# Patient Record
Sex: Female | Born: 1955 | Race: Black or African American | Hispanic: No | Marital: Married | Smoking: Former smoker
Health system: Southern US, Community
[De-identification: ages and names within clinical notes are randomized; demographics above are authoritative.]

## PROBLEM LIST (undated history)

## (undated) DIAGNOSIS — J449 Chronic obstructive pulmonary disease, unspecified: Secondary | ICD-10-CM

## (undated) DIAGNOSIS — J302 Other seasonal allergic rhinitis: Secondary | ICD-10-CM

## (undated) DIAGNOSIS — Z820 Family history of epilepsy and other diseases of the nervous system: Secondary | ICD-10-CM

## (undated) DIAGNOSIS — T783XXA Angioneurotic edema, initial encounter: Secondary | ICD-10-CM

## (undated) DIAGNOSIS — I10 Essential (primary) hypertension: Secondary | ICD-10-CM

## (undated) DIAGNOSIS — E785 Hyperlipidemia, unspecified: Secondary | ICD-10-CM

## (undated) HISTORY — DX: Hyperlipidemia, unspecified: E78.5

## (undated) HISTORY — DX: Chronic obstructive pulmonary disease, unspecified: J44.9

## (undated) HISTORY — DX: Other seasonal allergic rhinitis: J30.2

## (undated) HISTORY — DX: Family history of epilepsy and other diseases of the nervous system: Z82.0

## (undated) HISTORY — DX: Essential (primary) hypertension: I10

## (undated) HISTORY — DX: Angioneurotic edema, initial encounter: T78.3XXA

---

## 2007-01-09 IMAGING — CR DG CHEST 2V
2 series · 2 of 2 positions shown · non-contrast
Comparison: None

CLINICAL DATA: Pre-op for cervical fusion.
 CHEST - 2 VIEW:

[view not recorded (1 of 2)]
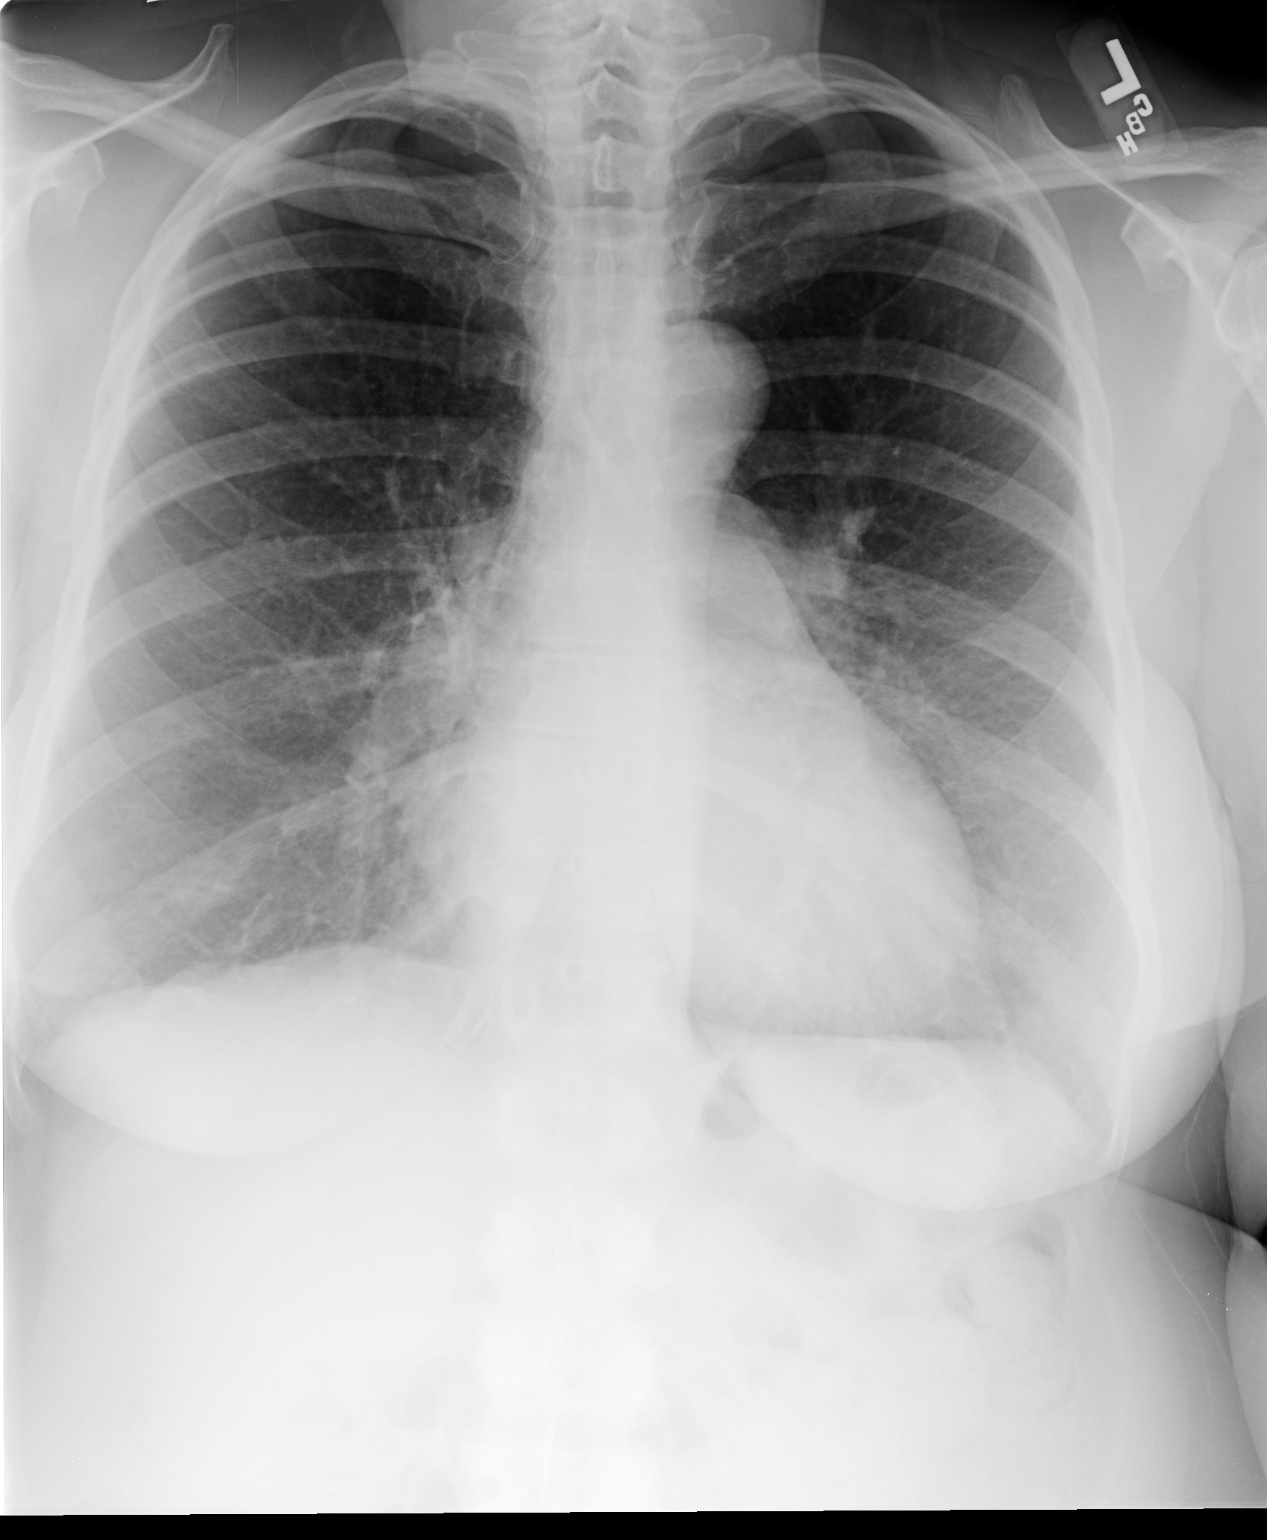

[view not recorded (2 of 2)]
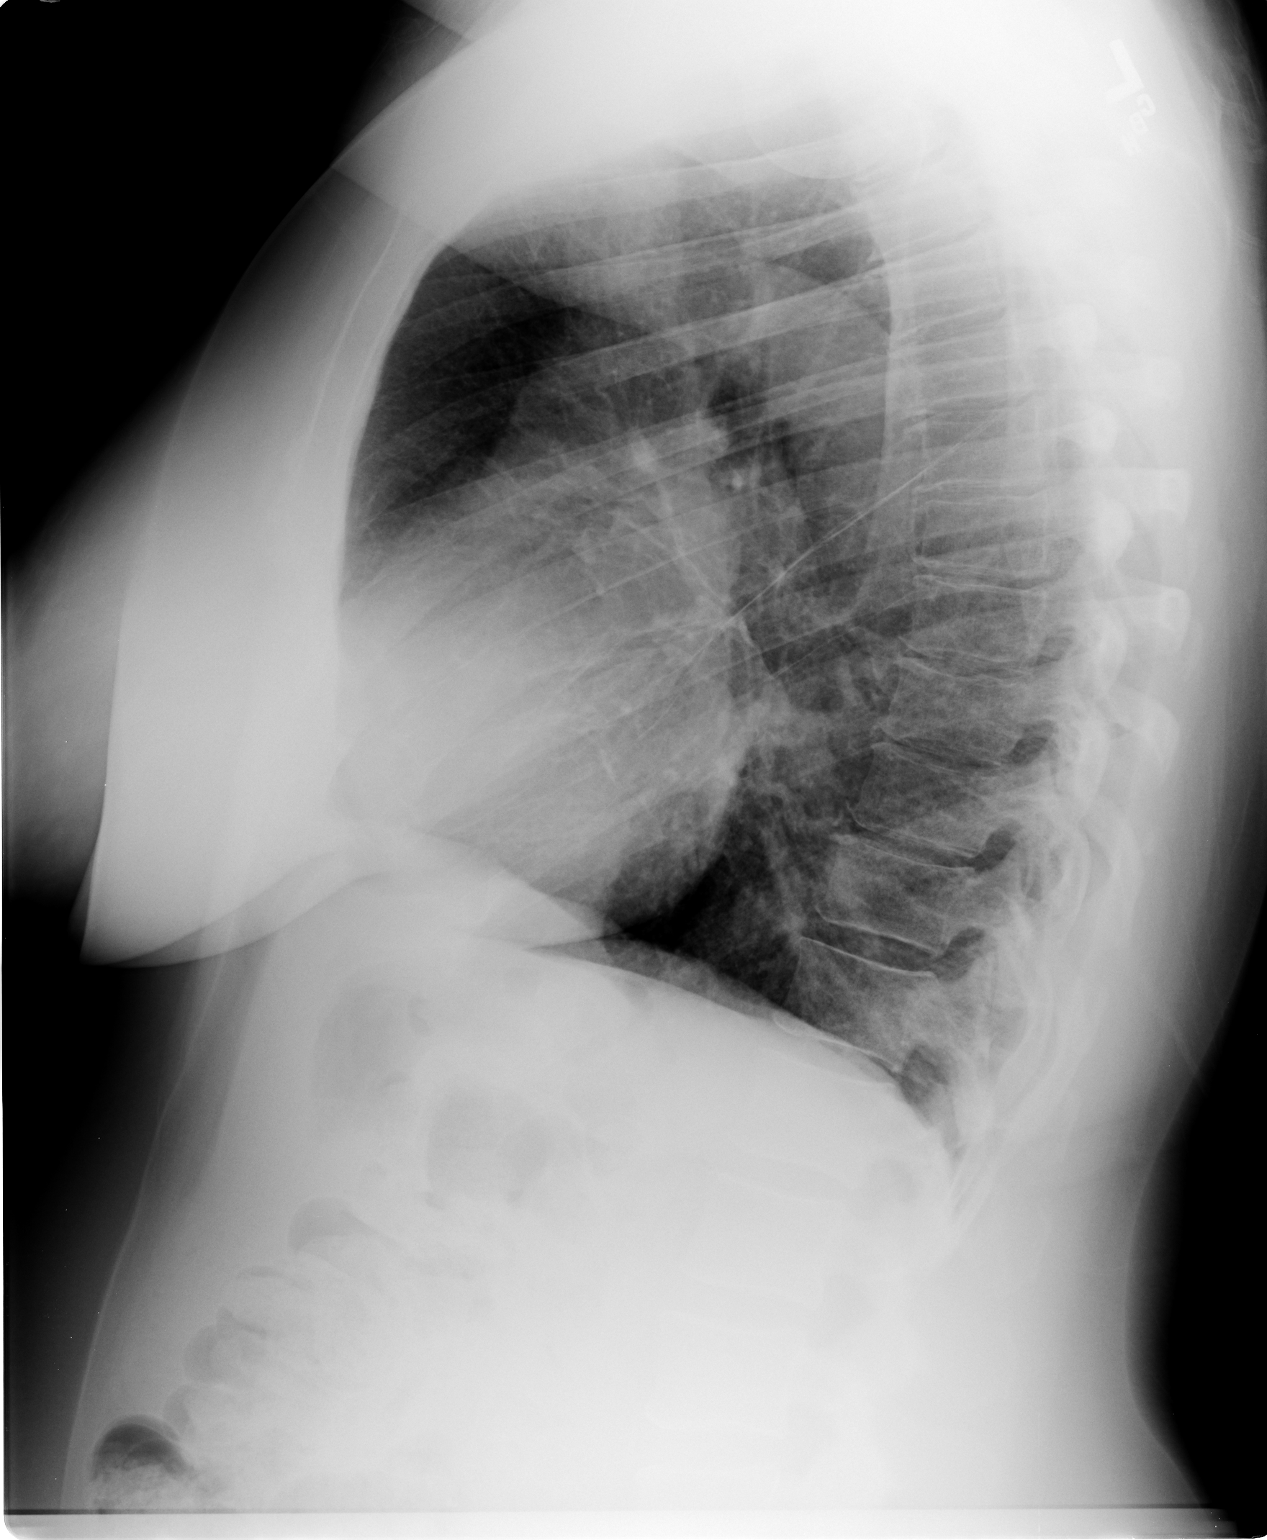

[2 of 2 positions shown; findings below may reference images not displayed]

FINDINGS: Heart size upper limits of normal. Mildly accentuated interstitial markings and mild hyperaeration.  No acute process.  
 Osseous structures intact.
IMPRESSION: 1.  Heart size upper limits of normal. 
 2.  Chronic lung changes - no acute process.

## 2007-01-11 ENCOUNTER — Inpatient Hospital Stay (HOSPITAL_COMMUNITY): Admission: RE | Admit: 2007-01-11 | Discharge: 2007-01-13 | Payer: Self-pay | Admitting: Neurosurgery

## 2007-01-11 IMAGING — CR DG CERVICAL SPINE 2 OR 3 VIEWS
1 series · 1 of 1 positions shown · non-contrast
Comparison: none

CLINICAL DATA: C4-5 ACDF.
 PORTABLE CERVICAL SPINE ? 2 VIEWS:
 Film # 1 reveals an instrument at the C4-5 interspace.  
 Film # 2 reveals an anterior plate at C4-5 with interbody bone plug at C4-5 in good position.

[view not recorded]
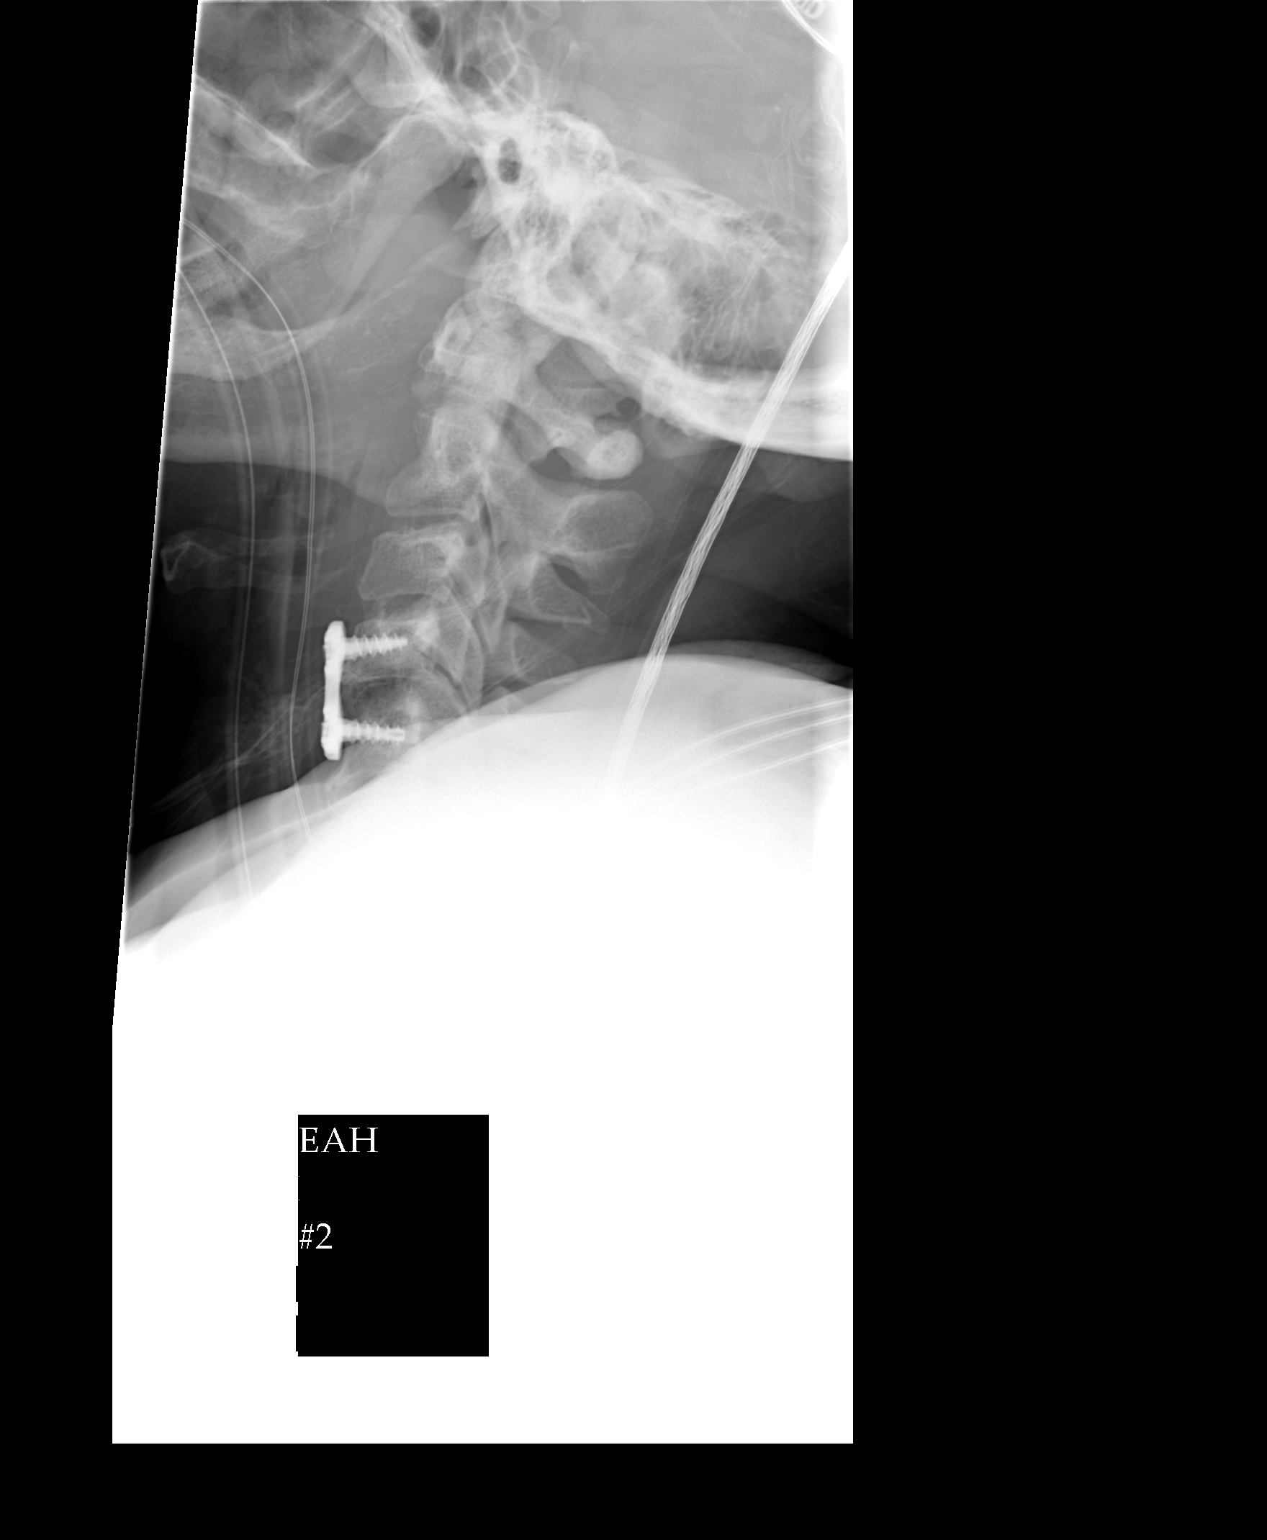

[1 of 1 positions shown; findings below may reference images not displayed]

IMPRESSION: Anterior discectomy and fusion at C4-5.

## 2007-01-11 IMAGING — CR DG CERVICAL SPINE 2 OR 3 VIEWS
1 series · 1 of 1 positions shown · non-contrast
Comparison: none

CLINICAL DATA: C4-5 ACDF.
 PORTABLE CERVICAL SPINE ? 2 VIEWS:
 Film # 1 reveals an instrument at the C4-5 interspace.  
 Film # 2 reveals an anterior plate at C4-5 with interbody bone plug at C4-5 in good position.

[view not recorded]
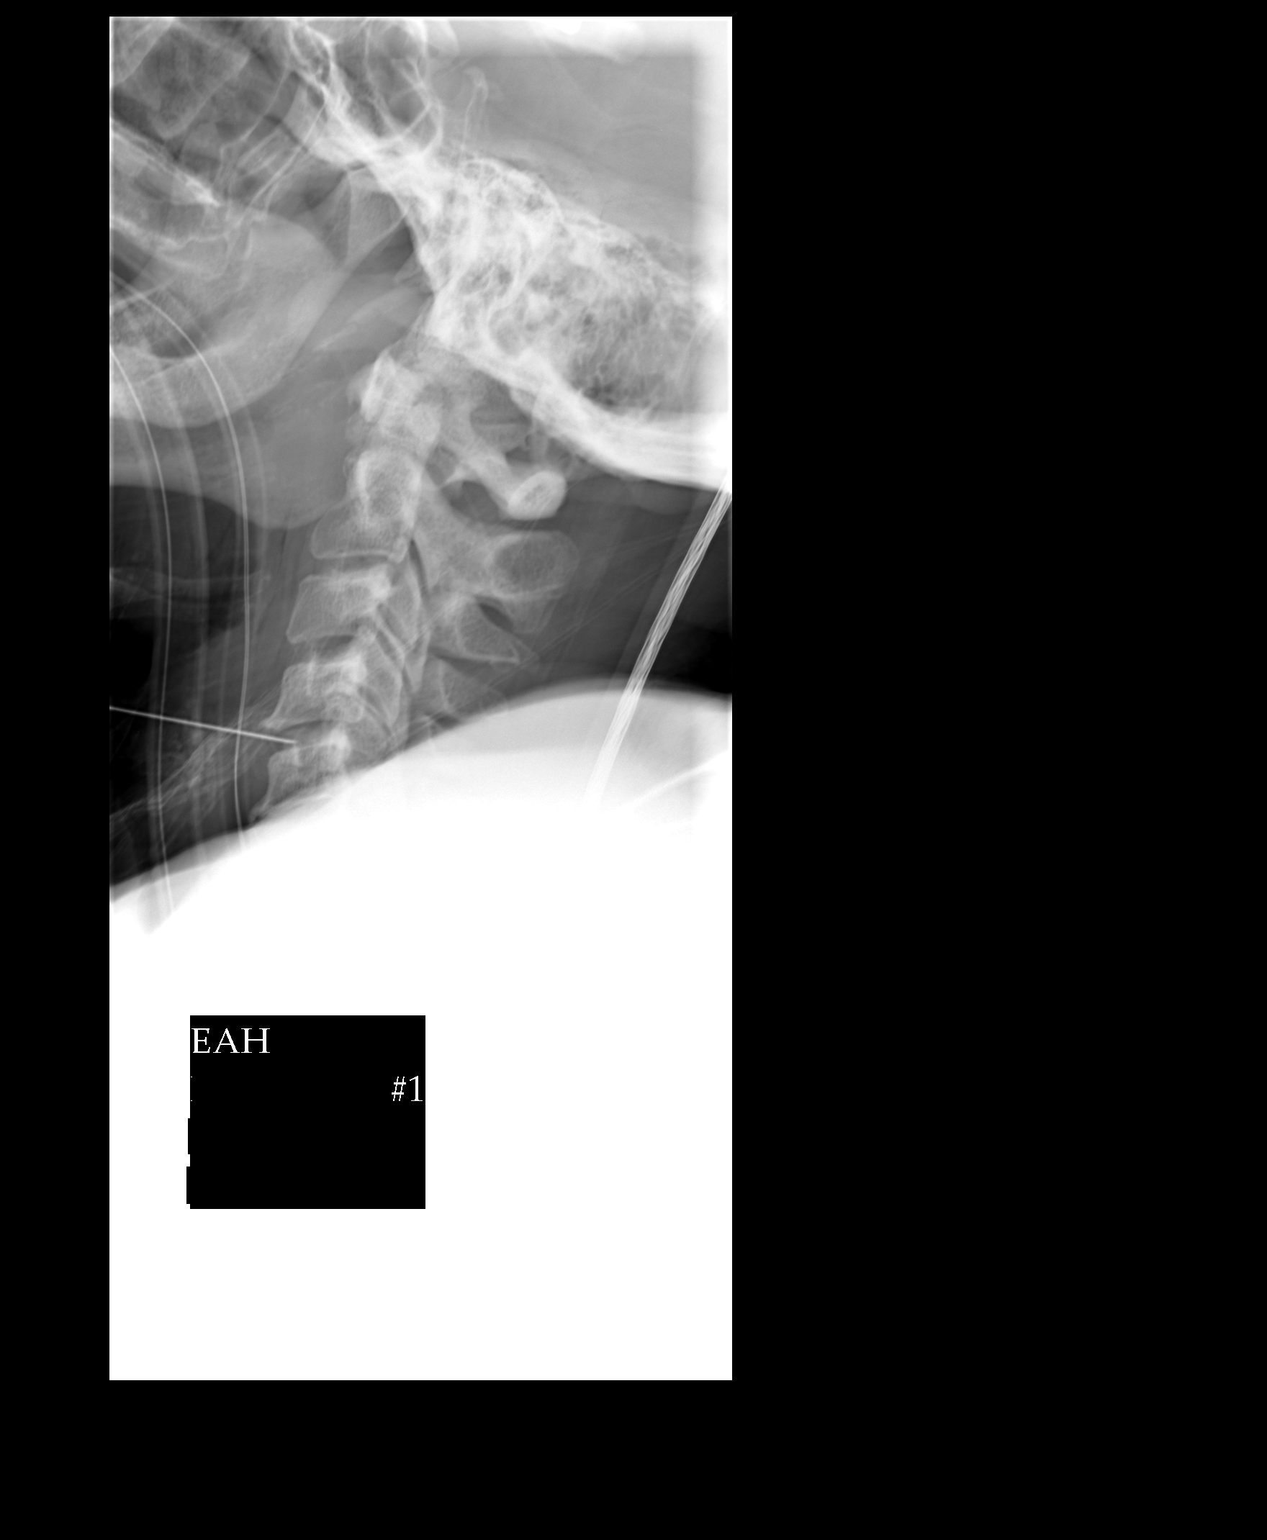

[1 of 1 positions shown; findings below may reference images not displayed]

IMPRESSION: Anterior discectomy and fusion at C4-5.

## 2014-01-11 ENCOUNTER — Encounter: Payer: Self-pay | Admitting: *Deleted

## 2014-01-11 ENCOUNTER — Encounter: Payer: Self-pay | Admitting: Emergency Medicine

## 2014-01-14 ENCOUNTER — Institutional Professional Consult (permissible substitution): Payer: Self-pay | Admitting: Emergency Medicine

## 2014-01-27 ENCOUNTER — Encounter: Payer: Self-pay | Admitting: Emergency Medicine

## 2014-01-27 ENCOUNTER — Ambulatory Visit (INDEPENDENT_AMBULATORY_CARE_PROVIDER_SITE_OTHER): Payer: Commercial Managed Care - PPO | Admitting: Emergency Medicine

## 2014-01-27 VITALS — BP 128/78 | HR 76 | Ht 69.0 in | Wt 212.0 lb

## 2014-01-27 DIAGNOSIS — R0602 Shortness of breath: Secondary | ICD-10-CM

## 2014-01-27 DIAGNOSIS — IMO0002 Reserved for concepts with insufficient information to code with codable children: Secondary | ICD-10-CM

## 2014-01-27 DIAGNOSIS — I2789 Other specified pulmonary heart diseases: Secondary | ICD-10-CM

## 2014-01-27 DIAGNOSIS — I2721 Secondary pulmonary arterial hypertension: Secondary | ICD-10-CM | POA: Insufficient documentation

## 2014-01-27 DIAGNOSIS — J449 Chronic obstructive pulmonary disease, unspecified: Secondary | ICD-10-CM | POA: Insufficient documentation

## 2014-01-27 DIAGNOSIS — R0902 Hypoxemia: Secondary | ICD-10-CM

## 2014-01-27 MED ORDER — AEROCHAMBER MV MISC: Status: DC

## 2014-01-27 NOTE — Assessment & Plan Note (Addendum)
By PFT and with emphysematous changes on CT scan. She has had trouble tolerating BD's due to UA irritation. Will try a spacer with Symbicort. Also try Spiriva with good teaching on how to use. Will walk her today and get her a more portable system. rov 1 - BD as above - walking oximetry - repeat imaging and PFT in the future - rov1

## 2014-01-27 NOTE — Addendum Note (Signed)
Addended by: Carlos American A on: 01/27/2014 04:01 PM   Modules accepted: Orders

## 2014-01-27 NOTE — Progress Notes (Signed)
Subjective:    Patient ID: Patricia Sampson, female    DOB: [birth year], 57 y.o.   MRN: [id number]  HPI 58 yo woman, former smoker 30 pk-yrs, hx of HTN, allergies. She was noted to be hypoxemic back in May'14. 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 CT scan 01/16/13 at [facility] showed emphysema, some associated scar.  She was tried on albuterol, alvesco, tudorza. She was given Anoro 12/30/13, only took it once and stopped it due to UA irritation.    Review of Systems  Constitutional: Negative for fever and unexpected weight change.  HENT: Positive for dental problem. Negative for congestion, ear pain, nosebleeds, postnasal drip, rhinorrhea, sinus pressure, sneezing, sore throat and trouble swallowing.   Eyes: Negative for redness and itching.  Respiratory: Positive for shortness of breath and wheezing. Negative for cough and chest tightness.   Cardiovascular: Negative for palpitations and leg swelling.  Gastrointestinal: Negative for nausea and vomiting.  Genitourinary: Negative for dysuria.  Musculoskeletal: Positive for joint swelling.       Pain   Skin: Negative for rash.  Neurological: Negative for headaches.  Hematological: Does not bruise/bleed easily.  Psychiatric/Behavioral: Negative for dysphoric mood. The patient is not nervous/anxious.    Past Medical History  Diagnosis Date  . Hyperlipidemia   . Hypertension   . COPD (chronic obstructive pulmonary disease)   . FHx: migraine headaches   . Seasonal allergies   . Angioedema      Family History  Problem Relation Age of Onset  . Diabetes Mellitus II Father   . Coronary artery disease Mother   . Hypertension Mother   . COPD Mother      History   Social History  . Marital Status: Married    Spouse Name: N/A    Number of Children: N/A  . Years of Education: N/A   Occupational History  . Not on file.   Social History Main Topics  . Smoking  status: Former Smoker -- 1.00 packs/day for 30 years    Types: Cigarettes    Quit date: 11/26/2000  . Smokeless tobacco: Not on file  . Alcohol Use: No  . Drug Use: No  . Sexual Activity: Not on file   Other Topics Concern  . Not on file   Social History Narrative  . No narrative on file     Allergies  Allergen Reactions  . Dyazide [Hydrochlorothiazide W-Triamterene]     hives  . Zestoretic [Lisinopril-Hydrochlorothiazide]     Swelling      Outpatient Prescriptions Prior to Visit  Medication Sig Dispense Refill  . amLODipine (NORVASC) 5 MG tablet Take 5 mg by mouth daily.      . chlorthalidone (HYGROTON) 25 MG tablet Take 25 mg by mouth daily.      . meloxicam (MOBIC) 15 MG tablet Take 15 mg by mouth daily.      . metoprolol succinate (TOPROL-XL) 50 MG 24 hr tablet Take 50 mg by mouth daily. Take with or immediately following a meal.      . Aclidinium Bromide (TUDORZA PRESSAIR) 400 MCG/ACT AEPB Inhale 1 puff into the lungs daily.       No facility-administered medications prior to visit.         Objective:   Physical Exam Filed Vitals:   01/27/14 1452  BP: 128/78  Pulse: 76  Height: _0  (1.753 m)  Weight: 212 lb (96.163 kg)  SpO2: 90%  Gen: Pleasant, well-nourished, in no distress,  normal affect  ENT: No lesions,  mouth clear,  oropharynx clear, no postnasal drip  Neck: No JVD, no TMG, no carotid bruits  Lungs: No use of accessory muscles, distant, clear without rales or rhonchi  Cardiovascular: RRR, heart sounds normal, no murmur or gallops, no peripheral edema  Musculoskeletal: No deformities, no cyanosis or clubbing  Neuro: alert, non focal  Skin: Warm, no lesions or rashes     Assessment & Plan:  COPD (chronic obstructive pulmonary disease) By PFT and with emphysematous changes on CT scan. She has had trouble tolerating BD's due to UA irritation. Will try a spacer with Symbicort. Also try Spiriva with good teaching on how to use. Will walk  her today and get her a more portable system. rov 1 - BD as above - walking oximetry - repeat imaging and PFT in the future - rov1

## 2014-01-27 NOTE — Patient Instructions (Signed)
Walking oximetry today We will get a more portable oxygen system for you to use with exertion We will start Symbicort 2 puffs twice a day through a spacer We will start Spiriva once a day  We will probably repeat your breathing tests and your imaging at some point in the future. Follow with Dr Lamonte Sakai in 1 month

## 2014-02-02 ENCOUNTER — Telehealth: Payer: Self-pay | Admitting: Emergency Medicine

## 2014-02-02 NOTE — Telephone Encounter (Signed)
fyi this pt refused her portable [place]

## 2014-02-02 NOTE — Telephone Encounter (Signed)
Message copied by Joellen Jersey on Tue Feb 02, 2014  2:27 PM ------      Message from: [name]      Created: Tue Feb 02, 2014  2:09 PM       Jesusita Oka.  This pt refused her portable concentrator delivery.      ----- Message -----         From: Joellen Jersey         Sent: 01/29/2014  12:03 PM           To: [name]            Dis you get this order       ------

## 2014-02-11 ENCOUNTER — Telehealth: Payer: Self-pay | Admitting: Emergency Medicine

## 2014-02-11 NOTE — Telephone Encounter (Signed)
Called spoke with patient who reported that her Spiriva capsule was mixed in with her other medications this morning and she accidentally swallowed the capsule.  Pt denies any symptoms -- no dyspnea, GI discomfort, throat closing, chest pain, vision changes.  She did not take another Spiriva capsule and inhale the contents.  Pt last seen by RB 3.4.15 for pulmonary consult for Dyspnea/COPD: Patient Instructions     Walking oximetry today  We will get a more portable oxygen system for you to use with exertion  We will start Symbicort 2 puffs twice a day through a spacer  We will start Spiriva once a day  We will probably repeat your breathing tests and your imaging at some point in the future.  Follow with Dr Lamonte Sakai in 1 month   As RB is scheduled 3pm Elink, will route to doc of the day.  Dr Annamaria Boots please advise, thank you.

## 2014-02-11 NOTE — Telephone Encounter (Signed)
Pt is aware of CY's response. Nothing further was needed.

## 2014-02-11 NOTE — Telephone Encounter (Signed)
lmtcb x1

## 2014-02-11 NOTE — Telephone Encounter (Signed)
No harm done. It isn't absorbed through stomach. She can go ahead and take her usual inhaled Spiriva dose.

## 2014-02-11 NOTE — Telephone Encounter (Signed)
Pt returned triage's call.  Patricia Sampson ° °

## 2014-03-01 ENCOUNTER — Encounter: Payer: Self-pay | Admitting: Emergency Medicine

## 2014-03-01 ENCOUNTER — Ambulatory Visit (INDEPENDENT_AMBULATORY_CARE_PROVIDER_SITE_OTHER): Payer: Commercial Managed Care - PPO | Admitting: Emergency Medicine

## 2014-03-01 VITALS — BP 126/88 | HR 67 | Ht 69.0 in | Wt 212.0 lb

## 2014-03-01 DIAGNOSIS — J449 Chronic obstructive pulmonary disease, unspecified: Secondary | ICD-10-CM

## 2014-03-01 NOTE — Assessment & Plan Note (Signed)
-  will continue o2 and symbicort, trial d/c spiriva to see if she misses it - rov 3 months. We will decide when to repeat PFT at that time.

## 2014-03-01 NOTE — Progress Notes (Signed)
   Subjective:    Patient ID: Patricia Sampson, female    DOB: Apr 13, 1956, 58 y.o.   MRN: [id number]  HPI 58 yo woman, former smoker 30 pk-yrs, hx of HTN, allergies. She was noted to be hypoxemic back in May'14. 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 CT scan 01/16/13 at [facility] showed emphysema, some associated scar.  She was tried on albuterol, alvesco, tudorza. She was given Anoro 12/30/13, only took it once and stopped it due to UA irritation.   ROV 03/01/14 -- follows up for COPD. UA irritation has made using BD's difficult. She is using symbicort via a spacer, spiriva. She is not sure the spiriva is helpful. She is using O2 with most exertion and it helps.     Review of Systems  Constitutional: Negative for fever and unexpected weight change.  HENT: Positive for dental problem. Negative for congestion, ear pain, nosebleeds, postnasal drip, rhinorrhea, sinus pressure, sneezing, sore throat and trouble swallowing.   Eyes: Negative for redness and itching.  Respiratory: Positive for shortness of breath and wheezing. Negative for cough and chest tightness.   Cardiovascular: Negative for palpitations and leg swelling.  Gastrointestinal: Negative for nausea and vomiting.  Genitourinary: Negative for dysuria.  Musculoskeletal: Positive for joint swelling.       Pain   Skin: Negative for rash.  Neurological: Negative for headaches.  Hematological: Does not bruise/bleed easily.  Psychiatric/Behavioral: Negative for dysphoric mood. The patient is not nervous/anxious.        Objective:   Physical Exam Filed Vitals:   03/01/14 1500  BP: 126/88  Pulse: 67  Height: _0  (1.753 m)  Weight: 96.163 kg (212 lb)  SpO2: 98%   Gen: Pleasant, well-nourished, in no distress,  normal affect  ENT: No lesions,  mouth clear,  oropharynx clear, no postnasal drip  Neck: No JVD, no TMG, no carotid bruits  Lungs: No use of  accessory muscles, distant, clear without rales or rhonchi  Cardiovascular: RRR, heart sounds normal, no murmur or gallops, no peripheral edema  Musculoskeletal: No deformities, no cyanosis or clubbing  Neuro: alert, non focal  Skin: Warm, no lesions or rashes     Assessment & Plan:  COPD (chronic obstructive pulmonary disease) - will continue o2 and symbicort, trial d/c spiriva to see if she misses it - rov 3 months. We will decide when to repeat PFT at that time.

## 2014-03-01 NOTE — Patient Instructions (Signed)
Please continue Symbicort as you are taking it Stop Spiriva for now. Pay attention to your symptoms so we can decide if you miss the Spiriva.  Continue your oxygen at 3l/min with exertion.  Follow with Dr Lamonte Sakai in 3 months or sooner if you have any problems.

## 2015-04-26 ENCOUNTER — Ambulatory Visit (INDEPENDENT_AMBULATORY_CARE_PROVIDER_SITE_OTHER): Payer: Commercial Managed Care - PPO | Admitting: Emergency Medicine

## 2015-04-26 ENCOUNTER — Encounter: Payer: Self-pay | Admitting: Emergency Medicine

## 2015-04-26 VITALS — BP 140/88 | HR 90 | Ht 69.0 in | Wt 198.0 lb

## 2015-04-26 DIAGNOSIS — J449 Chronic obstructive pulmonary disease, unspecified: Secondary | ICD-10-CM

## 2015-04-26 MED ORDER — TIOTROPIUM BROMIDE MONOHYDRATE 1.25 MCG/ACT IN AERS: 2.0000 | INHALATION_SPRAY | Freq: Every day | RESPIRATORY_TRACT | Status: DC

## 2015-04-26 NOTE — Patient Instructions (Addendum)
We will perform spirometry today Please continue your oxygenation have been using it Your underlying lung disease does put you at increased risk for any surgery or general anesthesia. These risks include a prolonged hospitalization, prolonged time on the mechanical ventilator, or even a small increase in the chances of dying. This said if the benefits of an operation outweigh these risks then it would be appropriate to proceed. Please discuss this with Dr. Adin Hector.  We will investigate as to whether an alternative to your current inhaled medication called Spiriva Respimat will work for you. This has been sent to your pharmacy. Follow with Dr Lamonte Sakai in 3 months or sooner if you have any problems.

## 2015-04-26 NOTE — Progress Notes (Signed)
   Subjective:    Patient ID: Patricia Sampson, female    DOB: Nov 06, 1956, 59 y.o.   MRN: [id number]  HPI 59 yo woman, former smoker 30 pk-yrs, hx of HTN, allergies. She was noted to be hypoxemic back in May'14. 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 CT scan 01/16/13 at [facility] showed emphysema, some associated scar.  She was tried on albuterol, alvesco, tudorza. She was given Anoro 12/30/13, only took it once and stopped it due to UA irritation.   ROV 03/01/14 -- follows up for COPD. UA irritation has made using BD's difficult. She is using symbicort via a spacer, spiriva. She is not sure the spiriva is helpful. She is using O2 with most exertion and it helps.   ROV 58/31/16 -- follow-up visit for her severe COPD. She had pulmonary function testing in the past that showed severe airflow limitation.  She wears O2 3L/min. Last time we stopped spiriva to see if she would miss. She has tolerated well, although her PFT (reviewed by me today) suggest that she would benefit from LAMA and LABA. No flares since last time. No cough or wheeze.      Review of Systems  Constitutional: Negative for fever and unexpected weight change.  HENT: Negative for congestion, dental problem, ear pain, nosebleeds, postnasal drip, rhinorrhea, sinus pressure, sneezing, sore throat and trouble swallowing.   Eyes: Negative for redness and itching.  Respiratory: Negative for cough, chest tightness, shortness of breath and wheezing.   Cardiovascular: Negative for palpitations and leg swelling.  Gastrointestinal: Negative for nausea and vomiting.  Genitourinary: Negative for dysuria.  Musculoskeletal: Positive for joint swelling and gait problem.       Pain   Skin: Negative for rash.  Neurological: Negative for headaches.  Hematological: Does not bruise/bleed easily.  Psychiatric/Behavioral: Negative for dysphoric mood. The patient is not nervous/anxious.         Objective:   Physical Exam Filed Vitals:   04/26/15 1601  BP: 140/88  Pulse: 90  Height: _0  (1.753 m)  Weight: 198 lb (89.812 kg)  SpO2: 91%   Gen: Pleasant, well-nourished, in no distress,  normal affect  ENT: No lesions,  mouth clear,  oropharynx clear, no postnasal drip  Neck: No JVD, no TMG, no carotid bruits  Lungs: No use of accessory muscles, distant, clear without rales or rhonchi  Cardiovascular: RRR, heart sounds normal, no murmur or gallops, no peripheral edema  Musculoskeletal: No deformities, no cyanosis or clubbing  Neuro: alert, non focal  Skin: Warm, no lesions or rashes     Assessment & Plan:  COPD (chronic obstructive pulmonary disease) We will perform spirometry today Please continue your oxygenation have been using it Your underlying lung disease does put you at increased risk for any surgery or general anesthesia. These risks include a prolonged hospitalization, prolonged time on the mechanical ventilator, or even a small increase in the chances of dying. This said if the benefits of an operation outweigh these risks then it would be appropriate to proceed. Please discuss this with Dr. Adin Hector.  We will investigate as to whether an alternative to your current inhaled medication called Beau Fanny is covered by your insurance.  Follow with Dr Lamonte Sakai in 3 months or sooner if you have any problems.

## 2015-04-26 NOTE — Assessment & Plan Note (Signed)
We will perform spirometry today Please continue your oxygenation have been using it Your underlying lung disease does put you at increased risk for any surgery or general anesthesia. These risks include a prolonged hospitalization, prolonged time on the mechanical ventilator, or even a small increase in the chances of dying. This said if the benefits of an operation outweigh these risks then it would be appropriate to proceed. Please discuss this with Dr. Adin Hector.  We will investigate as to whether an alternative to your current inhaled medication called Beau Fanny is covered by your insurance.  Follow with Dr Lamonte Sakai in 3 months or sooner if you have any problems.

## 2015-10-12 ENCOUNTER — Encounter: Payer: Self-pay | Admitting: Emergency Medicine

## 2015-10-12 ENCOUNTER — Ambulatory Visit (INDEPENDENT_AMBULATORY_CARE_PROVIDER_SITE_OTHER): Payer: Commercial Managed Care - PPO | Admitting: Emergency Medicine

## 2015-10-12 VITALS — BP 140/82 | HR 90 | Wt 206.0 lb

## 2015-10-12 DIAGNOSIS — J449 Chronic obstructive pulmonary disease, unspecified: Secondary | ICD-10-CM | POA: Diagnosis not present

## 2015-10-12 MED ORDER — TIOTROPIUM BROMIDE MONOHYDRATE 1.25 MCG/ACT IN AERS: 2.0000 | INHALATION_SPRAY | Freq: Every day | RESPIRATORY_TRACT | Status: DC

## 2015-10-12 NOTE — Assessment & Plan Note (Addendum)
She has declined some since stopping BD's, although her functional capacity has benefited some (less Limited by her hip discomfort). I believe we need to restart her on bronchodilators and given the cost we will choose one agent. She will restart Spiriva, hold off on restarting Symbicort at this time.  Please restart Spiriva respimat 2 sprays once a day  We will not restart Symbicort at this time Please use albuterol 2 puffs up to every 4 hours if needed for shortness of breath Continue your oxygen as you have been using it, particularly with all exertion Follow with Dr Lamonte Sakai in 3 months or sooner if you have any problems.

## 2015-10-12 NOTE — Patient Instructions (Addendum)
Please restart Spiriva respimat 2 sprays once a day  We will not restart Symbicort at this time Please use albuterol 2 puffs up to every 4 hours if needed for shortness of breath Continue your oxygen as you have been using it, particularly with all exertion Follow with Dr Lamonte Sakai in 3 months or sooner if you have any problems.

## 2015-10-12 NOTE — Progress Notes (Signed)
Subjective:    Patient ID: Patricia Sampson, female    DOB: Nov 22, 1956, 59 y.o.   MRN: [id number]  HPI 59 yo woman, former smoker 30 pk-yrs, hx of HTN, allergies. She was noted to be hypoxemic back in May'14. 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 CT scan 01/16/13 at [facility] showed emphysema, some associated scar.  She was tried on albuterol, alvesco, tudorza. She was given Anoro 12/30/13, only took it once and stopped it due to UA irritation.   ROV 03/01/14 -- follows up for COPD. UA irritation has made using BD's difficult. She is using symbicort via a spacer, spiriva. She is not sure the spiriva is helpful. She is using O2 with most exertion and it helps.   ROV 04/26/15 -- follow-up visit for her severe COPD. She had pulmonary function testing in the past that showed severe airflow limitation.  She wears O2 3L/min. Last time we stopped spiriva to see if she would miss. She has tolerated well, although her PFT (reviewed by me today) suggest that she would benefit from LAMA and LABA. No flares since last time. No cough or wheeze.     ROV 10/12/15 -- follow-up visit for severe COPD, chronic hypoxemic respiratory failure. Since her last visit she has had to stop her Spiriva Respimat and Symbicort due to cost. She has been off of these since July. She has noticed that her breathing has worsened. Her functional capacity is not as badly impaired because she has also been doing physical therapy after a hip surgery.    CAT Score 10/12/2015  Total CAT Score 10    Review of Systems  Constitutional: Negative for fever and unexpected weight change.  HENT: Negative for congestion, dental problem, ear pain, nosebleeds, postnasal drip, rhinorrhea, sinus pressure, sneezing, sore throat and trouble swallowing.   Eyes: Negative for redness and itching.  Respiratory: Negative for cough, chest tightness, shortness of breath and wheezing.    Cardiovascular: Negative for palpitations and leg swelling.  Gastrointestinal: Negative for nausea and vomiting.  Genitourinary: Negative for dysuria.  Musculoskeletal: Positive for joint swelling and gait problem.       Pain   Skin: Negative for rash.  Neurological: Negative for headaches.  Hematological: Does not bruise/bleed easily.  Psychiatric/Behavioral: Negative for dysphoric mood. The patient is not nervous/anxious.        Objective:   Physical Exam Filed Vitals:   10/12/15 1656  BP: 140/82  Pulse: 90  Weight: 206 lb (93.441 kg)  SpO2: 90%   Gen: Pleasant, well-nourished, in no distress,  normal affect  ENT: No lesions,  mouth clear,  oropharynx clear, no postnasal drip  Neck: No JVD, no TMG, no carotid bruits  Lungs: No use of accessory muscles, distant, clear without rales or rhonchi  Cardiovascular: RRR, heart sounds normal, no murmur or gallops, no peripheral edema  Musculoskeletal: No deformities, no cyanosis or clubbing  Neuro: alert, non focal  Skin: Warm, no lesions or rashes     Assessment & Plan:  COPD (chronic obstructive pulmonary disease) She has declined some since stopping BD's, although her functional capacity has benefited some (less Limited by her hip discomfort). I believe we need to restart her on bronchodilators and given the cost we will choose one agent. She will restart Spiriva, hold off on restarting Symbicort at this time.  Please restart Spiriva respimat 2 sprays once a day  We will not restart Symbicort at  this time Please use albuterol 2 puffs up to every 4 hours if needed for shortness of breath Continue your oxygen as you have been using it, particularly with all exertion Follow with Dr Lamonte Sakai in 3 months or sooner if you have any problems.

## 2016-01-19 ENCOUNTER — Ambulatory Visit: Payer: Commercial Managed Care - PPO | Admitting: Emergency Medicine

## 2016-02-07 ENCOUNTER — Encounter: Payer: Self-pay | Admitting: Emergency Medicine

## 2016-02-07 ENCOUNTER — Ambulatory Visit (INDEPENDENT_AMBULATORY_CARE_PROVIDER_SITE_OTHER): Payer: Commercial Managed Care - PPO | Admitting: Emergency Medicine

## 2016-02-07 VITALS — BP 134/82 | HR 91 | Ht 69.0 in | Wt 202.0 lb

## 2016-02-07 DIAGNOSIS — J449 Chronic obstructive pulmonary disease, unspecified: Secondary | ICD-10-CM

## 2016-02-07 DIAGNOSIS — R0902 Hypoxemia: Secondary | ICD-10-CM | POA: Diagnosis not present

## 2016-02-07 NOTE — Patient Instructions (Signed)
Please continue Spiriva 2 puffs daily We will try restarting Symbicort 2 puffs twice a day. Please gargle and rinse after using this medication.  Keep albuterol available to use as needed Continue oxygen. We will investigate getting a portable O2 concentrator that will deliver continuous flow.  Follow with Dr Lamonte Sakai in 4 months or sooner if you have any problems.

## 2016-02-07 NOTE — Assessment & Plan Note (Signed)
She needs a portable oxygen concentrator that will provide continuous flow because she has not tolerated pulse flow in the past. We will work on arranging this

## 2016-02-07 NOTE — Assessment & Plan Note (Signed)
She is clearly benefited from the reinitiation of Spiriva. We will try to add back Symbicort as well to see if she benefits further. She'll call us to let us know if she wants to continue the Symbicort after a trial.

## 2016-02-07 NOTE — Progress Notes (Signed)
Subjective:    Patient ID: Patricia Sampson, female    DOB: 05-Jun-1956, 60 y.o.   MRN: [id number]  HPI 60 yo woman, former smoker 30 pk-yrs, hx of HTN, allergies. She was noted to be hypoxemic back in May'14. 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 CT scan 01/16/13 at [facility] showed emphysema, some associated scar.  She was tried on albuterol, alvesco, tudorza. She was given Anoro 12/30/13, only took it once and stopped it due to UA irritation.   ROV 03/01/14 -- follows up for COPD. UA irritation has made using BD's difficult. She is using symbicort via a spacer, spiriva. She is not sure the spiriva is helpful. She is using O2 with most exertion and it helps.   ROV 04/26/15 -- follow-up visit for her severe COPD. She had pulmonary function testing in the past that showed severe airflow limitation.  She wears O2 3L/min. Last time we stopped spiriva to see if she would miss. She has tolerated well, although her PFT (reviewed by me today) suggest that she would benefit from LAMA and LABA. No flares since last time. No cough or wheeze.     ROV 10/12/15 -- follow-up visit for severe COPD, chronic hypoxemic respiratory failure. Since her last visit she has had to stop her Spiriva Respimat and Symbicort due to cost. She has been off of these since July. She has noticed that her breathing has worsened. Her functional capacity is not as badly impaired because she has also been doing physical therapy after a hip surgery.    ROV 02/07/16 -- patient has a history of severe COPD and chronic hypoxemic respiratory failure. At her last visit she was off her inhaled medications so we restarted her Spiriva respimat. She has not needed albuterol very often on the Spiriva.  She wears O2 at 3L/min continuous. She would like a POC but most of these devices would be pulsed flow.  I am concerned that we need continuous flow instead.  CAT Score 10/12/2015  Total CAT Score 10    Review of Systems  Constitutional: Negative for fever and unexpected weight change.  HENT: Negative for congestion, dental problem, ear pain, nosebleeds, postnasal drip, rhinorrhea, sinus pressure, sneezing, sore throat and trouble swallowing.   Eyes: Negative for redness and itching.  Respiratory: Negative for cough, chest tightness, shortness of breath and wheezing.   Cardiovascular: Negative for palpitations and leg swelling.  Gastrointestinal: Negative for nausea and vomiting.  Genitourinary: Negative for dysuria.  Musculoskeletal: Positive for joint swelling and gait problem.       Pain   Skin: Negative for rash.  Neurological: Negative for headaches.  Hematological: Does not bruise/bleed easily.  Psychiatric/Behavioral: Negative for dysphoric mood. The patient is not nervous/anxious.        Objective:   Physical Exam Filed Vitals:   02/07/16 1629 02/07/16 1630  BP:  134/82  Pulse:  91  Height: _0  (1.753 m)   Weight: 202 lb (91.627 kg)   SpO2:  96%   Gen: Pleasant, well-nourished, in no distress,  normal affect  ENT: No lesions,  mouth clear,  oropharynx  clear, no postnasal drip  Neck: No JVD, no TMG, no carotid bruits  Lungs: No use of accessory muscles, distant, clear without rales or rhonchi  Cardiovascular: RRR, heart sounds normal, no murmur or gallops, no peripheral edema  Musculoskeletal: No deformities, no cyanosis or clubbing  Neuro: alert, non focal  Skin: Warm, no lesions or rashes     Assessment & Plan:  COPD (chronic obstructive pulmonary disease) She is clearly benefited from the reinitiation of Spiriva. We will try to add back Symbicort as well to see if she benefits further. She'll call us to let us know if she wants to continue the Symbicort after a trial.   Hypoxemia She needs a portable oxygen concentrator that will provide continuous flow because she has not tolerated pulse flow in the past. We will work on arranging this

## 2016-02-13 ENCOUNTER — Telehealth: Payer: Self-pay | Admitting: Emergency Medicine

## 2016-02-13 NOTE — Telephone Encounter (Signed)
LM for pt x 1

## 2016-02-14 NOTE — Telephone Encounter (Signed)
Left message for patient to call back.  

## 2016-02-15 NOTE — Telephone Encounter (Signed)
LMTCB

## 2016-02-16 ENCOUNTER — Telehealth: Payer: Self-pay | Admitting: Emergency Medicine

## 2016-02-16 NOTE — Telephone Encounter (Signed)
LM for patient  Will close message per triage protocol d/t several attempts to reach the patient and messages left.

## 2016-02-16 NOTE — Telephone Encounter (Signed)
LM for pt that POC order was placed and to contact us if she needs anything further.

## 2016-06-11 ENCOUNTER — Encounter: Payer: Self-pay | Admitting: Emergency Medicine

## 2016-06-11 ENCOUNTER — Ambulatory Visit (INDEPENDENT_AMBULATORY_CARE_PROVIDER_SITE_OTHER): Payer: Commercial Managed Care - PPO | Admitting: Emergency Medicine

## 2016-06-11 VITALS — BP 132/96 | HR 100 | Ht 69.0 in | Wt 197.0 lb

## 2016-06-11 DIAGNOSIS — J449 Chronic obstructive pulmonary disease, unspecified: Secondary | ICD-10-CM | POA: Diagnosis not present

## 2016-06-11 NOTE — Patient Instructions (Signed)
Please continue your spiriva Stop symbicort for now Continue your oxygen at 3L/min continuous flow at all times.  Start using albuterol 2 puffs up to every 4 hours if needed for shortness of breath.  Follow with Dr Lamonte Sakai in 4 months or sooner if you have any problems.

## 2016-06-11 NOTE — Assessment & Plan Note (Signed)
Please continue your spiriva Stop symbicort for now Continue your oxygen at 3L/min continuous flow at all times.  Start using albuterol 2 puffs up to every 4 hours if needed for shortness of breath.  Follow with Dr Byrum in 4 months or sooner if you have any problems.   

## 2016-06-11 NOTE — Progress Notes (Signed)
Subjective:    Patient ID: Patricia Sampson, female    DOB: 27-Dec-1955, 60 y.o.   MRN: [id number]  HPI 60 yo woman, former smoker 30 pk-yrs, hx of HTN, allergies. She was noted to be hypoxemic back in May'14. 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 CT scan 01/16/13 at [facility] showed emphysema, some associated scar.  She was tried on albuterol, alvesco, tudorza. She was given Anoro 12/30/13, only took it once and stopped it due to UA irritation.   ROV 03/01/14 -- follows up for COPD. UA irritation has made using BD's difficult. She is using symbicort via a spacer, spiriva. She is not sure the spiriva is helpful. She is using O2 with most exertion and it helps.   ROV 04/26/15 -- follow-up visit for her severe COPD. She had pulmonary function testing in the past that showed severe airflow limitation.  She wears O2 3L/min. Last time we stopped spiriva to see if she would miss. She has tolerated well, although her PFT (reviewed by me today) suggest that she would benefit from LAMA and LABA. No flares since last time. No cough or wheeze.     ROV 10/12/15 -- follow-up visit for severe COPD, chronic hypoxemic respiratory failure. Since her last visit she has had to stop her Spiriva Respimat and Symbicort due to cost. She has been off of these since July. She has noticed that her breathing has worsened. Her functional capacity is not as badly impaired because she has also been doing physical therapy after a hip surgery.    ROV 02/07/16 -- patient has a history of severe COPD and chronic hypoxemic respiratory failure. At her last visit she was off her inhaled medications so we restarted her Spiriva respimat. She has not needed albuterol very often on the Spiriva.  She wears O2 at 3L/min continuous. She would like a POC but most of these devices would be pulsed flow.  I am concerned that we need continuous flow instead.  ROV 59/17/17 -- follow-up visit for chronic hypoxemic rest for a failure in the setting of severe COPD. At her last visit we tried adding back Symbicort to her existing Spiriva. We also changed her oxygen from pulsed flow to continuous flow, currently on 3L/min. She has benefited from the change to continuous flow. She is unclear that her Symbicort was beneficial. She does not have an albuterol inhaler. No flares, no hospitalizations. She did have a URI but she did not need extra meds.                                                                                                                                                                                                                                                                                                                                                                    CAT Score 10/12/2015  Total CAT Score 10    Review of Systems  Constitutional: Negative for fever and unexpected weight change.  HENT: Negative for congestion, dental problem, ear pain, nosebleeds, postnasal drip, rhinorrhea, sinus pressure, sneezing, sore throat and trouble swallowing.   Eyes: Negative for redness and itching.  Respiratory: Negative for cough, chest tightness, shortness of breath and wheezing.   Cardiovascular: Negative for palpitations and leg swelling.  Gastrointestinal: Negative for nausea and vomiting.  Genitourinary: Negative for dysuria.  Musculoskeletal: Positive for joint swelling and gait problem.       Pain   Skin: Negative for rash.  Neurological: Negative for headaches.  Hematological: Does  not bruise/bleed easily.  Psychiatric/Behavioral: Negative for dysphoric mood. The patient is not nervous/anxious.        Objective:   Physical Exam Filed Vitals:   06/11/16 1507  BP: 132/96  Pulse: 100  Height: _0  (1.753 m)  Weight: 89.359 kg (197 lb)  SpO2: 94%   Gen: Pleasant, well-nourished, in no distress,  normal affect  ENT: No lesions,  mouth clear,  oropharynx clear, no postnasal drip  Neck: No JVD, no TMG, no carotid bruits  Lungs: No use of accessory muscles, distant, clear without rales or rhonchi  Cardiovascular: RRR, heart sounds normal, no murmur or gallops, no peripheral edema  Musculoskeletal: No deformities, no cyanosis or clubbing  Neuro: alert, non focal  Skin: Warm, no lesions or rashes     Assessment & Plan:  COPD (chronic obstructive pulmonary disease) Please continue your spiriva Stop symbicort for now Continue your oxygen at 3L/min continuous flow at all times.  Start using albuterol 2 puffs up to every 4 hours if needed for shortness of breath.  Follow with Dr Lamonte Sakai in 4 months or sooner if you have any problems.   Baltazar Apo, MD, PhD 06/11/2016, 3:26 PM [facility] [phone] or if no answer [phone]

## 2016-06-27 ENCOUNTER — Telehealth: Payer: Self-pay | Admitting: Emergency Medicine

## 2016-06-27 MED ORDER — TIOTROPIUM BROMIDE MONOHYDRATE 1.25 MCG/ACT IN AERS: 2.0000 | INHALATION_SPRAY | Freq: Every day | RESPIRATORY_TRACT | 3 refills | Status: DC

## 2016-06-27 NOTE — Telephone Encounter (Signed)
[facility] requesting a 90 day refill on pt's spiriva.  This has been sent.  Nothing further needed.

## 2016-10-15 ENCOUNTER — Ambulatory Visit (INDEPENDENT_AMBULATORY_CARE_PROVIDER_SITE_OTHER): Payer: Commercial Managed Care - PPO | Admitting: Emergency Medicine

## 2016-10-15 ENCOUNTER — Encounter: Payer: Self-pay | Admitting: Emergency Medicine

## 2016-10-15 DIAGNOSIS — J449 Chronic obstructive pulmonary disease, unspecified: Secondary | ICD-10-CM

## 2016-10-15 DIAGNOSIS — R0902 Hypoxemia: Secondary | ICD-10-CM | POA: Diagnosis not present

## 2016-10-15 NOTE — Progress Notes (Signed)
Subjective:    Patient ID: Patricia Sampson, female    DOB: 07-08-1956, 60 y.o.   MRN: [id number]  HPI 60 yo woman, former smoker 30 pk-yrs, hx of HTN, allergies. She was noted to be hypoxemic back in May'14. 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 CT scan 01/16/13 at [facility] showed emphysema, some associated scar.  She was tried on albuterol, alvesco, tudorza. She was given Anoro 12/30/13, only took it once and stopped it due to UA irritation.   ROV 03/01/14 -- follows up for COPD. UA irritation has made using BD's difficult. She is using symbicort via a spacer, spiriva. She is not sure the spiriva is helpful. She is using O2 with most exertion and it helps.   ROV 04/26/15 -- follow-up visit for her severe COPD. She had pulmonary function testing in the past that showed severe airflow limitation.  She wears O2 3L/min. Last time we stopped spiriva to see if she would miss. She has tolerated well, although her PFT (reviewed by me today) suggest that she would benefit from LAMA and LABA. No flares since last time. No cough or wheeze.     ROV 10/12/15 -- follow-up visit for severe COPD, chronic hypoxemic respiratory failure. Since her last visit she has had to stop her Spiriva Respimat and Symbicort due to cost. She has been off of these since July. She has noticed that her breathing has worsened. Her functional capacity is not as badly impaired because she has also been doing physical therapy after a hip surgery.    ROV 02/07/16 -- patient has a history of severe COPD and chronic hypoxemic respiratory failure. At her last visit she was off her inhaled medications so we restarted her Spiriva respimat. She has not needed albuterol very often on the Spiriva.  She wears O2 at 3L/min continuous. She would like a POC but most of these devices would be pulsed flow.  I am concerned that we need continuous flow instead.  ROV 06/11/16 -- follow-up visit for chronic hypoxemic rest for a failure in the setting of severe COPD. At her last visit we tried adding back Symbicort to her existing Spiriva. We also changed her oxygen from pulsed flow to continuous flow, currently on 3L/min. She has benefited from the change to continuous flow. She is unclear that her Symbicort was beneficial. She does not have an albuterol inhaler. No flares, no hospitalizations. She did have a URI but she did not need extra meds.              ROV 10/15/16 -- follow up today for COPD, chronic hypoxemia (3L/min continuous flow with exertion and while sleeping). We continued her Spiriva, stopped symbicort last time as it had not seemed to change her breathing. She has been doing well. Does not feel that her breathing or exercise tolerance has declined any since last time. She does not flare frequently.                                                                                                                                                                                                                                                                                                                     CAT Score 10/12/2015  Total CAT Score 10    Review of Systems  Constitutional: Negative for fever and unexpected weight change.  HENT: Negative for congestion, dental problem, ear pain, nosebleeds, postnasal drip, rhinorrhea, sinus pressure, sneezing, sore throat and trouble swallowing.   Eyes: Negative for redness and itching.  Respiratory: Negative for cough, chest tightness, shortness of breath and wheezing.   Cardiovascular:  Negative for palpitations and leg swelling.  Gastrointestinal: Negative for nausea and vomiting.  Genitourinary: Negative for dysuria.  Musculoskeletal: Positive for gait problem and joint swelling.       Pain   Skin: Negative for rash.  Neurological: Negative for headaches.  Hematological: Does not bruise/bleed easily.  Psychiatric/Behavioral: Negative for dysphoric mood. The patient is not nervous/anxious.  Objective:   Physical Exam Vitals:   10/15/16 1628 10/15/16 1629  BP:  132/86  BP Location:  Left Arm  Cuff Size:  Normal  Pulse:  83  SpO2:  93%  Weight: 198 lb (89.8 kg)   Height: 5' 9.5" (1.765 m)    Gen: Pleasant, well-nourished, in no distress,  normal affect  ENT: No lesions,  mouth clear,  oropharynx clear, no postnasal drip  Neck: No JVD, no TMG, no carotid bruits  Lungs: No use of accessory muscles, distant, clear without rales or rhonchi  Cardiovascular: RRR, heart sounds normal, no murmur or gallops, no peripheral edema  Musculoskeletal: No deformities, no cyanosis or clubbing  Neuro: alert, non focal  Skin: Warm, no lesions or rashes     Assessment & Plan:  COPD (chronic obstructive pulmonary disease) We will do a trial of Anoro one inhalation once a day for the next month to see if you benefit.  Stop Spiriva temporarily Call us to let us know whether the new medication is beneficial. If so then we will order through your pharmacy. Keep albuterol available to use 2 puffs up to every 4 hours if needed for shortness of breath.  Follow with Dr Lamonte Sakai in 4 months or sooner if you have any problems.  Hypoxemia Continue your oxygen at 3L/min continuous flow with all exertion and while sleeping.   Baltazar Apo, MD, PhD 10/15/2016, 4:51 PM [facility] [phone] or if no answer [phone]

## 2016-10-15 NOTE — Patient Instructions (Signed)
We will do a trial of Anoro one inhalation once a day for the next month to see if you benefit.  Stop Spiriva temporarily Call us to let us know whether the new medication is beneficial. If so then we will order through your pharmacy. Keep albuterol available to use 2 puffs up to every 4 hours if needed for shortness of breath.  Continue your oxygen at 3L/min continuous flow with all exertion and while sleeping.  Follow with Dr Lamonte Sakai in 4 months or sooner if you have any problems.

## 2016-10-15 NOTE — Assessment & Plan Note (Signed)
We will do a trial of Anoro one inhalation once a day for the next month to see if you benefit.  Stop Spiriva temporarily Call us to let us know whether the new medication is beneficial. If so then we will order through your pharmacy. Keep albuterol available to use 2 puffs up to every 4 hours if needed for shortness of breath.  Follow with Dr Lamonte Sakai in 4 months or sooner if you have any problems.

## 2016-10-15 NOTE — Assessment & Plan Note (Signed)
Continue your oxygen at 3L/min continuous flow with all exertion and while sleeping.

## 2016-12-19 DIAGNOSIS — E7800 Pure hypercholesterolemia, unspecified: Secondary | ICD-10-CM | POA: Diagnosis not present

## 2016-12-19 DIAGNOSIS — J449 Chronic obstructive pulmonary disease, unspecified: Secondary | ICD-10-CM | POA: Diagnosis not present

## 2016-12-19 DIAGNOSIS — I10 Essential (primary) hypertension: Secondary | ICD-10-CM | POA: Diagnosis not present

## 2017-01-24 DIAGNOSIS — M1611 Unilateral primary osteoarthritis, right hip: Secondary | ICD-10-CM | POA: Diagnosis not present

## 2017-02-20 ENCOUNTER — Encounter: Payer: Self-pay | Admitting: Emergency Medicine

## 2017-02-20 ENCOUNTER — Ambulatory Visit (INDEPENDENT_AMBULATORY_CARE_PROVIDER_SITE_OTHER): Payer: Commercial Managed Care - PPO | Admitting: Emergency Medicine

## 2017-02-20 DIAGNOSIS — J449 Chronic obstructive pulmonary disease, unspecified: Secondary | ICD-10-CM | POA: Diagnosis not present

## 2017-02-20 NOTE — Patient Instructions (Signed)
We need to follow up in late May or early June for a pre=op evaluation before you can get hip surgery.  Continue your Spiriva once a day Take albuterol 2 puffs up to every 4 hours if needed for shortness of breath.  Continue your oxygen at 3l/min.

## 2017-02-20 NOTE — Progress Notes (Signed)
Subjective:    Patient ID: Patricia Sampson, female    DOB: Apr 14, 1956, 61 y.o.   MRN: [id number]  HPI 61 yo woman, former smoker 30 pk-yrs, hx of HTN, allergies. She was noted to be hypoxemic back in May'14. 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 CT scan 01/16/13 at [facility] showed emphysema, some associated scar.  She was tried on albuterol, alvesco, tudorza. She was given Anoro 12/30/13, only took it once and stopped it due to UA irritation.   ROV 03/01/14 -- follows up for COPD. UA irritation has made using BD's difficult. She is using symbicort via a spacer, spiriva. She is not sure the spiriva is helpful. She is using O2 with most exertion and it helps.   ROV 04/26/15 -- follow-up visit for her severe COPD. She had pulmonary function testing in the past that showed severe airflow limitation.  She wears O2 3L/min. Last time we stopped spiriva to see if she would miss. She has tolerated well, although her PFT (reviewed by me today) suggest that she would benefit from LAMA and LABA. No flares since last time. No cough or wheeze.     ROV 10/12/15 -- follow-up visit for severe COPD, chronic hypoxemic respiratory failure. Since her last visit she has had to stop her Spiriva Respimat and Symbicort due to cost. She has been off of these since July. She has noticed that her breathing has worsened. Her functional capacity is not as badly impaired because she has also been doing physical therapy after a hip surgery.    ROV 02/07/16 -- patient has a history of severe COPD and chronic hypoxemic respiratory failure. At her last visit she was off her inhaled medications so we restarted her Spiriva respimat. She has not needed albuterol very often on the Spiriva.  She wears O2 at 3L/min continuous. She would like a POC but most of these devices would be pulsed flow.  I am concerned that we need continuous flow instead.  ROV 06/11/16 -- follow-up visit for chronic hypoxemic rest for a failure in the setting of severe COPD. At her last visit we tried adding back Symbicort to her existing Spiriva. We also changed her oxygen from pulsed flow to continuous flow, currently on 3L/min. She has benefited from the change to continuous flow. She is unclear that her Symbicort was beneficial. She does not have an albuterol inhaler. No flares, no hospitalizations. She did have a URI but she did not need extra meds.              ROV 10/15/16 -- follow up today for COPD, chronic hypoxemia (3L/min continuous flow with exertion and while sleeping). We continued her Spiriva, stopped symbicort last time as it had not seemed to change her breathing. She has been doing well. Does not feel that her breathing or exercise tolerance has declined any since last time. She does not flare frequently.          ROV 02/20/17 -- pt has her he of COPD, chronic hypoxemia . She tried Anoro last time, didn't make any difference so she went back to Spiriva. She is preparing for hip surgery in June, under spinal anesthesia. She has not had any acute exacerbations since last time.  She never needs albuterol.                                                                                                                                                                                                                                                                                   No flowsheet data found.  Review of Systems  Constitutional: Negative for fever and unexpected weight change.  HENT: Negative for congestion, dental problem, ear pain,  nosebleeds, postnasal drip, rhinorrhea, sinus pressure, sneezing, sore throat and trouble swallowing.   Eyes: Negative for redness and itching.  Respiratory: Negative for cough, chest tightness, shortness of breath and wheezing.   Cardiovascular: Negative for palpitations and leg swelling.  Gastrointestinal: Negative for nausea and vomiting.  Genitourinary: Negative for dysuria.  Musculoskeletal: Positive for gait problem and joint swelling.       Pain   Skin: Negative for rash.  Neurological: Negative for  headaches.  Hematological: Does not bruise/bleed easily.  Psychiatric/Behavioral: Negative for dysphoric mood. The patient is not nervous/anxious.        Objective:   Physical Exam Vitals:   02/20/17 1645  BP: 122/80  Pulse: (!) 113  SpO2: 97%  Weight: 197 lb (89.4 kg)  Height: 5' 9.5" (1.765 m)   Gen: Pleasant, well-nourished, in no distress,  normal affect  ENT: No lesions,  mouth clear,  oropharynx clear, no postnasal drip  Neck: No JVD, no TMG, no carotid bruits  Lungs: No use of accessory muscles, distant, clear without rales or rhonchi  Cardiovascular: RRR, heart sounds normal, no murmur or gallops, no peripheral edema  Musculoskeletal: No deformities, no cyanosis or clubbing  Neuro: alert, non focal  Skin: Warm, no lesions or rashes     Assessment & Plan:  COPD (chronic obstructive pulmonary disease) Stable at this time. She will need pre-op clearance for spinal anesthesia but not until closer to the surgical date.   We need to follow up in late May or early June for a pre=op evaluation before you can get hip surgery.  Continue your Spiriva once a day Take albuterol 2 puffs up to every 4 hours if needed for shortness of breath.  Continue your oxygen at 3l/min.   Baltazar Apo, MD, PhD 02/20/2017, 5:46 PM [facility] [phone] or if no answer [phone]

## 2017-02-20 NOTE — Assessment & Plan Note (Signed)
Stable at this time. She will need pre-op clearance for spinal anesthesia but not until closer to the surgical date.   We need to follow up in late May or early June for a pre=op evaluation before you can get hip surgery.  Continue your Spiriva once a day Take albuterol 2 puffs up to every 4 hours if needed for shortness of breath.  Continue your oxygen at 3l/min.

## 2017-03-21 DIAGNOSIS — E7800 Pure hypercholesterolemia, unspecified: Secondary | ICD-10-CM | POA: Diagnosis not present

## 2017-03-21 DIAGNOSIS — I10 Essential (primary) hypertension: Secondary | ICD-10-CM | POA: Diagnosis not present

## 2017-03-21 DIAGNOSIS — J449 Chronic obstructive pulmonary disease, unspecified: Secondary | ICD-10-CM | POA: Diagnosis not present

## 2017-03-27 DIAGNOSIS — I2723 Pulmonary hypertension due to lung diseases and hypoxia: Secondary | ICD-10-CM | POA: Diagnosis not present

## 2017-03-27 DIAGNOSIS — J449 Chronic obstructive pulmonary disease, unspecified: Secondary | ICD-10-CM | POA: Diagnosis not present

## 2017-03-27 DIAGNOSIS — Z01810 Encounter for preprocedural cardiovascular examination: Secondary | ICD-10-CM | POA: Diagnosis not present

## 2017-04-09 DIAGNOSIS — Z01810 Encounter for preprocedural cardiovascular examination: Secondary | ICD-10-CM | POA: Diagnosis not present

## 2017-04-23 DIAGNOSIS — Z01818 Encounter for other preprocedural examination: Secondary | ICD-10-CM | POA: Diagnosis not present

## 2017-04-23 DIAGNOSIS — R52 Pain, unspecified: Secondary | ICD-10-CM | POA: Diagnosis not present

## 2017-04-23 DIAGNOSIS — Z01810 Encounter for preprocedural cardiovascular examination: Secondary | ICD-10-CM | POA: Diagnosis not present

## 2017-04-23 DIAGNOSIS — M79609 Pain in unspecified limb: Secondary | ICD-10-CM | POA: Diagnosis not present

## 2017-04-24 ENCOUNTER — Telehealth: Payer: Self-pay | Admitting: Emergency Medicine

## 2017-04-24 NOTE — Telephone Encounter (Signed)
Called and lmom for Patricia Sampson to make her aware that RB will not be back in the office until 6/4.  Will get this to him once he is back in the office and fax back once completed.  RB please advise once the form is completed. Thanks

## 2017-04-25 NOTE — Telephone Encounter (Signed)
lmom x2 to Ivin Booty to make her aware that [place] will not be in the office until June 4

## 2017-04-25 NOTE — Telephone Encounter (Signed)
Will forward message back to Jonelle Sidle to ensure forms are filled out when RB returns to clinic.

## 2017-04-25 NOTE — Telephone Encounter (Signed)
Patricia Sampson returned call. Advised her that [place] will not be back in the office until 06/04. She does not require a call back.

## 2017-04-29 NOTE — Telephone Encounter (Signed)
Pt already has an appt on 04/30/2017 with RB for pre-op eval. Jasmine is aware and has the form for pt's appt tomorrow. Will sign off.

## 2017-04-30 ENCOUNTER — Ambulatory Visit (INDEPENDENT_AMBULATORY_CARE_PROVIDER_SITE_OTHER): Payer: Commercial Managed Care - PPO | Admitting: Emergency Medicine

## 2017-04-30 ENCOUNTER — Encounter: Payer: Self-pay | Admitting: Emergency Medicine

## 2017-04-30 DIAGNOSIS — R0902 Hypoxemia: Secondary | ICD-10-CM | POA: Diagnosis not present

## 2017-04-30 DIAGNOSIS — J449 Chronic obstructive pulmonary disease, unspecified: Secondary | ICD-10-CM

## 2017-04-30 NOTE — Assessment & Plan Note (Signed)
Well compensated at this time. No barriers to proceeding with her hip surgery. It's going to be done under local anesthesia not general anesthesia. She needs to continue her Spiriva. She will need to be on her supplemental oxygen the procedure and afterwards.

## 2017-04-30 NOTE — Assessment & Plan Note (Signed)
3 L/m at all times

## 2017-04-30 NOTE — Patient Instructions (Signed)
Please continue current medications and your oxygen. No barriers to proceeding with hip surgery under local anesthesia.  Follow with Dr Lamonte Sakai in 6 months or sooner if you have any problems

## 2017-04-30 NOTE — Progress Notes (Signed)
Subjective:    Patient ID: Patricia Sampson, female    DOB: Apr 14, 1956, 61 y.o.   MRN: [id number]  HPI 61 yo woman, former smoker 30 pk-yrs, hx of HTN, allergies. She was noted to be hypoxemic back in May'14. 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 CT scan 01/16/13 at [facility] showed emphysema, some associated scar.  She was tried on albuterol, alvesco, tudorza. She was given Anoro 12/30/13, only took it once and stopped it due to UA irritation.   ROV 03/01/14 -- follows up for COPD. UA irritation has made using BD's difficult. She is using symbicort via a spacer, spiriva. She is not sure the spiriva is helpful. She is using O2 with most exertion and it helps.   ROV 04/26/15 -- follow-up visit for her severe COPD. She had pulmonary function testing in the past that showed severe airflow limitation.  She wears O2 3L/min. Last time we stopped spiriva to see if she would miss. She has tolerated well, although her PFT (reviewed by me today) suggest that she would benefit from LAMA and LABA. No flares since last time. No cough or wheeze.     ROV 10/12/15 -- follow-up visit for severe COPD, chronic hypoxemic respiratory failure. Since her last visit she has had to stop her Spiriva Respimat and Symbicort due to cost. She has been off of these since July. She has noticed that her breathing has worsened. Her functional capacity is not as badly impaired because she has also been doing physical therapy after a hip surgery.    ROV 02/07/16 -- patient has a history of severe COPD and chronic hypoxemic respiratory failure. At her last visit she was off her inhaled medications so we restarted her Spiriva respimat. She has not needed albuterol very often on the Spiriva.  She wears O2 at 3L/min continuous. She would like a POC but most of these devices would be pulsed flow.  I am concerned that we need continuous flow instead.  ROV 06/11/16 -- follow-up visit for chronic hypoxemic rest for a failure in the setting of severe COPD. At her last visit we tried adding back Symbicort to her existing Spiriva. We also changed her oxygen from pulsed flow to continuous flow, currently on 3L/min. She has benefited from the change to continuous flow. She is unclear that her Symbicort was beneficial. She does not have an albuterol inhaler. No flares, no hospitalizations. She did have a URI but she did not need extra meds.              ROV 10/15/16 -- follow up today for COPD, chronic hypoxemia (3L/min continuous flow with exertion and while sleeping). We continued her Spiriva, stopped symbicort last time as it had not seemed to change her breathing. She has been doing well. Does not feel that her breathing or exercise tolerance has declined any since last time. She does not flare frequently.          ROV 02/20/17 -- pt has her he of COPD, chronic hypoxemia . She tried Anoro last time, didn't make any difference so she went back to Spiriva. She is preparing for hip surgery in June, under spinal anesthesia. She has not had any acute exacerbations since last time.  She never needs albuterol.                 ROV 04/30/17 -- patient follows up today for her COPD with associated chronic hypoxemia. She is being prepared to undergo a right total hip arthroplasty by Dr. Adin Hector and presents for preoperative evaluation.  She is scheduled for 3 weeks from now. She is on Spiriva Respimat, feels that it benefits her. No change in symptoms. No flares. She rarely uses albuterol. She has good compliance with her O2 > 3 L/m                                                                                                                                                                                                                                             No flowsheet data found.  Review of Systems  Constitutional: Negative for fever and unexpected weight change.  HENT: Negative for congestion, dental problem, ear pain, nosebleeds, postnasal drip, rhinorrhea, sinus pressure, sneezing, sore throat and trouble swallowing.   Eyes: Negative for redness and itching.  Respiratory: Negative for cough, chest tightness, shortness of breath  and wheezing.   Cardiovascular: Negative for palpitations and leg swelling.  Gastrointestinal: Negative for nausea and vomiting.  Genitourinary: Negative for dysuria.  Musculoskeletal: Positive for gait problem and joint swelling.       Pain   Skin: Negative for rash.  Neurological: Negative for headaches.  Hematological: Does not bruise/bleed easily.  Psychiatric/Behavioral: Negative for dysphoric mood. The patient is not nervous/anxious.        Objective:   Physical Exam Vitals:   04/30/17 1604  BP: 124/84  Pulse: 75  SpO2: 93%  Weight: 197 lb 3.2 oz (89.4 kg)  Height: 5' 9.5" (1.765 m)   Gen: Pleasant, well-nourished, in no distress,  normal affect  ENT: No lesions,  mouth clear,  oropharynx clear, no postnasal drip  Neck: No JVD, no TMG, no carotid bruits  Lungs: No use of accessory muscles, distant, clear without rales or rhonchi  Cardiovascular: RRR, heart sounds normal, no murmur or gallops, no peripheral edema  Musculoskeletal: No deformities, no cyanosis or clubbing  Neuro: alert, non focal  Skin: Warm, no lesions or rashes     Assessment & Plan:  COPD (chronic obstructive pulmonary disease) Well compensated at this time. No barriers to proceeding with her hip surgery. It's going to be done under local anesthesia not general anesthesia. She needs to continue her Spiriva.  She will need to be on her supplemental oxygen the procedure and afterwards.  Hypoxemia 3 L/m at all times  Baltazar Apo, MD, PhD 04/30/2017, 4:30 PM [facility] [phone] or if no answer [phone]

## 2017-05-06 DIAGNOSIS — R0902 Hypoxemia: Secondary | ICD-10-CM | POA: Diagnosis not present

## 2017-05-06 DIAGNOSIS — J449 Chronic obstructive pulmonary disease, unspecified: Secondary | ICD-10-CM | POA: Diagnosis not present

## 2017-05-13 DIAGNOSIS — M1611 Unilateral primary osteoarthritis, right hip: Secondary | ICD-10-CM | POA: Diagnosis not present

## 2017-05-22 DIAGNOSIS — E119 Type 2 diabetes mellitus without complications: Secondary | ICD-10-CM | POA: Diagnosis not present

## 2017-05-22 DIAGNOSIS — J449 Chronic obstructive pulmonary disease, unspecified: Secondary | ICD-10-CM | POA: Diagnosis not present

## 2017-05-22 DIAGNOSIS — Z96641 Presence of right artificial hip joint: Secondary | ICD-10-CM | POA: Diagnosis not present

## 2017-05-22 DIAGNOSIS — D649 Anemia, unspecified: Secondary | ICD-10-CM | POA: Diagnosis not present

## 2017-05-22 DIAGNOSIS — M1611 Unilateral primary osteoarthritis, right hip: Secondary | ICD-10-CM | POA: Diagnosis not present

## 2017-05-22 DIAGNOSIS — Z471 Aftercare following joint replacement surgery: Secondary | ICD-10-CM | POA: Diagnosis not present

## 2017-05-22 DIAGNOSIS — R269 Unspecified abnormalities of gait and mobility: Secondary | ICD-10-CM | POA: Diagnosis not present

## 2017-05-26 DIAGNOSIS — I10 Essential (primary) hypertension: Secondary | ICD-10-CM | POA: Diagnosis not present

## 2017-05-26 DIAGNOSIS — M1991 Primary osteoarthritis, unspecified site: Secondary | ICD-10-CM | POA: Diagnosis not present

## 2017-05-26 DIAGNOSIS — Z471 Aftercare following joint replacement surgery: Secondary | ICD-10-CM | POA: Diagnosis not present

## 2017-05-28 DIAGNOSIS — Z471 Aftercare following joint replacement surgery: Secondary | ICD-10-CM | POA: Diagnosis not present

## 2017-05-28 DIAGNOSIS — M1991 Primary osteoarthritis, unspecified site: Secondary | ICD-10-CM | POA: Diagnosis not present

## 2017-05-28 DIAGNOSIS — I10 Essential (primary) hypertension: Secondary | ICD-10-CM | POA: Diagnosis not present

## 2017-05-30 DIAGNOSIS — I10 Essential (primary) hypertension: Secondary | ICD-10-CM | POA: Diagnosis not present

## 2017-05-30 DIAGNOSIS — Z471 Aftercare following joint replacement surgery: Secondary | ICD-10-CM | POA: Diagnosis not present

## 2017-05-30 DIAGNOSIS — M1991 Primary osteoarthritis, unspecified site: Secondary | ICD-10-CM | POA: Diagnosis not present

## 2017-06-03 DIAGNOSIS — Z471 Aftercare following joint replacement surgery: Secondary | ICD-10-CM | POA: Diagnosis not present

## 2017-06-03 DIAGNOSIS — M1991 Primary osteoarthritis, unspecified site: Secondary | ICD-10-CM | POA: Diagnosis not present

## 2017-06-03 DIAGNOSIS — I10 Essential (primary) hypertension: Secondary | ICD-10-CM | POA: Diagnosis not present

## 2017-06-05 DIAGNOSIS — I10 Essential (primary) hypertension: Secondary | ICD-10-CM | POA: Diagnosis not present

## 2017-06-05 DIAGNOSIS — J449 Chronic obstructive pulmonary disease, unspecified: Secondary | ICD-10-CM | POA: Diagnosis not present

## 2017-06-05 DIAGNOSIS — R0902 Hypoxemia: Secondary | ICD-10-CM | POA: Diagnosis not present

## 2017-06-05 DIAGNOSIS — M1991 Primary osteoarthritis, unspecified site: Secondary | ICD-10-CM | POA: Diagnosis not present

## 2017-06-05 DIAGNOSIS — Z471 Aftercare following joint replacement surgery: Secondary | ICD-10-CM | POA: Diagnosis not present

## 2017-06-07 DIAGNOSIS — M1991 Primary osteoarthritis, unspecified site: Secondary | ICD-10-CM | POA: Diagnosis not present

## 2017-06-07 DIAGNOSIS — I10 Essential (primary) hypertension: Secondary | ICD-10-CM | POA: Diagnosis not present

## 2017-06-07 DIAGNOSIS — Z471 Aftercare following joint replacement surgery: Secondary | ICD-10-CM | POA: Diagnosis not present

## 2017-06-10 DIAGNOSIS — M25551 Pain in right hip: Secondary | ICD-10-CM | POA: Diagnosis not present

## 2017-06-10 DIAGNOSIS — R2689 Other abnormalities of gait and mobility: Secondary | ICD-10-CM | POA: Diagnosis not present

## 2017-06-12 DIAGNOSIS — M25551 Pain in right hip: Secondary | ICD-10-CM | POA: Diagnosis not present

## 2017-06-12 DIAGNOSIS — R2689 Other abnormalities of gait and mobility: Secondary | ICD-10-CM | POA: Diagnosis not present

## 2017-06-14 DIAGNOSIS — Z96649 Presence of unspecified artificial hip joint: Secondary | ICD-10-CM | POA: Diagnosis not present

## 2017-06-14 DIAGNOSIS — M1611 Unilateral primary osteoarthritis, right hip: Secondary | ICD-10-CM | POA: Diagnosis not present

## 2017-06-17 DIAGNOSIS — M25551 Pain in right hip: Secondary | ICD-10-CM | POA: Diagnosis not present

## 2017-06-17 DIAGNOSIS — R2689 Other abnormalities of gait and mobility: Secondary | ICD-10-CM | POA: Diagnosis not present

## 2017-06-19 DIAGNOSIS — R2689 Other abnormalities of gait and mobility: Secondary | ICD-10-CM | POA: Diagnosis not present

## 2017-06-19 DIAGNOSIS — M25551 Pain in right hip: Secondary | ICD-10-CM | POA: Diagnosis not present

## 2017-06-24 DIAGNOSIS — R2689 Other abnormalities of gait and mobility: Secondary | ICD-10-CM | POA: Diagnosis not present

## 2017-06-24 DIAGNOSIS — M25551 Pain in right hip: Secondary | ICD-10-CM | POA: Diagnosis not present

## 2017-06-27 DIAGNOSIS — R2689 Other abnormalities of gait and mobility: Secondary | ICD-10-CM | POA: Diagnosis not present

## 2017-06-27 DIAGNOSIS — M25551 Pain in right hip: Secondary | ICD-10-CM | POA: Diagnosis not present

## 2017-07-01 DIAGNOSIS — R2689 Other abnormalities of gait and mobility: Secondary | ICD-10-CM | POA: Diagnosis not present

## 2017-07-01 DIAGNOSIS — M25551 Pain in right hip: Secondary | ICD-10-CM | POA: Diagnosis not present

## 2017-07-04 DIAGNOSIS — M25551 Pain in right hip: Secondary | ICD-10-CM | POA: Diagnosis not present

## 2017-07-04 DIAGNOSIS — R2689 Other abnormalities of gait and mobility: Secondary | ICD-10-CM | POA: Diagnosis not present

## 2017-07-05 DIAGNOSIS — Z96649 Presence of unspecified artificial hip joint: Secondary | ICD-10-CM | POA: Diagnosis not present

## 2017-07-05 DIAGNOSIS — M1611 Unilateral primary osteoarthritis, right hip: Secondary | ICD-10-CM | POA: Diagnosis not present

## 2017-07-06 DIAGNOSIS — J449 Chronic obstructive pulmonary disease, unspecified: Secondary | ICD-10-CM | POA: Diagnosis not present

## 2017-07-06 DIAGNOSIS — R0902 Hypoxemia: Secondary | ICD-10-CM | POA: Diagnosis not present

## 2017-07-08 DIAGNOSIS — M25551 Pain in right hip: Secondary | ICD-10-CM | POA: Diagnosis not present

## 2017-07-08 DIAGNOSIS — R2689 Other abnormalities of gait and mobility: Secondary | ICD-10-CM | POA: Diagnosis not present

## 2017-07-11 DIAGNOSIS — M25551 Pain in right hip: Secondary | ICD-10-CM | POA: Diagnosis not present

## 2017-07-11 DIAGNOSIS — R2689 Other abnormalities of gait and mobility: Secondary | ICD-10-CM | POA: Diagnosis not present

## 2017-07-15 DIAGNOSIS — M25551 Pain in right hip: Secondary | ICD-10-CM | POA: Diagnosis not present

## 2017-07-15 DIAGNOSIS — R2689 Other abnormalities of gait and mobility: Secondary | ICD-10-CM | POA: Diagnosis not present

## 2017-07-18 DIAGNOSIS — R2689 Other abnormalities of gait and mobility: Secondary | ICD-10-CM | POA: Diagnosis not present

## 2017-07-18 DIAGNOSIS — M25551 Pain in right hip: Secondary | ICD-10-CM | POA: Diagnosis not present

## 2017-07-26 DIAGNOSIS — M25551 Pain in right hip: Secondary | ICD-10-CM | POA: Diagnosis not present

## 2017-07-26 DIAGNOSIS — R2689 Other abnormalities of gait and mobility: Secondary | ICD-10-CM | POA: Diagnosis not present

## 2017-07-31 DIAGNOSIS — M25551 Pain in right hip: Secondary | ICD-10-CM | POA: Diagnosis not present

## 2017-07-31 DIAGNOSIS — R2689 Other abnormalities of gait and mobility: Secondary | ICD-10-CM | POA: Diagnosis not present

## 2017-08-06 DIAGNOSIS — J449 Chronic obstructive pulmonary disease, unspecified: Secondary | ICD-10-CM | POA: Diagnosis not present

## 2017-08-06 DIAGNOSIS — R0902 Hypoxemia: Secondary | ICD-10-CM | POA: Diagnosis not present

## 2017-08-07 DIAGNOSIS — R2689 Other abnormalities of gait and mobility: Secondary | ICD-10-CM | POA: Diagnosis not present

## 2017-08-07 DIAGNOSIS — M25551 Pain in right hip: Secondary | ICD-10-CM | POA: Diagnosis not present

## 2017-08-12 DIAGNOSIS — M25551 Pain in right hip: Secondary | ICD-10-CM | POA: Diagnosis not present

## 2017-08-12 DIAGNOSIS — R2689 Other abnormalities of gait and mobility: Secondary | ICD-10-CM | POA: Diagnosis not present

## 2017-08-16 DIAGNOSIS — Z96649 Presence of unspecified artificial hip joint: Secondary | ICD-10-CM | POA: Diagnosis not present

## 2017-08-26 DIAGNOSIS — M25551 Pain in right hip: Secondary | ICD-10-CM | POA: Diagnosis not present

## 2017-08-26 DIAGNOSIS — R2689 Other abnormalities of gait and mobility: Secondary | ICD-10-CM | POA: Diagnosis not present

## 2017-09-02 DIAGNOSIS — R2689 Other abnormalities of gait and mobility: Secondary | ICD-10-CM | POA: Diagnosis not present

## 2017-09-02 DIAGNOSIS — M25551 Pain in right hip: Secondary | ICD-10-CM | POA: Diagnosis not present

## 2017-09-05 DIAGNOSIS — J449 Chronic obstructive pulmonary disease, unspecified: Secondary | ICD-10-CM | POA: Diagnosis not present

## 2017-09-05 DIAGNOSIS — R0902 Hypoxemia: Secondary | ICD-10-CM | POA: Diagnosis not present

## 2017-09-25 DIAGNOSIS — I10 Essential (primary) hypertension: Secondary | ICD-10-CM | POA: Diagnosis not present

## 2017-09-25 DIAGNOSIS — I2723 Pulmonary hypertension due to lung diseases and hypoxia: Secondary | ICD-10-CM | POA: Diagnosis not present

## 2017-09-25 DIAGNOSIS — J449 Chronic obstructive pulmonary disease, unspecified: Secondary | ICD-10-CM | POA: Diagnosis not present

## 2017-10-06 DIAGNOSIS — J449 Chronic obstructive pulmonary disease, unspecified: Secondary | ICD-10-CM | POA: Diagnosis not present

## 2017-10-06 DIAGNOSIS — R0902 Hypoxemia: Secondary | ICD-10-CM | POA: Diagnosis not present

## 2017-10-14 DIAGNOSIS — Z1231 Encounter for screening mammogram for malignant neoplasm of breast: Secondary | ICD-10-CM | POA: Diagnosis not present

## 2017-10-28 ENCOUNTER — Encounter: Payer: Self-pay | Admitting: Emergency Medicine

## 2017-10-28 ENCOUNTER — Ambulatory Visit: Payer: Commercial Managed Care - PPO | Admitting: Emergency Medicine

## 2017-10-28 DIAGNOSIS — J449 Chronic obstructive pulmonary disease, unspecified: Secondary | ICD-10-CM

## 2017-10-28 DIAGNOSIS — R0902 Hypoxemia: Secondary | ICD-10-CM | POA: Diagnosis not present

## 2017-10-28 NOTE — Assessment & Plan Note (Signed)
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  Associated hypoxemia.  No exacerbations.  I would like to do a trial of changing her Spiriva to Stiolto to see if this is an improvement.  She did not believe that Anoro was superior to Spiriva.  Please temporarily stop Spiriva Respimat. We will try starting Stiolto 2 puffs once a day to see if it gives you additional benefit.  If you like this medication please call our office to let us know and we will order it through your pharmacy. Take albuterol 2 puffs up to every 4 hours if needed for shortness of breath.  Flu shot up to date.  Follow with Dr Lamonte Sakai in 3 months or sooner if you have any problems.

## 2017-10-28 NOTE — Progress Notes (Signed)
Subjective:    Patient ID: Camie Patience, female    DOB: 23-Oct-1956, 61 y.o.   MRN: [id number]  HPI          ROV 04/30/17 -- patient follows up today for her COPD with associated chronic hypoxemia. She is being prepared to undergo a right total hip arthroplasty by Dr. Adin Hector and presents for preoperative evaluation.  She is scheduled for 3 weeks from now. She is on Spiriva Respimat, feels that it benefits her. No change in symptoms. No flares. She rarely uses albuterol. She has good compliance with her O2 > 3 L/m                 ROV 10/28/17 --61 year old former smoker with a history of hypertension, allergic rhinitis, COPD and chronic hypoxemic respiratory failure.  Significant emphysematous change on chest x-ray and CT scan of the chest. She has been unable to carry her O2 easily. She is on 3L/min continuous.   Her activity has not rebounded post-R hip surgery 4 months ago.   No flowsheet data found.  Review of Systems  Constitutional: Negative for fever and unexpected weight change.  HENT: Negative for congestion, dental problem, ear pain, nosebleeds, postnasal drip, rhinorrhea, sinus pressure, sneezing, sore throat and trouble swallowing.   Eyes: Negative for redness and itching.  Respiratory: Negative for cough, chest tightness, shortness of breath and wheezing.   Cardiovascular: Negative for palpitations and leg swelling.  Gastrointestinal: Negative for nausea and vomiting.  Genitourinary: Negative for dysuria.  Musculoskeletal: Positive for gait problem and joint swelling.       Pain   Skin: Negative for rash.  Neurological: Negative for headaches.  Hematological: Does not bruise/bleed easily.  Psychiatric/Behavioral: Negative for dysphoric mood. The patient is not nervous/anxious.        Objective:   Physical Exam Vitals:   10/28/17 1547  BP: 126/82  Pulse: 83  SpO2: 96%  Weight: 207 lb 6.4 oz (94.1 kg)  Height: _0  (1.753 m)   Gen: Pleasant, well-nourished, in no  distress,  normal affect  ENT: No lesions,  mouth clear,  oropharynx clear, no postnasal drip  Neck: No JVD, no TMG, no carotid bruits  Lungs: No use of accessory muscles, distant, clear without rales or rhonchi  Cardiovascular: RRR, heart sounds normal, no murmur or gallops, no peripheral edema  Musculoskeletal: No deformities, no cyanosis or clubbing  Neuro: alert, non focal  Skin: Warm, no lesions or rashes     Assessment & Plan:  COPD (chronic obstructive pulmonary disease) Severe COPD with significant emphysematous lung disease.  Associated hypoxemia.  No exacerbations.  I would like to do a trial of changing her Spiriva to Stiolto to see if this is an improvement.  She did not believe that Anoro was superior to Spiriva.  Please temporarily stop Spiriva Respimat. We will try starting Stiolto 2 puffs once a day to see if it gives you additional benefit.  If you like this medication please call our office to let us know and we will order it through your pharmacy. Take albuterol 2 puffs up to every 4 hours if needed for shortness of breath.  Flu shot up to date.  Follow with Dr Lamonte Sakai in 3 months or sooner if you have any problems.  Hypoxemia Chronic hypoxemic respiratory failure due to significant emphysema.  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  Clearly she has frequent desaturations whenever she does these tasks.  I believe she needs to be on the most portable system possible and to use it while she is working both in [employer] and in the office.  We will attempt to get her a portable oxygen concentrator, confirm that she can tolerate pulsed oxygen.   We will work on getting a Marine scientist by walking today on a pulsed oxygen system.   Baltazar Apo, MD, PhD 10/28/2017, 4:07 PM [facility] [phone] or if no answer [phone]

## 2017-10-28 NOTE — Assessment & Plan Note (Signed)
Chronic hypoxemic respiratory failure due to significant emphysema.  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  Clearly she has frequent desaturations whenever she does these tasks.  I believe she needs to be on the most portable system possible and to use it while she is working both in [employer] and in the office.  We will attempt to get her a portable oxygen concentrator, confirm that she can tolerate pulsed oxygen.   We will work on getting a Marine scientist by walking today on a pulsed oxygen system.

## 2017-10-28 NOTE — Patient Instructions (Signed)
We will work on getting a Marine scientist by walking today on a pulsed oxygen system.  Please temporarily stop Spiriva Respimat. We will try starting Stiolto 2 puffs once a day to see if it gives you additional benefit.  If you like this medication please call our office to let us know and we will order it through your pharmacy. Take albuterol 2 puffs up to every 4 hours if needed for shortness of breath.  Flu shot up to date.  Follow with Dr Lamonte Sakai in 3 months or sooner if you have any problems.

## 2017-11-05 DIAGNOSIS — J449 Chronic obstructive pulmonary disease, unspecified: Secondary | ICD-10-CM | POA: Diagnosis not present

## 2017-11-05 DIAGNOSIS — R0902 Hypoxemia: Secondary | ICD-10-CM | POA: Diagnosis not present

## 2017-11-21 DIAGNOSIS — Z96641 Presence of right artificial hip joint: Secondary | ICD-10-CM | POA: Diagnosis not present

## 2017-11-21 DIAGNOSIS — M1611 Unilateral primary osteoarthritis, right hip: Secondary | ICD-10-CM | POA: Diagnosis not present

## 2017-12-03 ENCOUNTER — Ambulatory Visit: Payer: Commercial Managed Care - PPO | Admitting: Emergency Medicine

## 2017-12-06 DIAGNOSIS — R0902 Hypoxemia: Secondary | ICD-10-CM | POA: Diagnosis not present

## 2017-12-06 DIAGNOSIS — J449 Chronic obstructive pulmonary disease, unspecified: Secondary | ICD-10-CM | POA: Diagnosis not present

## 2017-12-09 DIAGNOSIS — Z1211 Encounter for screening for malignant neoplasm of colon: Secondary | ICD-10-CM | POA: Diagnosis not present

## 2017-12-09 DIAGNOSIS — I10 Essential (primary) hypertension: Secondary | ICD-10-CM | POA: Diagnosis not present

## 2017-12-09 DIAGNOSIS — J449 Chronic obstructive pulmonary disease, unspecified: Secondary | ICD-10-CM | POA: Diagnosis not present

## 2017-12-25 ENCOUNTER — Ambulatory Visit: Payer: Commercial Managed Care - PPO | Admitting: Emergency Medicine

## 2017-12-25 ENCOUNTER — Encounter: Payer: Self-pay | Admitting: Emergency Medicine

## 2017-12-25 ENCOUNTER — Other Ambulatory Visit: Payer: Commercial Managed Care - PPO

## 2017-12-25 VITALS — BP 142/90 | HR 95

## 2017-12-25 DIAGNOSIS — I2729 Other secondary pulmonary hypertension: Secondary | ICD-10-CM

## 2017-12-25 DIAGNOSIS — R0902 Hypoxemia: Secondary | ICD-10-CM

## 2017-12-25 DIAGNOSIS — J449 Chronic obstructive pulmonary disease, unspecified: Secondary | ICD-10-CM

## 2017-12-25 MED ORDER — TIOTROPIUM BROMIDE-OLODATEROL 2.5-2.5 MCG/ACT IN AERS: 2.0000 | INHALATION_SPRAY | Freq: Every day | RESPIRATORY_TRACT | 5 refills | Status: DC

## 2017-12-25 MED ORDER — TIOTROPIUM BROMIDE-OLODATEROL 2.5-2.5 MCG/ACT IN AERS: 2.0000 | INHALATION_SPRAY | Freq: Two times a day (BID) | RESPIRATORY_TRACT | 5 refills | Status: DC

## 2017-12-25 MED ORDER — TIOTROPIUM BROMIDE-OLODATEROL 2.5-2.5 MCG/ACT IN AERS: 2.0000 | INHALATION_SPRAY | Freq: Every day | RESPIRATORY_TRACT | 0 refills | Status: DC

## 2017-12-25 NOTE — Assessment & Plan Note (Signed)
Likely due to her COPD/emphysema but need to assess for evolving right heart failure, possible thromboembolic disease in the aftermath of a recent right hip surgery.  I will screen with a d-dimer.  Consider either VQ scan or CT-PA if the d-dimer is positive.  1 of the main things we need to accomplish is get her adequate oxygen while she is working.  Her work is strenuous.  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  I think she needs a continuous flow oxygen concentrator, unclear whether one is available.  We will try to obtain it for her.

## 2017-12-25 NOTE — Patient Instructions (Signed)
We will continue Stiolto 2 puffs once a day We will work on obtaining a continuous flow portable oxygen concentrator.  You need to use 3 L/min at rest, increase to 4 L/min with exertion. We will obtain an echocardiogram to evaluate heart function We will obtain lab work, d-dimer, to screen for any risk for blood clots We discussed your work responsibilities, the effects on your oxygen levels today.  Follow with Dr Lamonte Sakai next available

## 2017-12-25 NOTE — Assessment & Plan Note (Signed)
Question whether she is having progressive secondary pulmonary hypertension due to her emphysema, persistent exertional hypoxemia.  She does have increased lower extremity edema.  I believe she needs an echocardiogram to assess right heart function.

## 2017-12-25 NOTE — Assessment & Plan Note (Signed)
I will change Spiriva to Stiolto.  Give her samples today in order through your pharmacy.  Continue albuterol as needed.

## 2017-12-25 NOTE — Progress Notes (Signed)
Subjective:    Patient ID: Patricia Sampson, female    DOB: January 03, 1956, 62 y.o.   MRN: [id number]  COPD  There is no cough, shortness of breath or wheezing. Pertinent negatives include no ear pain, fever, headaches, postnasal drip, rhinorrhea, sneezing, sore throat or trouble swallowing. Her past medical history is significant for COPD.            ROV 04/30/17 -- patient follows up today for her COPD with associated chronic hypoxemia. She is being prepared to undergo a right total hip arthroplasty by Dr. Adin Hector and presents for preoperative evaluation.  She is scheduled for 3 weeks from now. She is on Spiriva Respimat, feels that it benefits her. No change in symptoms. No flares. She rarely uses albuterol. She has good compliance with her O2 > 3 L/m                 ROV 10/28/17 --62 year old former smoker with a history of hypertension, allergic rhinitis, COPD and chronic hypoxemic respiratory failure.  Significant emphysematous change on chest x-ray and CT scan of the chest. She has been unable to carry her O2 easily. She is on 3L/min continuous.   Her activity has not rebounded post-R hip surgery 4 months ago.   ROV 12/25/17 --this is a follow-up visit for patient with severe COPD with emphysema, chronic hypoxemic respiratory failure for which she has been using 3 L/min continuously.  At her last visit she had been more dyspneic so a child changing her Spiriva to Patricia Sampson to see if she would benefit. She describes today that she remains dyspneic, her exertional tolerance remains poor. She denies any CP. She has some LE edema bilaterally. She has exertional desats. She is still working, has to walk a lot there. We tried to get a POC but she can't do pulsed flow O2.    No flowsheet data found.  Review of Systems  Constitutional: Negative for fever and unexpected weight change.  HENT: Negative for congestion, dental problem, ear pain, nosebleeds, postnasal drip, rhinorrhea, sinus pressure, sneezing,  sore throat and trouble swallowing.   Eyes: Negative for redness and itching.  Respiratory: Negative for cough, chest tightness, shortness of breath and wheezing.   Cardiovascular: Negative for palpitations and leg swelling.  Gastrointestinal: Negative for nausea and vomiting.  Genitourinary: Negative for dysuria.  Musculoskeletal: Positive for gait problem and joint swelling.       Pain   Skin: Negative for rash.  Neurological: Negative for headaches.  Hematological: Does not bruise/bleed easily.  Psychiatric/Behavioral: Negative for dysphoric mood. The patient is not nervous/anxious.        Objective:   Physical Exam Vitals:   12/25/17 1621  BP: (!) 142/90  Pulse: 95  SpO2: 90%   Gen: Pleasant, well-nourished, in no distress,  normal affect  ENT: No lesions,  mouth clear,  oropharynx clear, no postnasal drip  Neck: No JVD, no stridor  Lungs: No use of accessory muscles, distant, clear without rales or rhonchi  Cardiovascular: RRR, heart sounds normal, no murmur or gallops, no peripheral edema  Musculoskeletal: No deformities, no cyanosis or clubbing  Neuro: alert, non focal  Skin: Warm, no lesions or rashes     Assessment & Plan:  Secondary pulmonary arterial hypertension (Patricia Sampson) Question whether she is having progressive secondary pulmonary hypertension due to her emphysema, persistent exertional hypoxemia.  She does have increased lower extremity edema.  I believe she needs an echocardiogram to assess right heart function.  Hypoxemia Likely  due to her COPD/emphysema but need to assess for evolving right heart failure, possible thromboembolic disease in the aftermath of a recent right hip surgery.  I will screen with a d-dimer.  Consider either VQ scan or CT-PA if the d-dimer is positive.  1 of the main things we need to accomplish is get her adequate oxygen while she is working.  Her work is strenuous.  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  I think she needs a continuous flow oxygen concentrator, unclear whether one is available.  We will try to obtain it for her.  COPD (chronic obstructive pulmonary disease) I will change Spiriva to Stiolto.  Give her samples today in order through your pharmacy.  Continue albuterol as needed.  Patricia Apo, MD, PhD 12/25/2017, 5:12 PM [facility] [phone] or if no answer [phone]

## 2017-12-26 ENCOUNTER — Telehealth (HOSPITAL_COMMUNITY): Payer: Self-pay | Admitting: Emergency Medicine

## 2017-12-26 LAB — D-DIMER, QUANTITATIVE: D-Dimer, Quant: 0.61 mcg/mL FEU — ABNORMAL HIGH (ref <0.50)

## 2018-01-02 DIAGNOSIS — R0902 Hypoxemia: Secondary | ICD-10-CM | POA: Diagnosis not present

## 2018-01-02 DIAGNOSIS — J449 Chronic obstructive pulmonary disease, unspecified: Secondary | ICD-10-CM | POA: Diagnosis not present

## 2018-01-06 ENCOUNTER — Other Ambulatory Visit: Payer: Self-pay

## 2018-01-06 ENCOUNTER — Ambulatory Visit (HOSPITAL_COMMUNITY): Payer: Commercial Managed Care - PPO | Attending: Cardiology

## 2018-01-06 DIAGNOSIS — I081 Rheumatic disorders of both mitral and tricuspid valves: Secondary | ICD-10-CM | POA: Diagnosis not present

## 2018-01-06 DIAGNOSIS — J449 Chronic obstructive pulmonary disease, unspecified: Secondary | ICD-10-CM | POA: Insufficient documentation

## 2018-01-06 DIAGNOSIS — I2729 Other secondary pulmonary hypertension: Secondary | ICD-10-CM | POA: Diagnosis not present

## 2018-01-06 DIAGNOSIS — R0902 Hypoxemia: Secondary | ICD-10-CM | POA: Insufficient documentation

## 2018-01-07 NOTE — Telephone Encounter (Signed)
User: [name] Date/time: 12/27/17 2:43 PM  Comment: Called pt and lmsg for her to CB to get scheduled for an echo.  Context:  Outcome: Left Message  Phone number: [phone] Phone Type: Home Phone  Comm. type: Telephone Call type: Outgoing  Contact: Camie Patience Relation to patient: Self    User: [name] Date/time: 12/26/17 2:42 PM  Comment: Called pt and lmsg for her to Cb to sch echo.Vassie Moment  Context:  Outcome: Left Message  Phone number: [phone] Phone Type: Home Phone  Comm. type: Telephone Call type: Outgoing  Contact: Camie Patience Relation to patient: Self  Patient Demographics for Patricia, Sampson [[id number]]

## 2018-01-30 DIAGNOSIS — J449 Chronic obstructive pulmonary disease, unspecified: Secondary | ICD-10-CM | POA: Diagnosis not present

## 2018-01-30 DIAGNOSIS — R0902 Hypoxemia: Secondary | ICD-10-CM | POA: Diagnosis not present

## 2018-03-02 DIAGNOSIS — J449 Chronic obstructive pulmonary disease, unspecified: Secondary | ICD-10-CM | POA: Diagnosis not present

## 2018-03-02 DIAGNOSIS — R0902 Hypoxemia: Secondary | ICD-10-CM | POA: Diagnosis not present

## 2018-03-25 DIAGNOSIS — Z01419 Encounter for gynecological examination (general) (routine) without abnormal findings: Secondary | ICD-10-CM | POA: Diagnosis not present

## 2018-03-25 DIAGNOSIS — Z0001 Encounter for general adult medical examination with abnormal findings: Secondary | ICD-10-CM | POA: Diagnosis not present

## 2018-04-01 DIAGNOSIS — J449 Chronic obstructive pulmonary disease, unspecified: Secondary | ICD-10-CM | POA: Diagnosis not present

## 2018-04-01 DIAGNOSIS — R0902 Hypoxemia: Secondary | ICD-10-CM | POA: Diagnosis not present

## 2018-05-02 DIAGNOSIS — J449 Chronic obstructive pulmonary disease, unspecified: Secondary | ICD-10-CM | POA: Diagnosis not present

## 2018-05-02 DIAGNOSIS — R0902 Hypoxemia: Secondary | ICD-10-CM | POA: Diagnosis not present

## 2018-05-26 DIAGNOSIS — Z96649 Presence of unspecified artificial hip joint: Secondary | ICD-10-CM | POA: Diagnosis not present

## 2018-06-01 DIAGNOSIS — J449 Chronic obstructive pulmonary disease, unspecified: Secondary | ICD-10-CM | POA: Diagnosis not present

## 2018-06-01 DIAGNOSIS — R0902 Hypoxemia: Secondary | ICD-10-CM | POA: Diagnosis not present

## 2018-07-02 DIAGNOSIS — J449 Chronic obstructive pulmonary disease, unspecified: Secondary | ICD-10-CM | POA: Diagnosis not present

## 2018-07-02 DIAGNOSIS — R0902 Hypoxemia: Secondary | ICD-10-CM | POA: Diagnosis not present

## 2018-07-07 ENCOUNTER — Ambulatory Visit: Payer: Commercial Managed Care - PPO | Admitting: Emergency Medicine

## 2018-07-21 DIAGNOSIS — J449 Chronic obstructive pulmonary disease, unspecified: Secondary | ICD-10-CM | POA: Diagnosis not present

## 2018-07-21 DIAGNOSIS — E7800 Pure hypercholesterolemia, unspecified: Secondary | ICD-10-CM | POA: Diagnosis not present

## 2018-07-21 DIAGNOSIS — I10 Essential (primary) hypertension: Secondary | ICD-10-CM | POA: Diagnosis not present

## 2018-08-02 DIAGNOSIS — J449 Chronic obstructive pulmonary disease, unspecified: Secondary | ICD-10-CM | POA: Diagnosis not present

## 2018-08-02 DIAGNOSIS — R0902 Hypoxemia: Secondary | ICD-10-CM | POA: Diagnosis not present

## 2018-09-01 DIAGNOSIS — J449 Chronic obstructive pulmonary disease, unspecified: Secondary | ICD-10-CM | POA: Diagnosis not present

## 2018-09-01 DIAGNOSIS — R0902 Hypoxemia: Secondary | ICD-10-CM | POA: Diagnosis not present

## 2018-09-19 DIAGNOSIS — Z79899 Other long term (current) drug therapy: Secondary | ICD-10-CM | POA: Diagnosis not present

## 2018-09-19 DIAGNOSIS — I10 Essential (primary) hypertension: Secondary | ICD-10-CM | POA: Diagnosis not present

## 2018-09-19 DIAGNOSIS — J449 Chronic obstructive pulmonary disease, unspecified: Secondary | ICD-10-CM | POA: Diagnosis not present

## 2018-09-19 DIAGNOSIS — E7800 Pure hypercholesterolemia, unspecified: Secondary | ICD-10-CM | POA: Diagnosis not present

## 2018-10-02 DIAGNOSIS — J449 Chronic obstructive pulmonary disease, unspecified: Secondary | ICD-10-CM | POA: Diagnosis not present

## 2018-10-02 DIAGNOSIS — R0902 Hypoxemia: Secondary | ICD-10-CM | POA: Diagnosis not present

## 2018-10-15 DIAGNOSIS — Z1231 Encounter for screening mammogram for malignant neoplasm of breast: Secondary | ICD-10-CM | POA: Diagnosis not present

## 2018-11-01 DIAGNOSIS — R0902 Hypoxemia: Secondary | ICD-10-CM | POA: Diagnosis not present

## 2018-11-01 DIAGNOSIS — J449 Chronic obstructive pulmonary disease, unspecified: Secondary | ICD-10-CM | POA: Diagnosis not present

## 2018-12-02 DIAGNOSIS — R0902 Hypoxemia: Secondary | ICD-10-CM | POA: Diagnosis not present

## 2018-12-02 DIAGNOSIS — J449 Chronic obstructive pulmonary disease, unspecified: Secondary | ICD-10-CM | POA: Diagnosis not present

## 2018-12-22 DIAGNOSIS — M7918 Myalgia, other site: Secondary | ICD-10-CM | POA: Diagnosis not present

## 2018-12-22 DIAGNOSIS — I10 Essential (primary) hypertension: Secondary | ICD-10-CM | POA: Diagnosis not present

## 2018-12-22 DIAGNOSIS — J449 Chronic obstructive pulmonary disease, unspecified: Secondary | ICD-10-CM | POA: Diagnosis not present

## 2019-01-02 DIAGNOSIS — J449 Chronic obstructive pulmonary disease, unspecified: Secondary | ICD-10-CM | POA: Diagnosis not present

## 2019-01-02 DIAGNOSIS — R0902 Hypoxemia: Secondary | ICD-10-CM | POA: Diagnosis not present

## 2019-01-19 DIAGNOSIS — M25562 Pain in left knee: Secondary | ICD-10-CM | POA: Diagnosis not present

## 2019-01-19 DIAGNOSIS — M353 Polymyalgia rheumatica: Secondary | ICD-10-CM | POA: Diagnosis not present

## 2019-01-31 DIAGNOSIS — J449 Chronic obstructive pulmonary disease, unspecified: Secondary | ICD-10-CM | POA: Diagnosis not present

## 2019-01-31 DIAGNOSIS — R092 Respiratory arrest: Secondary | ICD-10-CM | POA: Diagnosis not present

## 2019-03-03 DIAGNOSIS — J449 Chronic obstructive pulmonary disease, unspecified: Secondary | ICD-10-CM | POA: Diagnosis not present

## 2019-03-03 DIAGNOSIS — R0902 Hypoxemia: Secondary | ICD-10-CM | POA: Diagnosis not present

## 2019-03-12 DIAGNOSIS — J449 Chronic obstructive pulmonary disease, unspecified: Secondary | ICD-10-CM | POA: Diagnosis not present

## 2019-03-12 DIAGNOSIS — I10 Essential (primary) hypertension: Secondary | ICD-10-CM | POA: Diagnosis not present

## 2019-06-11 DIAGNOSIS — E7800 Pure hypercholesterolemia, unspecified: Secondary | ICD-10-CM | POA: Diagnosis not present

## 2019-06-11 DIAGNOSIS — M353 Polymyalgia rheumatica: Secondary | ICD-10-CM | POA: Diagnosis not present

## 2019-06-11 DIAGNOSIS — I10 Essential (primary) hypertension: Secondary | ICD-10-CM | POA: Diagnosis not present

## 2019-06-11 DIAGNOSIS — J449 Chronic obstructive pulmonary disease, unspecified: Secondary | ICD-10-CM | POA: Diagnosis not present

## 2019-08-10 DIAGNOSIS — R600 Localized edema: Secondary | ICD-10-CM | POA: Diagnosis not present

## 2019-09-10 DIAGNOSIS — L304 Erythema intertrigo: Secondary | ICD-10-CM | POA: Diagnosis not present

## 2019-09-10 DIAGNOSIS — J449 Chronic obstructive pulmonary disease, unspecified: Secondary | ICD-10-CM | POA: Diagnosis not present

## 2019-09-10 DIAGNOSIS — I499 Cardiac arrhythmia, unspecified: Secondary | ICD-10-CM | POA: Diagnosis not present

## 2019-09-10 DIAGNOSIS — Z1389 Encounter for screening for other disorder: Secondary | ICD-10-CM | POA: Diagnosis not present

## 2019-09-10 DIAGNOSIS — Z0000 Encounter for general adult medical examination without abnormal findings: Secondary | ICD-10-CM | POA: Diagnosis not present

## 2019-09-10 DIAGNOSIS — Z23 Encounter for immunization: Secondary | ICD-10-CM | POA: Diagnosis not present

## 2019-09-10 DIAGNOSIS — I10 Essential (primary) hypertension: Secondary | ICD-10-CM | POA: Diagnosis not present

## 2019-11-06 ENCOUNTER — Other Ambulatory Visit: Payer: Self-pay

## 2019-11-06 ENCOUNTER — Encounter: Payer: Self-pay | Admitting: Emergency Medicine

## 2019-11-06 ENCOUNTER — Ambulatory Visit (INDEPENDENT_AMBULATORY_CARE_PROVIDER_SITE_OTHER): Payer: BC Managed Care – PPO | Admitting: Emergency Medicine

## 2019-11-06 DIAGNOSIS — I2729 Other secondary pulmonary hypertension: Secondary | ICD-10-CM | POA: Diagnosis not present

## 2019-11-06 DIAGNOSIS — Z1231 Encounter for screening mammogram for malignant neoplasm of breast: Secondary | ICD-10-CM | POA: Diagnosis not present

## 2019-11-06 NOTE — Progress Notes (Signed)
Virtual Visit via Telephone Note  I connected with Patricia Sampson on 11/06/19 at  9:30 AM EST by telephone and verified that I am speaking with the correct person using two identifiers.  Location: Patient: Home Provider: Office   I discussed the limitations, risks, security and privacy concerns of performing an evaluation and management service by telephone and the availability of in person appointments. I also discussed with the patient that there may be a patient responsible charge related to this service. The patient expressed understanding and agreed to proceed.   History of Present Illness: 63 year old woman with severe COPD and resultant chronic hypoxemic respiratory failure.  She has mild secondary pulmonary hypertension with an estimated PASP 38 mmHg by echocardiogram 01/06/2018, normal RV size and function.  Last time I saw her 12/25/2017 I changed her Spiriva to [employer].   Observations/Objective: She notes that her functional capacity has decreased in large part due to inactivity from isolation efforts. She was recently changed to pulsed flow O2 by Dr Burnett Sheng because tanks were heavy. She has been wearing 4L/min > has desats on pulsed flow to 70's (we had seen this before). She has had increasing LE edema. She stopped using BD in March - she wants to go back to Spiriva.   Assessment and Plan: Need to get her back on BD, she wants to go with Spiriva, although I will ultimately tyry to get her on LABA/ LAMA. She may need nebs since she has UA irritation (that is why she stopped the Spiriva) She doesn't tolerate pulsed flow O2 >> need to change her back to continuous flow. We will try to get tanks that she can transport.   Follow Up Instructions: 1 month by phone.    I discussed the assessment and treatment plan with the patient. The patient was provided an opportunity to ask questions and all were answered. The patient agreed with the plan and demonstrated an understanding of the  instructions.   The patient was advised to call back or seek an in-person evaluation if the symptoms worsen or if the condition fails to improve as anticipated.  I provided 17 minutes of non-face-to-face time during this encounter.   Collene Gobble, MD

## 2019-11-10 DIAGNOSIS — J449 Chronic obstructive pulmonary disease, unspecified: Secondary | ICD-10-CM | POA: Diagnosis not present

## 2019-11-10 DIAGNOSIS — E7800 Pure hypercholesterolemia, unspecified: Secondary | ICD-10-CM | POA: Diagnosis not present

## 2019-11-10 DIAGNOSIS — I10 Essential (primary) hypertension: Secondary | ICD-10-CM | POA: Diagnosis not present

## 2019-11-17 ENCOUNTER — Encounter: Payer: Self-pay | Admitting: Pulmonary Disease

## 2019-11-17 ENCOUNTER — Ambulatory Visit (INDEPENDENT_AMBULATORY_CARE_PROVIDER_SITE_OTHER): Payer: BC Managed Care – PPO | Admitting: Pulmonary Disease

## 2019-11-17 ENCOUNTER — Telehealth: Payer: Self-pay | Admitting: Emergency Medicine

## 2019-11-17 ENCOUNTER — Telehealth: Payer: Self-pay

## 2019-11-17 DIAGNOSIS — R0902 Hypoxemia: Secondary | ICD-10-CM

## 2019-11-17 DIAGNOSIS — I2721 Secondary pulmonary arterial hypertension: Secondary | ICD-10-CM

## 2019-11-17 DIAGNOSIS — J449 Chronic obstructive pulmonary disease, unspecified: Secondary | ICD-10-CM | POA: Diagnosis not present

## 2019-11-17 NOTE — Telephone Encounter (Addendum)
Response from [place] w/ [employer]:  [name] sent to Harland Dingwall, [place]; [place], Patrice Paradise  Our processor advised her on the 11th about the liter flow change and that we were reviewing the poc order. Order has been sent to our Sales promotion account executive and he has noted we are out of stock on the poc's at this time. I will call the patient and let her know.   Provided [name] w/ this info.  Pt has appt w/ [name] tomorrow, 11/18/2019.

## 2019-11-17 NOTE — Telephone Encounter (Signed)
Spoke with pt, c/o increased sob with any exertion, prod cough with clear mucus- believes this is coming from her Stiolto inhaler.    Not taking anything to help with s/s. Scheduled for televisit with [name] today at 3:30.  Nothing further needed at this time- will close encounter.

## 2019-11-17 NOTE — Assessment & Plan Note (Addendum)
Struggling with Stiolto History of intolerance of inhalers in the past Has a globus sensation and upper airway irritation from the Stiolto inhaler  Plan: Okay to hold Stiolto >>> May need to consider Pulmicort and DuoNeb nebs in place of Stiolto We will have patient present to our office tomorrow for further evaluation Likely would benefit from having a chest x-ray Needs pulmonary function testing Needs a walk in office

## 2019-11-17 NOTE — Progress Notes (Addendum)
_0  ID: Patricia Sampson, female    DOB: [birth year], 62 y.o.   MRN: [id number]  Chief Complaint  Patient presents with  . Follow-up    F/U for O2 needs. States Patricia Sampson has the pulsed POC at home. Uses [employer] as her DME. Productive cough with yellow phlegm.     Referring provider: Greig Right, MD  HPI:  63 year old female former smoker followed in our office for COPD  Past medical history: Pulmonary arterial hypertension Smoking history: Former smoker Maintenance: Stiolto Respimat Patient: [name]   11/18/2019  - Visit   63 year old female former smoker followed in our office for COPD.  Patient completing 1 day follow-up from a telephonic visit yesterday due to concerns over worsened hypoxia.  Patient was requested to come into our office for an in person evaluation.  Walk in office today was unable to be completed due to the patient's persistent hypoxia.  Patient was finally able to be maintained on 6 L continuous to maintain oxygen saturations above 90%.  Patient reports Patricia Sampson does have a concentrator at home which has the capacity go up to 10 L.  We have already discussed with [employer] her DME company today that they need to follow-up with the patient regarding education regarding how to use this.  Patient continues to be maintained on Stiolto Respimat.  Although this does cause upper airway irritation Patricia Sampson is wondering if the inhaler is working properly but Patricia Sampson is just been so hypoxic that caused her symptoms.  Patient reporting that lower extremity swelling and weights have gone up.  Patricia Sampson is currently taking chlorthalidone which is managed by her primary care doctor Dr. Burnett Sheng.  We do not have any baseline lab work or x-ray imaging on the patient.  We will discuss and work on this today.  Patient did bring her pulse oximeter which is reading around 83%.  When we placed a forehead probe on the patient Patricia Sampson was satting around 94% on 6 L.  Need to utilize forehead probe when assessing her  oxygen levels.  Pulse oximetry on fingers were not accurate.  Patient presents today with her son.  Questionaires / Pulmonary Flowsheets:   MMRC: mMRC Dyspnea Scale mMRC Score  11/18/2019 4    Tests:  04/26/2015-spirometry-FVC 1.8 (56% predicted), ratio 46, FEV1 0.8 (33% predicted)  Echocardiogram 01/06/2018-echocardiogram-LV ejection fraction 60 to 18%, grade 1 diastolic dysfunction, PA pressure 38   FENO:  No results found for: NITRICOXIDE  PFT: No flowsheet data found.  WALK:  SIX MIN WALK 11/18/2019 01/27/2014  Supplimental Oxygen during Test? (L/min) Yes No  O2 Flow Rate 4 -  Type Continuous -    Imaging: DG Chest 2 View  Result Date: 11/18/2019 CLINICAL DATA:  Dyspnea, COPD, smoker EXAM: CHEST - 2 VIEW COMPARISON:  04/23/2017 chest radiograph. FINDINGS: Surgical hardware from ACDF overlies the lower cervical spine. Stable cardiomediastinal silhouette with normal heart size. No pneumothorax. No pleural effusion. Hyperinflated lungs. Patchy bilateral parahilar lung opacities appear new, including a more focal new opacity in the upper left lung. IMPRESSION: 1. New patchy bilateral parahilar and focal upper left lung opacities, suspicious for pneumonia. Short-term post treatment chest radiograph follow-up advised to document clearance. 2. Hyperinflated lungs, compatible with the reported history of COPD. Electronically Signed   By: Ilona Sorrel M.D.   On: 11/18/2019 12:44    Lab Results:  CBC No results found for: WBC, RBC, HGB, HCT, PLT, MCV, MCH, MCHC, RDW, LYMPHSABS, MONOABS, EOSABS, BASOSABS  BMET No results  found for: NA, K, CL, CO2, GLUCOSE, BUN, CREATININE, CALCIUM, GFRNONAA, GFRAA  BNP No results found for: BNP  ProBNP No results found for: PROBNP  Specialty Problems      Pulmonary Problems   COPD (chronic obstructive pulmonary disease) (HCC)    04/26/2015-spirometry-FVC 1.8 (56% predicted), ratio 46, FEV1 0.8 (33% predicted)      Chronic respiratory  failure with hypoxia (HCC)   Shortness of breath      Allergies  Allergen Reactions  . Dyazide [Hydrochlorothiazide W-Triamterene]     hives  . Zestoretic [Lisinopril-Hydrochlorothiazide]     Swelling     Immunization History  Administered Date(s) Administered  . Influenza Split 08/26/2013, 08/26/2017  . Influenza,inj,Quad PF,6+ Mos 08/25/2015, 08/27/2016, 08/26/2019  . Pneumococcal Polysaccharide-23 12/27/2013    Past Medical History:  Diagnosis Date  . Angioedema   . COPD (chronic obstructive pulmonary disease) ([place])   . FHx: migraine headaches   . Hyperlipidemia   . Hypertension   . Seasonal allergies     Tobacco History: Social History   Tobacco Use  Smoking Status Former Smoker  . Packs/day: 1.00  . Years: 30.00  . Pack years: 30.00  . Types: Cigarettes  . Quit date: 11/26/2000  . Years since quitting: 18.9  Smokeless Tobacco Never Used   Counseling given: Not Answered  Continue to not smoke  Outpatient Encounter Medications as of 11/18/2019  Medication Sig  . acetaminophen (TYLENOL) 500 MG tablet Take 500 mg by mouth every 6 (six) hours as needed.  Marland Kitchen amLODipine (NORVASC) 2.5 MG tablet Take 2.5 mg by mouth daily.  . chlorthalidone (HYGROTON) 25 MG tablet Take 25 mg by mouth daily.  . predniSONE (DELTASONE) 10 MG tablet Take 15 mg by mouth daily with breakfast.  . [DISCONTINUED] amLODipine (NORVASC) 5 MG tablet Take 5 mg by mouth daily.  . Tiotropium Bromide-Olodaterol (STIOLTO RESPIMAT) 2.5-2.5 MCG/ACT AERS Inhale 2 puffs into the lungs daily.   No facility-administered encounter medications on file as of 11/18/2019.     Review of Systems  Review of Systems  Constitutional: Positive for activity change and fatigue. Negative for fever.  HENT: Negative for sinus pressure, sinus pain and sore throat.   Respiratory: Positive for shortness of breath. Negative for cough and wheezing.   Cardiovascular: Positive for leg swelling. Negative for chest pain  and palpitations.  Gastrointestinal: Negative for diarrhea, nausea and vomiting.  Musculoskeletal: Negative for arthralgias.  Neurological: Negative for dizziness.  Psychiatric/Behavioral: Negative for sleep disturbance. The patient is not nervous/anxious.      Physical Exam  BP 124/64 (BP Location: Right Arm, Patient Position: Sitting, Cuff Size: Large)   Pulse (!) 108   Temp 97.9 F (36.6 C) (Temporal)   Ht _0  (1.753 m)   Wt 218 lb (98.9 kg)   SpO2 91% Comment: 6L  BMI 32.19 kg/m   Wt Readings from Last 5 Encounters:  11/18/19 218 lb (98.9 kg)  10/28/17 207 lb 6.4 oz (94.1 kg)  04/30/17 197 lb 3.2 oz (89.4 kg)  02/20/17 197 lb (89.4 kg)  10/15/16 198 lb (89.8 kg)    BMI Readings from Last 5 Encounters:  11/18/19 32.19 kg/m  10/28/17 30.63 kg/m  04/30/17 28.70 kg/m  02/20/17 28.67 kg/m  10/15/16 28.82 kg/m     Physical Exam Vitals and nursing note reviewed.  Constitutional:      General: Patricia Sampson is not in acute distress.    Appearance: Patricia Sampson is obese.  HENT:     Head: Normocephalic and  atraumatic.     Right Ear: Tympanic membrane, ear canal and external ear normal. There is no impacted cerumen.     Left Ear: Tympanic membrane, ear canal and external ear normal. There is no impacted cerumen.     Nose: Rhinorrhea present. No congestion.     Mouth/Throat:     Mouth: Mucous membranes are moist.     Pharynx: Oropharynx is clear.  Eyes:     Pupils: Pupils are equal, round, and reactive to light.  Cardiovascular:     Rate and Rhythm: Regular rhythm. Tachycardia present.     Pulses: Normal pulses.     Heart sounds: No murmur.  Pulmonary:     Effort: Pulmonary effort is normal. No respiratory distress.     Breath sounds: No decreased air movement. No decreased breath sounds or rales.  Abdominal:     General: Abdomen is flat. Bowel sounds are normal.     Palpations: Abdomen is soft.     Comments: Questionable abdominal edema  Musculoskeletal:     Cervical back:  Normal range of motion.     Right lower leg: 3+ Pitting Edema present.     Left lower leg: 3+ Pitting Edema present.     Comments: In wheelchair today  Skin:    General: Skin is warm and dry.     Capillary Refill: Capillary refill takes less than 2 seconds.  Neurological:     General: No focal deficit present.     Mental Status: Patricia Sampson is alert and oriented to person, place, and time. Mental status is at baseline.     Gait: Gait abnormal (Unable to complete walk completely due to hypoxia).  Psychiatric:        Mood and Affect: Mood normal.        Behavior: Behavior normal.        Thought Content: Thought content normal.        Judgment: Judgment normal.       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[place]) Plan: Chest x-ray today Lab work today Continue chlorthalidone at this time, likely will need additional diuresis Start weighing yourself daily Follow low-sodium diet Continue oxygen therapy as prescribed: 6 L at rest, 8 to 10 L with exertion Maintain oxygen saturations above 88 to 90% Complete pulmonary function testing as ordered  Chronic respiratory failure with hypoxia (HCC) Patient required 6 L of oxygen at rest today to maintain his oxygen saturations above 88% Patient will likely require between 8 to 10 L of oxygen with physical exertion  Plan: [employer] [facility] to follow-up with the patient regarding her oxygen needs Patient discharged from our office today with an oxygen tank in order to maintain oxygen saturations at 6 L Patient will need 1 week follow-up Chest x-ray today Lab work today   COPD (chronic obstructive pulmonary disease) ([place]) Plan: Continue Stiolto at this time Samples provided today Obtain pulmonary function testing Close follow-up with our office in 1 week  Shortness of breath Likely fluid overloaded due to pulmonary hypertension  Plan: Lab work today Chest x-ray today Sent home with oxygen tank in order to maintain  oxygen saturations above 88 to 90% Likely will need change to diuretics Patient may also need referral to cardiology May need repeat echocardiogram based off of lab work 1 week follow-up with our office    Return in about 1 week (around 11/25/2019), or if symptoms worsen or fail to improve, for Follow up with [name] FNP-C, Follow  up for PFT.   [name], NP 11/18/2019   This appointment was 45 minutes long with over 50% of the time in direct face-to-face patient care, assessment, plan of care, and follow-up.

## 2019-11-17 NOTE — Progress Notes (Signed)
Virtual Visit via Telephone Note  I connected with Patricia Sampson on 11/17/19 at  3:30 PM EST by telephone and verified that I am speaking with the correct person using two identifiers.  Location: Patient: Home Provider: [facility] - [address], [place], [place], [place] [address]   I discussed the limitations, risks, security and privacy concerns of performing an evaluation and management service by telephone and the availability of in person appointments. I also discussed with the patient that there may be a patient responsible charge related to this service. The patient expressed understanding and agreed to proceed.  Patient consented to consult via telephone: Yes People present and their role in pt care: Pt   History of Present Illness:  63 year old female former smoker followed in our office for COPD  Past medical history: Pulmonary arterial hypertension Smoking history: Former smoker Maintenance: Stiolto Respimat Patient: [name]   Chief complaint: Cough, globus sensation   Patient recently completed a televisit with [name] earlier in December.  She was placed on Stiolto at that time.  She has a history of intolerance of inhalers due to upper airway irritation.  This is why she stopped Spiriva.  She was started empirically on Stiolto and encouraged to follow-up in about 4 weeks.  Patient contacted our office to report that she is continuing to have a dry cough as well as a globus sensation.  She is having a productive cough with clear mucus.  She also reports that her oxygen levels are "terrible" unfortunately she is unable to fully monitor or trust her pulse oximeter.  She reports that she thinks that her pulse oximeter may be broken at home.  She reports her oxygen saturations are ranging between 70 and 85%.  There is an order that was placed earlier this month for [employer] to evaluate her oxygen needs.  She reports that no one has contacted her regarding  this.  Observations/Objective:  04/26/2015-spirometry-FVC 1.8 (56% predicted), ratio 46, FEV1 0.8 (33% predicted)  Echocardiogram 01/06/2018-echocardiogram-LV ejection fraction 60 to 49%, grade 1 diastolic dysfunction, PA pressure 38  Social History   Tobacco Use  Smoking Status Former Smoker  . Packs/day: 1.00  . Years: 30.00  . Pack years: 30.00  . Types: Cigarettes  . Quit date: 11/26/2000  . Years since quitting: 18.9  Smokeless Tobacco Never Used   Immunization History  Administered Date(s) Administered  . Influenza Split 08/26/2013, 08/26/2017  . Influenza,inj,Quad PF,6+ Mos 08/25/2015, 08/27/2016, 08/26/2019  . Pneumococcal Polysaccharide-23 12/27/2013    Assessment and Plan:  COPD (chronic obstructive pulmonary disease) Struggling with Stiolto History of intolerance of inhalers in the past Has a globus sensation and upper airway irritation from the Stiolto inhaler  Plan: Okay to hold Stiolto >>> May need to consider Pulmicort and DuoNeb nebs in place of Stiolto We will have patient present to our office tomorrow for further evaluation Likely would benefit from having a chest x-ray Needs pulmonary function testing Needs a walk in office  Hypoxemia Plan: Present to our office tomorrow for a walk test to further evaluate oxygen needs I will have the patient care coordinators look at the order that was placed earlier this month with [employer] and see why [employer] has not followed up with you regarding your oxygen  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   I discussed the assessment and treatment plan with the patient. The patient was provided an opportunity to ask questions and all were answered. The patient agreed with the plan and demonstrated an understanding  of the instructions.   The patient was advised to call back or seek an in-person evaluation if the symptoms worsen or if the condition fails to improve as anticipated.  I provided 22 minutes of non-face-to-face time during this encounter.   [name], NP

## 2019-11-17 NOTE — Assessment & Plan Note (Signed)
Plan: Present to our office tomorrow for a walk test to further evaluate oxygen needs I will have the patient care coordinators look at the order that was placed earlier this month with [employer] and see why [employer] has not followed up with you regarding your oxygen

## 2019-11-17 NOTE — Telephone Encounter (Signed)
error 

## 2019-11-17 NOTE — Telephone Encounter (Signed)
close

## 2019-11-17 NOTE — Assessment & Plan Note (Signed)
Plan: Patient needs in person evaluation tomorrow

## 2019-11-17 NOTE — Patient Instructions (Addendum)
You were seen today by [name], NP  for:   1. Chronic obstructive pulmonary disease, unspecified COPD type ([place])  - Pulmonary function test; Future  Follow-up with our office tomorrow for in person evaluation You need a walk test I have ordered a pulmonary function test  2. Secondary pulmonary arterial hypertension (HCC)  We will continue to monitor clinically  3. Hypoxemia  Needs walk test in our office    We recommend today:  Orders Placed This Encounter  Procedures  . Pulmonary function test    Standing Status:   Future    Standing Expiration Date:   11/16/2020    Order Specific Question:   Where should this test be performed?    Answer:   [facility]    Order Specific Question:   Full PFT: includes the following: basic spirometry, spirometry pre & post bronchodilator, diffusion capacity (DLCO), lung volumes    Answer:   Full PFT   Orders Placed This Encounter  Procedures  . Pulmonary function test   No orders of the defined types were placed in this encounter.   Follow Up:    Return in about 1 day (around 11/18/2019), or if symptoms worsen or fail to improve, for Follow up with [name] FNP-C.   Please do your part to reduce the spread of COVID-19:      Reduce your risk of any infection  and COVID19 by using the similar precautions used for avoiding the common cold or flu:  Marland Kitchen Wash your hands often with soap and warm water for at least 20 seconds.  If soap and water are not readily available, use an alcohol-based hand sanitizer with at least 60% alcohol.  . If coughing or sneezing, cover your mouth and nose by coughing or sneezing into the elbow areas of your shirt or coat, into a tissue or into your sleeve (not your hands). Langley Gauss A MASK when in public  . Avoid shaking hands with others and consider head nods or verbal greetings only. . Avoid touching your eyes, nose, or mouth with unwashed hands.  . Avoid close contact with people who are  sick. . Avoid places or events with large numbers of people in one location, like concerts or sporting events. . If you have some symptoms but not all symptoms, continue to monitor at home and seek medical attention if your symptoms worsen. . If you are having a medical emergency, call 911.   [place] / e-Visit: [email or web address]         [facility]: [facility]: [phone]                   [facility]: [phone]     It is flu season:   >>> Best ways to protect herself from the flu: Receive the yearly flu vaccine, practice good hand hygiene washing with soap and also using hand sanitizer when available, eat a nutritious meals, get adequate rest, hydrate appropriately   Please contact the office if your symptoms worsen or you have concerns that you are not improving.   Thank you for choosing [facility] for your healthcare, and for allowing [place] to partner with you on your healthcare journey. I am thankful to be able to provide care to you today.   [name] FNP-C

## 2019-11-18 ENCOUNTER — Encounter: Payer: Self-pay | Admitting: Pulmonary Disease

## 2019-11-18 ENCOUNTER — Ambulatory Visit (INDEPENDENT_AMBULATORY_CARE_PROVIDER_SITE_OTHER): Payer: BC Managed Care – PPO

## 2019-11-18 ENCOUNTER — Ambulatory Visit (INDEPENDENT_AMBULATORY_CARE_PROVIDER_SITE_OTHER): Payer: BC Managed Care – PPO | Admitting: Pulmonary Disease

## 2019-11-18 ENCOUNTER — Telehealth: Payer: Self-pay | Admitting: Pulmonary Disease

## 2019-11-18 ENCOUNTER — Other Ambulatory Visit: Payer: Self-pay

## 2019-11-18 VITALS — BP 124/64 | HR 108 | Temp 97.9°F | Ht 69.0 in | Wt 218.0 lb

## 2019-11-18 DIAGNOSIS — R0902 Hypoxemia: Secondary | ICD-10-CM | POA: Diagnosis not present

## 2019-11-18 DIAGNOSIS — J9611 Chronic respiratory failure with hypoxia: Secondary | ICD-10-CM

## 2019-11-18 DIAGNOSIS — R0602 Shortness of breath: Secondary | ICD-10-CM

## 2019-11-18 DIAGNOSIS — I2721 Secondary pulmonary arterial hypertension: Secondary | ICD-10-CM | POA: Diagnosis not present

## 2019-11-18 DIAGNOSIS — J449 Chronic obstructive pulmonary disease, unspecified: Secondary | ICD-10-CM | POA: Diagnosis not present

## 2019-11-18 DIAGNOSIS — J189 Pneumonia, unspecified organism: Secondary | ICD-10-CM

## 2019-11-18 LAB — CBC WITH DIFFERENTIAL/PLATELET
Basophils Absolute: 0 10*3/uL (ref 0.0–0.1)
Basophils Relative: 0.4 % (ref 0.0–3.0)
Eosinophils Absolute: 0 10*3/uL (ref 0.0–0.7)
Eosinophils Relative: 0.2 % (ref 0.0–5.0)
HCT: 33.3 % — ABNORMAL LOW (ref 36.0–46.0)
Hemoglobin: 10.5 g/dL — ABNORMAL LOW (ref 12.0–15.0)
Lymphocytes Relative: 14 % (ref 12.0–46.0)
Lymphs Abs: 0.7 10*3/uL (ref 0.7–4.0)
MCHC: 31.5 g/dL (ref 30.0–36.0)
MCV: 83.7 fl (ref 78.0–100.0)
Monocytes Absolute: 0.5 10*3/uL (ref 0.1–1.0)
Monocytes Relative: 9.9 % (ref 3.0–12.0)
Neutro Abs: 3.7 10*3/uL (ref 1.4–7.7)
Neutrophils Relative %: 75.5 % (ref 43.0–77.0)
Platelets: 233 10*3/uL (ref 150.0–400.0)
RBC: 3.98 Mil/uL (ref 3.87–5.11)
RDW: 15 % (ref 11.5–15.5)
WBC: 4.9 10*3/uL (ref 4.0–10.5)

## 2019-11-18 LAB — COMPREHENSIVE METABOLIC PANEL
ALT: 22 U/L (ref 0–35)
AST: 48 U/L — ABNORMAL HIGH (ref 0–37)
Albumin: 3.8 g/dL (ref 3.5–5.2)
Alkaline Phosphatase: 39 U/L (ref 39–117)
BUN: 19 mg/dL (ref 6–23)
CO2: 44 mEq/L — ABNORMAL HIGH (ref 19–32)
Calcium: 9.2 mg/dL (ref 8.4–10.5)
Chloride: 76 mEq/L — ABNORMAL LOW (ref 96–112)
Creatinine, Ser: 0.87 mg/dL (ref 0.40–1.20)
GFR: 79.81 mL/min (ref >60.00)
Glucose, Bld: 109 mg/dL — ABNORMAL HIGH (ref 70–99)
Potassium: 3 mEq/L — ABNORMAL LOW (ref 3.5–5.1)
Sodium: 128 mEq/L — ABNORMAL LOW (ref 135–145)
Total Bilirubin: 0.5 mg/dL (ref 0.2–1.2)
Total Protein: 7.2 g/dL (ref 6.0–8.3)

## 2019-11-18 LAB — BRAIN NATRIURETIC PEPTIDE: Pro B Natriuretic peptide (BNP): 28 pg/mL (ref 0.0–100.0)

## 2019-11-18 IMAGING — DX DG CHEST 2V
2 series · 2 of 2 positions shown · non-contrast
Comparison: [DATE] chest radiograph.

CLINICAL DATA: Dyspnea, COPD, smoker

EXAM:
CHEST - 2 VIEW

[chest pa]
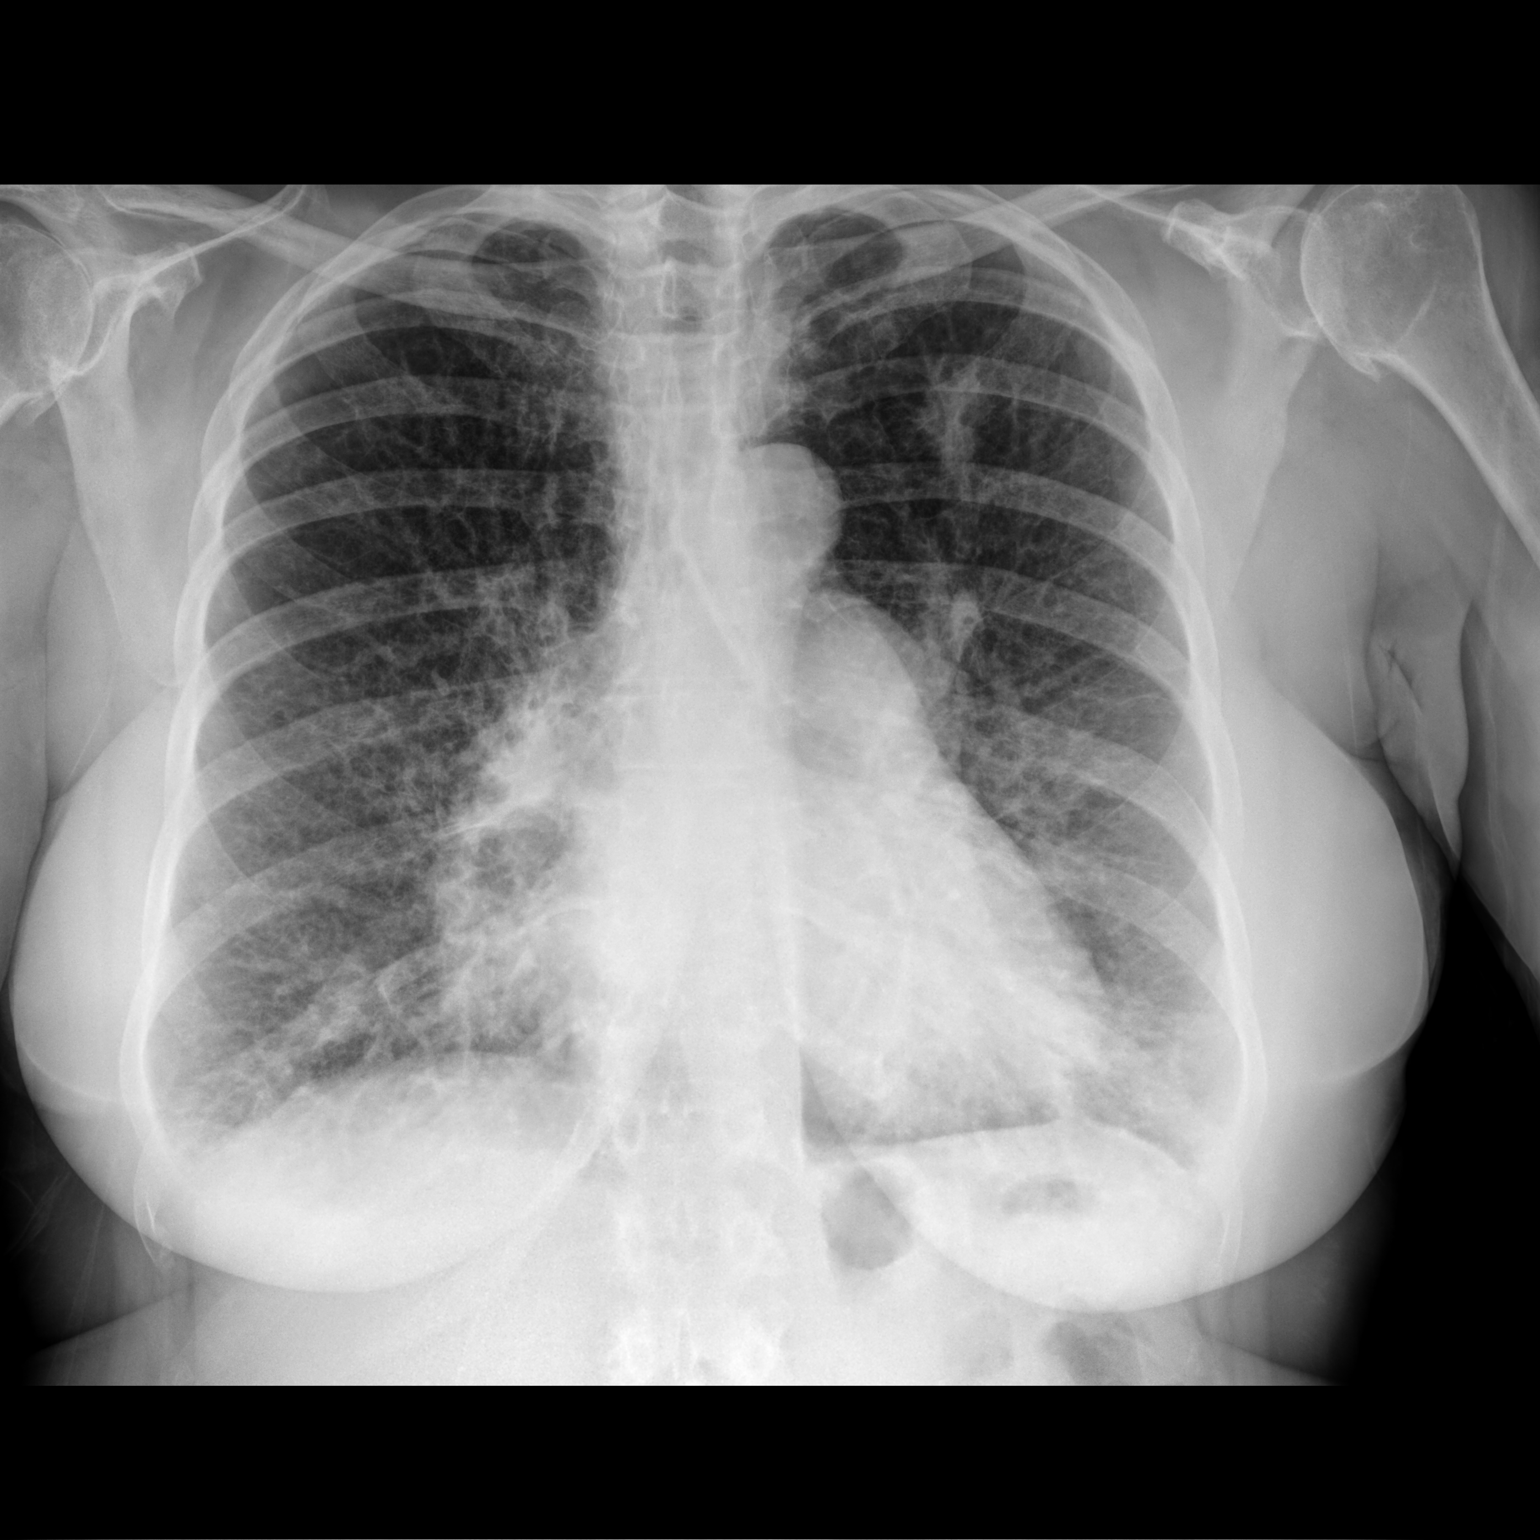

[chest lat]
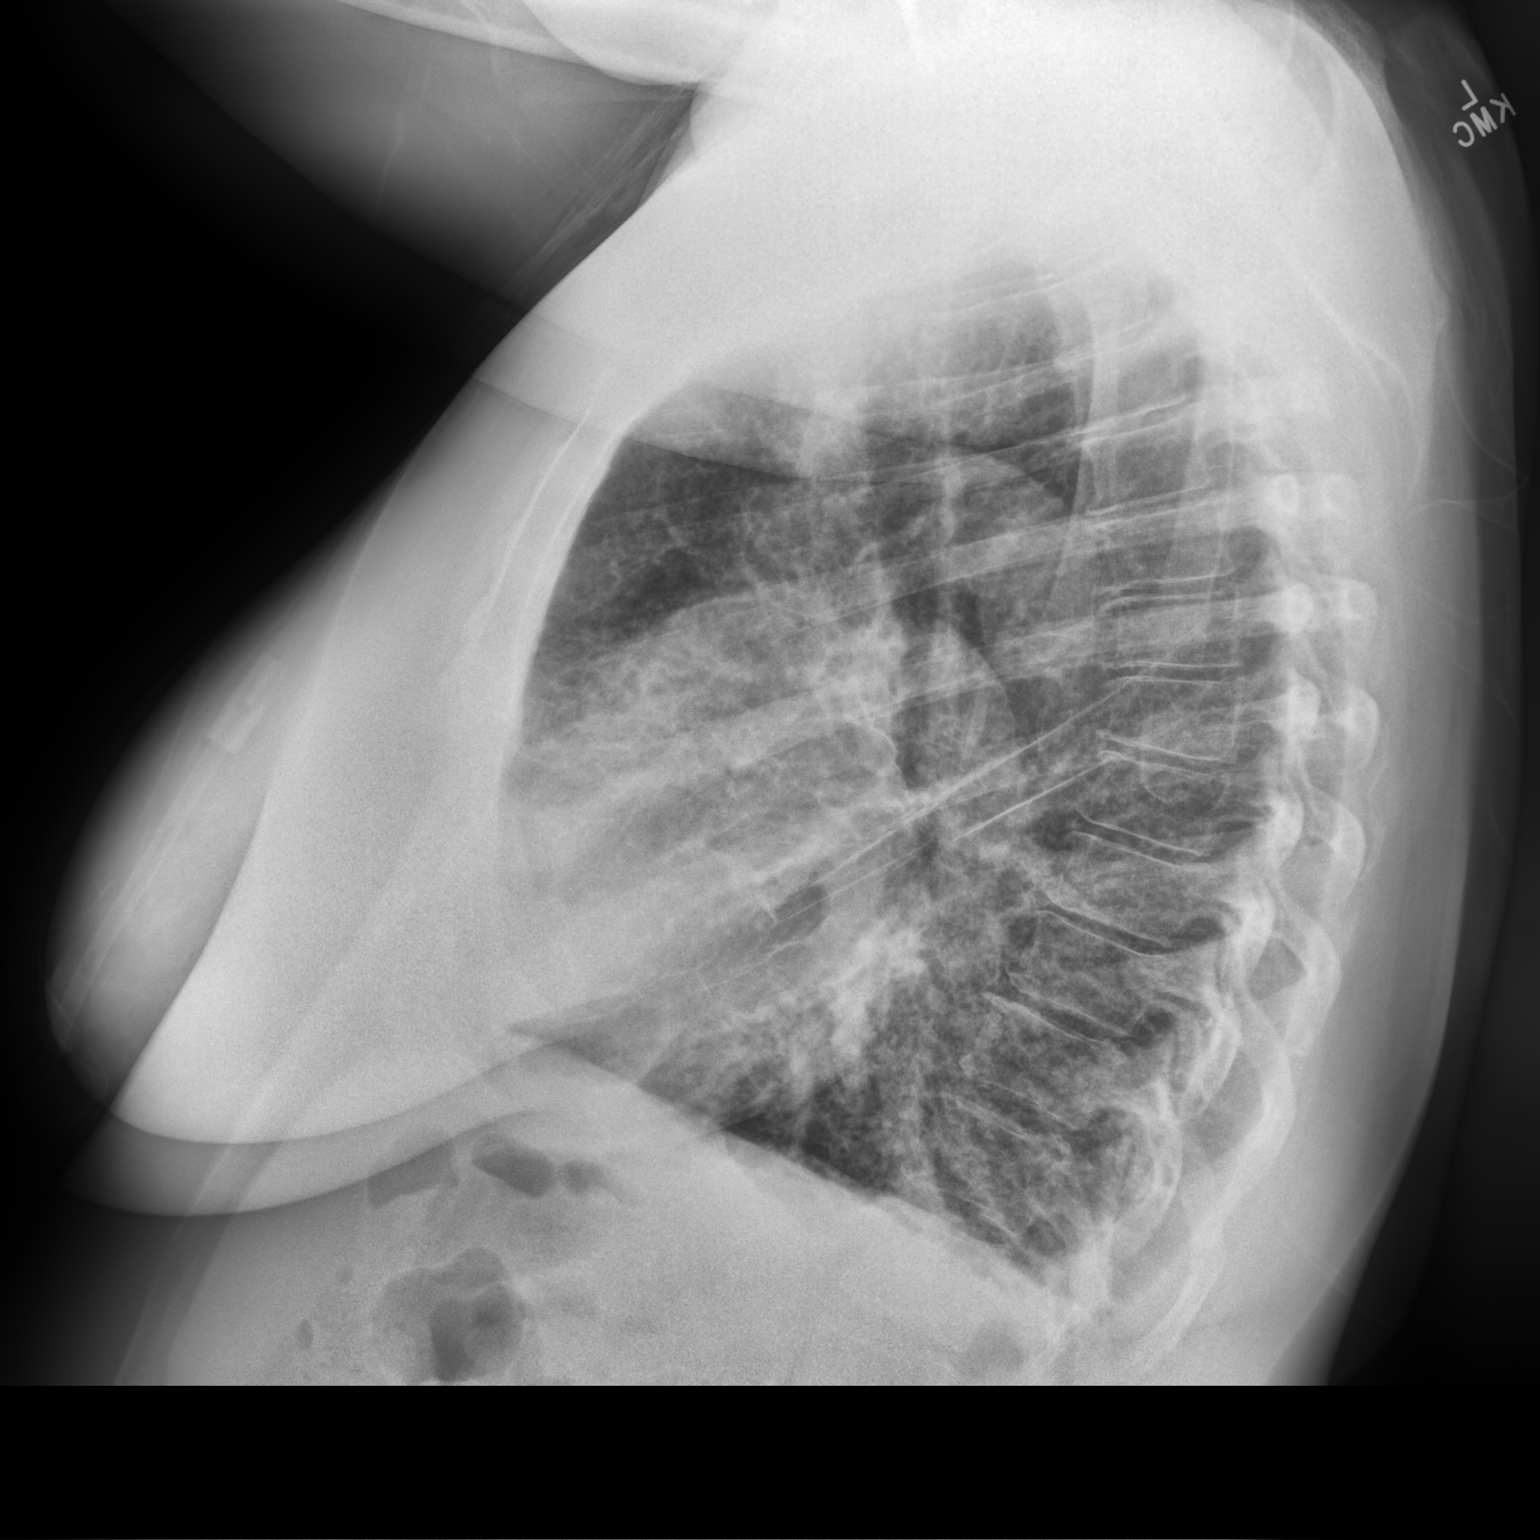

[2 of 2 positions shown; findings below may reference images not displayed]

FINDINGS: Surgical hardware from ACDF overlies the lower cervical spine.
Stable cardiomediastinal silhouette with normal heart size. No
pneumothorax. No pleural effusion. Hyperinflated lungs. Patchy
bilateral parahilar lung opacities appear new, including a more
focal new opacity in the upper left lung.
IMPRESSION: 1. New patchy bilateral parahilar and focal upper left lung
opacities, suspicious for pneumonia. Short-term post treatment chest
radiograph follow-up advised to document clearance.
2. Hyperinflated lungs, compatible with the reported history of
COPD.

## 2019-11-18 MED ORDER — STIOLTO RESPIMAT 2.5-2.5 MCG/ACT IN AERS: 2.0000 | INHALATION_SPRAY | Freq: Every day | RESPIRATORY_TRACT | 0 refills | Status: DC

## 2019-11-18 MED ORDER — LEVOFLOXACIN 500 MG PO TABS: 500.0000 mg | ORAL_TABLET | Freq: Every day | ORAL | 0 refills | Status: DC

## 2019-11-18 NOTE — Telephone Encounter (Signed)
11/18/2019 1302  Patient was seen in office today.  She requires 6 L cont at baseline. NOT PULSED. She was discharged with one of our tanks.  She is reporting today that she has a oxygen concentrator that has the capacity of 10 L.  She needs to be followed up with by [employer] likely sometime within the next 24 hours to ensure she has all of the oxygen supplies that she needs.  She should be maintained at 6 L at baseline as well as 8 to 10 L with physical exertion.  [employer] needs to notify our office after they have completed the follow-up with the patient.  PCCs can we ensure this happens?  [name], FNP

## 2019-11-18 NOTE — Telephone Encounter (Signed)
Spoke to [place] at [employer].  She states pt has a home fill concentrator with 2 tanks and a large backkup tank.  She is going to call the pt & find out what she has and she states she will call me back.

## 2019-11-18 NOTE — Telephone Encounter (Signed)
[name] called back.  She states pt told her she does have home fill system and she has 1 tank to fill up and the rest are pulse does  [name] states they are not showing pt has any pulsed dose tanks.  She is putting in a re-education ticket for pt for someone to go out and help her figure out the equipment she has.  She is going to bring the tank she has with her today and [name] told her she can use our O2 while she is here ( I had told her she could do that).  Nothing further needed.

## 2019-11-18 NOTE — Assessment & Plan Note (Signed)
Patient required 6 L of oxygen at rest today to maintain his oxygen saturations above 88% Patient will likely require between 8 to 10 L of oxygen with physical exertion  Plan: [employer] [facility] to follow-up with the patient regarding her oxygen needs Patient discharged from our office today with an oxygen tank in order to maintain oxygen saturations at 6 L Patient will need 1 week follow-up Chest x-ray today Lab work today

## 2019-11-18 NOTE — Addendum Note (Signed)
Addended by: [name] on: 11/18/2019 01:23 PM   Modules accepted: Orders

## 2019-11-18 NOTE — Assessment & Plan Note (Signed)
Likely fluid overloaded due to pulmonary hypertension  Plan: Lab work today Chest x-ray today Sent home with oxygen tank in order to maintain oxygen saturations above 88 to 90% Likely will need change to diuretics Patient may also need referral to cardiology May need repeat echocardiogram based off of lab work 1 week follow-up with our office

## 2019-11-18 NOTE — Assessment & Plan Note (Signed)
Plan: Chest x-ray today Lab work today Continue chlorthalidone at this time, likely will need additional diuresis Start weighing yourself daily Follow low-sodium diet Continue oxygen therapy as prescribed: 6 L at rest, 8 to 10 L with exertion Maintain oxygen saturations above 88 to 90% Complete pulmonary function testing as ordered

## 2019-11-18 NOTE — Telephone Encounter (Signed)
Called and spoke with pt who stated she only has one tank of oxygen.   Pt said she does have small tanks which she said are portable tanks that provide her pulse oxygen but pt is unable to use those due to using about 4L continuous.  Pt said when she uses the pulse flow, her O2 sats drop down to the mid 60s.  Pt said at last visit with RB, she thought an order was placed for her to receive more tanks from [facility] but pt said she has not heard anything from them.  Pt is coming today at 11:30 to see [name] and she is so afraid that she will not have enough O2 to get her to the office and then back home after the appt. I stated to pt that when she comes for her appt, we can immediately place her on one of our tanks and then also send her home on one of our loaner [employer] tanks if we had to and pt verbalized understanding.  I stated to pt that we would check to see what was going on and when she would be receiving more tanks from [facility]. PCCs, please advise on this. Thanks!

## 2019-11-18 NOTE — Progress Notes (Signed)
I have sent the antibiotics.  I have also personally contacted the patient and discussed the results with the patient as well as her son.  They are aware and they will pick up the antibiotics.  We will follow back up with the patient once we receive the lab work.  Nothing further is needed at this time.

## 2019-11-18 NOTE — Addendum Note (Signed)
Addended by: [name] on: 11/18/2019 04:38 PM   Modules accepted: Orders

## 2019-11-18 NOTE — Assessment & Plan Note (Signed)
Plan: Continue Stiolto at this time Samples provided today Obtain pulmonary function testing Close follow-up with our office in 1 week

## 2019-11-18 NOTE — Telephone Encounter (Signed)
I will call [employer].

## 2019-11-18 NOTE — Telephone Encounter (Signed)
I sent [name] with [employer] a high priority message and attached Brian's note stating this needed to be handled today

## 2019-11-18 NOTE — Patient Instructions (Addendum)
You were seen today by [name], NP  for:   1. Chronic obstructive pulmonary disease, unspecified COPD type ([place])  - DG Chest 2 View; Future - Comp Met (CMET); Future - CBC with Differential/Platelet; Future - B Nat Peptide; Future  Stiolto Respimat inhaler >>>2 puffs daily >>>Take this no matter what >>>This is not a rescue inhaler  Note your daily symptoms > remember "red flags" for COPD:   >>>Increase in cough >>>increase in sputum production >>>increase in shortness of breath or activity  intolerance.   If you notice these symptoms, please call the office to be seen.   2. Secondary pulmonary arterial hypertension (HCC)  - Comp Met (CMET); Future - CBC with Differential/Platelet; Future - B Nat Peptide; Future  3. Hypoxemia 4. Chronic respiratory failure with hypoxia (HCC)  Continue oxygen therapy as prescribed - 6L at rest, 8-10L with exertion  >>>maintain oxygen saturations greater than 88 percent  >>>if unable to maintain oxygen saturations please contact the office  >>>do not smoke with oxygen  >>>can use nasal saline gel or nasal saline rinses to moisturize nose if oxygen causes dryness   5. Shortness of breath  - DG Chest 2 View; Future - Comp Met (CMET); Future - CBC with Differential/Platelet; Future - B Nat Peptide; Future   We recommend today:  Orders Placed This Encounter  Procedures  . DG Chest 2 View    Standing Status:   Future    Number of Occurrences:   1    Standing Expiration Date:   01/18/2021    Order Specific Question:   Reason for Exam (SYMPTOM  OR DIAGNOSIS REQUIRED)    Answer:   short of breath    Order Specific Question:   Preferred imaging location?    Answer:   Internal    Order Specific Question:   Radiology Contrast Protocol - do NOT remove file path    Answer:   [email or web address]    Order Specific Question:   Release to patient    Answer:   Immediate  . Comp Met (CMET)    Standing  Status:   Future    Standing Expiration Date:   11/17/2020  . CBC with Differential/Platelet    Standing Status:   Future    Standing Expiration Date:   11/17/2020  . B Nat Peptide    Standing Status:   Future    Standing Expiration Date:   11/17/2020   Orders Placed This Encounter  Procedures  . DG Chest 2 View  . Comp Met (CMET)  . CBC with Differential/Platelet  . B Nat Peptide   No orders of the defined types were placed in this encounter.   Follow Up:    Return in about 1 week (around 11/25/2019), or if symptoms worsen or fail to improve, for Follow up with [name] FNP-C, Follow up for PFT.   Please do your part to reduce the spread of COVID-19:      Reduce your risk of any infection  and COVID19 by using the similar precautions used for avoiding the common cold or flu:  Marland Kitchen Wash your hands often with soap and warm water for at least 20 seconds.  If soap and water are not readily available, use an alcohol-based hand sanitizer with at least 60% alcohol.  . If coughing or sneezing, cover your mouth and nose by coughing or sneezing into the elbow areas of your shirt or coat, into a tissue or into your sleeve (not your  hands). Langley Gauss A MASK when in public  . Avoid shaking hands with others and consider head nods or verbal greetings only. . Avoid touching your eyes, nose, or mouth with unwashed hands.  . Avoid close contact with people who are sick. . Avoid places or events with large numbers of people in one location, like concerts or sporting events. . If you have some symptoms but not all symptoms, continue to monitor at home and seek medical attention if your symptoms worsen. . If you are having a medical emergency, call 911.   [place] / e-Visit: [email or web address]         [facility]: [facility]: [phone]                    [facility]: [phone]     It is flu season:   >>> Best ways to protect herself from the flu: Receive the yearly flu vaccine, practice good hand hygiene washing with soap and also using hand sanitizer when available, eat a nutritious meals, get adequate rest, hydrate appropriately   Please contact the office if your symptoms worsen or you have concerns that you are not improving.   Thank you for choosing [facility] for your healthcare, and for allowing [place] to partner with you on your healthcare journey. I am thankful to be able to provide care to you today.   [name] FNP-C

## 2019-11-19 ENCOUNTER — Other Ambulatory Visit: Payer: Self-pay

## 2019-11-19 MED ORDER — POTASSIUM CHLORIDE CRYS ER 20 MEQ PO TBCR: 40.0000 meq | EXTENDED_RELEASE_TABLET | Freq: Every day | ORAL | 0 refills | Status: DC

## 2019-11-21 ENCOUNTER — Other Ambulatory Visit (HOSPITAL_COMMUNITY)
Admission: RE | Admit: 2019-11-21 | Discharge: 2019-11-21 | Disposition: A | Discharge status: Home / Self Care | Payer: BC Managed Care – PPO | Source: Ambulatory Visit | Attending: Pulmonary Disease | Admitting: Pulmonary Disease

## 2019-11-21 DIAGNOSIS — U071 COVID-19: Secondary | ICD-10-CM | POA: Diagnosis not present

## 2019-11-21 DIAGNOSIS — Z01812 Encounter for preprocedural laboratory examination: Secondary | ICD-10-CM | POA: Insufficient documentation

## 2019-11-22 LAB — SARS CORONAVIRUS 2 (TAT 6-24 HRS): SARS Coronavirus 2: POSITIVE — AB

## 2019-11-23 ENCOUNTER — Telehealth: Payer: Self-pay | Admitting: Infectious Diseases

## 2019-11-23 ENCOUNTER — Telehealth: Payer: Self-pay | Admitting: *Deleted

## 2019-11-23 NOTE — Telephone Encounter (Signed)
Thank you Colletta [place].  C,  Can we get her scheduled this week for virtual visit. She may already be scheduled with [place]. If so nothing further needed.  B

## 2019-11-23 NOTE — Telephone Encounter (Signed)
Pt tested positive for Covid-19 12/26/ Pt has televist with RB on 1/11. Nothing further needed.

## 2019-11-23 NOTE — Telephone Encounter (Signed)
Called to discuss with patient about Covid symptoms and the use of bamlanivimab, a monoclonal antibody infusion for those with mild to moderate Covid symptoms and at a high risk of hospitalization.  Pt is qualified for this infusion at the [facility] infusion center due to Age >55 and COPD.    She first started with symptoms a few weeks prior to christmas. She has had increased shortness of breath and increased oxygen needs during this illness. Today is the best day she has had in a couple of weeks. She has been trying to get her strength back.   She is not a candidate for bamlanivimab given worsening oxygen need over the course of what seems to be a prolonged illness possibly due to COVID +/- bacterial pneumonia/COPD exacerbation.   They were asking me about refills for antibiotics - I asked them to please contact her pulmonology team as they are more familiar with her medical history and have seen her in person recently. Likely she does not need a refill and only supportive care now knowing COVID +.   Recommended her daughter in law (who lives at the house) get tested in 2-4 days since her employer is not having her stay home. She is not showing symptoms and wears a mask at work all times but has high degree of contact with others. Informed them it would ultimately be best if she can self isolate x 14d from her positive test but not sure that is doable with her work environment.

## 2019-11-24 NOTE — Telephone Encounter (Signed)
Spoke with patient. She stated that she is starting to feel better. Her O2 stats have started to return back to the 90s compared to last week.   She has been scheduled for a televisit tomorrow at [employer] . Per [name], ok to double book. She is aware that is a televisit and not an in office visit. Did advise her to monitor her symptoms and if her O2 sats start to slip into the mid 80s again to call 911. She verbalized understanding.

## 2019-11-24 NOTE — Progress Notes (Signed)
Virtual Visit via Telephone Note  I connected with Patricia Sampson on 11/25/19 at 11:00 AM EST by telephone and verified that I am speaking with the correct person using two identifiers.  Location: Patient: Home Provider: Office [facility] - [address], [place], [place], [place] [address]   I discussed the limitations, risks, security and privacy concerns of performing an evaluation and management service by telephone and the availability of in person appointments. I also discussed with the patient that there may be a patient responsible charge related to this service. The patient expressed understanding and agreed to proceed.  Patient consented to consult via telephone: Yes People present and their role in pt care: Pt     History of Present Illness:  64 year old female former smoker followed in our office for COPD  Past medical history: Pulmonary arterial hypertension Smoking history: Former smoker Maintenance: Stiolto Respimat, 39m prednisone daily  Patient: Patricia Sampson  Chief complaint: Recently Covid positive  63year old female former smoker followed in our office for COPD, pulmonary arterial hypertension.  Patient was seen in our office last week.  Patient was scheduled to complete pulmonary function testing and during that screen she was positive for SARS-CoV-2.  Patient has had worsened oxygen needs.  Her recently her oxygen was increased to 8 L at rest with 10 L with exertion.  She reports today she is maintained on 7 L and she reports her oxygen levels have been stable.  When I asked the patient to check her oxygen levels they were 81%.  Oxygen levels came up to 89% when patient was placed on 9 L.  Patient's oxygen concentrator could not go up to 10 L as it said that it was overheating and was beeping to shut off.  Patient is also on maintenance prednisone which has been managed by her primary care doctor.  We will work to try to get this stopped.  She has completed  a course of Levaquin as well as a course of potassium based off of lab work and chest x-ray imaging from last week.  With the chest x-ray showing bilateral infiltrates and her recent diagnosis of Covid we know now this is likely a viral pneumonia not bacterial.  She was not a candidate for the outpatient infusion therapy due to her worsened hypoxemia.  Observations/Objective:  11/21/2019 - SARS COV2 - covid+   04/26/2015-spirometry-FVC 1.8 (56% predicted), ratio 46, FEV1 0.8 (33% predicted)   Echocardiogram 01/06/2018-echocardiogram-LV ejection fraction 60 to 609% grade 1 diastolic dysfunction, PA pressure 38   Social History   Tobacco Use  Smoking Status Former Smoker  . Packs/day: 1.00  . Years: 30.00  . Pack years: 30.00  . Types: Cigarettes  . Quit date: 11/26/2000  . Years since quitting: 19.0  Smokeless Tobacco Never Used   Immunization History  Administered Date(s) Administered  . Influenza Split 08/26/2013, 08/26/2017  . Influenza,inj,Quad PF,6+ Mos 08/25/2015, 08/27/2016, 08/26/2019  . Pneumococcal Polysaccharide-23 12/27/2013      Assessment and Plan:  Secondary pulmonary arterial hypertension (HCC) Plan: Maintain oxygen levels above 88 percent  Follow up with DME company about oxygen needs    Chronic respiratory failure with hypoxia (HCC) Plan: Maintain oxygen levels above 88 percent  Increased O2 to 9L  Needs follow up with [employer] next week   I had an extended discussion with the patient as well as the patient's son regarding her positive diagnosis as well as difficult abilities to maintain oxygen saturations above  88%.  We are aware that her pulse oximeter was running about 6 points lower than our forehead probe in our office last week.  Still though with patient's oxygen levels barely being maintained above 88% on 9 L this is high risk as patient is likely towards the 10-day mark of Covid which is where she is at her highest likelihood of decompensating  having worsened hypoxemia.  I recommended the patient be evaluated in the emergency room via EMS to maintain oxygen saturations appropriately as well as to ensure the patient has a care that she needs.  Patient declined.  Patient son also declined.  They have concerns regarding the isolation as well as her mental health for this.  We reviewed multiple times things to monitor as well as that if patient worsens at all she will need to present to the emergency room.  They agreed.  We will also have close follow-up with our office as the patient has declined I recommendation for EMS/ER evaluation  COPD (chronic obstructive pulmonary disease) ([place]) Plan: Continue Stiolto at this time reschedule pulmonary function testing Start prednisone 10 mg daily for 2 weeks, then decrease to 5 mg daily for 2 weeks, then stop completely Close follow-up with our office next week with virtual office visit  Pneumonia due to COVID-19 virus Recent imaging findings of bilateral pneumonia  Treated with levaquin   Discussion:  O2 sats 81 percent sitting on 7L. Worsening hypoxemia. Covid19+, bilateral infiltrates, completed levaquin   Plan:  Recommend ER eval via 911   Hypoxemia Discussion:  I recommended ER eval via ems today. Pt and son declined.  They are concerned about the isolation.  Patient feels that her oxygen levels are maintained well at home.  Patient reports that her O2 setting was off by 6 points at our office when compared to forehead probe.  She would like to continue to monitor oxygen settings at home.  Oxygen levels have been increased to 9 L.  I emphasized to the patient as well as patient's son that I recommendation today is ER evaluation via EMS to maintain oxygen settings where they need to be as well as we are at our maximum therapy of how we can manage as outpatient.  Plan: We recommend ER eval, patient declined We will schedule close follow-up with our office   Follow Up  Instructions:  Return in about 5 days (around 11/30/2019), or if symptoms worsen or fail to improve, for Follow up with [name] FNP-C.   I discussed the assessment and treatment plan with the patient. The patient was provided an opportunity to ask questions and all were answered. The patient agreed with the plan and demonstrated an understanding of the instructions.   The patient was advised to call back or seek an in-person evaluation if the symptoms worsen or if the condition fails to improve as anticipated.  I provided 48 minutes of non-face-to-face time during this encounter.   [name], NP

## 2019-11-25 ENCOUNTER — Ambulatory Visit (INDEPENDENT_AMBULATORY_CARE_PROVIDER_SITE_OTHER): Payer: BC Managed Care – PPO | Admitting: Pulmonary Disease

## 2019-11-25 ENCOUNTER — Encounter: Payer: Self-pay | Admitting: Pulmonary Disease

## 2019-11-25 ENCOUNTER — Other Ambulatory Visit: Payer: Self-pay

## 2019-11-25 DIAGNOSIS — J9611 Chronic respiratory failure with hypoxia: Secondary | ICD-10-CM

## 2019-11-25 DIAGNOSIS — U071 COVID-19: Secondary | ICD-10-CM

## 2019-11-25 DIAGNOSIS — J1289 Other viral pneumonia: Secondary | ICD-10-CM

## 2019-11-25 DIAGNOSIS — R0902 Hypoxemia: Secondary | ICD-10-CM

## 2019-11-25 DIAGNOSIS — J449 Chronic obstructive pulmonary disease, unspecified: Secondary | ICD-10-CM

## 2019-11-25 DIAGNOSIS — I2721 Secondary pulmonary arterial hypertension: Secondary | ICD-10-CM

## 2019-11-25 NOTE — Assessment & Plan Note (Signed)
Discussion:  I recommended ER eval via ems today. Pt and son declined.  They are concerned about the isolation.  Patient feels that her oxygen levels are maintained well at home.  Patient reports that her O2 setting was off by 6 points at our office when compared to forehead probe.  She would like to continue to monitor oxygen settings at home.  Oxygen levels have been increased to 9 L.  I emphasized to the patient as well as patient's son that I recommendation today is ER evaluation via EMS to maintain oxygen settings where they need to be as well as we are at our maximum therapy of how we can manage as outpatient.  Plan: We recommend ER eval, patient declined We will schedule close follow-up with our office

## 2019-11-25 NOTE — Assessment & Plan Note (Signed)
Recent imaging findings of bilateral pneumonia  Treated with levaquin   Discussion:  O2 sats 81 percent sitting on 7L. Worsening hypoxemia. Covid19+, bilateral infiltrates, completed levaquin   Plan:  Recommend ER eval via 911

## 2019-11-25 NOTE — Assessment & Plan Note (Signed)
Plan: Maintain oxygen levels above 88 percent  Follow up with DME company about oxygen needs

## 2019-11-25 NOTE — Assessment & Plan Note (Addendum)
Plan: Continue Stiolto at this time reschedule pulmonary function testing Start prednisone 10 mg daily for 2 weeks, then decrease to 5 mg daily for 2 weeks, then stop completely Close follow-up with our office next week with virtual office visit

## 2019-11-25 NOTE — Patient Instructions (Addendum)
You were seen today by [name], NP  for:   1. Pneumonia due to COVID-19 virus 2. Chronic obstructive pulmonary disease, unspecified COPD type ([place]) 3. Chronic respiratory failure with hypoxia (HCC) 4. Secondary pulmonary arterial hypertension ([place]) 5. Hypoxemia   We recommended today that you present to the emergency room for further evaluation of your worsened oxygen needs.  Maintain oxygen saturations above 88%.  Stiolto Respimat inhaler >>>2 puffs daily >>>Take this no matter what >>>This is not a rescue inhaler  Transition to prednisone 10 mg daily for the next 14 days, then transition to 5 mg daily for 14 days, then stop  We will have a virtual visit follow-up on 11/30/2019 to monitor your symptoms  As explained on the telephone visit today if symptoms worsen you need to present to the emergency room.  I recommendation today is that due to your worsening oxygen needs as well as you being at maximum therapy from an outpatient standpoint you need to present to the emergency room today, you declined this.  Follow Up:    Return in about 5 days (around 11/30/2019), or if symptoms worsen or fail to improve, for Follow up with [name] FNP-C.   Please do your part to reduce the spread of COVID-19:      Reduce your risk of any infection  and COVID19 by using the similar precautions used for avoiding the common cold or flu:  Marland Kitchen Wash your hands often with soap and warm water for at least 20 seconds.  If soap and water are not readily available, use an alcohol-based hand sanitizer with at least 60% alcohol.  . If coughing or sneezing, cover your mouth and nose by coughing or sneezing into the elbow areas of your shirt or coat, into a tissue or into your sleeve (not your hands). Langley Gauss A MASK when in public  . Avoid shaking hands with others and consider head nods or verbal greetings only. . Avoid touching your eyes, nose, or mouth with unwashed hands.  . Avoid close contact with  people who are sick. . Avoid places or events with large numbers of people in one location, like concerts or sporting events. . If you have some symptoms but not all symptoms, continue to monitor at home and seek medical attention if your symptoms worsen. . If you are having a medical emergency, call 911.   [place] / e-Visit: [email or web address]         [facility]: [facility]: [phone]                   [facility]: [phone]     It is flu season:   >>> Best ways to protect herself from the flu: Receive the yearly flu vaccine, practice good hand hygiene washing with soap and also using hand sanitizer when available, eat a nutritious meals, get adequate rest, hydrate appropriately   Please contact the office if your symptoms worsen or you have concerns that you are not improving.   Thank you for choosing [facility] for your healthcare, and for allowing [place] to partner with you on your healthcare journey. I am thankful to be able to provide care to you today.   [name] FNP-C

## 2019-11-25 NOTE — Assessment & Plan Note (Addendum)
Plan: Maintain oxygen levels above 88 percent  Increased O2 to 9L  Needs follow up with [employer] next week   I had an extended discussion with the patient as well as the patient's son regarding her positive diagnosis as well as difficult abilities to maintain oxygen saturations above 88%.  We are aware that her pulse oximeter was running about 6 points lower than our forehead probe in our office last week.  Still though with patient's oxygen levels barely being maintained above 88% on 9 L this is high risk as patient is likely towards the 10-day mark of Covid which is where she is at her highest likelihood of decompensating having worsened hypoxemia.  I recommended the patient be evaluated in the emergency room via EMS to maintain oxygen saturations appropriately as well as to ensure the patient has a care that she needs.  Patient declined.  Patient son also declined.  They have concerns regarding the isolation as well as her mental health for this.  We reviewed multiple times things to monitor as well as that if patient worsens at all she will need to present to the emergency room.  They agreed.  We will also have close follow-up with our office as the patient has declined I recommendation for EMS/ER evaluation

## 2019-11-26 ENCOUNTER — Ambulatory Visit: Payer: BC Managed Care – PPO | Admitting: Pulmonary Disease

## 2019-11-29 NOTE — Progress Notes (Signed)
Virtual Visit via Video Note  I connected with Patricia Sampson on 11/30/19 at  3:30 PM EST by a video enabled telemedicine application and verified that I am speaking with the correct person using two identifiers.  Location: Patient: Home Provider: Office - [facility] - [address], [address], [place], [place] [address]  I discussed the limitations of evaluation and management by telemedicine and the availability of in person appointments. The patient expressed understanding and agreed to proceed. I also discussed with the patient that there may be a patient responsible charge related to this service. The patient expressed understanding and agreed to proceed.  Patient consented to consult via telephone: Yes People present and their role in pt care: Pt   History of Present Illness:  64 year old female former smoker followed in our office for COPD  Past medical history: Pulmonary arterial hypertension Smoking history: Former smoker Maintenance: Stiolto Respimat, 21m prednisone daily  Patient: Dr. BLamonte Sampson  Chief complaint: 4d follow up   64year old female former smoker followed in our office for COPD.  Patient also with pulmonary arterial hypertension.  Patient is status post Covid positive diagnosis on 11/21/2019.  This is definitely caused an increase in her oxygen needs.  She is completing a 4-day follow-up from our last telephone visit.  At last telephone visit patient's oxygen needs were increased to 9 L due to worsened hypoxemia.  Patient declined EMS/ED evaluation.  Patient reports that she is feeling better.  Oxygen saturations have been maintained above 88 to 90% on 7 L.  This is based off of her own pulse oximeter.  When compared at last office visit her pulse oximeter was typically around 4-5 points below our forehead probe in office.  She reports that she is feeling better, appetite is improved, lower extremity swelling has improved.  Patient son agrees that patient  looks like she is doing better.  Patient son and spouse have both tested negative for Covid.   Observations/Objective:  11/30/2019 - HR - 80-96 11/30/2019 - SP02 - 90 percent on 7L   11/21/2019 - SARS COV2 - covid+   04/26/2015-spirometry-FVC 1.8 (56% predicted), ratio 46, FEV1 0.8 (33% predicted)   Echocardiogram 01/06/2018-echocardiogram-LV ejection fraction 60 to 616% grade 1 diastolic dysfunction, PA pressure 38   Social History   Tobacco Use  Smoking Status Former Smoker  . Packs/day: 1.00  . Years: 30.00  . Pack years: 30.00  . Types: Cigarettes  . Quit date: 11/26/2000  . Years since quitting: 19.0  Smokeless Tobacco Never Used   Immunization History  Administered Date(s) Administered  . Influenza Split 08/26/2013, 08/26/2017  . Influenza,inj,Quad PF,6+ Mos 08/25/2015, 08/27/2016, 08/26/2019  . Pneumococcal Polysaccharide-23 12/27/2013    Assessment and Plan:  Pneumonia due to COVID-19 virus Plan: Continue to monitor patient clinically Follow-up in our office in 4 to 6 weeks for an office evaluation Continue to monitor oxygen saturations We will need to obtain pulmonary function testing  Secondary pulmonary arterial hypertension ([place] Plan: Continue to maintain oxygen levels above 88% Can further evaluate at office visit  Chronic respiratory failure with hypoxia ([place] Plan: Maintain oxygen saturations above 88 to 90% I have sent a message again to [employer] for further follow-up from their office  COPD (chronic obstructive pulmonary disease) ([place] Plan: Continue Stiolto Respimat Continue prednisone 10 mg daily, transition to 5 mg daily about 1.5 weeks, take 5 mg daily for 2 weeks then stop.  Follow-up in 4 to 6 weeks   Follow  Up Instructions:  Return in about 6 weeks (around 01/11/2020), or if symptoms worsen or fail to improve, for Follow up with Dr. Lamonte Sampson.    I discussed the assessment and treatment plan with the patient. The patient was provided an  opportunity to ask questions and all were answered. The patient agreed with the plan and demonstrated an understanding of the instructions.   The patient was advised to call back or seek an in-person evaluation if the symptoms worsen or if the condition fails to improve as anticipated.  I provided 28 minutes of non-face-to-face time during this encounter.   [name], NP

## 2019-11-30 ENCOUNTER — Other Ambulatory Visit: Payer: Self-pay

## 2019-11-30 ENCOUNTER — Encounter: Payer: Self-pay | Admitting: Pulmonary Disease

## 2019-11-30 ENCOUNTER — Ambulatory Visit (INDEPENDENT_AMBULATORY_CARE_PROVIDER_SITE_OTHER): Payer: BC Managed Care – PPO | Admitting: Pulmonary Disease

## 2019-11-30 DIAGNOSIS — J9611 Chronic respiratory failure with hypoxia: Secondary | ICD-10-CM

## 2019-11-30 DIAGNOSIS — I2721 Secondary pulmonary arterial hypertension: Secondary | ICD-10-CM | POA: Diagnosis not present

## 2019-11-30 DIAGNOSIS — J1282 Pneumonia due to coronavirus disease 2019: Secondary | ICD-10-CM

## 2019-11-30 DIAGNOSIS — J449 Chronic obstructive pulmonary disease, unspecified: Secondary | ICD-10-CM

## 2019-11-30 DIAGNOSIS — U071 COVID-19: Secondary | ICD-10-CM

## 2019-11-30 NOTE — Assessment & Plan Note (Signed)
Plan: Maintain oxygen saturations above 88 to 90% I have sent a message again to [employer] for further follow-up from their office

## 2019-11-30 NOTE — Assessment & Plan Note (Signed)
Plan: Continue to maintain oxygen levels above 88% Can further evaluate at office visit

## 2019-11-30 NOTE — Assessment & Plan Note (Addendum)
Plan: Continue Stiolto Respimat Continue prednisone 10 mg daily, transition to 5 mg daily about 1.5 weeks, take 5 mg daily for 2 weeks then stop.  Follow-up in 4 to 6 weeks

## 2019-11-30 NOTE — Patient Instructions (Addendum)
You were seen today by [name], NP  for:   1. Pneumonia due to COVID-19 virus  Continue to monitor your oxygen levels  Notify our office if symptoms worsen clinically  We will work to get you scheduled for an in office evaluation in 4 to 6 weeks  2. Chronic respiratory failure with hypoxia (HCC)  Continue oxygen therapy as prescribed  >>>maintain oxygen saturations greater than 88 percent  >>>if unable to maintain oxygen saturations please contact the office  >>>do not smoke with oxygen  >>>can use nasal saline gel or nasal saline rinses to moisturize nose if oxygen causes dryness  3. Chronic obstructive pulmonary disease, unspecified COPD type (HCC)  Stiolto Respimat inhaler >>>2 puffs daily >>>Take this no matter what >>>This is not a rescue inhaler  Only use your albuterol as a rescue medication to be used if you can't catch your breath by resting or doing a relaxed purse lip breathing pattern.  - The less you use it, the better it will work when you need it. - Ok to use up to 2 puffs  every 4 hours if you must but call for immediate appointment if use goes up over your usual need - Don't leave home without it !!  (think of it like the spare tire for your car)   Work to transition down off steroids as outlined at last office visit  4. Secondary pulmonary arterial hypertension (HCC)  Weight yourself daily Monitor lower extremity swelling Maintain oxygen saturations above 88%  Follow Up:    Return in about 6 weeks (around 01/11/2020), or if symptoms worsen or fail to improve, for Follow up with [name].   Please do your part to reduce the spread of COVID-19:      Reduce your risk of any infection  and COVID19 by using the similar precautions used for avoiding the common cold or flu:  Marland Kitchen Wash your hands often with soap and warm water for at least 20 seconds.  If soap and water are not readily available, use an alcohol-based hand sanitizer with at least 60%  alcohol.  . If coughing or sneezing, cover your mouth and nose by coughing or sneezing into the elbow areas of your shirt or coat, into a tissue or into your sleeve (not your hands). Langley Gauss A MASK when in public  . Avoid shaking hands with others and consider head nods or verbal greetings only. . Avoid touching your eyes, nose, or mouth with unwashed hands.  . Avoid close contact with people who are sick. . Avoid places or events with large numbers of people in one location, like concerts or sporting events. . If you have some symptoms but not all symptoms, continue to monitor at home and seek medical attention if your symptoms worsen. . If you are having a medical emergency, call 911.   [place] / e-Visit: [email or web address]         [facility]: [facility]: [phone]                   [facility]: [phone]     It is flu season:   >>> Best ways to protect herself from the flu: Receive the yearly flu vaccine, practice good hand hygiene washing with soap and also using hand sanitizer when available, eat a nutritious meals, get adequate rest, hydrate appropriately   Please contact the office if  your symptoms worsen or you have concerns that you are not improving.   Thank you for choosing [facility] for your healthcare, and for allowing [place] to partner with you on your healthcare journey. I am thankful to be able to provide care to you today.   [name] FNP-C

## 2019-11-30 NOTE — Assessment & Plan Note (Signed)
Plan: Continue to monitor patient clinically Follow-up in our office in 4 to 6 weeks for an office evaluation Continue to monitor oxygen saturations We will need to obtain pulmonary function testing

## 2019-12-07 ENCOUNTER — Ambulatory Visit: Payer: BC Managed Care – PPO | Admitting: Emergency Medicine

## 2019-12-07 DIAGNOSIS — J1282 Pneumonia due to coronavirus disease 2019: Secondary | ICD-10-CM

## 2019-12-07 DIAGNOSIS — J9611 Chronic respiratory failure with hypoxia: Secondary | ICD-10-CM

## 2019-12-07 DIAGNOSIS — J449 Chronic obstructive pulmonary disease, unspecified: Secondary | ICD-10-CM

## 2019-12-07 DIAGNOSIS — U071 COVID-19: Secondary | ICD-10-CM

## 2019-12-07 NOTE — Telephone Encounter (Signed)
I have placed the order under [facility] for pt's O2.

## 2019-12-07 NOTE — Telephone Encounter (Signed)
DME order placed on 12/23 to [facility] for pt. Pt sent mychart message to [place] stating that she has not heard anything from [employer] in regards to this. PCCs, is there any way you can look into this for [place] please?

## 2019-12-23 DIAGNOSIS — E876 Hypokalemia: Secondary | ICD-10-CM | POA: Diagnosis not present

## 2019-12-23 DIAGNOSIS — D649 Anemia, unspecified: Secondary | ICD-10-CM | POA: Diagnosis not present

## 2020-01-05 DIAGNOSIS — R600 Localized edema: Secondary | ICD-10-CM | POA: Diagnosis not present

## 2020-01-07 ENCOUNTER — Other Ambulatory Visit: Payer: Self-pay

## 2020-01-07 ENCOUNTER — Ambulatory Visit (INDEPENDENT_AMBULATORY_CARE_PROVIDER_SITE_OTHER): Payer: BC Managed Care – PPO | Admitting: Emergency Medicine

## 2020-01-07 ENCOUNTER — Encounter: Payer: Self-pay | Admitting: Emergency Medicine

## 2020-01-07 VITALS — BP 118/60 | HR 97 | Ht 69.0 in | Wt 237.0 lb

## 2020-01-07 DIAGNOSIS — I2721 Secondary pulmonary arterial hypertension: Secondary | ICD-10-CM

## 2020-01-07 DIAGNOSIS — J449 Chronic obstructive pulmonary disease, unspecified: Secondary | ICD-10-CM

## 2020-01-07 DIAGNOSIS — U071 COVID-19: Secondary | ICD-10-CM | POA: Diagnosis not present

## 2020-01-07 DIAGNOSIS — J9611 Chronic respiratory failure with hypoxia: Secondary | ICD-10-CM

## 2020-01-07 DIAGNOSIS — J1282 Pneumonia due to coronavirus disease 2019: Secondary | ICD-10-CM

## 2020-01-07 MED ORDER — BUDESONIDE-FORMOTEROL FUMARATE 160-4.5 MCG/ACT IN AERO: 2.0000 | INHALATION_SPRAY | Freq: Two times a day (BID) | RESPIRATORY_TRACT | 5 refills | Status: DC

## 2020-01-07 MED ORDER — ALBUTEROL SULFATE HFA 108 (90 BASE) MCG/ACT IN AERS: 2.0000 | INHALATION_SPRAY | RESPIRATORY_TRACT | 5 refills | Status: AC | PRN

## 2020-01-07 NOTE — Patient Instructions (Signed)
Continue your Spiriva once daily as you have been taking it. We will start Symbicort 2 puffs twice a day.  Rinse and gargle after use this medication. We will prescribe an albuterol inhaler.  Please use 2 puffs if you need it for shortness of breath, chest tightness, wheezing. Please continue your oxygen at 6 to 8 L/min as you have been using it. Get your pulmonary function testing as already planned. We will refer you for pulmonary rehab, preferably in [place]. Follow with [name] next available after your PFT to review the results and to talk about how your breathing is doing on the new medication.

## 2020-01-07 NOTE — Assessment & Plan Note (Signed)
Continue your Spiriva once daily as you have been taking it. We will start Symbicort 2 puffs twice a day.  Rinse and gargle after use this medication. We will prescribe an albuterol inhaler.  Please use 2 puffs if you need it for shortness of breath, chest tightness, wheezing. Get your pulmonary function testing as already planned. We will refer you for pulmonary rehab, preferably in [place]. Follow with [name] next available after your PFT to review the results and to talk about how your breathing is doing on the new medication.

## 2020-01-07 NOTE — Assessment & Plan Note (Signed)
Clinically improved but not back to her previous baseline.  Unclear whether she will achieve this.  I do think she would benefit from pulmonary rehab.  We will also try to optimize her COPD therapy as below

## 2020-01-07 NOTE — Progress Notes (Signed)
Subjective:    Patient ID: Patricia Sampson, female    DOB: 1956-11-24, 64 y.o.   MRN: [id number]  COPD   ROV 12/25/17 --this is a follow-up visit for patient with severe COPD with emphysema, chronic hypoxemic respiratory failure for which she has been using 3 L/min continuously.  At her last visit she had been more dyspneic so a child changing her Spiriva to [place] to see if she would benefit. She describes today that she remains dyspneic, her exertional tolerance remains poor. She denies any CP. She has some LE edema bilaterally. She has exertional desats. She is still working, has to walk a lot there. We tried to get a POC but she can't do pulsed flow O2.   ROV 01/07/20 --Patricia Sampson has severe COPD with associated chronic hypoxemic respiratory failure and secondary pulmonary hypertension.  She unfortunately was diagnosed with COVID-19 in December 2020 and experienced an increased oxygen need.  Her chest x-ray was consistent with pneumonia. She is on scheduled prednisone, ran out of it and wants to stay off of it. She is using 6-8L/min. She does still desat with exertion. She is on Spiriva alone. Doesn't have albuterol.    No flowsheet data found.      Objective:   Physical Exam Vitals:   01/07/20 1057  BP: 118/60  Pulse: 97  SpO2: 100%  Weight: 237 lb (107.5 kg)  Height: 5' 9" (1.753 m)   Gen: Pleasant, overwt, in no distress,  normal affect  ENT: No lesions,  mouth clear,  oropharynx clear, no postnasal drip  Neck: No JVD, no stridor  Lungs: No use of accessory muscles, distant, clear without rales or rhonchi  Cardiovascular: RRR, heart sounds normal, no murmur or gallops, no peripheral edema  Musculoskeletal: No deformities, no cyanosis or clubbing  Neuro: alert, non focal  Skin: Warm, no lesions or rashes     Assessment & Plan:  Pneumonia due to COVID-19 virus Clinically improved but not back to her previous baseline.  Unclear whether she will achieve this.  I do  think she would benefit from pulmonary rehab.  We will also try to optimize her COPD therapy as below  COPD (chronic obstructive pulmonary disease) ([place]) Continue your Spiriva once daily as you have been taking it. We will start Symbicort 2 puffs twice a day.  Rinse and gargle after use this medication. We will prescribe an albuterol inhaler.  Please use 2 puffs if you need it for shortness of breath, chest tightness, wheezing. Get your pulmonary function testing as already planned. We will refer you for pulmonary rehab, preferably in [place]. Follow with [name] next available after your PFT to review the results and to talk about how your breathing is doing on the new medication.  Chronic respiratory failure with hypoxia (HCC) Continue oxygen at 6 to 8 L/min.  We may need to uptitrate this depending on her persistent oxygen need.  We should be able to determine this based on her saturations at pulmonary rehab.  Baltazar Apo, MD, PhD 01/07/2020, 11:33 AM [facility] [phone] or if no answer [phone]

## 2020-01-07 NOTE — Assessment & Plan Note (Signed)
Continue oxygen at 6 to 8 L/min.  We may need to uptitrate this depending on her persistent oxygen need.  We should be able to determine this based on her saturations at pulmonary rehab.

## 2020-01-08 ENCOUNTER — Telehealth: Payer: Self-pay | Admitting: Pulmonary Disease

## 2020-01-08 NOTE — Telephone Encounter (Signed)
Sampson, Patricia Pierce, NP; Patricia Sampson, Patricia Sampson, [place]; Patricia Sampson  I have messaged Management to please call her today       Previous Messages   ----- Message -----  From: Patricia Rinne, NP  Sent: 11/30/2019  3:46 PM EST  To: Patricia Sampson, Patricia Sampson, *  Subject: RE: New 02 continuous               Pt was reached today from our office. Pt still needing follow up from [employer] [facility]. Can someone please contact the pt or pts son.   Can someone please contact them from [place].   Patricia Quaker FNP  ----- Message -----  From: Patricia Sampson  Sent: 11/25/2019 10:32 AM EST  To: Patricia Sampson, Patricia Sampson, *  Subject: RE: New 02 continuous               Thank you so much for letting us know. I have sent this information to management to call her next week to discuss options.   ----- Message -----  From: Patricia Rinne, NP  Sent: 11/25/2019  9:57 AM EST  To: Patricia Sampson, Patricia Sampson, *  Subject: RE: New 02 continuous               I spoke with patient today as well as patient's son. They reported that they did have some confusion regarding what [employer] was trying to do. Patient is status post Covid. Diagnosis on 11/21/2019. She would like to have somebody to call her over the phone next week to further discuss her options. She is aware that she needs to make a change in order for her to present to office visits in the future based off her high oxygen needs.  I did recommend that she present to the emergency room via EMS today due to worsened hypoxemia. We had her increase to 9 L O2 at home. When she was on 10 L O2 the concentrator started beeping and said it was going to shut off. She is on maximum therapy. She declined presenting to an emergency room or calling EMS.  Wanted to update you.  [name]  ----- Message -----  From: Patricia Sampson  Sent: 11/23/2019  9:54 AM EST  To:  Patricia Sampson, Patricia Sampson, *  Subject: RE: New 02 continuous               We have attempted to switchout the homefill with the standard tank setup with tank cart and cylinder tanks and she has refused that. She has stated she has what she needs.   ----- Message -----  From: Patricia Sampson  Sent: 11/23/2019  9:01 AM EST  To: Patricia Sampson, Patricia Sampson, *  Subject: RE: New 02 continuous               Order was placed to do re-education. That was done on the 24th. I have been advised by our operations manager that we will pick up the homefill system and deliver the standard tank setup with the tank cart. I have placed that order and asked that it be done today. Thank you   ----- Message -----  From: Patricia Rinne, NP  Sent: 11/18/2019  9:20 PM EST  To: Patricia Sampson, Patricia Sampson, *  Subject: RE: New 02 continuous  Thank you for the update.   Order was signed today. Should be up to date on our end so the patient can receive 6L cont and have the option of increasing to 8-10L with exertion.   Please notify me via this message when the patient has received all of her supplies, has been sufficiently education on her use of those supplies.   Until I receive a response I will assume this task has not been completed fully.   Happy Holidays,   Patricia Quaker FNP  [facility]  ----- Message -----  From: [name]  Sent: 11/18/2019  4:17 PM EST  To: Patricia Rinne, NP, [name], CMA  Subject: Melton Alar: New 02 continuous               2nd response from Patricia Sampson with [employer]  See below   Patricia Sampson  ----- Message -----  From: Patricia Sampson  Sent: 11/18/2019  3:00 PM EST  To: Patricia Sampson, [name], *  Subject: RE: New 02 continuous               Our branch manager has advised we need to pickup her homefill and place her on a standard tank and cart setup. He advised we can do  that tomorrow. He advised -I have called and spoke with the patient. She is now operating it at 6lpm and is aware to turn it up as needed. She is ok with tomorrow because it would be too late tonight. The patient is satisfied we are meeting her needs.   If she needs to be on 8-10 lpm please revise order and if it is done today let me know, if not you will need to fax it in because I will be out of the office after 5:30 today.   ----- Message -----  From: Patricia Sampson  Sent: 11/18/2019  2:18 PM EST  To: Patricia Sampson, [name], *  Subject: RE: New 02 continuous               Patient has a homefill which has portable tanks D tanks which are NOT pulse dose. In speaking with the patient she seems to be confused on how to use the tanks. We have placed a re-education ticket and one of our processors is working on this now. I have noted that her liter flow now needs to be 6lpm cont per the latest order just uploaded. I have asked the delivery department to have the driver verify what equipment she has and to make sure she has D tanks for the homefill. The order only states 6lpm cont if she is needing to be on 8-10 lpm on exertion we will need a revised order to state 6lpm cont and 8-10 lpm on exertion.  Thank you   ----- Message -----  From: [name]  Sent: 11/18/2019  1:51 PM EST  To: Patricia Sampson  Subject: New 02 continuous                 Patricia Sampson please see Patricia Sampson's note below. Needs to be taken care of today.     11/18/2019 1302    Patient was seen in office today. She requires 6 L cont at baseline. NOT PULSED. She was discharged with one of our tanks. She is reporting today that she has a oxygen concentrator that has the capacity of 10 L. She needs to be followed up with by [facility] likely sometime  within the next 24 hours to ensure she has all of the oxygen supplies that she needs. She should be maintained at 6 L at baseline as well as 8 to 10  L with physical exertion.    [employer] needs to notify our office after they have completed the follow-up with the patient.    PCCs can we ensure this happens?    Patricia Quaker, FNP    ----- Message -----  From: [name]  Sent: 11/18/2019  1:08 PM EST  To: Patricia Sampson, Patricia Sampson, [name], *  Subject: New02? continuous                 This order has been placed by Patricia Sampson and has been signed Patient walked in office today   DOB [birth year]   Thanks  Patricia Sampson

## 2020-01-19 ENCOUNTER — Telehealth: Payer: Self-pay | Admitting: Emergency Medicine

## 2020-01-19 NOTE — Telephone Encounter (Signed)
[name]  The message from the patient was sent to you as an FYI. Patient last appointment with you was on 01/07/20.

## 2020-02-04 DIAGNOSIS — J449 Chronic obstructive pulmonary disease, unspecified: Secondary | ICD-10-CM | POA: Diagnosis not present

## 2020-02-04 DIAGNOSIS — I10 Essential (primary) hypertension: Secondary | ICD-10-CM | POA: Diagnosis not present

## 2020-02-04 DIAGNOSIS — E7800 Pure hypercholesterolemia, unspecified: Secondary | ICD-10-CM | POA: Diagnosis not present

## 2020-02-04 DIAGNOSIS — R600 Localized edema: Secondary | ICD-10-CM | POA: Diagnosis not present

## 2020-02-23 ENCOUNTER — Other Ambulatory Visit (HOSPITAL_COMMUNITY): Payer: BC Managed Care – PPO

## 2020-02-26 ENCOUNTER — Ambulatory Visit: Payer: BC Managed Care – PPO | Admitting: Pulmonary Disease

## 2020-03-15 ENCOUNTER — Other Ambulatory Visit (HOSPITAL_COMMUNITY)
Admission: RE | Admit: 2020-03-15 | Discharge: 2020-03-15 | Disposition: A | Discharge status: Home / Self Care | Payer: BC Managed Care – PPO | Source: Ambulatory Visit | Attending: Emergency Medicine | Admitting: Emergency Medicine

## 2020-03-15 DIAGNOSIS — Z20822 Contact with and (suspected) exposure to covid-19: Secondary | ICD-10-CM | POA: Insufficient documentation

## 2020-03-15 DIAGNOSIS — Z01812 Encounter for preprocedural laboratory examination: Secondary | ICD-10-CM | POA: Insufficient documentation

## 2020-03-15 LAB — SARS CORONAVIRUS 2 (TAT 6-24 HRS): SARS Coronavirus 2: NEGATIVE

## 2020-03-17 NOTE — Progress Notes (Signed)
_0  ID: Patricia Sampson, female    DOB: 03/01/1956, 64 y.o.   MRN: [id number]  Chief Complaint  Patient presents with  . Follow-up    f/u on PFT.  Breathing is a little better.  SOB with exertion.    Referring provider: Greig Right, MD  HPI:  63 year old female former smoker followed in our office for COPD  Past medical history: Pulmonary arterial hypertension Smoking history: Former smoker Maintenance: Spiriva Respimat 2.5, Symbicort 160 Patient: Patricia Sampson   03/18/2020  - Visit   63 year old female former smoker followed in our office for COPD and pulmonary arterial hypertension.  She was last seen in our office in February/2021 by Patricia Sampson.  At that time she was felt to be clinically improved  but not back to her previous baseline.  Status post COVID-19 infection.  She is encouraged to remain on Spiriva and start Symbicort 160.  She was encouraged to complete pulmonary rehab as well as to complete pulmonary function testing and to remain on oxygen.  Patient completed pulmonary function testing today those results are listed below:  03/18/2020-pulmonary function test-FVC 1.27 (41% predicted), postbronchodilator ratio 50, postbronchodilator FEV1 0.65 (27% predicted), no bronchodilator response, DLCO 5.31 (23% predicted)  Patient presenting today here with her son.  They would like to review the pulmonary function test.  Patient feels that she is back at her baseline.  She reports adherence to her inhalers.  She admits that it was a struggle doing the breathing test.  Questionaires / Pulmonary Flowsheets:   MMRC: mMRC Dyspnea Scale mMRC Score  03/18/2020 4  11/18/2019 4   Tests:    11/21/2019 - SARS COV2 - covid+   04/26/2015-spirometry-FVC 1.8 (56% predicted), ratio 46, FEV1 0.8 (33% predicted)   03/18/2020-pulmonary function test-FVC 1.27 (41% predicted), postbronchodilator ratio 50, postbronchodilator FEV1 0.65 (27% predicted), no bronchodilator response, DLCO 5.31  (23% predicted)   Echocardiogram 01/06/2018-echocardiogram-LV ejection fraction 60 to 10%, grade 1 diastolic dysfunction, PA pressure 38   FENO:  No results found for: NITRICOXIDE  PFT: PFT Results Latest Ref Rng & Units 03/18/2020  FVC-Pre L 1.27  FVC-Predicted Pre % 41  FVC-Post L 1.30  FVC-Predicted Post % 42  Pre FEV1/FVC % % 46  Post FEV1/FCV % % 50  FEV1-Pre L 0.59  FEV1-Predicted Pre % 24  FEV1-Post L 0.65  DLCO UNC% % 23  DLCO COR %Predicted % 48    WALK:  SIX MIN WALK 11/18/2019 01/27/2014  Supplimental Oxygen during Test? (L/min) Yes No  O2 Flow Rate 4 -  Type Continuous -    Imaging: No results found.  Lab Results:  CBC    Component Value Date/Time   WBC 4.9 11/18/2019 1240   RBC 3.98 11/18/2019 1240   HGB 10.5 Repeated and verified X2. (L) 11/18/2019 1240   HCT 33.3 (L) 11/18/2019 1240   PLT 233.0 11/18/2019 1240   MCV 83.7 11/18/2019 1240   MCHC 31.5 11/18/2019 1240   RDW 15.0 11/18/2019 1240   LYMPHSABS 0.7 11/18/2019 1240   MONOABS 0.5 11/18/2019 1240   EOSABS 0.0 11/18/2019 1240   BASOSABS 0.0 11/18/2019 1240    BMET    Component Value Date/Time   NA 128 (L) 11/18/2019 1240   K 3.0 (L) 11/18/2019 1240   CL 76 (L) 11/18/2019 1240   CO2 44 (H) 11/18/2019 1240   GLUCOSE 109 (H) 11/18/2019 1240   BUN 19 11/18/2019 1240   CREATININE 0.87 11/18/2019 1240   CALCIUM 9.2 11/18/2019  1240    BNP No results found for: BNP  ProBNP    Component Value Date/Time   PROBNP 28.0 11/18/2019 1240    Specialty Problems      Pulmonary Problems   COPD (chronic obstructive pulmonary disease) (HCC)    04/26/2015-spirometry-FVC 1.8 (56% predicted), ratio 46, FEV1 0.8 (33% predicted)  03/18/2020-pulmonary function test-FVC 1.27 (41% predicted), postbronchodilator ratio 50, postbronchodilator FEV1 0.65 (27% predicted), no bronchodilator response, DLCO 5.31 (23% predicted)       Chronic respiratory failure with hypoxia (HCC)   Shortness of breath    Pneumonia due to COVID-19 virus    11/18/2019 - cxry - bilateral pneumonia  11/21/2019 - SARS COV2 - covid+          Allergies  Allergen Reactions  . Dyazide [Hydrochlorothiazide W-Triamterene]     hives  . Zestoretic [Lisinopril-Hydrochlorothiazide]     Swelling     Immunization History  Administered Date(s) Administered  . Influenza Split 08/26/2013, 08/26/2017  . Influenza,inj,Quad PF,6+ Mos 08/25/2015, 08/27/2016, 08/26/2019  . PFIZER SARS-COV-2 Vaccination 02/16/2020, 03/07/2020  . Pneumococcal Polysaccharide-23 12/27/2013    Past Medical History:  Diagnosis Date  . Angioedema   . COPD (chronic obstructive pulmonary disease) ([place])   . FHx: migraine headaches   . Hyperlipidemia   . Hypertension   . Seasonal allergies     Tobacco History: Social History   Tobacco Use  Smoking Status Former Smoker  . Packs/day: 1.00  . Years: 30.00  . Pack years: 30.00  . Types: Cigarettes  . Quit date: 11/26/2000  . Years since quitting: 19.3  Smokeless Tobacco Never Used   Counseling given: Yes   Continue to not smoke  Outpatient Encounter Medications as of 03/18/2020  Medication Sig  . acetaminophen (TYLENOL) 500 MG tablet Take 500 mg by mouth every 6 (six) hours as needed.  Marland Kitchen amitriptyline (ELAVIL) 10 MG tablet Take 10 mg by mouth daily.  Marland Kitchen amLODipine (NORVASC) 2.5 MG tablet Take 2.5 mg by mouth daily.  . budesonide-formoterol (SYMBICORT) 160-4.5 MCG/ACT inhaler Inhale 2 puffs into the lungs 2 (two) times daily.  . cholecalciferol (VITAMIN D3) 25 MCG (1000 UNIT) tablet Take 1,000 Units by mouth daily.  . Magnesium 250 MG TABS Take 250 mg by mouth daily.  . potassium chloride (MICRO-K) 10 MEQ CR capsule Take 10 mEq by mouth daily.  . rosuvastatin (CRESTOR) 10 MG tablet Take 10 mg by mouth daily.  Marland Kitchen albuterol (VENTOLIN HFA) 108 (90 Base) MCG/ACT inhaler Inhale 2 puffs into the lungs every 4 (four) hours as needed for wheezing or shortness of breath. (Patient not taking:  Reported on 03/18/2020)  . chlorthalidone (HYGROTON) 25 MG tablet Take 50 mg by mouth daily.   . [DISCONTINUED] Tiotropium Bromide-Olodaterol (STIOLTO RESPIMAT) 2.5-2.5 MCG/ACT AERS Inhale 2 puffs into the lungs daily. (Patient not taking: Reported on 03/18/2020)   No facility-administered encounter medications on file as of 03/18/2020.     Review of Systems  Review of Systems  Constitutional: Positive for fatigue. Negative for activity change and fever.  HENT: Negative for sinus pressure, sinus pain and sore throat.   Respiratory: Positive for shortness of breath. Negative for cough and wheezing.   Cardiovascular: Negative for chest pain and palpitations.  Gastrointestinal: Negative for diarrhea, nausea and vomiting.  Musculoskeletal: Negative for arthralgias.  Neurological: Negative for dizziness.  Psychiatric/Behavioral: Negative for sleep disturbance. The patient is not nervous/anxious.      Physical Exam  BP 100/70 (BP Location: Left Arm, Cuff Size: Normal)  Pulse (!) 115   Temp 97.8 F (36.6 C) (Temporal)   Ht _0  (1.753 m)   Wt 233 lb (105.7 kg)   SpO2 97%   BMI 34.41 kg/m   Wt Readings from Last 5 Encounters:  03/18/20 233 lb (105.7 kg)  01/07/20 237 lb (107.5 kg)  11/18/19 218 lb (98.9 kg)  10/28/17 207 lb 6.4 oz (94.1 kg)  04/30/17 197 lb 3.2 oz (89.4 kg)    BMI Readings from Last 5 Encounters:  03/18/20 34.41 kg/m  01/07/20 35.00 kg/m  11/18/19 32.19 kg/m  10/28/17 30.63 kg/m  04/30/17 28.70 kg/m     Physical Exam Vitals and nursing note reviewed.  Constitutional:      General: She is not in acute distress.    Appearance: Normal appearance. She is obese.  HENT:     Head: Normocephalic and atraumatic.     Right Ear: External ear normal. There is no impacted cerumen.     Left Ear: External ear normal. There is no impacted cerumen.     Nose: Nose normal. No congestion or rhinorrhea.     Mouth/Throat:     Mouth: Mucous membranes are moist.      Pharynx: Oropharynx is clear.  Eyes:     Pupils: Pupils are equal, round, and reactive to light.  Cardiovascular:     Rate and Rhythm: Regular rhythm. Tachycardia present.     Pulses: Normal pulses.     Heart sounds: Normal heart sounds. No murmur.  Pulmonary:     Breath sounds: No decreased air movement. No decreased breath sounds, wheezing or rales.     Comments: Diminished breath sounds through out  Musculoskeletal:     Cervical back: Normal range of motion.  Skin:    General: Skin is warm and dry.     Capillary Refill: Capillary refill takes less than 2 seconds.  Neurological:     General: No focal deficit present.     Mental Status: She is alert and oriented to person, place, and time. Mental status is at baseline.     Gait: Gait normal.  Psychiatric:        Mood and Affect: Mood normal.        Behavior: Behavior normal.        Thought Content: Thought content normal.        Judgment: Judgment normal.       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HCC) Plan: Continue oxygen therapy Patient requiring 6 to 8 L Maintain oxygen saturations above 88% Discussed palliative care versus hospice, patient will think about referral options  Chronic respiratory failure with hypoxia (HCC) Plan: Continue oxygen therapy between 6-8 L Maintain oxygen saturations above 88%   COPD (chronic obstructive pulmonary disease) (HCC) Reviewed pulmonary function test with patient Reviewed previous spirometry Patient reports adherence to Symbicort 160 and Spiriva Respimat 2.5 Son who is present with patient today is wondering if there is any other options to help further manage patient's breathing  Plan: Continue Symbicort 160 Continue Spiriva Respimat 2.5 We will send message to clinical pharmacy team to investigate cost of maintenance nebulized meds Continue to increase activity Maintain oxygen saturations above 88%  Discussed the severity of the patient's  breathing on current pulmonary function test as well as the severe diffusion defect.  Discussed that at this stage it is reasonable to consider referral to palliative care and even potentially hospice to help further coordinate patient's care as well as make sure  she has adequate resources at home.  Patient is open to considering a palliative care referral.  She will think about this.  Patient knows that this is an open ended offered and she can notify our office at any point in time.    Return in about 4 weeks (around 04/15/2020), or if symptoms worsen or fail to improve, for Follow up with Patricia Sampson, Follow up with [name] FNP-C.   [name], NP 03/18/2020   This appointment required 45 minutes of patient care (this includes precharting, chart review, review of results, face-to-face care, etc.).

## 2020-03-18 ENCOUNTER — Ambulatory Visit (INDEPENDENT_AMBULATORY_CARE_PROVIDER_SITE_OTHER): Payer: BC Managed Care – PPO | Admitting: Emergency Medicine

## 2020-03-18 ENCOUNTER — Encounter: Payer: Self-pay | Admitting: Pulmonary Disease

## 2020-03-18 ENCOUNTER — Other Ambulatory Visit: Payer: Self-pay

## 2020-03-18 ENCOUNTER — Ambulatory Visit (INDEPENDENT_AMBULATORY_CARE_PROVIDER_SITE_OTHER): Payer: BC Managed Care – PPO | Admitting: Pulmonary Disease

## 2020-03-18 ENCOUNTER — Telehealth: Payer: Self-pay | Admitting: Pulmonary Disease

## 2020-03-18 VITALS — BP 100/70 | HR 115 | Temp 97.8°F | Ht 69.0 in | Wt 233.0 lb

## 2020-03-18 DIAGNOSIS — I2721 Secondary pulmonary arterial hypertension: Secondary | ICD-10-CM

## 2020-03-18 DIAGNOSIS — J449 Chronic obstructive pulmonary disease, unspecified: Secondary | ICD-10-CM

## 2020-03-18 DIAGNOSIS — J9611 Chronic respiratory failure with hypoxia: Secondary | ICD-10-CM

## 2020-03-18 LAB — PULMONARY FUNCTION TEST
DL/VA % pred: 48 %
DL/VA: 1.97 ml/min/mmHg/L
DLCO cor % pred: 23 %
DLCO cor: 5.31 ml/min/mmHg
DLCO unc % pred: 23 %
DLCO unc: 5.31 ml/min/mmHg
FEF 25-75 Post: 0.23 L/sec
FEF 25-75 Pre: 0.2 L/sec
FEF2575-%Change-Post: 16 %
FEF2575-%Pred-Post: 10 %
FEF2575-%Pred-Pre: 8 %
FEV1-%Change-Post: 11 %
FEV1-%Pred-Post: 27 %
FEV1-%Pred-Pre: 24 %
FEV1-Post: 0.65 L
FEV1-Pre: 0.59 L
FEV1FVC-%Change-Post: 8 %
FEV1FVC-%Pred-Pre: 58 %
FEV6-%Change-Post: 5 %
FEV6-%Pred-Post: 42 %
FEV6-%Pred-Pre: 40 %
FEV6-Post: 1.26 L
FEV6-Pre: 1.19 L
FEV6FVC-%Change-Post: 3 %
FEV6FVC-%Pred-Post: 100 %
FEV6FVC-%Pred-Pre: 97 %
FVC-%Change-Post: 2 %
FVC-%Pred-Post: 42 %
FVC-%Pred-Pre: 41 %
FVC-Post: 1.3 L
FVC-Pre: 1.27 L
Post FEV1/FVC ratio: 50 %
Post FEV6/FVC ratio: 97 %
Pre FEV1/FVC ratio: 46 %
Pre FEV6/FVC Ratio: 94 %

## 2020-03-18 NOTE — Patient Instructions (Addendum)
You were seen today by [name], NP  for:   1. Secondary pulmonary arterial hypertension ([place]) 2. Chronic respiratory failure with hypoxia (HCC)  Continue oxygen therapy as prescribed  >>>maintain oxygen saturations greater than 88 percent  >>>if unable to maintain oxygen saturations please contact the office  >>>do not smoke with oxygen  >>>can use nasal saline gel or nasal saline rinses to moisturize nose if oxygen causes dryness  3. Chronic obstructive pulmonary disease, unspecified COPD type (HCC)  Continue Symbicort >>> 2 puffs in the morning right when you wake up, rinse out your mouth after use, 12 hours later 2 puffs, rinse after use >>> Take this daily, no matter what >>> This is not a rescue inhaler   Spiriva Respimat 2.5 >>> 2 puffs daily >>> Do this every day >>>This is not a rescue inhaler  Only use your albuterol as a rescue medication to be used if you can't catch your breath by resting or doing a relaxed purse lip breathing pattern.  - The less you use it, the better it will work when you need it. - Ok to use up to 2 puffs  every 4 hours if you must but call for immediate appointment if use goes up over your usual need - Don't leave home without it !!  (think of it like the spare tire for your car)   Note your daily symptoms > remember "red flags" for COPD:   >>>Increase in cough >>>increase in sputum production >>>increase in shortness of breath or activity  intolerance.   I will ask our pharmacy team to evaluate the cost of nebulized meds as discussed today to see if those could replace your Symbicort and Spiriva.  If you notice these symptoms, please call the office to be seen.   Offered physical therapy today, you declined  As we discussed today if you become interested in palliative care or hospice or would like to discuss this further please let us know  Follow Up:    Return in about 4 weeks (around 04/15/2020), or if symptoms worsen or fail to  improve, for Follow up with [name], Follow up with [name] FNP-C.   Please do your part to reduce the spread of COVID-19:      Reduce your risk of any infection  and COVID19 by using the similar precautions used for avoiding the common cold or flu:  Marland Kitchen Wash your hands often with soap and warm water for at least 20 seconds.  If soap and water are not readily available, use an alcohol-based hand sanitizer with at least 60% alcohol.  . If coughing or sneezing, cover your mouth and nose by coughing or sneezing into the elbow areas of your shirt or coat, into a tissue or into your sleeve (not your hands). Langley Gauss A MASK when in public  . Avoid shaking hands with others and consider head nods or verbal greetings only. . Avoid touching your eyes, nose, or mouth with unwashed hands.  . Avoid close contact with people who are sick. . Avoid places or events with large numbers of people in one location, like concerts or sporting events. . If you have some symptoms but not all symptoms, continue to monitor at home and seek medical attention if your symptoms worsen. . If you are having a medical emergency, call 911.   [employer] / e-Visit: [email or web address]         [facility]: [facility]: [phone]                   [facility]: [phone]     It is flu season:   >>> Best ways to protect herself from the flu: Receive the yearly flu vaccine, practice good hand hygiene washing with soap and also using hand sanitizer when available, eat a nutritious meals, get adequate rest, hydrate appropriately   Please contact the office if your symptoms worsen or you have concerns that you are not improving.   Thank you for choosing [facility] for your healthcare, and for allowing [place] to partner with you on your healthcare journey. I  am thankful to be able to provide care to you today.   [name] FNP-C    COPD and Physical Activity Chronic obstructive pulmonary disease (COPD) is a long-term (chronic) condition that affects the lungs. COPD is a general term that can be used to describe many different lung problems that cause lung swelling (inflammation) and limit airflow, including chronic bronchitis and emphysema. The main symptom of COPD is shortness of breath, which makes it harder to do even simple tasks. This can also make it harder to exercise and be active. Talk with your health care provider about treatments to help you breathe better and actions you can take to prevent breathing problems during physical activity. What are the benefits of exercising with COPD? Exercising regularly is an important part of a healthy lifestyle. You can still exercise and do physical activities even though you have COPD. Exercise and physical activity improve your shortness of breath by increasing blood flow (circulation). This causes your heart to pump more oxygen through your body. Moderate exercise can improve your:  Oxygen use.  Energy level.  Shortness of breath.  Strength in your breathing muscles.  Heart health.  Sleep.  Self-esteem and feelings of self-worth.  Depression, stress, and anxiety levels. Exercise can benefit everyone with COPD. The severity of your disease may affect how hard you can exercise, especially at first, but everyone can benefit. Talk with your health care provider about how much exercise is safe for you, and which activities and exercises are safe for you. What actions can I take to prevent breathing problems during physical activity?  Sign up for a pulmonary rehabilitation program. This type of program may include: ? Education about lung diseases. ? Exercise classes that teach you how to exercise and be more active while improving your breathing. This usually involves:  Exercise using your  lower extremities, such as a stationary bicycle.  About 30 minutes of exercise, 2 to 5 times per week, for 6 to 12 weeks  Strength training, such as push ups or leg lifts. ? Nutrition education. ? Group classes in which you can talk with others who also have COPD and learn ways to manage stress.  If you use an oxygen tank, you should use it while you exercise. Work with your health care provider to adjust your oxygen for your physical activity. Your resting flow rate is different from your flow rate during physical activity.  While you are exercising: ? Take slow breaths. ? Pace yourself and do not try to go too fast. ? Purse your lips while breathing out. Pursing your lips is similar to a kissing or whistling position. ? If doing exercise that uses a quick burst of effort, such as weight lifting:  Breathe in before starting the exercise.  Breathe out during the hardest part of the exercise (such as raising the weights). Where to find support You can find support for exercising with COPD from:  Your health care provider.  A pulmonary rehabilitation program.  Your local health department or community health programs.  Support groups, online or in-person. Your health care provider may be able to recommend support groups. Where to find more information You can find more information about exercising with COPD from:  American Lung Association: ClassInsider.se.  COPD Foundation: https://www.rivera.net/. Contact a health care provider if:  Your symptoms get worse.  You have chest pain.  You have nausea.  You have a fever.  You have trouble talking or catching your breath.  You want to start a new exercise program or a new activity. Summary  COPD is a general term that can be used to describe many different lung problems that cause lung swelling (inflammation) and limit airflow. This includes chronic bronchitis and emphysema.  Exercise and physical activity improve your shortness of  breath by increasing blood flow (circulation). This causes your heart to provide more oxygen to your body.  Contact your health care provider before starting any exercise program or new activity. Ask your health care provider what exercises and activities are safe for you. This information is not intended to replace advice given to you by your health care provider. Make sure you discuss any questions you have with your health care provider. Document Revised: 03/04/2019 Document Reviewed: 12/05/2017 Elsevier Patient Education  [place].   Exercises To Do While Sitting  Exercises that you do while sitting (chair exercises) can give you many of the same benefits as full exercise. Benefits include strengthening your heart, burning calories, and keeping muscles and joints healthy. Exercise can also improve your mood and help with depression and anxiety. You may benefit from chair exercises if you are unable to do standing exercises because of:  Diabetic foot pain.  Obesity.  Illness.  Arthritis.  Recovery from surgery or injury.  Breathing problems.  Balance problems.  Another type of disability. Before starting chair exercises, check with your health care provider or a physical therapist to find out how much exercise you can tolerate and which exercises are safe for you. If your health care provider approves:  Start out slowly and build up over time. Aim to work up to about 10-20 minutes for each exercise session.  Make exercise part of your daily routine.  Drink water when you exercise. Do not wait until you are thirsty. Drink every 10-15 minutes.  Stop exercising right away if you have pain, nausea, shortness of breath, or dizziness.  If you are exercising in a wheelchair, make sure to lock the wheels.  Ask your health care provider whether you can do tai chi or yoga. Many positions in these mind-body exercises can be modified to do while seated. Warm-up Before  starting other exercises: 1. Sit up as straight as you can. Have your knees bent at 90 degrees, which is the shape of the capital letter "L." Keep your feet flat on the floor. 2. Sit at the front edge of your chair, if you can. 3. Pull in (tighten) the muscles in your abdomen and stretch your spine and neck as straight as you can. Hold this position for a few minutes. 4. Breathe in and out evenly. Try to concentrate on your breathing, and relax your mind. Stretching Exercise A: Arm stretch 1. Hold your arms out straight in front of  your body. 2. Bend your hands at the wrist with your fingers pointing up, as if signaling someone to stop. Notice the slight tension in your forearms as you hold the position. 3. Keeping your arms out and your hands bent, rotate your hands outward as far as you can and hold this stretch. Aim to have your thumbs pointing up and your pinkie fingers pointing down. Slowly repeat arm stretches for one minute as tolerated. Exercise B: Leg stretch 1. If you can move your legs, try to "draw" letters on the floor with the toes of your foot. Write your name with one foot. 2. Write your name with the toes of your other foot. Slowly repeat the movements for one minute as tolerated. Exercise C: Reach for the sky 1. Reach your hands as far over your head as you can to stretch your spine. 2. Move your hands and arms as if you are climbing a rope. Slowly repeat the movements for one minute as tolerated. Range of motion exercises Exercise A: Shoulder roll 1. Let your arms hang loosely at your sides. 2. Lift just your shoulders up toward your ears, then let them relax back down. 3. When your shoulders feel loose, rotate your shoulders in backward and forward circles. Do shoulder rolls slowly for one minute as tolerated. Exercise B: March in place 1. As if you are marching, pump your arms and lift your legs up and down. Lift your knees as high as you can. ? If you are unable to  lift your knees, just pump your arms and move your ankles and feet up and down. March in place for one minute as tolerated. Exercise C: Seated jumping jacks 1. Let your arms hang down straight. 2. Keeping your arms straight, lift them up over your head. Aim to point your fingers to the ceiling. 3. While you lift your arms, straighten your legs and slide your heels along the floor to your sides, as wide as you can. 4. As you bring your arms back down to your sides, slide your legs back together. ? If you are unable to use your legs, just move your arms. Slowly repeat seated jumping jacks for one minute as tolerated. Strengthening exercises Exercise A: Shoulder squeeze 1. Hold your arms straight out from your body to your sides, with your elbows bent and your fists pointed at the ceiling. 2. Keeping your arms in the bent position, move them forward so your elbows and forearms meet in front of your face. 3. Open your arms back out as wide as you can with your elbows still bent, until you feel your shoulder blades squeezing together. Hold for 5 seconds. Slowly repeat the movements forward and backward for one minute as tolerated. Contact a health care provider if you:  Had to stop exercising due to any of the following: ? Pain. ? Nausea. ? Shortness of breath. ? Dizziness. ? Fatigue.  Have significant pain or soreness after exercising. Get help right away if you have:  Chest pain.  Difficulty breathing. These symptoms may represent a serious problem that is an emergency. Do not wait to see if the symptoms will go away. Get medical help right away. Call your local emergency services (911 in the [place] Do not drive yourself to the hospital. This information is not intended to replace advice given to you by your health care provider. Make sure you discuss any questions you have with your health care provider. Document Revised: 03/05/2019 Document Reviewed: 09/25/2017 Elsevier  Patient  Education  [employer].

## 2020-03-18 NOTE — Assessment & Plan Note (Signed)
Plan: Continue oxygen therapy Patient requiring 6 to 8 L Maintain oxygen saturations above 88% Discussed palliative care versus hospice, patient will think about referral options

## 2020-03-18 NOTE — Progress Notes (Signed)
PFT done today.

## 2020-03-18 NOTE — Assessment & Plan Note (Signed)
Plan: Continue oxygen therapy between 6-8 L Maintain oxygen saturations above 88%

## 2020-03-18 NOTE — Assessment & Plan Note (Signed)
Reviewed pulmonary function test with patient Reviewed previous spirometry Patient reports adherence to Symbicort 160 and Spiriva Respimat 2.5 Son who is present with patient today is wondering if there is any other options to help further manage patient's breathing  Plan: Continue Symbicort 160 Continue Spiriva Respimat 2.5 We will send message to clinical pharmacy team to investigate cost of maintenance nebulized meds Continue to increase activity Maintain oxygen saturations above 88%  Discussed the severity of the patient's breathing on current pulmonary function test as well as the severe diffusion defect.  Discussed that at this stage it is reasonable to consider referral to palliative care and even potentially hospice to help further coordinate patient's care as well as make sure she has adequate resources at home.  Patient is open to considering a palliative care referral.  She will think about this.  Patient knows that this is an open ended offered and she can notify our office at any point in time.

## 2020-03-18 NOTE — Telephone Encounter (Signed)
03/18/2020  Had office visit with patient today.  Patient is currently maintained on Symbicort 160 and Spiriva Respimat 2.5.  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  Patient is interested in nebulized maintenance medications.  Can we investigate the cost of:  Pulmicort/budesonide nebs 0.5 twice daily Perforomist or Brovana nebulized meds twice daily Yupelri nebulized meds daily  Patient is interested in trialing these nebulized options if financially affordable long-term.  Will route to pharmacy team for investigation.  [name], FNP

## 2020-03-21 NOTE — Telephone Encounter (Signed)
03/21/2020  C,   Can we follow-up with the patient and let her know the results of the pharmacy test claims.  Looks like insurance coverage is pretty poor in regards to nebulized meds.  I would recommend remaining on Symbicort 160 and Spiriva Respimat 2.5.  [name], FNP

## 2020-03-21 NOTE — Telephone Encounter (Signed)
Patient only has Pharmacist, community in her chart. Test claims are through her [employer].

## 2020-03-21 NOTE — Telephone Encounter (Signed)
Noted thank you! [name]

## 2020-03-21 NOTE — Telephone Encounter (Signed)
Thank you. Just to clarify this is ran through part B correct?   Patricia Sampson

## 2020-03-21 NOTE — Telephone Encounter (Signed)
Ran test claims for 1 month supplies:  Pulmicort/Budesonide 0.5 nebs- $10.00 copay  Perforomist- NDC not covered- Non-formulary PA required- copay card is available for this medication.  Brovana- $125.00 copay- no copay card available  Yupelri- NDC not covered- Non-formulary PA required- copay card is available for this medication

## 2020-03-23 NOTE — Telephone Encounter (Signed)
Spoke with the pt and notified per [name] to remain on the symbicort and Spiriva  She verbalized understanding

## 2020-03-23 NOTE — Telephone Encounter (Signed)
Pt called back-- please return call anytime today

## 2020-03-23 NOTE — Telephone Encounter (Signed)
Left message for patient to call back for results.

## 2020-04-15 ENCOUNTER — Other Ambulatory Visit: Payer: Self-pay

## 2020-04-15 ENCOUNTER — Ambulatory Visit (INDEPENDENT_AMBULATORY_CARE_PROVIDER_SITE_OTHER): Payer: BC Managed Care – PPO | Admitting: Emergency Medicine

## 2020-04-15 ENCOUNTER — Encounter: Payer: Self-pay | Admitting: Emergency Medicine

## 2020-04-15 DIAGNOSIS — J449 Chronic obstructive pulmonary disease, unspecified: Secondary | ICD-10-CM | POA: Diagnosis not present

## 2020-04-15 MED ORDER — SPIRIVA RESPIMAT 2.5 MCG/ACT IN AERS: 2.0000 | INHALATION_SPRAY | Freq: Every day | RESPIRATORY_TRACT | 5 refills | Status: AC

## 2020-04-15 NOTE — Progress Notes (Signed)
Virtual Visit via Telephone Note  I connected with Patricia Sampson on 04/15/20 at  2:15 PM EDT by telephone and verified that I am speaking with the correct person using two identifiers.  Location: Patient: Home Provider: Office   I discussed the limitations, risks, security and privacy concerns of performing an evaluation and management service by telephone and the availability of in person appointments. I also discussed with the patient that there may be a patient responsible charge related to this service. The patient expressed understanding and agreed to proceed.   History of Present Illness: 64 year old woman with severe COPD and associated chronic hypoxemic respiratory failure, secondary pulmonary hypertension.  She was diagnosed with COVID-19 10/2019, experienced pneumonitis/pneumonia and increased hypoxemia.  We have talked about possible going to pulmonary rehab but this was not possible due to limitations from her insurance.  She was seen by B. Mack in our office 03/18/2020, with PFT Currently managed on Symbicort and Spiriva Respimat.    Observations/Objective: The PFT from 03/18/2020 reviewed by me, show very severe obstruction without a positive bronchodilator response, possible superimposed restriction based on decreased TLC, severely decreased diffusion capacity.  FEV1 0.59 L, down from 0.8 L in 2016. She reports that she is a bit better - she is able to ambulate some, is on 6-8L/min.  Rare albuterol use No cough or wheeze.   Assessment and Plan: Severe COPD - continue Spiriva and Symbicort - albuterol prn   Chronic hypoxemic respiratory failure - O2 at 6-8L/min  Secondary pulmonary hypertension - continue adequate oxygenation.   Follow Up Instructions: Follow with Dr Lamonte Sakai in 3 months or sooner if you have any problems.    I discussed the assessment and treatment plan with the patient. The patient was provided an opportunity to ask questions and all were answered.  The patient agreed with the plan and demonstrated an understanding of the instructions.   The patient was advised to call back or seek an in-person evaluation if the symptoms worsen or if the condition fails to improve as anticipated.  I provided 15 minutes of non-face-to-face time during this encounter.   Collene Gobble, MD

## 2020-04-15 NOTE — Addendum Note (Signed)
Addended by: Gavin Potters R on: 04/15/2020 03:54 PM   Modules accepted: Orders

## 2020-07-18 ENCOUNTER — Encounter: Payer: Self-pay | Admitting: Emergency Medicine

## 2020-07-18 ENCOUNTER — Other Ambulatory Visit: Payer: Self-pay

## 2020-07-18 ENCOUNTER — Ambulatory Visit (INDEPENDENT_AMBULATORY_CARE_PROVIDER_SITE_OTHER): Payer: Self-pay | Admitting: Emergency Medicine

## 2020-07-18 DIAGNOSIS — J9611 Chronic respiratory failure with hypoxia: Secondary | ICD-10-CM

## 2020-07-18 DIAGNOSIS — J449 Chronic obstructive pulmonary disease, unspecified: Secondary | ICD-10-CM

## 2020-07-18 MED ORDER — ALBUTEROL SULFATE HFA 108 (90 BASE) MCG/ACT IN AERS: 2.0000 | INHALATION_SPRAY | Freq: Four times a day (QID) | RESPIRATORY_TRACT | 6 refills | Status: DC | PRN

## 2020-07-18 NOTE — Assessment & Plan Note (Signed)
Continue your oxygen at 6 to 8 L/min.  Titrate to keep your oxygen level greater than 90%.

## 2020-07-18 NOTE — Progress Notes (Signed)
   Subjective:    Patient ID: Patricia Sampson, female    DOB: [birth year], 64 y.o.   MRN: [id number]  COPD   ROV 01/07/20 --[name] has severe COPD with associated chronic hypoxemic respiratory failure and secondary pulmonary hypertension.  She unfortunately was diagnosed with COVID-19 in December 2020 and experienced an increased oxygen need.  Her chest x-ray was consistent with pneumonia. She is on scheduled prednisone, ran out of it and wants to stay off of it. She is using 6-8L/min. She does still desat with exertion. She is on Spiriva alone. Doesn't have albuterol.   ROV 07/18/20 --64 year old woman with severe COPD, chronic hypoxemic respiratory failure and secondary pulmonary hypertension.  Severe obstruction confirmed on her pulmonary function testing 03/18/2020, viewed by me.  She had COVID-19 pneumonia in December 2020.  Currently managed on Symbicort and Spiriva Respimat. She is quite sedentary. Using O2 at 6-8L/min. No prednisone or abx since last time.    No flowsheet data found.      Objective:   Physical Exam Vitals:   07/18/20 0950  BP: 124/66  Pulse: (!) 107  Temp: 98.3 F (36.8 C)  TempSrc: Temporal  SpO2: 92%  Weight: 230 lb 6.4 oz (104.5 kg)  Height: _0  (1.753 m)   Gen: Pleasant, overwt, in no distress,  normal affect  ENT: No lesions,  mouth clear,  oropharynx clear, no postnasal drip  Neck: No JVD, no stridor  Lungs: No use of accessory muscles, distant, clear without rales or rhonchi  Cardiovascular: RRR, heart sounds normal, no murmur or gallops, no peripheral edema  Musculoskeletal: No deformities, no cyanosis or clubbing  Neuro: alert, non focal  Skin: Warm, no lesions or rashes     Assessment & Plan:  COPD (chronic obstructive pulmonary disease) (HCC) No flares. Limited functional capacity Please continue Symbicort 2 puffs twice a day.  Rinse and gargle after using. Continue your Spiriva Respimat 2 inhalations once daily We will write a  paper prescription for an albuterol inhaler.  Please keep this available to use 2 puffs up to every 4 hours if needed for shortness of breath, chest tightness, wheezing. COVID-19 vaccine is up-to-date.  Please keep listing for new information regarding the timing of booster shots available for patients with chronic illnesses. Follow with Dr Lamonte Sakai in 6 months or sooner if you have any problems  Chronic respiratory failure with hypoxia ([place]) Continue your oxygen at 6 to 8 L/min.  Titrate to keep your oxygen level greater than 90%.  Baltazar Apo, MD, PhD 07/18/2020, 10:13 AM [facility] [phone] or if no answer [phone]

## 2020-07-18 NOTE — Patient Instructions (Signed)
Please continue Symbicort 2 puffs twice a day.  Rinse and gargle after using. Continue your Spiriva Respimat 2 inhalations once daily We will write a paper prescription for an albuterol inhaler.  Please keep this available to use 2 puffs up to every 4 hours if needed for shortness of breath, chest tightness, wheezing. Continue your oxygen at 6 to 8 L/min.  Titrate to keep your oxygen level greater than 90%. COVID-19 vaccine is up-to-date.  Please keep listing for new information regarding the timing of booster shots available for patients with chronic illnesses. Follow with Dr Lamonte Sakai in 6 months or sooner if you have any problems

## 2020-07-18 NOTE — Assessment & Plan Note (Signed)
No flares. Limited functional capacity Please continue Symbicort 2 puffs twice a day.  Rinse and gargle after using. Continue your Spiriva Respimat 2 inhalations once daily We will write a paper prescription for an albuterol inhaler.  Please keep this available to use 2 puffs up to every 4 hours if needed for shortness of breath, chest tightness, wheezing. COVID-19 vaccine is up-to-date.  Please keep listing for new information regarding the timing of booster shots available for patients with chronic illnesses. Follow with Dr Lamonte Sakai in 6 months or sooner if you have any problems

## 2021-01-11 ENCOUNTER — Telehealth: Payer: Self-pay | Admitting: Emergency Medicine

## 2021-01-11 NOTE — Telephone Encounter (Signed)
[name] received fax regarding request for surgical clarence for pt from [facility]. Pt is scheduled for 6 month f/u on 01/25/21 and surgical clarence will be addressed at that time. Nothing further needed at this time.

## 2021-01-21 ENCOUNTER — Other Ambulatory Visit: Payer: Self-pay | Admitting: Emergency Medicine

## 2021-01-25 ENCOUNTER — Encounter: Payer: Self-pay | Admitting: Emergency Medicine

## 2021-01-25 ENCOUNTER — Other Ambulatory Visit: Payer: Self-pay

## 2021-01-25 ENCOUNTER — Ambulatory Visit (INDEPENDENT_AMBULATORY_CARE_PROVIDER_SITE_OTHER): Payer: 59 | Admitting: Emergency Medicine

## 2021-01-25 DIAGNOSIS — J449 Chronic obstructive pulmonary disease, unspecified: Secondary | ICD-10-CM

## 2021-01-25 DIAGNOSIS — J9611 Chronic respiratory failure with hypoxia: Secondary | ICD-10-CM

## 2021-01-25 NOTE — Progress Notes (Signed)
   Subjective:    Patient ID: Patricia Sampson, female    DOB: [birth year], 65 y.o.   MRN: [id number]  COPD   ROV 01/07/20 --Patricia Sampson has severe COPD with associated chronic hypoxemic respiratory failure and secondary pulmonary hypertension.  She unfortunately was diagnosed with COVID-19 in December 2020 and experienced an increased oxygen need.  Her chest x-ray was consistent with pneumonia. She is on scheduled prednisone, ran out of it and wants to stay off of it. She is using 6-8L/min. She does still desat with exertion. She is on Spiriva alone. Doesn't have albuterol.   ROV 07/18/20 --65 year old woman with severe COPD, chronic hypoxemic respiratory failure and secondary pulmonary hypertension.  Severe obstruction confirmed on her pulmonary function testing 03/18/2020, viewed by me.  She had COVID-19 pneumonia in December 2020.  Currently managed on Symbicort and Spiriva Respimat. She is quite sedentary. Using O2 at 6-8L/min. No prednisone or abx since last time.   ROV 01/25/21 --follow-up visit for 65 year old woman with severe COPD and associated chronic hypoxemic respiratory failure with secondary pulmonary hypertension.  She has severe obstruction by PFT.  She unfortunately had COVID-19 pneumonia in December 2020, has required 6 to 8 L/min.  Currently managed on Symbicort and Spiriva.  She uses albuterol .  Very limited functional capacity. She has a lot of body aches, fatigue.  No flares since last time, no antibiotics, no prednisone since our last visit.    No flowsheet data found.     Objective:   Physical Exam Vitals:   01/25/21 1456  BP: 136/70  Pulse: (!) 125  Temp: (!) 97.4 F (36.3 C)  TempSrc: Temporal  Weight: 214 lb 12.8 oz (97.4 kg)  Height: _0  (1.753 m)   Gen: Pleasant, overwt, in no distress,  normal affect  ENT: No lesions,  mouth clear,  oropharynx clear, no postnasal drip  Neck: No JVD, no stridor  Lungs: No use of accessory muscles, distant, clear without  rales or rhonchi  Cardiovascular: RRR, heart sounds normal, no murmur or gallops, no peripheral edema  Musculoskeletal: No deformities, no cyanosis or clubbing  Neuro: alert, non focal  Skin: Warm, no lesions or rashes     Assessment & Plan:  COPD (chronic obstructive pulmonary disease) (HCC) Your COPD and chronic oxygen needs puts you at high risk for general anesthesia and at increased risk for any sort of sedation.  That being said your COPD is stable on your current medications, and there is no absolute contraindication to getting your gastroenterology procedures as planned if the benefits outweigh the risks described. Continue your Symbicort and Spiriva as you have been taking them. Continue to keep albuterol available to use up to every 4 hours if needed for shortness of breath, chest tightness, wheezing. Follow with Dr Lamonte Sakai in 6 months or sooner if you have any problems  Chronic respiratory failure with hypoxia [facility]) Continue your oxygen at 6-8 L/min  Baltazar Apo, MD, PhD 01/25/2021, 3:00 PM [facility] [phone] or if no answer [phone]

## 2021-01-25 NOTE — Patient Instructions (Addendum)
Your COPD and chronic oxygen needs puts you at high risk for general anesthesia and at increased risk for any sort of sedation.  That being said your COPD is stable on your current medications, and there is no absolute contraindication to getting your gastroenterology procedures as planned if the benefits outweigh the risks described. Continue your Symbicort and Spiriva as you have been taking them. Continue to keep albuterol available to use up to every 4 hours if needed for shortness of breath, chest tightness, wheezing. Continue your oxygen at 6-8 L/min Follow with Dr Lamonte Sakai in 6 months or sooner if you have any problems

## 2021-01-25 NOTE — Addendum Note (Signed)
Addended by: Gavin Potters R on: 01/25/2021 05:25 PM   Modules accepted: Orders

## 2021-01-25 NOTE — Assessment & Plan Note (Signed)
Continue your oxygen at 6-8 L/min

## 2021-01-25 NOTE — Assessment & Plan Note (Signed)
Your COPD and chronic oxygen needs puts you at high risk for general anesthesia and at increased risk for any sort of sedation.  That being said your COPD is stable on your current medications, and there is no absolute contraindication to getting your gastroenterology procedures as planned if the benefits outweigh the risks described. Continue your Symbicort and Spiriva as you have been taking them. Continue to keep albuterol available to use up to every 4 hours if needed for shortness of breath, chest tightness, wheezing. Follow with Dr Lamonte Sakai in 6 months or sooner if you have any problems

## 2021-05-01 ENCOUNTER — Telehealth: Payer: Self-pay | Admitting: Emergency Medicine

## 2021-05-01 DIAGNOSIS — R918 Other nonspecific abnormal finding of lung field: Secondary | ICD-10-CM

## 2021-05-01 NOTE — Telephone Encounter (Signed)
Spoke w Dr Burnett Sheng. Had a CT chest done at [facility] that shows a lung mass. I will review and help plan next steps.

## 2021-05-01 NOTE — Telephone Encounter (Signed)
I spoke with the patient and reviewed the CT scan with her.  I think the safest and most appropriate strategy to achieve a tissue diagnosis would be a TTNA and interventional radiology.  She is interested in getting a tissue diagnosis, would be interested in treatment.  I will make the referral and work on getting this set up here in [place].

## 2021-05-01 NOTE — Telephone Encounter (Signed)
ATC patient x1, reached a busy signal.

## 2021-05-02 NOTE — Telephone Encounter (Signed)
This is getting scheduled by IR.

## 2021-05-03 ENCOUNTER — Telehealth: Payer: Self-pay

## 2021-05-03 DIAGNOSIS — R918 Other nonspecific abnormal finding of lung field: Secondary | ICD-10-CM

## 2021-05-03 NOTE — Telephone Encounter (Signed)
-----  Message from Collene Gobble, MD sent at 05/03/2021 10:31 AM EDT ----- Regarding: FW: CT LUNG MASS BIOPSY We will order PET asap. Dx is LUL mass.  Thanks   ----- Message ----- From: Garth Bigness D Sent: 05/02/2021   8:36 AM EDT To: Collene Gobble, MD Subject: FW: CT LUNG MASS BIOPSY                        Please see review below from Dr. Serafina Royals.  ----- Message ----- From: Suzette Battiest, MD Sent: 05/02/2021   8:17 AM EDT To: Jillyn Hidden Subject: RE: CT LUNG MASS BIOPSY                        Recommend PET/CT prior to biopsy.  Dylan ----- Message ----- From: Garth Bigness D Sent: 05/01/2021   4:56 PM EDT To: Ir Procedure Requests Subject: CT LUNG MASS BIOPSY                            Procedure:  CT LUNG MASS BIOPSY  Reason:  Mass of upper lobe of left lung  History:    CT done at [facility]  Provider:  Collene Gobble  Provider Contact:  [phone]

## 2021-05-03 NOTE — Telephone Encounter (Signed)
Called and spoke with patient to let her know that radiology is recommending for her to get a PET scan before having her biopsy done and that [name] wanted [place] to touch base with her to make sure she is ok with having it done. She expressed understanding. Order has been placed. Nothing further needed at this time.

## 2021-05-04 ENCOUNTER — Telehealth: Payer: Self-pay | Admitting: Emergency Medicine

## 2021-05-04 NOTE — Telephone Encounter (Signed)
I would wait until after the PET scan to get your booster shot

## 2021-05-04 NOTE — Telephone Encounter (Signed)
Called and spoke with patient Patient would like to know if ok to get Covid 19 booster vaccine befor PET scan. Per patient this would 1st booster vaccine and has PET scanned scheduled 05/17/21.   Dr Lamonte Sakai please advise.

## 2021-05-04 NOTE — Telephone Encounter (Signed)
Called and spoke with patient to let her know of Dr. Sudie Bailey recs. She expressed understanding. Nothing further needed at this time.

## 2021-05-16 ENCOUNTER — Telehealth: Payer: Self-pay | Admitting: Emergency Medicine

## 2021-05-16 NOTE — Telephone Encounter (Signed)
Pt is calling in regards to PET scan that was cancelled and wanted to speak with a nurse in regards to getting something rescheduled ASAP. Pls regard; [phone]

## 2021-05-17 ENCOUNTER — Encounter (HOSPITAL_COMMUNITY): Payer: 59

## 2021-05-18 ENCOUNTER — Telehealth: Payer: Self-pay | Admitting: Emergency Medicine

## 2021-05-18 NOTE — Telephone Encounter (Signed)
PCC's please advise. Thanks

## 2021-05-22 ENCOUNTER — Telehealth: Payer: Self-pay | Admitting: Emergency Medicine

## 2021-05-22 NOTE — Telephone Encounter (Signed)
I have added this message to the other open one

## 2021-05-22 NOTE — Telephone Encounter (Signed)
Spoke with pt and notified her that PCCs were still waiting to hear from insurance regarding rescheduling PET scan. Informed pt that once PCCs heard from insurance they would reach out to pt to reschedule PET. Will leave encounter open for decimation.

## 2021-05-22 NOTE — Telephone Encounter (Signed)
I have called the insurance seems they never got the fax I have verified the fax number and faxed it again

## 2021-05-25 NOTE — Telephone Encounter (Signed)
Nothing noted in message. Will close encounter.  

## 2021-05-25 NOTE — Telephone Encounter (Signed)
I called Dr Florina Ou office they faxed me the report it has been faxed to the ins company

## 2021-05-25 NOTE — Telephone Encounter (Signed)
I got a fax back from patient's insurance company I need the Ct report from [place] to send to prove there is a mass

## 2021-05-31 NOTE — Telephone Encounter (Signed)
I called the insurance company again today to see where we are on the auth and they stated they were still waiting on the report I had faxed said report on 06/30 and gotten confirmation I have faxed records again to the number they provide and I have Confirmation again that the fax went through. I have also called patient and let her know.

## 2021-06-02 ENCOUNTER — Telehealth: Payer: Self-pay | Admitting: Emergency Medicine

## 2021-06-02 NOTE — Telephone Encounter (Signed)
Spoke to pt I will work on precert 1st thing Monday morning to try to precert this PET Patricia Sampson

## 2021-06-05 ENCOUNTER — Ambulatory Visit: Payer: 59 | Admitting: Primary Care

## 2021-06-05 NOTE — Progress Notes (Signed)
  Contacted today for peer- to peer d/t PET CT being denied by patient insurance. Patient of [name], see on 01/25/21 for COPD. Lung function is FEV1 27% predicted. Patient had CT chest done at [facility] that showed a lung mass upper lobe left lung. [name] recommending tissue diagnosis. Referred to IR. Dr. Ruthann Cancer with radiology recommending PET/CT prior to biopsy. PET scan was cancelled d/t it not being authorized.    Recommending pet imaging to see if there is a better/safer lesion to bx to decrease risk pneumothorax or other pulmonary complications from biopsy   Approved, ok to schedule PET

## 2021-06-05 NOTE — Telephone Encounter (Signed)
This has gone into a peer to peer and will be given to Dr Lamonte Sakai Joellen Jersey

## 2021-06-05 NOTE — Progress Notes (Deleted)
  Contacted today for peer- to peer d/t PET CT being denied by patient insurance. Patient of [name], see on 01/25/21 for COPD. Patient had CT chest done at [facility] that showed a lung mass upper lobe left lung. [name] recommending tissue diagnosis. Referred to IR. Dr. Ruthann Cancer with radiology recommending PET/CT prior to biopsy. PET scan was cancelled d/t it not being authorized.   Recommending pet imaging to see if there is a better/safer lesion to bx to decrease risk pneumothorax or other pulmonary complications from biopsy

## 2021-06-06 ENCOUNTER — Ambulatory Visit (INDEPENDENT_AMBULATORY_CARE_PROVIDER_SITE_OTHER): Payer: 59 | Admitting: Primary Care

## 2021-06-06 ENCOUNTER — Encounter: Payer: Self-pay | Admitting: Primary Care

## 2021-06-06 DIAGNOSIS — R918 Other nonspecific abnormal finding of lung field: Secondary | ICD-10-CM

## 2021-06-06 NOTE — Telephone Encounter (Signed)
Called and spoke with patient. She is aware that a peer to peer will done this afternoon. Nothing further needed.

## 2021-06-06 NOTE — Patient Instructions (Signed)
PET scan approved

## 2021-06-15 NOTE — Telephone Encounter (Signed)
Patricia Sampson, can this encounter be closed? Thanks.

## 2021-06-15 NOTE — Telephone Encounter (Signed)
Yes [place] was the last one who worked on it I believe and its authorized now so I will close it

## 2021-06-23 ENCOUNTER — Other Ambulatory Visit: Payer: Self-pay

## 2021-06-23 ENCOUNTER — Encounter (HOSPITAL_COMMUNITY)
Admission: RE | Admit: 2021-06-23 | Discharge: 2021-06-23 | Disposition: A | Discharge status: Home / Self Care | Payer: 59 | Source: Ambulatory Visit | Attending: Emergency Medicine | Admitting: Emergency Medicine

## 2021-06-23 DIAGNOSIS — R918 Other nonspecific abnormal finding of lung field: Secondary | ICD-10-CM | POA: Insufficient documentation

## 2021-06-23 LAB — GLUCOSE, CAPILLARY: Glucose-Capillary: 124 mg/dL — ABNORMAL HIGH (ref 70–99)

## 2021-06-23 IMAGING — CT NM PET TUM IMG INITIAL (PI) SKULL BASE T - THIGH
8 series · 25 of 25 positions shown · non-contrast
Comparison: CTA chest [DATE] and abdominopelvic CT [DATE],
for both from [REDACTED].

CLINICAL DATA: Initial treatment strategy for left upper lobe lung
mass on chest CT.

EXAM:
NUCLEAR MEDICINE PET SKULL BASE TO THIGH
TECHNIQUE: 11.8 mCi F-18 FDG was injected intravenously. Full-ring PET imaging
was performed from the skull base to thigh after the radiotracer. CT
data was obtained and used for attenuation correction and anatomic
localization.
Fasting blood glucose: 124 mg/dl

[Series 3: pet sk_thigh ac · axial · 5.0mm · 4.07mm/px · z∈[-1254,-430]mm · 4 of 207 slices shown]
[im 1/207]
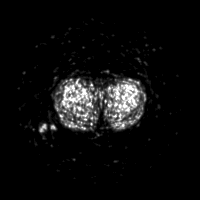
[im 69/207]
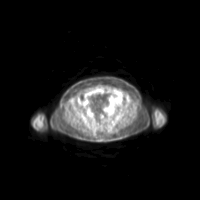
[im 138/207]
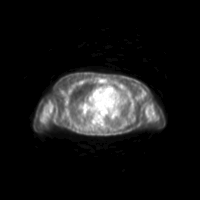
[im 207/207]
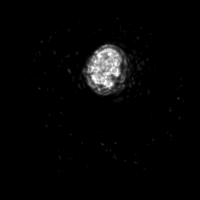

[Series 4: ct sk_thigh 5.0 bf37 · axial · 5.0mm · 0.98mm/px · z∈[-1254,-430]mm · 5 of 207 slices shown]
[im 1/207]
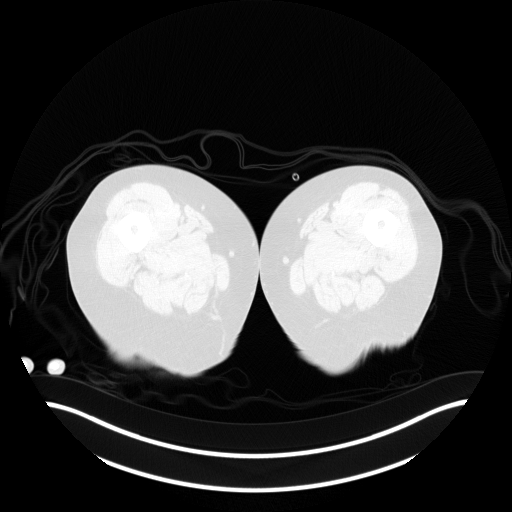
[im 52/207]
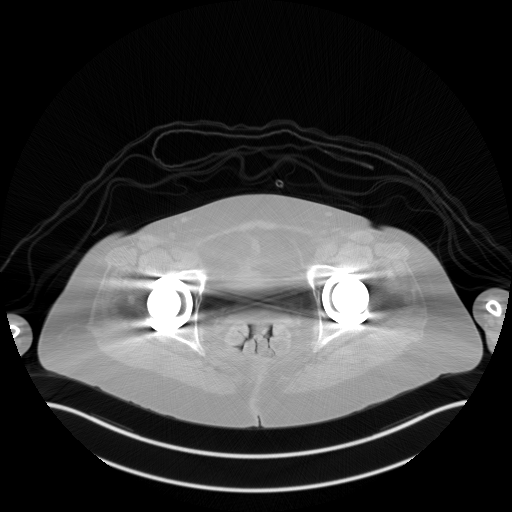
[im 104/207]
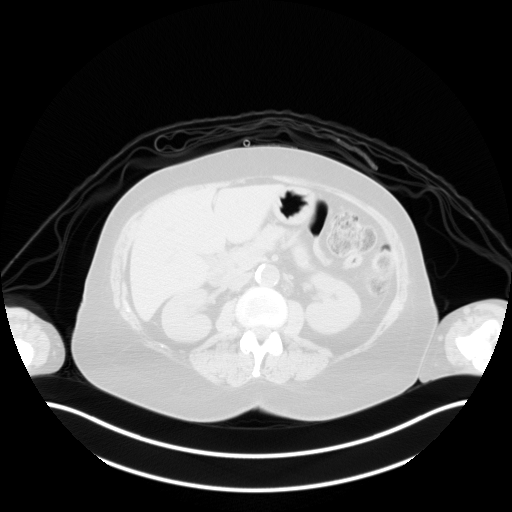
[im 155/207]
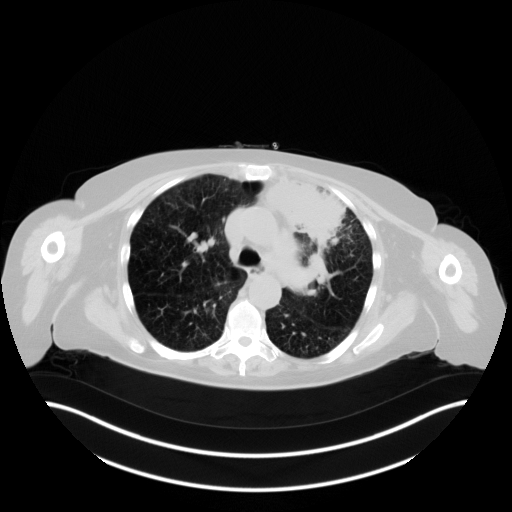
[im 207/207  brain]
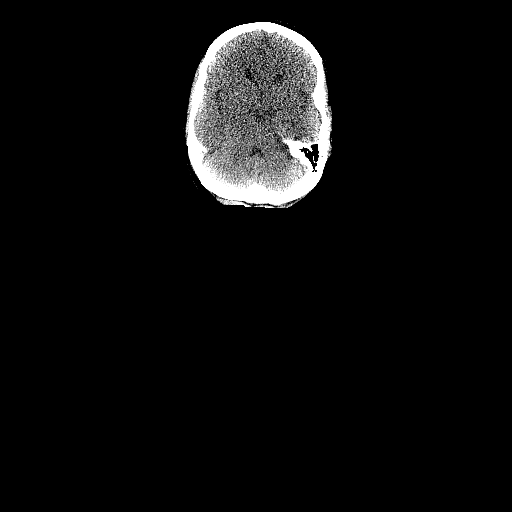

[Series 5: pet sk_thigh nac · axial · 5.0mm · 4.07mm/px · z∈[-1254,-430]mm · 5 of 207 slices shown]
[im 1/207]
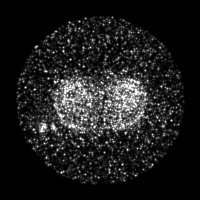
[im 52/207]
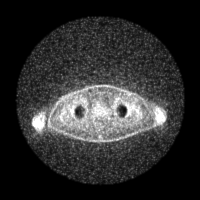
[im 104/207]
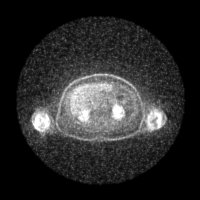
[im 155/207]
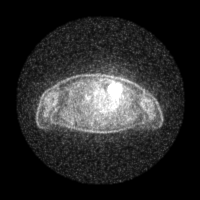
[im 207/207]
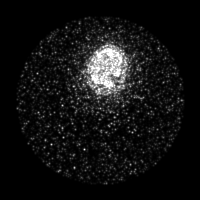

[Series 8: ct sk_thigh 5.0 br59 lung_bone · axial · 5.0mm · 0.64mm/px · z∈[-779,-535]mm · 2 of 62 slices shown]
[im 1/62]
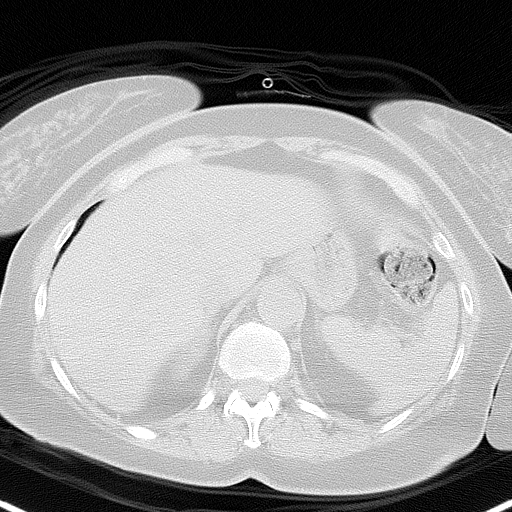
[im 62/62]
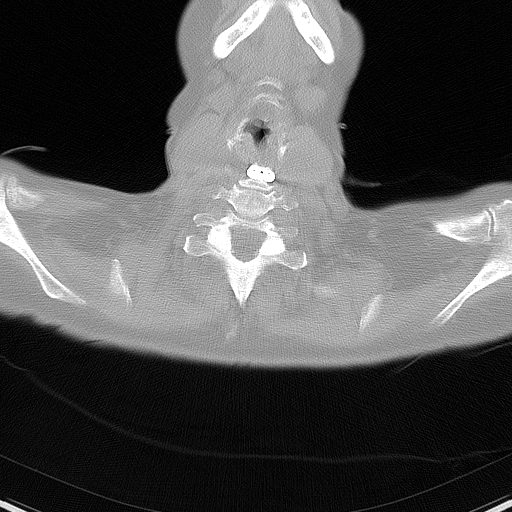

[Series 603: <mip collection> · coronal · 1.71mm/px · 1 of 32 slices shown]
[im 1/32]
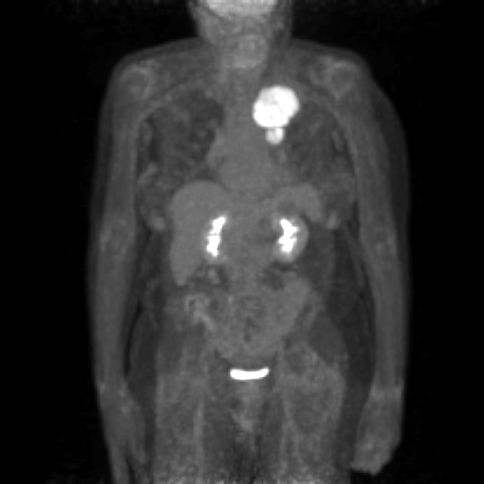

[Series 604: fused cor · 2 of 68 slices shown]
[im 1/68]
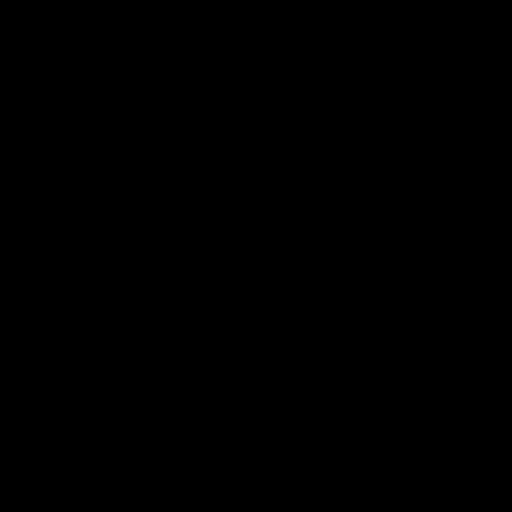
[im 68/68]
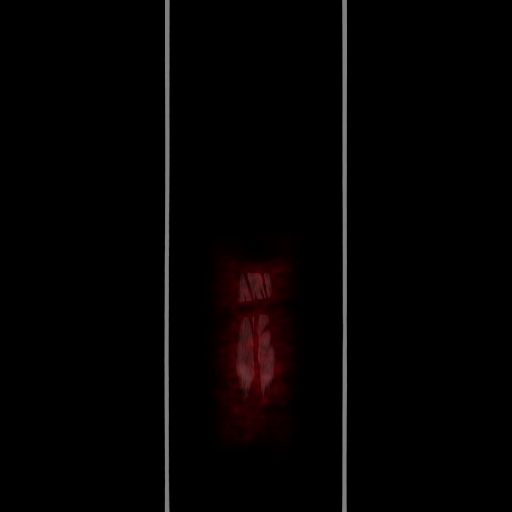

[Series 605: range-ct sk_thigh 5.0 bf37-tra-<alpha range> · 5 of 202 slices shown]
[im 1/202]
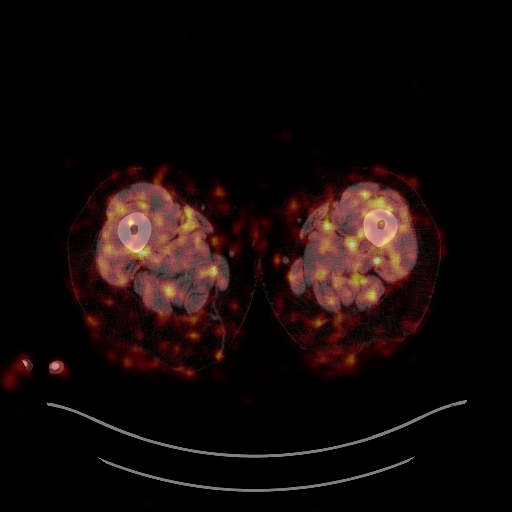
[im 51/202]
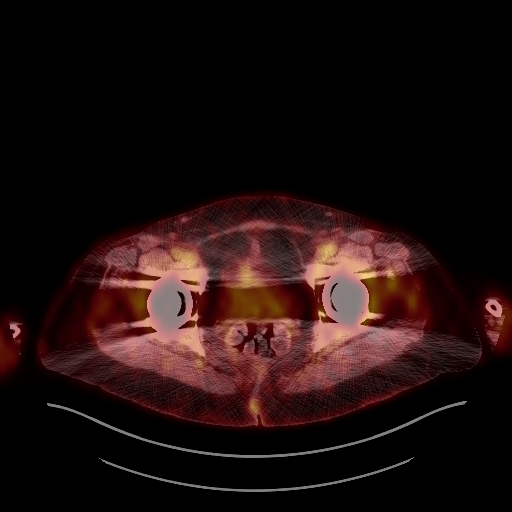
[im 101/202]
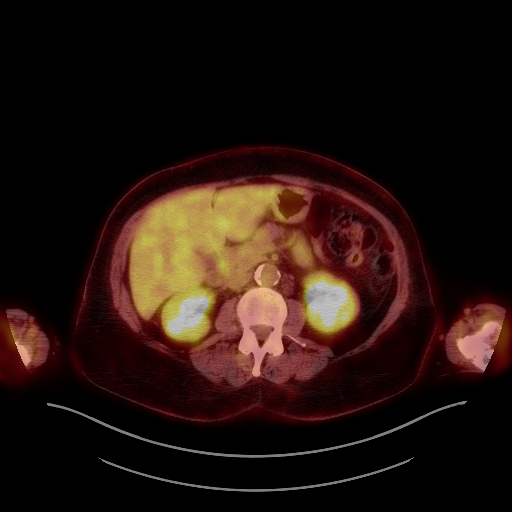
[im 151/202]
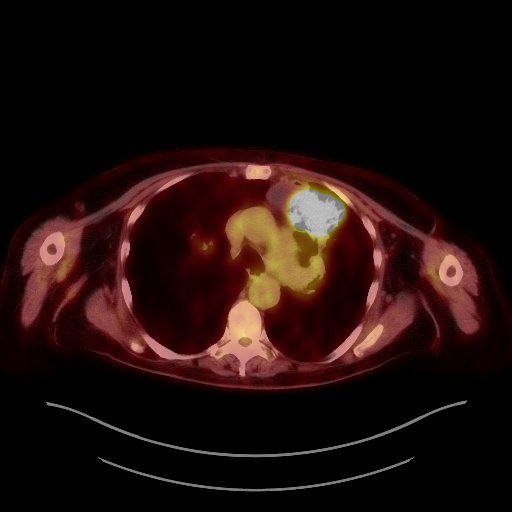
[im 202/202]
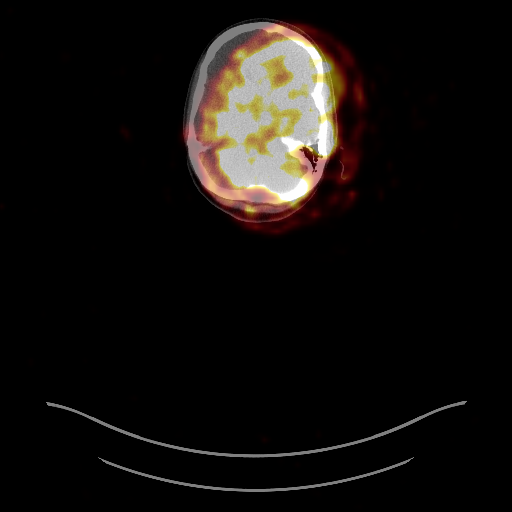

[Series 1110: results mm oncology reading · 3.0mm · 0.94mm/px · 1 of 3 slices shown]
[im 1/3]
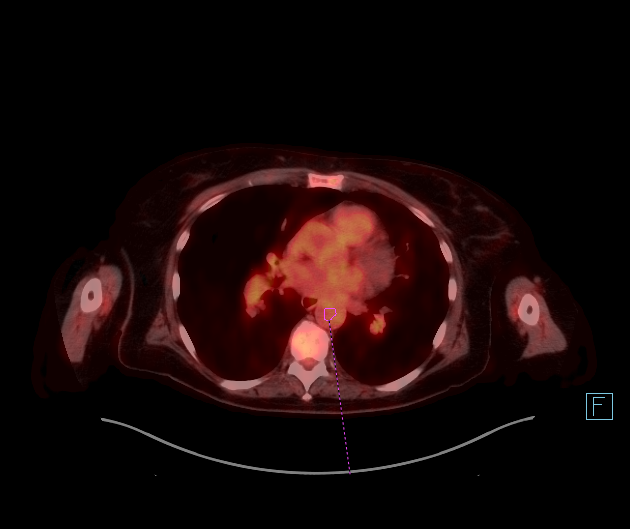

[25 of 25 positions shown; findings below may reference images not displayed]

FINDINGS: Mediastinal blood pool activity: SUV max

Liver activity: SUV max NA

NECK: No areas of abnormal hypermetabolism.

Incidental CT findings: No cervical adenopathy.

CHEST: Hypermetabolic left upper lobe lung mass. This measures 8.8 x
6.3 cm and a S.U.V. max of 37.1 on [DATE]. (Previously 8.4 x 6.1 cm.)

Demonstrates progressive chest wall invasion including destruction
of the anterior left second rib on 16 and 20 of series 8.

Hypermetabolic left suprahilar nodal metastasis at 3.7 x 2.3 cm and
a S.U.V. max of 21.0 on 52/4. Similar in size to on the prior.

Incidental CT findings: No pleural fluid. Mild cardiomegaly. Aortic
atherosclerosis. Moderate bullous emphysema.

ABDOMEN/PELVIS: No abdominopelvic parenchymal or nodal
hypermetabolism.

Incidental CT findings: Normal adrenal glands. Abdominal aortic
atherosclerosis. Degraded evaluation of the pelvis, secondary to
beam hardening artifact from bilateral hip arthroplasty. Large
colonic stool burden.

SKELETON: No abnormal marrow activity.

Incidental CT findings: none
IMPRESSION: 1. Progressive left upper lobe primary bronchogenic carcinoma with
left suprahilar nodal metastasis and direct chest wall invasion.
Presuming non-small-cell histology, [9Z] or stage IIIA.
2. Incidental findings, including: Aortic atherosclerosis
([9Z]-[9Z]), coronary artery atherosclerosis and emphysema
([9Z]-[9Z]).

## 2021-06-23 MED ORDER — FLUDEOXYGLUCOSE F - 18 (FDG) INJECTION
11.7700 | Freq: Once | INTRAVENOUS | Status: AC
  Administered 2021-06-23: 11.77 via INTRAVENOUS

## 2021-06-29 ENCOUNTER — Telehealth: Payer: Self-pay | Admitting: Emergency Medicine

## 2021-06-29 NOTE — Telephone Encounter (Signed)
Called patient but she did not answer. Left message for her to call back tomorrow.  

## 2021-06-30 ENCOUNTER — Telehealth: Payer: Self-pay | Admitting: Emergency Medicine

## 2021-06-30 DIAGNOSIS — R918 Other nonspecific abnormal finding of lung field: Secondary | ICD-10-CM

## 2021-06-30 NOTE — Telephone Encounter (Signed)
Spoke with the pt  Pt is calling for her PET results from 06/23/21  RB, can you please call her today? She is anxious and states waiting by the phone for these results. Impression:  IMPRESSION: 1. 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 Presuming non-small-cell histology, T4N1M0 or stage IIIA. 2. Incidental findings, including: Aortic atherosclerosis (ICD10-I70.0), coronary artery atherosclerosis and emphysema (ICD10-J43.9).     Electronically Signed   By: Abigail Miyamoto M.D.   On: 06/26/2021 11:57

## 2021-06-30 NOTE — Telephone Encounter (Signed)
I spoke with the patient to review her PET scan that was requested by interventional radiology prior to any planned needle biopsy.  I do not see any more accessible disease, no cervical or supraclavicular nodes, axillary nodes etc. She does have ipsilateral hilar lymphadenopathy.  It looks to me like the chest mass is the best target, it is invading the chest wall.  I discussed this with her and explained that I would attempt to reconsult interventional radiology to get the biopsy done as soon as possible.

## 2021-07-03 ENCOUNTER — Encounter (HOSPITAL_COMMUNITY): Payer: Self-pay | Admitting: Radiology

## 2021-07-03 NOTE — Progress Notes (Signed)
Suttle, Rosanne Ashing, MD  Garth Bigness D Approved for CT guided left upper lobe lung mass biopsy.   Dylan         Previous Messages    ----- Message -----  From: Garth Bigness D  Sent: 06/30/2021   5:55 PM EDT  To: Suzette Battiest, MD  Subject: RE: CT LUNG MASS BIOPSY                         Dr. Serafina Royals, patient has completed NM PET scan  ----- Message -----  From: Suzette Battiest, MD  Sent: 05/02/2021   8:17 AM EDT  To: Jillyn Hidden  Subject: RE: CT LUNG MASS BIOPSY                         Recommend PET/CT prior to biopsy.   Dylan  ----- Message -----  From: Garth Bigness D  Sent: 05/01/2021   4:56 PM EDT  To: Ir Procedure Requests  Subject: CT LUNG MASS BIOPSY                             Procedure:  CT LUNG MASS BIOPSY   Reason:  Mass of upper lobe of left lung   History:    CT done at [facility]   Provider:  Collene Gobble   Provider Contact:  [phone]

## 2021-07-04 NOTE — Telephone Encounter (Signed)
See phone note from 8/5. [name] reviewed results with pt. Will close encounter.

## 2021-07-07 ENCOUNTER — Other Ambulatory Visit: Payer: Self-pay | Admitting: Radiology

## 2021-07-10 ENCOUNTER — Encounter (HOSPITAL_COMMUNITY): Payer: Self-pay

## 2021-07-10 ENCOUNTER — Ambulatory Visit (HOSPITAL_COMMUNITY)
Admission: RE | Admit: 2021-07-10 | Discharge: 2021-07-10 | Disposition: A | Discharge status: Home / Self Care | Payer: 59 | Source: Ambulatory Visit | Attending: Interventional Radiology | Admitting: Interventional Radiology

## 2021-07-10 ENCOUNTER — Other Ambulatory Visit: Payer: Self-pay

## 2021-07-10 ENCOUNTER — Ambulatory Visit (HOSPITAL_COMMUNITY)
Admission: RE | Admit: 2021-07-10 | Discharge: 2021-07-10 | Disposition: A | Discharge status: Home / Self Care | Payer: 59 | Source: Ambulatory Visit | Attending: Emergency Medicine | Admitting: Emergency Medicine

## 2021-07-10 DIAGNOSIS — C3412 Malignant neoplasm of upper lobe, left bronchus or lung: Secondary | ICD-10-CM | POA: Insufficient documentation

## 2021-07-10 DIAGNOSIS — Z87891 Personal history of nicotine dependence: Secondary | ICD-10-CM | POA: Diagnosis not present

## 2021-07-10 DIAGNOSIS — Z7951 Long term (current) use of inhaled steroids: Secondary | ICD-10-CM | POA: Diagnosis not present

## 2021-07-10 DIAGNOSIS — R918 Other nonspecific abnormal finding of lung field: Secondary | ICD-10-CM | POA: Diagnosis present

## 2021-07-10 DIAGNOSIS — Z79899 Other long term (current) drug therapy: Secondary | ICD-10-CM | POA: Diagnosis not present

## 2021-07-10 DIAGNOSIS — J449 Chronic obstructive pulmonary disease, unspecified: Secondary | ICD-10-CM | POA: Insufficient documentation

## 2021-07-10 DIAGNOSIS — I10 Essential (primary) hypertension: Secondary | ICD-10-CM | POA: Diagnosis not present

## 2021-07-10 DIAGNOSIS — Z9889 Other specified postprocedural states: Secondary | ICD-10-CM

## 2021-07-10 LAB — CBC
HCT: 29.8 % — ABNORMAL LOW (ref 36.0–46.0)
Hemoglobin: 8.5 g/dL — ABNORMAL LOW (ref 12.0–15.0)
MCH: 22.1 pg — ABNORMAL LOW (ref 26.0–34.0)
MCHC: 28.5 g/dL — ABNORMAL LOW (ref 30.0–36.0)
MCV: 77.6 fL — ABNORMAL LOW (ref 80.0–100.0)
Platelets: 421 10*3/uL — ABNORMAL HIGH (ref 150–400)
RBC: 3.84 MIL/uL — ABNORMAL LOW (ref 3.87–5.11)
RDW: 18 % — ABNORMAL HIGH (ref 11.5–15.5)
WBC: 8.7 10*3/uL (ref 4.0–10.5)
nRBC: 0 % (ref 0.0–0.2)

## 2021-07-10 LAB — APTT: aPTT: 37 seconds — ABNORMAL HIGH (ref 24–36)

## 2021-07-10 LAB — PROTIME-INR
INR: 1.1 (ref 0.8–1.2)
Prothrombin Time: 14.2 seconds (ref 11.4–15.2)

## 2021-07-10 IMAGING — CT CT BIOPY CORE LUNG/MEDIASTINUM
1 of 2 series · 15 of 32 positions shown, 19 images · non-contrast
Comparison: none

INDICATION: 64-year-old female with left upper lobe pleural based pulmonary mass
with invasion of the overlying ribs. She presents for CT-guided core
biopsy of the same.

[Series 2: i-spiral 5.0 b40f · axial · 0.78mm/px · z∈[-216,+18]mm · 15 of 75 slices shown, 19 images]
[im 4/75  soft-tissue]
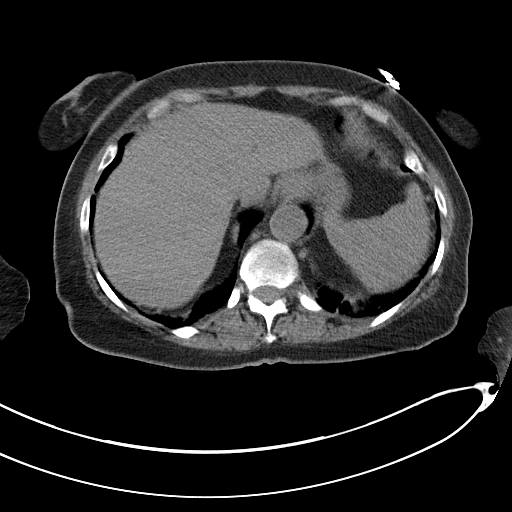
[im 4/75  bone]
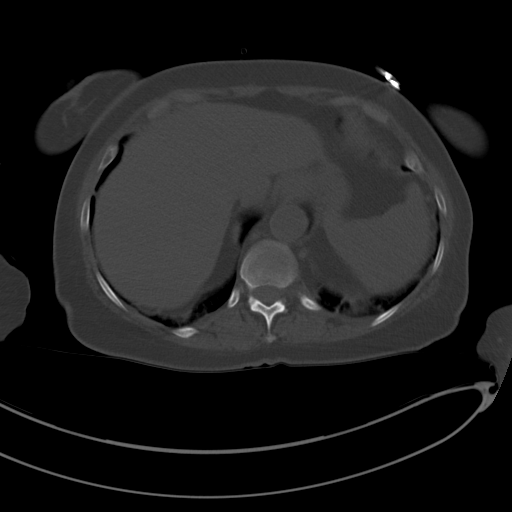
[im 11/75  soft-tissue]
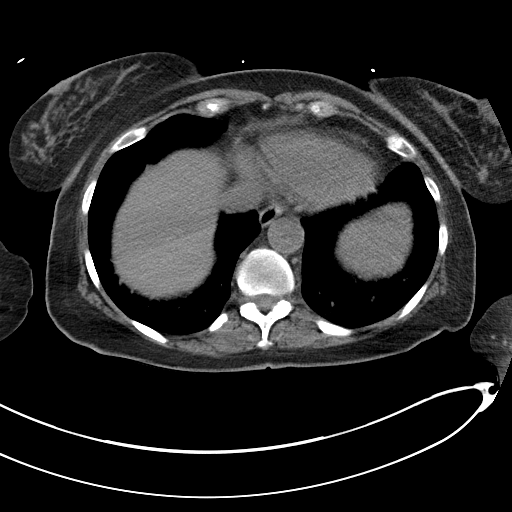
[im 17/75  soft-tissue]
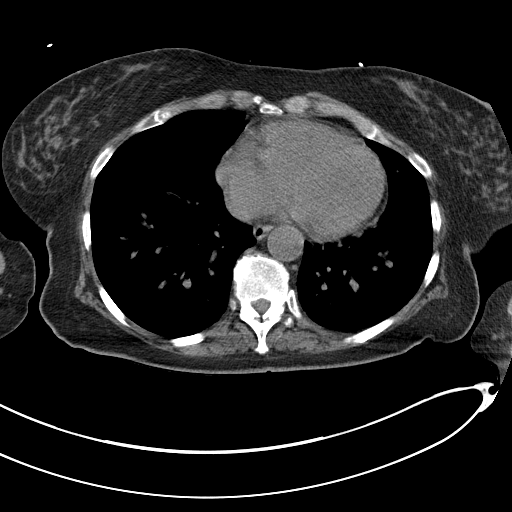
[im 21/75  soft-tissue]
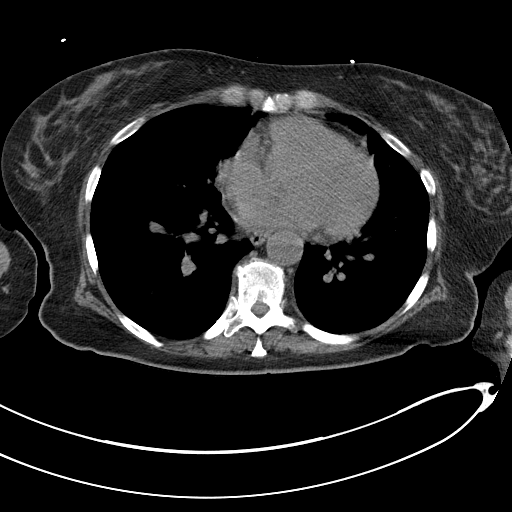
[im 27/75  soft-tissue]
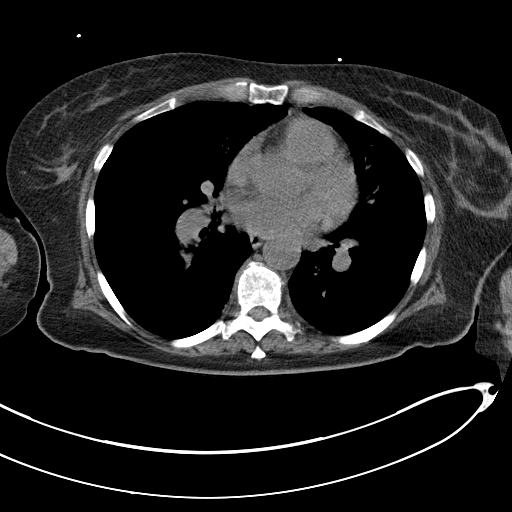
[im 31/75  soft-tissue]
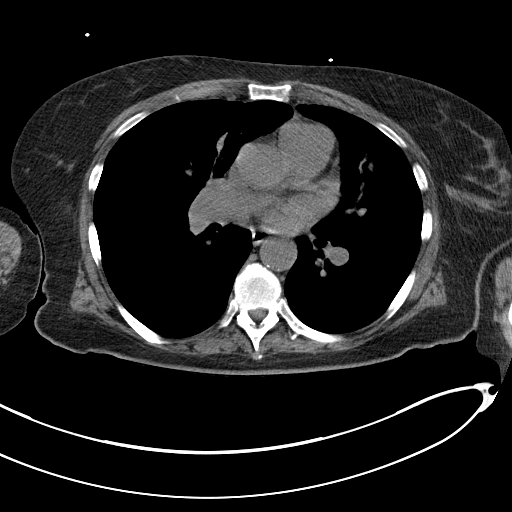
[im 38/75  soft-tissue]
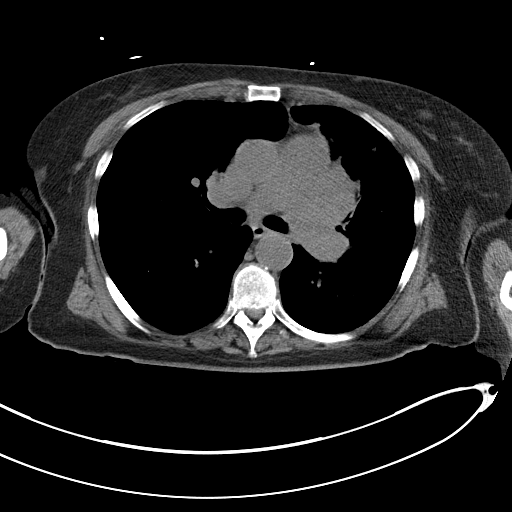
[im 44/75  soft-tissue]
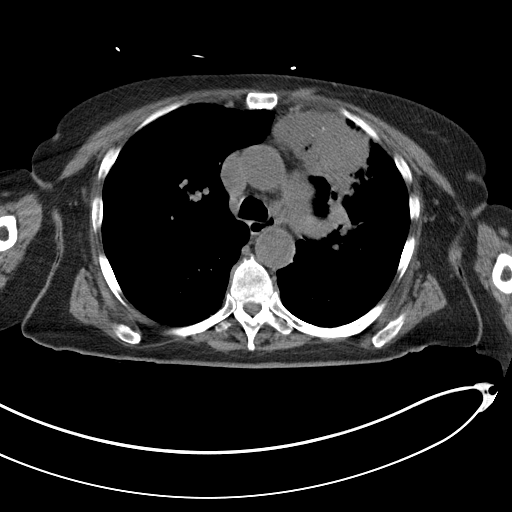
[im 48/75  soft-tissue]
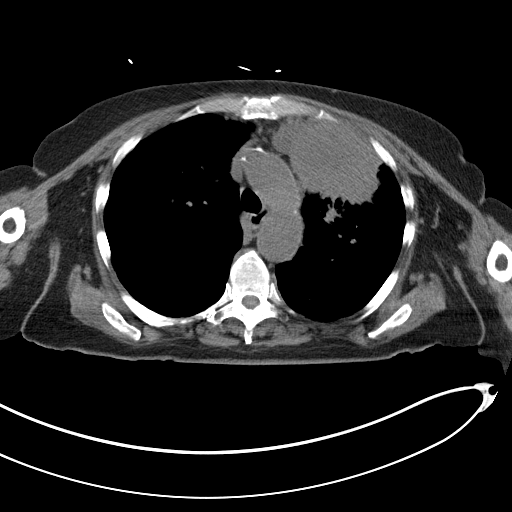
[im 48/75  bone]
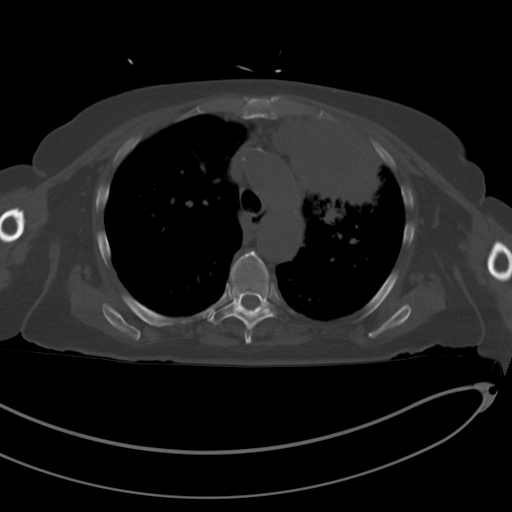
[im 54/75  soft-tissue]
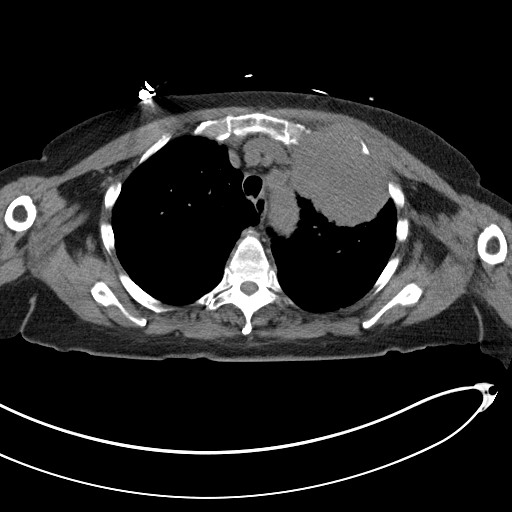
[im 58/75  soft-tissue]
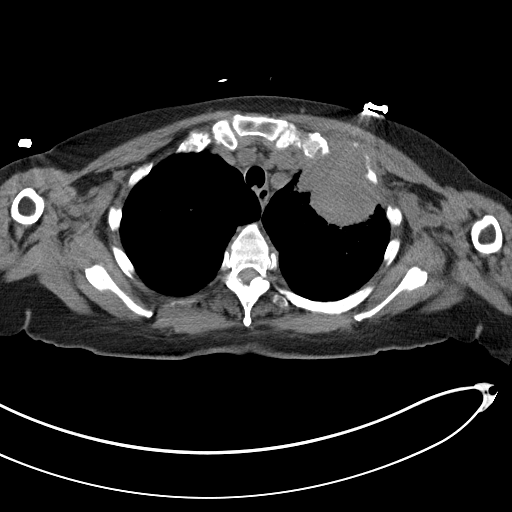
[im 61/75  lung]
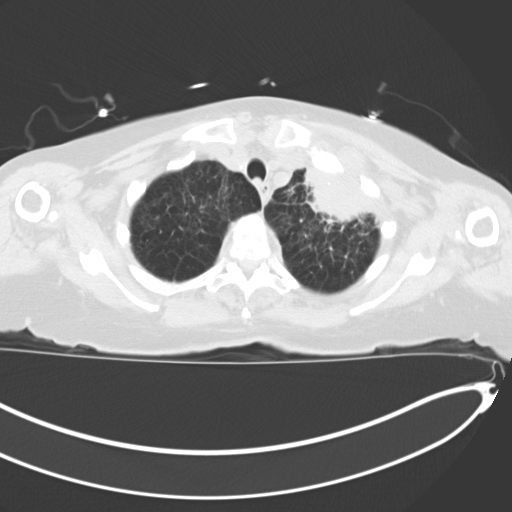
[im 64/75  soft-tissue]
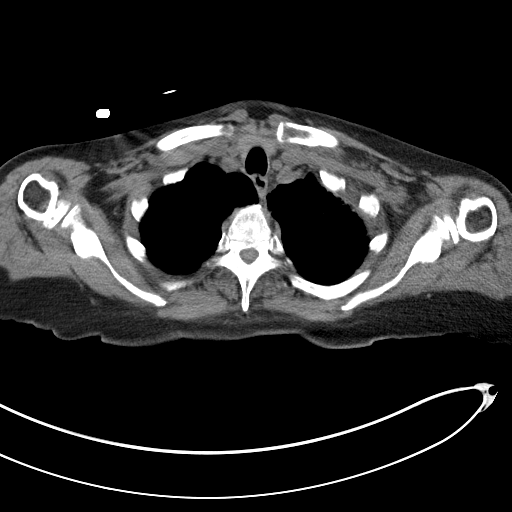
[im 64/75  lung]
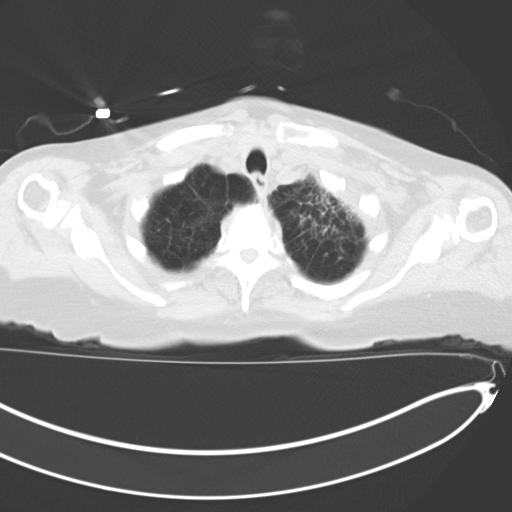
[im 68/75  lung]
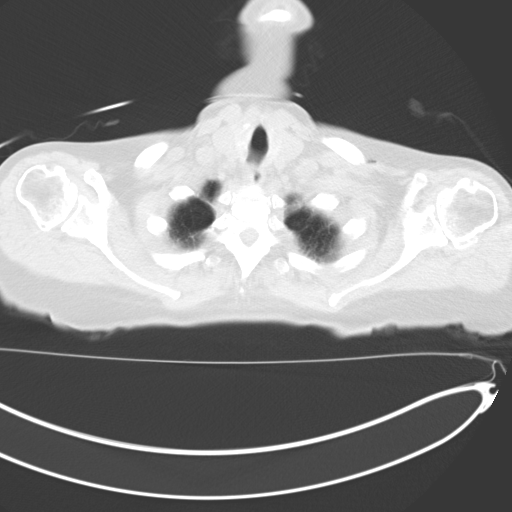
[im 71/75  soft-tissue]
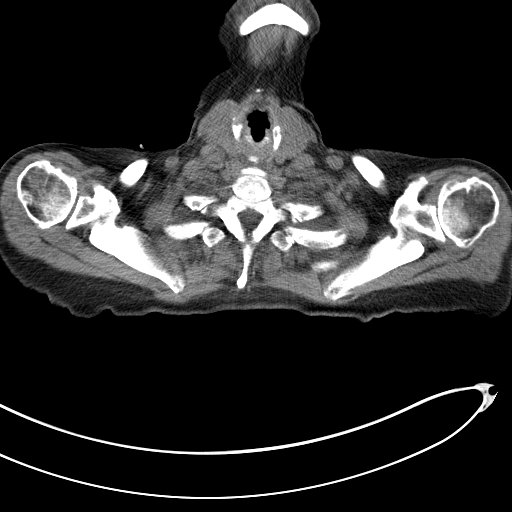
[im 71/75  lung]
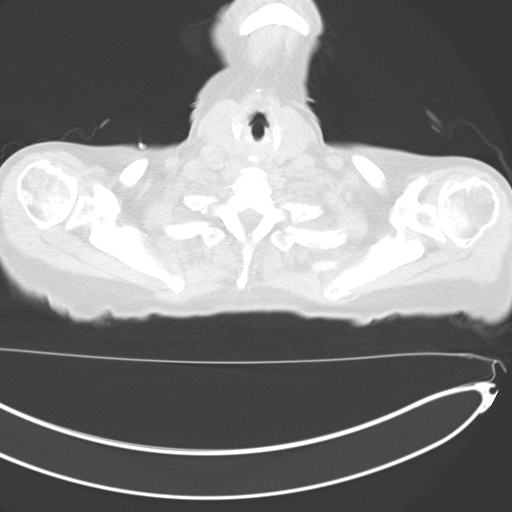

[15 of 32 positions shown; findings below may reference images not displayed]

EXAM:
CT-guided biopsy left lobe pulmonary mass

MEDICATIONS:
None.

ANESTHESIA/SEDATION:
Fentanyl 50 mcg IV; Versed 1.5 mg IV

Moderate Sedation Time:  15 minutes

The patient was continuously monitored during the procedure by the
interventional radiology nurse under my direct supervision.

FLUOROSCOPY TIME:  None.

COMPLICATIONS:
None immediate.

Estimated blood loss:  0

PROCEDURE:
Informed written consent was obtained from the patient after a
thorough discussion of the procedural risks, benefits and
alternatives. All questions were addressed. Maximal Sterile Barrier
Technique was utilized including caps, mask, sterile gowns, sterile
gloves, sterile drape, hand hygiene and skin antiseptic. A timeout
was performed prior to the initiation of the procedure.

A planning axial CT scan was performed. The mass in the left upper
lobe was successfully identified. A suitable skin entry site was
selected and marked. The region was then sterilely prepped and
draped in standard fashion using Betadine skin prep. Local
anesthesia was attained by infiltration with 1% lidocaine. A small
dermatotomy was made. Under intermittent CT fluoroscopic guidance, a
17 gauge trocar needle was advanced into the lung and positioned at
the margin of the nodule.

Multiple 18 gauge core biopsies were then coaxially obtained using
the BioPince automated biopsy device. Biopsy specimens were placed
in formalin and delivered to pathology for further analysis. The
biopsy device and introducer needle were removed. Post biopsy axial
CT imaging demonstrates no evidence of immediate complication. There
is no pneumothorax. The patient tolerated the procedure well.
IMPRESSION: Technically successful CT-guided biopsy left upper lobe pulmonary
mass.

## 2021-07-10 IMAGING — DX DG CHEST 1V PORT
1 series · 1 of 1 positions shown · non-contrast
Comparison: CT chest [DATE]

CLINICAL DATA: Status post lung biopsy.

EXAM:
PORTABLE CHEST 1 VIEW

[chest]
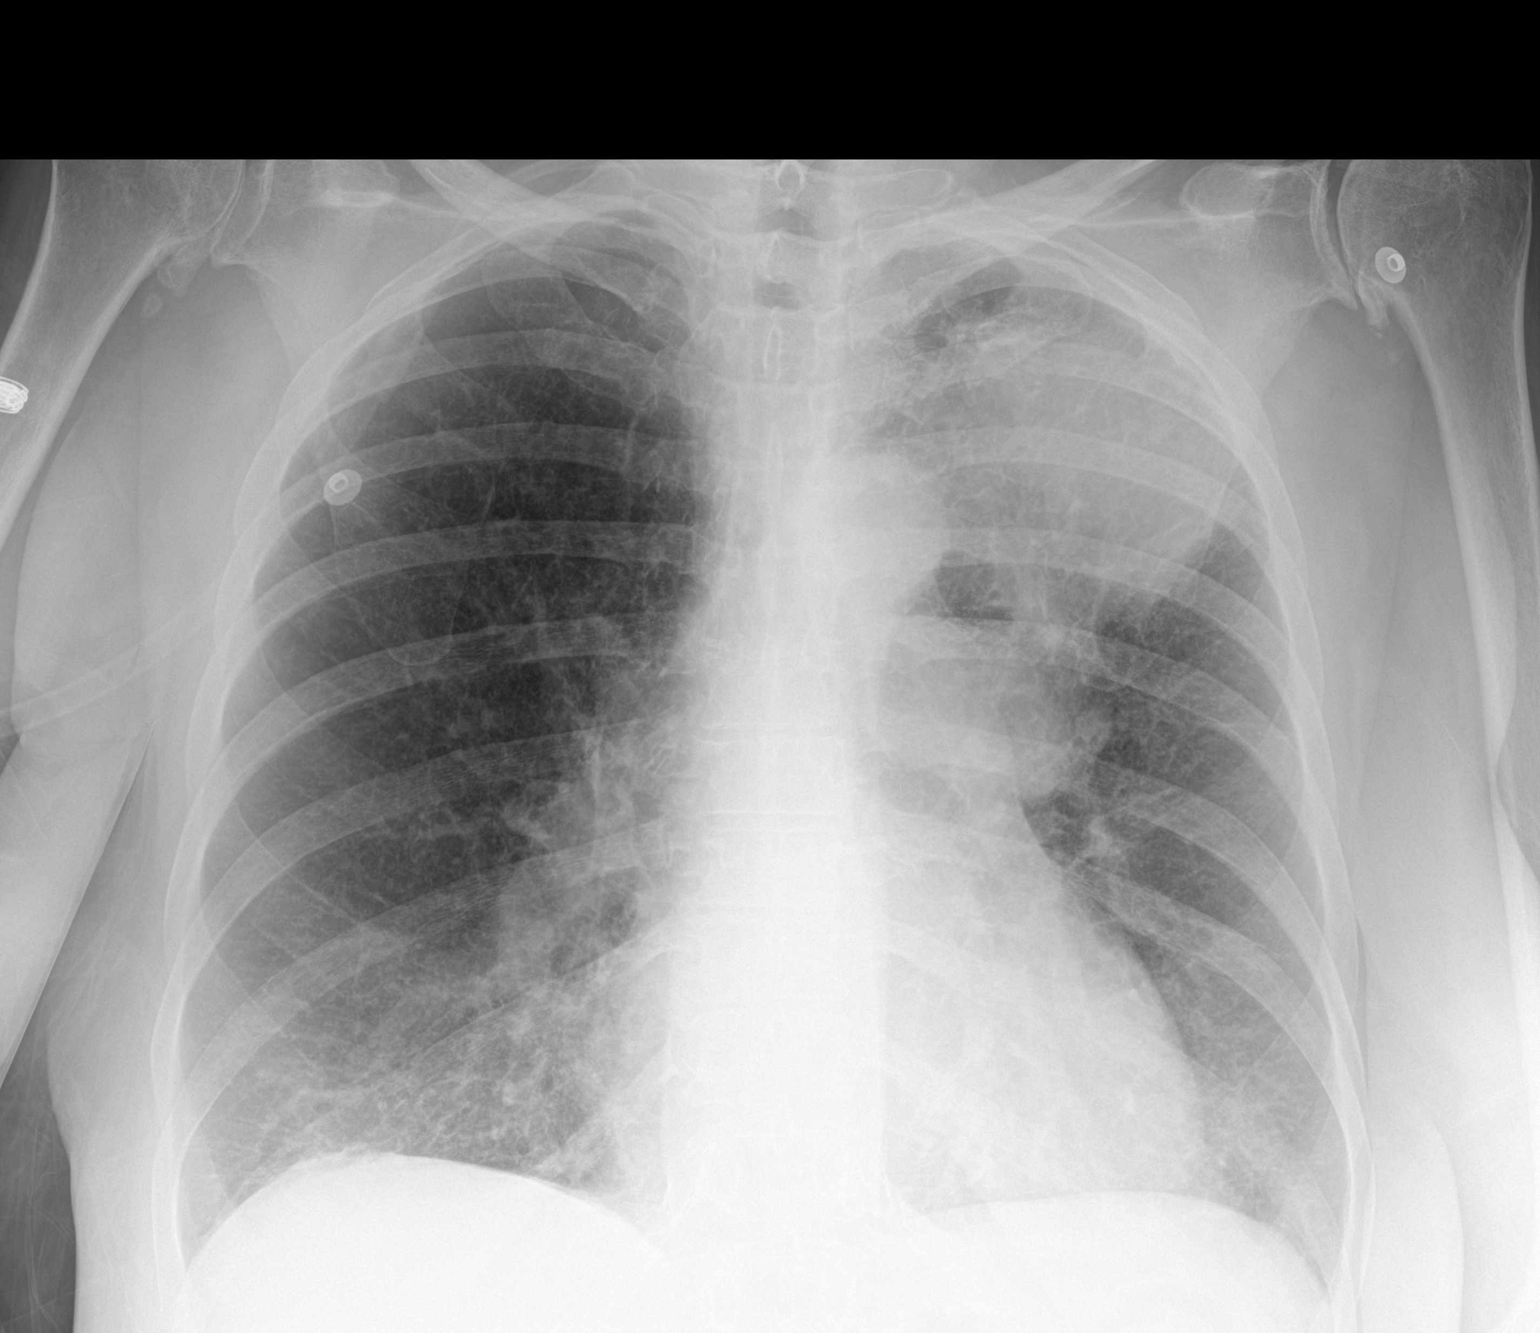

[1 of 1 positions shown; findings below may reference images not displayed]

FINDINGS: 8.9 cm left upper lobe mass redemonstrated with bone destruction of
the left second anterior rib. Large left perihilar mass likely
reflecting an enlarged lymph node. No pleural effusion. No
pneumothorax. Underlying bilateral emphysematous changes. Stable
heart size.

No acute osseous abnormality. Moderate-severe osteoarthritis of
bilateral glenohumeral joints.
IMPRESSION: 1. Pneumothorax status post left lung biopsy.
2. 8.9 cm left upper lobe mass redemonstrated with bone destruction
of the left second anterior rib. Large left perihilar mass likely
reflecting an enlarged lymph node.

## 2021-07-10 MED ORDER — FENTANYL CITRATE (PF) 100 MCG/2ML IJ SOLN
INTRAMUSCULAR | Status: AC
  Filled 2021-07-10: qty 2

## 2021-07-10 MED ORDER — SODIUM CHLORIDE 0.9 % IV SOLN: INTRAVENOUS | Status: DC

## 2021-07-10 MED ORDER — LIDOCAINE HCL 1 % IJ SOLN
INTRAMUSCULAR | Status: AC
  Filled 2021-07-10: qty 10

## 2021-07-10 MED ORDER — GELATIN ABSORBABLE 12-7 MM EX MISC
CUTANEOUS | Status: AC
  Filled 2021-07-10: qty 1

## 2021-07-10 MED ORDER — MIDAZOLAM HCL 2 MG/2ML IJ SOLN
INTRAMUSCULAR | Status: AC | PRN
  Administered 2021-07-10 (×3): 0.5 mg via INTRAVENOUS

## 2021-07-10 MED ORDER — MIDAZOLAM HCL 2 MG/2ML IJ SOLN
INTRAMUSCULAR | Status: AC
  Filled 2021-07-10: qty 2

## 2021-07-10 MED ORDER — LORAZEPAM 2 MG/ML IJ SOLN: INTRAMUSCULAR | Status: AC | PRN

## 2021-07-10 MED ORDER — FENTANYL CITRATE (PF) 100 MCG/2ML IJ SOLN
INTRAMUSCULAR | Status: AC | PRN
  Administered 2021-07-10 (×2): 25 ug via INTRAVENOUS

## 2021-07-10 NOTE — H&P (Signed)
Referring Physician(s): Waldo  Supervising Physician: Jacqulynn Cadet  Patient Status:  [facility]- Outpatient  Chief Complaint:  Bx Left lung upper lobe mass   Subjective:  Pt has hx of COPD, HTN, migraines and tobacco abuse. Pt PET scan 06/23/21 showed Left upper bronchogenic carcinoma with left suprahilar nodal metastasis and direct chest wall invasion.  Pt found to be lying in bed. She is pleasant, calm and cooperative. Pt states that she is here for left upper lung mass biopsy and was sent by [name]. She is in NAD.  Allergies: Dyazide [hydrochlorothiazide w-triamterene] and Zestoretic [lisinopril-hydrochlorothiazide]  Medications: Prior to Admission medications   Medication Sig Start Date End Date Taking? Authorizing Provider  acetaminophen (TYLENOL) 500 MG tablet Take 500 mg by mouth every 6 (six) hours as needed.   Yes [provider]  amitriptyline (ELAVIL) 10 MG tablet Take 10 mg by mouth daily.   Yes [provider]  amLODipine (NORVASC) 2.5 MG tablet Take 2.5 mg by mouth daily.   Yes [provider]  chlorthalidone (HYGROTON) 25 MG tablet Take 50 mg by mouth daily.    Yes [provider]  cholecalciferol (VITAMIN D3) 25 MCG (1000 UNIT) tablet Take 1,000 Units by mouth daily.   Yes [provider]  Magnesium 250 MG TABS Take 250 mg by mouth daily.   Yes [provider]  potassium chloride (MICRO-K) 10 MEQ CR capsule Take 10 mEq by mouth daily.   Yes [provider]  rosuvastatin (CRESTOR) 10 MG tablet Take 10 mg by mouth daily.   Yes [provider]  SYMBICORT 160-4.5 MCG/ACT inhaler TAKE 2 PUFFS BY MOUTH TWICE A DAY 01/24/21  Yes Byrum, Rose Fillers, MD  Tiotropium Bromide Monohydrate (SPIRIVA RESPIMAT) 2.5 MCG/ACT AERS Inhale 2 puffs into the lungs daily. 04/15/20  Yes Collene Gobble, MD  albuterol (VENTOLIN HFA) 108 (90 Base) MCG/ACT inhaler Inhale 2 puffs into the lungs every 4 (four) hours as  needed for wheezing or shortness of breath. Patient not taking: No sig reported 01/07/20   Collene Gobble, MD  albuterol (VENTOLIN HFA) 108 (90 Base) MCG/ACT inhaler Inhale 2 puffs into the lungs every 6 (six) hours as needed for wheezing or shortness of breath. Patient not taking: Reported on 01/25/2021 07/18/20   Collene Gobble, MD     Vital Signs: BP (!) 102/58   Pulse (!) 117   Temp (!) 96.5 F (35.8 C) (Oral)   Resp 16   Ht 5' 10" (1.778 m)   Wt 181 lb (82.1 kg)   SpO2 (S) 96% Comment: 6 L o2  BMI 25.97 kg/m   Physical Exam Constitutional:      Appearance: Normal appearance.  HENT:     Mouth/Throat:     Mouth: Mucous membranes are moist.     Pharynx: Oropharynx is clear.  Cardiovascular:     Rate and Rhythm: Regular rhythm. Tachycardia present.     Heart sounds: Normal heart sounds.  Pulmonary:     Effort: Pulmonary effort is normal. No respiratory distress.     Breath sounds: No wheezing, rhonchi or rales.     Comments: Pt O2-6L, home O2 6L also Abdominal:     General: Bowel sounds are normal.     Palpations: Abdomen is soft.     Tenderness: There is no abdominal tenderness. There is no guarding.  Skin:    General: Skin is warm and dry.  Neurological:     Mental Status: She  is alert and oriented to person, place, and time.  Psychiatric:        Mood and Affect: Mood normal.        Behavior: Behavior normal.        Thought Content: Thought content normal.        Judgment: Judgment normal.    Imaging: No results found.  Labs:  CBC: Recent Labs    07/10/21 0930  WBC 8.7  HGB 8.5*  HCT 29.8*  PLT 421*    COAGS: Recent Labs    07/10/21 0930  INR 1.1  APTT 37*    BMP: No results for input(s): NA, K, CL, CO2, GLUCOSE, BUN, CALCIUM, CREATININE, GFRNONAA, GFRAA in the last 8760 hours.  Invalid input(s): CMP  LIVER FUNCTION TESTS: No results for input(s): BILITOT, AST, ALT, ALKPHOS, PROT, ALBUMIN in the last 8760 hours.  Assessment and Plan: Pt  has hx of COPD, HTN, migraines and tobacco abuse. Pt PET scan 06/23/21 showed Left upper bronchogenic carcinoma with left suprahilar nodal metastasis and direct chest wall invasion. Pt found to be lying in bed. She is pleasant, calm and cooperative. Pt states that she is here for left upper lung mass biopsy and was sent by [name]. She is in NAD.  Labs today:  Hgb 8.5 APTT 37   Risks and benefits of CT guided lung nodule biopsy was discussed with the patient including, but not limited to bleeding, hemoptysis, respiratory failure requiring intubation, infection, pneumothorax requiring chest tube placement, stroke from air embolism or even death.  All of the patient's questions were answered and the patient is agreeable to proceed.  Consent signed and in chart.   Electronically Signed: Tyson Alias, NP 07/10/2021, 10:17 AM   I spent a total of 15 Minutes at the the patient's bedside AND on the patient's hospital floor or unit, greater than 50% of which was counseling/coordinating care for image guided left upper lung lobe mass biopsy.

## 2021-07-10 NOTE — Procedures (Signed)
Interventional Radiology Procedure Note  Procedure: CT guided biopsy of LUL mass.   Complications: No immediate  EBL:  None  Recommendations: - Bedrest until CXR cleared.   - Follow up 1 hr CXR pending   Signed,  Criselda Peaches, MD

## 2021-07-13 LAB — SURGICAL PATHOLOGY

## 2021-07-14 ENCOUNTER — Telehealth: Payer: Self-pay | Admitting: Internal Medicine

## 2021-07-14 ENCOUNTER — Telehealth: Payer: Self-pay | Admitting: Emergency Medicine

## 2021-07-14 ENCOUNTER — Encounter: Payer: Self-pay | Admitting: *Deleted

## 2021-07-14 DIAGNOSIS — R918 Other nonspecific abnormal finding of lung field: Secondary | ICD-10-CM

## 2021-07-14 DIAGNOSIS — C3490 Malignant neoplasm of unspecified part of unspecified bronchus or lung: Secondary | ICD-10-CM

## 2021-07-14 NOTE — Telephone Encounter (Signed)
Received a new pt referral from [name] for lung cancer. Ms. Patricia Sampson has been cld and scheduled to see Dr. Julien Nordmann on 8/24 at 2:15pm w/labs at 1:45pm. Pt aware to arrive 15 minutes early.

## 2021-07-14 NOTE — Progress Notes (Signed)
I received referral on Patricia Sampson today. I updated new patient coordinator to call and schedule her to be seen with Dr. Julien Nordmann on 8/24 with labs.

## 2021-07-14 NOTE — Telephone Encounter (Signed)
Called the patient to review lung bx results > shows adenoCA of the lung. No answer so left VM and will need to call her back. I will refer her Oncology

## 2021-07-17 ENCOUNTER — Encounter: Payer: Self-pay | Admitting: Emergency Medicine

## 2021-07-17 NOTE — Telephone Encounter (Signed)
Spoke to patient, who is returning a call from [name] to discuss bx results.   [name], please contact patient at [phone]

## 2021-07-17 NOTE — Telephone Encounter (Signed)
Patient is returning phone call. Patient phone number is [phone].

## 2021-07-17 NOTE — Telephone Encounter (Signed)
I reviewed the bx result with Patricia Sampson > shows adenoCA of the lung. She has an appt with Dr Julien Nordmann on Wednesday. She is having L chest wall pain and I suspect she will need XRT to that area.

## 2021-07-19 ENCOUNTER — Encounter: Payer: Self-pay | Admitting: *Deleted

## 2021-07-19 ENCOUNTER — Inpatient Hospital Stay: Payer: 59 | Attending: Internal Medicine | Admitting: Internal Medicine

## 2021-07-19 ENCOUNTER — Inpatient Hospital Stay: Payer: 59

## 2021-07-19 ENCOUNTER — Other Ambulatory Visit: Payer: Self-pay

## 2021-07-19 VITALS — BP 101/50 | HR 118 | Temp 97.5°F | Resp 18 | Ht 70.0 in | Wt 176.1 lb

## 2021-07-19 DIAGNOSIS — Z87891 Personal history of nicotine dependence: Secondary | ICD-10-CM | POA: Insufficient documentation

## 2021-07-19 DIAGNOSIS — G893 Neoplasm related pain (acute) (chronic): Secondary | ICD-10-CM | POA: Diagnosis not present

## 2021-07-19 DIAGNOSIS — R918 Other nonspecific abnormal finding of lung field: Secondary | ICD-10-CM

## 2021-07-19 DIAGNOSIS — C3412 Malignant neoplasm of upper lobe, left bronchus or lung: Secondary | ICD-10-CM | POA: Diagnosis present

## 2021-07-19 DIAGNOSIS — E785 Hyperlipidemia, unspecified: Secondary | ICD-10-CM | POA: Insufficient documentation

## 2021-07-19 DIAGNOSIS — Z5111 Encounter for antineoplastic chemotherapy: Secondary | ICD-10-CM | POA: Insufficient documentation

## 2021-07-19 DIAGNOSIS — Z7951 Long term (current) use of inhaled steroids: Secondary | ICD-10-CM

## 2021-07-19 DIAGNOSIS — C3490 Malignant neoplasm of unspecified part of unspecified bronchus or lung: Secondary | ICD-10-CM

## 2021-07-19 DIAGNOSIS — I119 Hypertensive heart disease without heart failure: Secondary | ICD-10-CM | POA: Diagnosis not present

## 2021-07-19 DIAGNOSIS — Z79899 Other long term (current) drug therapy: Secondary | ICD-10-CM | POA: Insufficient documentation

## 2021-07-19 DIAGNOSIS — C3492 Malignant neoplasm of unspecified part of left bronchus or lung: Secondary | ICD-10-CM | POA: Insufficient documentation

## 2021-07-19 LAB — CMP (CANCER CENTER ONLY)
ALT: <6 U/L (ref 0–44)
AST: 11 U/L — ABNORMAL LOW (ref 15–41)
Albumin: 2.6 g/dL — ABNORMAL LOW (ref 3.5–5.0)
Alkaline Phosphatase: 81 U/L (ref 38–126)
Anion gap: 13 (ref 5–15)
BUN: 19 mg/dL (ref 8–23)
CO2: 39 mmol/L — ABNORMAL HIGH (ref 22–32)
Calcium: 9.9 mg/dL (ref 8.9–10.3)
Chloride: 84 mmol/L — ABNORMAL LOW (ref 98–111)
Creatinine: 0.89 mg/dL (ref 0.44–1.00)
GFR, Estimated: >60 mL/min (ref >60)
Glucose, Bld: 135 mg/dL — ABNORMAL HIGH (ref 70–99)
Potassium: 3.5 mmol/L (ref 3.5–5.1)
Sodium: 136 mmol/L (ref 135–145)
Total Bilirubin: 0.4 mg/dL (ref 0.3–1.2)
Total Protein: 7.7 g/dL (ref 6.5–8.1)

## 2021-07-19 LAB — CBC WITH DIFFERENTIAL (CANCER CENTER ONLY)
Abs Immature Granulocytes: 0.03 10*3/uL (ref 0.00–0.07)
Basophils Absolute: 0 10*3/uL (ref 0.0–0.1)
Basophils Relative: 0 %
Eosinophils Absolute: 0.2 10*3/uL (ref 0.0–0.5)
Eosinophils Relative: 2 %
HCT: 26 % — ABNORMAL LOW (ref 36.0–46.0)
Hemoglobin: 7.3 g/dL — ABNORMAL LOW (ref 12.0–15.0)
Immature Granulocytes: 0 %
Lymphocytes Relative: 11 %
Lymphs Abs: 0.9 10*3/uL (ref 0.7–4.0)
MCH: 21.5 pg — ABNORMAL LOW (ref 26.0–34.0)
MCHC: 28.1 g/dL — ABNORMAL LOW (ref 30.0–36.0)
MCV: 76.7 fL — ABNORMAL LOW (ref 80.0–100.0)
Monocytes Absolute: 0.7 10*3/uL (ref 0.1–1.0)
Monocytes Relative: 8 %
Neutro Abs: 7.1 10*3/uL (ref 1.7–7.7)
Neutrophils Relative %: 79 %
Platelet Count: 332 10*3/uL (ref 150–400)
RBC: 3.39 MIL/uL — ABNORMAL LOW (ref 3.87–5.11)
RDW: 18 % — ABNORMAL HIGH (ref 11.5–15.5)
WBC Count: 8.9 10*3/uL (ref 4.0–10.5)
nRBC: 0 % (ref 0.0–0.2)

## 2021-07-19 MED ORDER — HYDROCODONE-ACETAMINOPHEN 5-325 MG PO TABS: 1.0000 | ORAL_TABLET | Freq: Four times a day (QID) | ORAL | 0 refills | Status: DC | PRN

## 2021-07-19 MED ORDER — PROCHLORPERAZINE MALEATE 10 MG PO TABS: 10.0000 mg | ORAL_TABLET | Freq: Four times a day (QID) | ORAL | 0 refills | Status: AC | PRN

## 2021-07-19 NOTE — Progress Notes (Signed)
Per Dr. Julien Nordmann, requested molecular and PDL 1 testing on Patricia Sampson recent bx.  Tissue to go to [facility].

## 2021-07-19 NOTE — Progress Notes (Signed)
[place] [phone]   [phone]  CONSULT NOTE  REFERRING PHYSICIAN: Dr. Baltazar Apo  REASON FOR CONSULTATION:  65 years old African-American female recently diagnosed with lung cancer.  HPI Patricia Sampson is a 65 y.o. female with past medical history significant for COPD, hypertension, dyslipidemia, seasonal allergy as well as long history of smoking but quit 20 years ago.  The patient mentions that she has been complaining of pain on the left side of the chest since November 2021.  She initially thought that she pulled a muscle and she tried to manage it with over-the-counter medication but there was no improvement in her symptoms.  She started having more wheezing and shortness of breath and she was seen by her primary care physician Dr. Burnett Sheng in [facility].  The patient had CT angiogram of the chest on April 28, 2021 and that showed large left upper lobe mass measuring 8.5 cm with left hilar lymphadenopathy and the mass had brought contact of the anterior pleural space and likely invades the pleura and the chest wall.  She was referred to [name] for further evaluation and she had a PET scan performed on June 23, 2021 and it showed hypermetabolic left upper lobe lung mass measuring 8.8 x 6.3 cm with SUV max of 37.1.  There was also progressive chest wall invasion including destruction of the anterior left second rib and hypermetabolic left suprahilar nodal metastasis measuring 3.7 x 2.3 cm with SUV max of 21.0.  There was no evidence of metastatic disease outside the chest.  On July 10, 2021 the patient underwent CT-guided core biopsy of the left upper lobe pleural-based pulmonary mass by interventional radiology. The final pathology ([id number]) was consistent with adenocarcinoma. By immunohistochemistry, the neoplastic cells are positive for TTF-1 but  negative for cytokeratin 5/6 and p40, supporting the diagnosis of adenocarcinoma.   Based on the biopsy, the adenocarcinoma appears moderately differentiated. [name] kindly referred the patient to me today for evaluation and recommendation regarding her condition.  When seen today she continues to complain of the left-sided chest pain and she is currently taking hydrocodone 1 tablet a day in addition to gabapentin.  She is requesting refill of her pain medication.  She also has shortness of breath with exertion and cough productive of orange sputum.  She lost around 40 pounds in the last 3 to 6 months.  She has no nausea, vomiting, diarrhea or constipation.  She denied having any headache but has blurry vision. Family history significant for mother with coronary artery disease, hypertension and COPD.  Father had diabetes mellitus.  Sister had uterine cancer. The patient is married and has 2 children a son and daughter.  She lives in [place].  She used to work at [employer].  She was accompanied today by her sister [name].  The patient has a history for smoking 1 pack/day for around 30 years and quit 20 years ago.  She has no history of alcohol or drug abuse.  HPI  Past Medical History:  Diagnosis Date   Angioedema    COPD (chronic obstructive pulmonary disease) (HCC)    FHx: migraine headaches    Hyperlipidemia    Hypertension    Seasonal allergies     No past surgical history on file.  Family History  Problem Relation Age of Onset   Diabetes Mellitus II Father    Coronary artery disease Mother    Hypertension Mother  COPD Mother     Social History Social History   Tobacco Use   Smoking status: Former    Packs/day: 1.00    Years: 30.00    Pack years: 30.00    Types: Cigarettes    Quit date: 11/26/2000    Years since quitting: 20.6   Smokeless tobacco: Never  Substance Use Topics   Alcohol use: No   Drug use: No    Allergies  Allergen Reactions   Dyazide [Hydrochlorothiazide W-Triamterene]     hives   Zestoretic  [Lisinopril-Hydrochlorothiazide]     Swelling     Current Outpatient Medications  Medication Sig Dispense Refill   acetaminophen (TYLENOL) 500 MG tablet Take 500 mg by mouth every 6 (six) hours as needed.     albuterol (VENTOLIN HFA) 108 (90 Base) MCG/ACT inhaler Inhale 2 puffs into the lungs every 4 (four) hours as needed for wheezing or shortness of breath. (Patient not taking: No sig reported) 18 g 5   albuterol (VENTOLIN HFA) 108 (90 Base) MCG/ACT inhaler Inhale 2 puffs into the lungs every 6 (six) hours as needed for wheezing or shortness of breath. (Patient not taking: Reported on 01/25/2021) 8 g 6   amitriptyline (ELAVIL) 10 MG tablet Take 10 mg by mouth daily.     amLODipine (NORVASC) 2.5 MG tablet Take 2.5 mg by mouth daily.     chlorthalidone (HYGROTON) 25 MG tablet Take 50 mg by mouth daily.      cholecalciferol (VITAMIN D3) 25 MCG (1000 UNIT) tablet Take 1,000 Units by mouth daily.     Magnesium 250 MG TABS Take 250 mg by mouth daily.     potassium chloride (MICRO-K) 10 MEQ CR capsule Take 10 mEq by mouth daily.     rosuvastatin (CRESTOR) 10 MG tablet Take 10 mg by mouth daily.     SYMBICORT 160-4.5 MCG/ACT inhaler TAKE 2 PUFFS BY MOUTH TWICE A DAY 10.2 each 5   Tiotropium Bromide Monohydrate (SPIRIVA RESPIMAT) 2.5 MCG/ACT AERS Inhale 2 puffs into the lungs daily. 4 g 5   No current facility-administered medications for this visit.    Review of Systems  Constitutional: positive for anorexia, fatigue, and weight loss Eyes: negative Ears, nose, mouth, throat, and face: negative Respiratory: positive for cough, dyspnea on exertion, pleurisy/chest pain, and sputum Cardiovascular: negative Gastrointestinal: negative Genitourinary:negative Integument/breast: negative Hematologic/lymphatic: negative Musculoskeletal:negative Neurological: negative Behavioral/Psych: negative Endocrine: negative Allergic/Immunologic: negative  Physical Exam  YXA:JLUNG, healthy, no distress,  well nourished, and well developed SKIN: skin color, texture, turgor are normal, no rashes or significant lesions HEAD: Normocephalic, No masses, lesions, tenderness or abnormalities EYES: normal, PERRLA, Conjunctiva are pink and non-injected EARS: External ears normal, Canals clear OROPHARYNX:no exudate, no erythema, and lips, buccal mucosa, and tongue normal  NECK: supple, no adenopathy, no JVD LYMPH:  no palpable lymphadenopathy, no hepatosplenomegaly BREAST:not examined LUNGS: clear to auscultation , and palpation HEART: regular rate & rhythm, no murmurs, and no gallops ABDOMEN:abdomen soft, non-tender, normal bowel sounds, and no masses or organomegaly BACK: No CVA tenderness, Range of motion is normal EXTREMITIES:no joint deformities, effusion, or inflammation, no edema  NEURO: alert & oriented x 3 with fluent speech, no focal motor/sensory deficits  PERFORMANCE STATUS: ECOG 1  LABORATORY DATA: Lab Results  Component Value Date   WBC 8.9 07/19/2021   HGB 7.3 (L) 07/19/2021   HCT 26.0 (L) 07/19/2021   MCV 76.7 (L) 07/19/2021   PLT 332 07/19/2021      Chemistry  Component Value Date/Time   NA 128 (L) 11/18/2019 1240   K 3.0 (L) 11/18/2019 1240   CL 76 (L) 11/18/2019 1240   CO2 44 (H) 11/18/2019 1240   BUN 19 11/18/2019 1240   CREATININE 0.87 11/18/2019 1240      Component Value Date/Time   CALCIUM 9.2 11/18/2019 1240   ALKPHOS 39 11/18/2019 1240   AST 48 (H) 11/18/2019 1240   ALT 22 11/18/2019 1240   BILITOT 0.5 11/18/2019 1240       RADIOGRAPHIC STUDIES: NM PET Image Initial (PI) Skull Base To Thigh  Result Date: 06/26/2021 CLINICAL DATA:  Initial treatment strategy for left upper lobe lung mass on chest CT. EXAM: NUCLEAR MEDICINE PET SKULL BASE TO THIGH TECHNIQUE: 11.8 mCi F-18 FDG was injected intravenously. Full-ring PET imaging was performed from the skull base to thigh after the radiotracer. CT data was obtained and used for attenuation correction and  anatomic localization. Fasting blood glucose: 124 mg/dl COMPARISON:  CTA chest 04/28/2021 and abdominopelvic CT 04/20/2021, for both from [facility]. FINDINGS: Mediastinal blood pool activity: SUV max 3.0 Liver activity: SUV max NA NECK: No areas of abnormal hypermetabolism. Incidental CT findings: No cervical adenopathy. CHEST: Hypermetabolic left upper lobe lung mass. This measures 8.8 x 6.3 cm and a S.U.V. max of 37.1 on 22/8. (Previously 8.4 x 6.1 cm.) Demonstrates progressive chest wall invasion including destruction of the anterior left second rib on 16 and 20 of series 8. Hypermetabolic left suprahilar nodal metastasis at 3.7 x 2.3 cm and a S.U.V. max of 21.0 on 52/4. Similar in size to on the prior. Incidental CT findings: No pleural fluid. Mild cardiomegaly. Aortic atherosclerosis. Moderate bullous emphysema. ABDOMEN/PELVIS: No abdominopelvic parenchymal or nodal hypermetabolism. Incidental CT findings: Normal adrenal glands. Abdominal aortic atherosclerosis. Degraded evaluation of the pelvis, secondary to beam hardening artifact from bilateral hip arthroplasty. Large colonic stool burden. SKELETON: No abnormal marrow activity. Incidental CT findings: none IMPRESSION: 1. 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 Presuming non-small-cell histology, T4N1M0 or stage IIIA. 2. Incidental findings, including: Aortic atherosclerosis (ICD10-I70.0), coronary artery atherosclerosis and emphysema (ICD10-J43.9). Electronically Signed   By: Abigail Miyamoto M.D.   On: 06/26/2021 11:57   DG Chest Port 1 View  Result Date: 07/10/2021 CLINICAL DATA:  Status post lung biopsy. EXAM: PORTABLE CHEST 1 VIEW COMPARISON:  CT chest 04/28/2021 FINDINGS: 8.9 cm left upper lobe mass redemonstrated with bone destruction of the left second anterior rib. Large left perihilar mass likely reflecting an enlarged lymph node. No pleural effusion. No  pneumothorax. Underlying bilateral emphysematous changes. Stable heart size. No acute osseous abnormality. 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 IMPRESSION: 1. Pneumothorax status post left lung biopsy. 2. 8.9 cm left upper lobe mass redemonstrated with bone destruction of the left second anterior rib. Large left perihilar mass likely reflecting an enlarged lymph node. Electronically Signed   By: Kathreen Devoid M.D.   On: 07/10/2021 14:14   CT LUNG MASS BIOPSY  Result Date: 07/10/2021 INDICATION: 65 year old female with left upper lobe pleural based pulmonary mass with invasion of the overlying ribs. She presents for CT-guided core biopsy of the same. EXAM: CT-guided biopsy left lobe pulmonary mass Interventional Radiologist:  Criselda Peaches, MD MEDICATIONS: None. ANESTHESIA/SEDATION: Fentanyl 50 mcg IV; Versed 1.5 mg IV Moderate Sedation Time:  15 minutes The patient was continuously monitored during the procedure by the interventional radiology nurse under my direct supervision. FLUOROSCOPY TIME:  None.  COMPLICATIONS: None immediate. Estimated blood loss:  0 PROCEDURE: Informed written consent was obtained from the patient after a thorough discussion of the procedural risks, benefits and alternatives. All questions were addressed. Maximal Sterile Barrier Technique was utilized including caps, mask, sterile gowns, sterile gloves, sterile drape, hand hygiene and skin antiseptic. A timeout was performed prior to the initiation of the procedure. A planning axial CT scan was performed. The mass in the left upper lobe was successfully identified. A suitable skin entry site was selected and marked. The region was then sterilely prepped and draped in standard fashion using Betadine skin prep. Local anesthesia was attained by infiltration with 1% lidocaine. A small dermatotomy was made. Under intermittent CT fluoroscopic guidance, a 17 gauge trocar needle was advanced into the lung and  positioned at the margin of the nodule. Multiple 18 gauge core biopsies were then coaxially obtained using the BioPince automated biopsy device. Biopsy specimens were placed in formalin and delivered to pathology for further analysis. The biopsy device and introducer needle were removed. Post biopsy axial CT imaging demonstrates no evidence of immediate complication. There is no pneumothorax. The patient tolerated the procedure well. IMPRESSION: Technically successful CT-guided biopsy left upper lobe pulmonary mass. Electronically Signed   By: Jacqulynn Cadet M.D.   On: 07/10/2021 13:40    ASSESSMENT: This is a very pleasant 65 years old African-American female recently diagnosed with a stage IIIA (T4, N1, MX) non-small cell lung cancer, adenocarcinoma presented with large left upper lobe lung mass with chest wall invasion in addition to left suprahilar lymphadenopathy diagnosed in August 2022, pending further staging work-up.   PLAN: I had a lengthy discussion with the patient and her sister today about her current disease stage, prognosis and treatment options. I personally and independently reviewed the scan images and discussed the result and showed the images to the patient and her sister. I recommended for her to complete the staging work-up by ordering MRI of the brain to rule out brain metastasis. I will also send the tissue block to be tested for molecular mutation as well as PD-L1 expression. I discussed with the patient her treatment options and explained to her that she is not currently a good surgical candidate because of her stage of the disease as well as the respiratory status with COPD and oxygen requirement. I recommended for the patient a course of concurrent chemoradiation with weekly carboplatin for AUC of 2 and paclitaxel 45 Mg/M2 for 6-7 weeks.  This will be followed by consolidation immunotherapy with Imfinzi if the patient has no evidence for disease progression after the  induction phase. The patient is interested in the treatment.  I discussed with her the adverse effect of this treatment including but not limited to alopecia, myelosuppression, nausea and vomiting, peripheral neuropathy, liver or renal dysfunction. I will refer the patient to radiation oncology for evaluation and discussion of the radiotherapy option. She is expected to start the first cycle of this treatment on the first week of September 2022. For pain management she will continue her current treatment with gabapentin and hydrocodone.  I will give her a refill of hydrocodone. I will also provide the patient with prescription for Compazine 10 mg p.o. every 6 hours as needed for nausea. She will come back for follow-up visit 1 week after the start of her treatment for management of any adverse effect. The patient will have a chemotherapy education class before the first dose of her treatment. She was advised to call immediately  if she has any other concerning symptoms in the interval. The patient voices understanding of current disease status and treatment options and is in agreement with the current care plan.  All questions were answered. The patient knows to call the clinic with any problems, questions or concerns. We can certainly see the patient much sooner if necessary.  Thank you so much for allowing me to participate in the care of Patricia Sampson. I will continue to follow up the patient with you and assist in her care.  The total time spent in the appointment was 90 minutes.  Disclaimer: This note was dictated with voice recognition software. Similar sounding words can inadvertently be transcribed and may not be corrected upon review.   Eilleen Kempf July 19, 2021, 2:07 PM

## 2021-07-19 NOTE — Progress Notes (Signed)
START ON PATHWAY REGIMEN - Non-Small Cell Lung     Administer weekly:     Paclitaxel      Carboplatin   **Always confirm dose/schedule in your pharmacy ordering system**  Patient Characteristics: Preoperative or Nonsurgical Candidate (Clinical Staging), Stage III - Nonsurgical Candidate (Nonsquamous and Squamous), PS = 0, 1 Therapeutic Status: Preoperative or Nonsurgical Candidate (Clinical Staging) AJCC T Category: cT4 AJCC N Category: cN1 AJCC M Category: cM0 AJCC 8 Stage Grouping: IIIA ECOG Performance Status: 1 Intent of Therapy: Curative Intent, Discussed with Patient

## 2021-07-20 ENCOUNTER — Other Ambulatory Visit: Payer: Self-pay | Admitting: *Deleted

## 2021-07-20 NOTE — Progress Notes (Signed)
The proposed discussed in cancer conference is for discussion purpose only and is not a binding recommendations.  The patient was not physically examined nor present for their treatment options.  Therefore, final treatment plans cannot be decided.

## 2021-07-21 ENCOUNTER — Telehealth: Payer: Self-pay | Admitting: *Deleted

## 2021-07-21 NOTE — Telephone Encounter (Signed)
Message left for [facility] for prior authorization obtained for Hydrocodone-APAP 5-325 Mg tablets per [employer] effective 07/21/2021 through 01/17/2022 for process upon re-opening after lunch at 2:00 pm today.Marland Kitchen

## 2021-07-24 ENCOUNTER — Encounter: Payer: Self-pay | Admitting: *Deleted

## 2021-07-24 NOTE — Progress Notes (Signed)
I followed up on Patricia Sampson schedule.  I did not see chemo set up yet so I reached out to scheduling.  They are working on getting her set up the week of 9/5.

## 2021-07-24 NOTE — Progress Notes (Signed)
Thoracic Location of Tumor / Histology: [facility] Lung   Patient presented to her PCP with complaints of left side chest pain that would not go away.  She noticed the chest pains in November 2021.  She initially thought she pulled a muscle and tried to manage it with over the counter medications.  She noticed more wheezing and SOB and was seen by her PCP.  MRI Brain 07/26/2021:  PET 6/72/0947: Hypermetabolic left upper lobe lung mass measuring 8.8 x 6.3 cm with SUV max of 37.1.  There was also progressive chest wall invasion including destruction of the anterior left second rib and hypermetabolic left suprahilar nodal metastasis measuring 3.7 x 2.3 cm with SUV max of 21.0.  There was no evidence of metastatic disease outside the chest.  CTA 04/28/2021: Large left upper lobe mass measuring 8.5 cm with left hilar lymphadenopathy and the mass had brought contact of he anterior pleural space and likely invades the pleura and the chest wall.  Biopsies of Left Upper Lobe Lung Mass 07/10/2021    Tobacco/Marijuana/Snuff/ETOH use: Former smoker, quit 20 years ago.  Past/Anticipated interventions by cardiothoracic surgery, if any:    Past/Anticipated interventions by medical oncology, if any:  Dr. Julien Nordmann 07/19/2021 -I recommended for her to complete the staging work-up by ordering MRI of the brain to rule out brain metastasis. I will also send the tissue block to be tested for molecular mutation as well as PD-L1 expression. -I discussed with the patient her treatment options and explained to her that she is not currently a good surgical candidate because of her stage of the disease as well as the respiratory status with COPD and oxygen requirement. -I recommended for the patient a course of concurrent chemoradiation with weekly carboplatin for AUC of 2 and paclitaxel 45 Mg/M2 for 6-7 weeks.  This will be followed by consolidation immunotherapy with Imfinzi if the patient has no evidence for disease progression  after the induction phase. -I will refer the patient to radiation oncology for evaluation and discussion of the radiotherapy option. -She is expected to start the first cycle of this treatment on the first week of September 2022.   Signs/Symptoms Weight changes, if any: Lost about 40 pounds. Respiratory complaints, if any: Wears oxygen, 6-8 liters Hemoptysis, if any: Has productive cough, notes some hemoptysis that is intermittent.  Less than tablespoon amount each time.  Notes sputum is mostly brown/beige in color. Pain issues, if any:    SAFETY ISSUES: Prior radiation? None Pacemaker/ICD?  No Possible current pregnancy? Postmenopausal Is the patient on methotrexate? No  Current Complaints / other details:

## 2021-07-25 ENCOUNTER — Ambulatory Visit
Admission: RE | Admit: 2021-07-25 | Discharge: 2021-07-25 | Disposition: A | Discharge status: Home / Self Care | Payer: 59 | Source: Ambulatory Visit | Attending: Radiation Oncology | Admitting: Radiation Oncology

## 2021-07-25 ENCOUNTER — Encounter: Payer: Self-pay | Admitting: Radiation Oncology

## 2021-07-25 ENCOUNTER — Other Ambulatory Visit: Payer: Self-pay

## 2021-07-25 VITALS — Ht 70.0 in | Wt 176.0 lb

## 2021-07-25 DIAGNOSIS — C3492 Malignant neoplasm of unspecified part of left bronchus or lung: Secondary | ICD-10-CM

## 2021-07-25 MED ORDER — NYSTATIN 100000 UNIT/ML MT SUSP: 5.0000 mL | Freq: Four times a day (QID) | OROMUCOSAL | 0 refills | Status: AC

## 2021-07-25 NOTE — Progress Notes (Signed)
Radiation Oncology         ([phone]) [phone] ________________________________  Name: Patricia Sampson        MRN: [id number]  Date of Service: 07/25/2021 DOB: 08-27-1956  WG:[id number], Patricia Hauser, MD  Curt Bears, MD     REFERRING PHYSICIAN: Curt Bears, MD   DIAGNOSIS: The encounter diagnosis was Adenocarcinoma of left lung, stage 3 ([place]).   HISTORY OF PRESENT ILLNESS: Patricia Sampson is a 65 y.o. female seen at the request of Dr. Julien Nordmann for newly diagnosed stage III lung cancer.  Patient had been experiencing left-sided chest pain November of last year.  She tried managing her symptoms with over-the-counter medication thinking that there was a musculoskeletal component to her symptoms.  She developed progressive pulmonary symptoms of wheezing and shortness of breath. Her PCP recommended imaging and this was performed at an outside facility, it appears that there was a mass in the lung and [name] was contacted, he recommended pet imaging and on 06/23/2021 this revealed a 8.8 x 6.3 cm mass in the left upper lobe with hypermetabolism of 37.1 chest wall invasion including destruction of the left second rib was seen and hypermetabolism of the left suprahilar lymph node region measuring up to 3.7 had an SUV of 21.  No other evidence of distant disease was identified, she is scheduled to undergo an MRI of the brain tomorrow.  She underwent a CT-guided lung biopsy on 07/10/2021 which revealed adenocarcinoma.  She has met with Dr. Julien Nordmann and has plans for systemic treatment with chemotherapy and to begin on 08/14/2021.  She is seen today via MyChart to discuss options of radiotherapy concurrently with chemotherapy.    PREVIOUS RADIATION THERAPY: No   PAST MEDICAL HISTORY:  Past Medical History:  Diagnosis Date   Angioedema    COPD (chronic obstructive pulmonary disease) (HCC)    FHx: migraine headaches    Hyperlipidemia    Hypertension    Seasonal allergies        PAST SURGICAL  HISTORY:No past surgical history on file.   FAMILY HISTORY:  Family History  Problem Relation Age of Onset   Diabetes Mellitus II Father    Coronary artery disease Mother    Hypertension Mother    COPD Mother      SOCIAL HISTORY:  reports that she quit smoking about 20 years ago. Her smoking use included cigarettes. She has a 30.00 pack-year smoking history. She has never used smokeless tobacco. She reports that she does not drink alcohol and does not use drugs.   ALLERGIES: Dyazide [hydrochlorothiazide w-triamterene] and Zestoretic [lisinopril-hydrochlorothiazide]   MEDICATIONS:  Current Outpatient Medications  Medication Sig Dispense Refill   albuterol (VENTOLIN HFA) 108 (90 Base) MCG/ACT inhaler Inhale 2 puffs into the lungs every 4 (four) hours as needed for wheezing or shortness of breath. (Patient not taking: No sig reported) 18 g 5   albuterol (VENTOLIN HFA) 108 (90 Base) MCG/ACT inhaler Inhale 2 puffs into the lungs every 6 (six) hours as needed for wheezing or shortness of breath. (Patient not taking: Reported on 01/25/2021) 8 g 6   amitriptyline (ELAVIL) 10 MG tablet Take 10 mg by mouth daily.     amLODipine (NORVASC) 2.5 MG tablet Take 2.5 mg by mouth daily.     chlorthalidone (HYGROTON) 25 MG tablet Take 50 mg by mouth daily.      cholecalciferol (VITAMIN D3) 25 MCG (1000 UNIT) tablet Take 1,000 Units by mouth daily.     furosemide (LASIX) 20 MG tablet  Take 20 mg by mouth daily.     HYDROcodone-acetaminophen (NORCO/VICODIN) 5-325 MG tablet Take 1 tablet by mouth every 6 (six) hours as needed. 30 tablet 0   Magnesium 250 MG TABS Take 250 mg by mouth daily.     potassium chloride (MICRO-K) 10 MEQ CR capsule Take 10 mEq by mouth daily.     prochlorperazine (COMPAZINE) 10 MG tablet Take 1 tablet (10 mg total) by mouth every 6 (six) hours as needed for nausea or vomiting. 30 tablet 0   rosuvastatin (CRESTOR) 10 MG tablet Take 10 mg by mouth daily.     SYMBICORT 160-4.5 MCG/ACT  inhaler TAKE 2 PUFFS BY MOUTH TWICE A DAY 10.2 each 5   Tiotropium Bromide Monohydrate (SPIRIVA RESPIMAT) 2.5 MCG/ACT AERS Inhale 2 puffs into the lungs daily. 4 g 5   No current facility-administered medications for this encounter.     REVIEW OF SYSTEMS: On review of systems, the patient reports that she is doing okay. She's joined today by her son Software engineer. She reports she has had left sided chest pain off and on for about 10 months and occasionally has wheezing and this is due to having a difficulty coughing up phelgm. She occasionally has seen blood tinged sputum but no more than that for several months. She has been O2 dependant between 6-8 L for more than 6 years. She has also noted a burning of her tongue for several days, and is using her inhaler per pulmonary. She reports that she has lost 40 pounds unintentionally as well. She denies headaches, visual or auditory disturbances, nausea, or confusion. No other complaints are noted.      PHYSICAL EXAM:  Unable to assess due to encounter type.  In general this is a well appearing African American female in no acute distress. She's alert and oriented x4 and appropriate throughout the examination. Cardiopulmonary assessment is negative for acute distress and she exhibits normal effort. She has white plaquing of her tongue consistent with candida.    ECOG = 1  0 - Asymptomatic (Fully active, able to carry on all predisease activities without restriction)  1 - Symptomatic but completely ambulatory (Restricted in physically strenuous activity but ambulatory and able to carry out work of a light or sedentary nature. For example, light housework, office work)  2 - Symptomatic, <50% in bed during the day (Ambulatory and capable of all self care but unable to carry out any work activities. Up and about more than 50% of waking hours)  3 - Symptomatic, >50% in bed, but not bedbound (Capable of only limited self-care, confined to bed or chair 50%  or more of waking hours)  4 - Bedbound (Completely disabled. Cannot carry on any self-care. Totally confined to bed or chair)  5 - Death   Patricia Sampson, Patricia Sampson, Patricia Sampson, et al. [phone]). "Toxicity and response criteria of the [facility]". [place] Oncol. 5 (6): 649-55    LABORATORY DATA:  Lab Results  Component Value Date   WBC 8.9 07/19/2021   HGB 7.3 (L) 07/19/2021   HCT 26.0 (L) 07/19/2021   MCV 76.7 (L) 07/19/2021   PLT 332 07/19/2021   Lab Results  Component Value Date   NA 136 07/19/2021   K 3.5 07/19/2021   CL 84 (L) 07/19/2021   CO2 39 (H) 07/19/2021   Lab Results  Component Value Date   ALT <6 07/19/2021   AST 11 (L) 07/19/2021   ALKPHOS 81 07/19/2021  BILITOT 0.4 07/19/2021      RADIOGRAPHY: DG Chest Port 1 View  Result Date: 07/10/2021 CLINICAL DATA:  Status post lung biopsy. EXAM: PORTABLE CHEST 1 VIEW COMPARISON:  CT chest 04/28/2021 FINDINGS: 8.9 cm left upper lobe mass redemonstrated with bone destruction of the left second anterior rib. Large left perihilar mass likely reflecting an enlarged lymph node. No pleural effusion. No pneumothorax. Underlying bilateral emphysematous changes. Stable heart size. No acute osseous abnormality. 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 IMPRESSION: 1. Pneumothorax status post left lung biopsy. 2. 8.9 cm left upper lobe mass redemonstrated with bone destruction of the left second anterior rib. Large left perihilar mass likely reflecting an enlarged lymph node. Electronically Signed   By: Kathreen Devoid M.D.   On: 07/10/2021 14:14   CT LUNG MASS BIOPSY  Result Date: 07/10/2021 INDICATION: 65 year old female with left upper lobe pleural based pulmonary mass with invasion of the overlying ribs. She presents for CT-guided core biopsy of the same. EXAM: CT-guided biopsy left lobe pulmonary mass Interventional Radiologist:  Patricia Peaches, MD MEDICATIONS: None.  ANESTHESIA/SEDATION: Fentanyl 50 mcg IV; Versed 1.5 mg IV Moderate Sedation Time:  15 minutes The patient was continuously monitored during the procedure by the interventional radiology nurse under my direct supervision. FLUOROSCOPY TIME:  None. COMPLICATIONS: None immediate. Estimated blood loss:  0 PROCEDURE: Informed written consent was obtained from the patient after a thorough discussion of the procedural risks, benefits and alternatives. All questions were addressed. Maximal Sterile Barrier Technique was utilized including caps, mask, sterile gowns, sterile gloves, sterile drape, hand hygiene and skin antiseptic. A timeout was performed prior to the initiation of the procedure. A planning axial CT scan was performed. The mass in the left upper lobe was successfully identified. A suitable skin entry site was selected and marked. The region was then sterilely prepped and draped in standard fashion using Betadine skin prep. Local anesthesia was attained by infiltration with 1% lidocaine. A small dermatotomy was made. Under intermittent CT fluoroscopic guidance, a 17 gauge trocar needle was advanced into the lung and positioned at the margin of the nodule. Multiple 18 gauge core biopsies were then coaxially obtained using the BioPince automated biopsy device. Biopsy specimens were placed in formalin and delivered to pathology for further analysis. The biopsy device and introducer needle were removed. Post biopsy axial CT imaging demonstrates no evidence of immediate complication. There is no pneumothorax. The patient tolerated the procedure well. IMPRESSION: Technically successful CT-guided biopsy left upper lobe pulmonary mass. Electronically Signed   By: Jacqulynn Cadet M.D.   On: 07/10/2021 13:40       IMPRESSION/PLAN: 1. Stage IIIA, cT4N1M0, NSCLC, adenocarcinoma of the LUL. Dr. Lisbeth Renshaw discusses the pathology findings and reviews the nature of locally advanced lung cancer. We discussed the need to proceed  with staging MRI of the brain which she is scheduled for tomorrow.  We discussed the risks, benefits, short, and long term effects of radiotherapy, as well as the curative intent, and the patient is interested in proceeding. Dr. Lisbeth Renshaw discusses the delivery and logistics of radiotherapy and anticipates a course of 6 1/2 weeks of concurrent chemoRT. She is scheduled to start on 08/14/21 with Dr. Julien Nordmann and we will contact her to coordinate simulation.  2. Oral thrush. I will send in nystatin suspension and have gone over the use of this with the patient. She will notify us if her symptoms haven't improved.  This encounter was provided by telemedicine platform Mychart.  The patient  has provided two factor identification and has given verbal consent for this type of encounter and has been advised to only accept a meeting of this type in a secure network environment. The time spent during this encounter was 60 minutes including preparation, discussion, and coordination of the patient's care. The attendants for this meeting include Blenda Nicely, RN, Dr. Lisbeth Renshaw, Hayden Pedro  and Camie Patience and her son Patricia Sampson. During the encounter,  Blenda Nicely, RN, Dr. Lisbeth Renshaw, and Hayden Pedro were located at [facility] Radiation Oncology Department.  Patricia Sampson was located at home with her son Patricia Sampson.    The above documentation reflects my direct findings during this shared patient visit. Please see the separate note by Dr. Lisbeth Renshaw on this date for the remainder of the patient's plan of care.    Carola Rhine, [facility]   **Disclaimer: This note was dictated with voice recognition software. Similar sounding words can inadvertently be transcribed and this note may contain transcription errors which may not have been corrected upon publication of note.**

## 2021-07-26 ENCOUNTER — Telehealth: Payer: Self-pay | Admitting: *Deleted

## 2021-07-26 ENCOUNTER — Inpatient Hospital Stay: Payer: 59

## 2021-07-26 ENCOUNTER — Ambulatory Visit (HOSPITAL_COMMUNITY)
Admission: RE | Admit: 2021-07-26 | Discharge: 2021-07-26 | Disposition: A | Discharge status: Home / Self Care | Payer: 59 | Source: Ambulatory Visit | Attending: Internal Medicine | Admitting: Internal Medicine

## 2021-07-26 DIAGNOSIS — C3490 Malignant neoplasm of unspecified part of unspecified bronchus or lung: Secondary | ICD-10-CM | POA: Insufficient documentation

## 2021-07-26 IMAGING — MR MR HEAD WO/W CM
13 series · 48 of 48 positions shown · IV contrast (gadavist)
Comparison: PET-CT [DATE].

CLINICAL DATA: Malignant neoplasm of unspecified part of
unspecified bronchus or lung (HCC) [TN] ([TN]-CM).
Non-small-cell lung cancer, staging.

EXAM:
MRI HEAD WITHOUT AND WITH CONTRAST
TECHNIQUE: Multiplanar, multiecho pulse sequences of the brain and surrounding
structures were obtained without and with intravenous contrast.
CONTRAST:  8mL GADAVIST GADOBUTROL 1 MMOL/ML IV SOLN

[Series 5: DWI · axial · 3.0mm · 1.36mm/px · z∈[-39,+113]mm · 6 of 104 slices shown (1 of 2)]
[im 1/104]
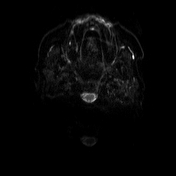
[im 21/104]
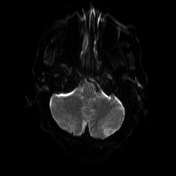
[im 42/104]
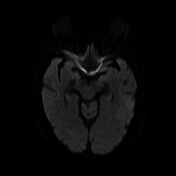
[im 62/104]
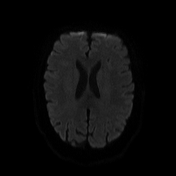
[im 83/104]
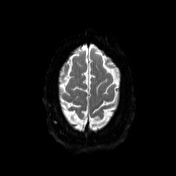
[im 104/104]
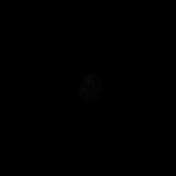

[Series 6: DWI · axial · 3.0mm · 1.36mm/px · z∈[-39,+113]mm · 3 of 52 slices shown (2 of 2)]
[im 1/52]
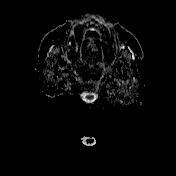
[im 26/52]
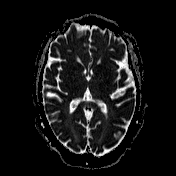
[im 52/52]
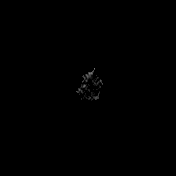

[Series 7: T1 · sagittal · 5.0mm · 0.75mm/px · 1 of 22 slices shown (1 of 2)]
[im 1/22]
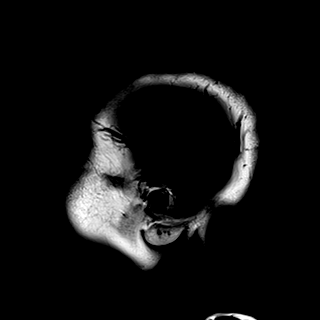

[Series 8: T2 · axial · 5.0mm · 0.62mm/px · z∈[-45,+117]mm · 2 of 26 slices shown]
[im 1/26]
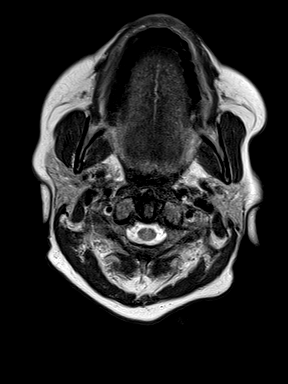
[im 26/26]
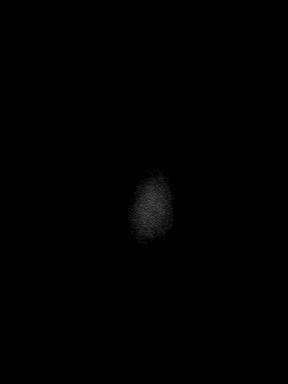

[Series 9: swi_images · axial · 3.0mm · 0.75mm/px · z∈[-40,+112]mm · 3 of 52 slices shown]
[im 1/52]
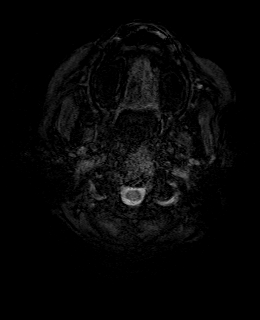
[im 26/52]
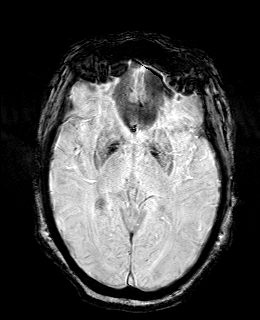
[im 52/52]
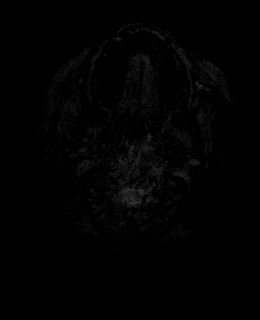

[Series 11: FLAIR · axial · 3.0mm · 0.75mm/px · z∈[-41,+114]mm · 3 of 53 slices shown]
[im 1/53]
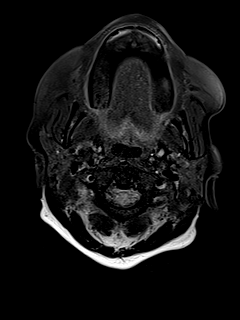
[im 27/53]
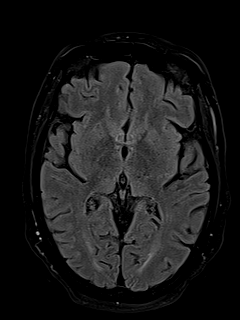
[im 53/53]
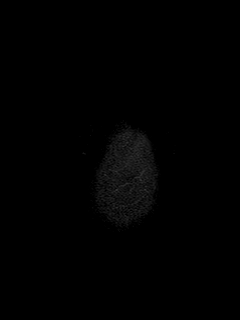

[Series 12: T1 · axial · 1.0mm · 0.94mm/px · z∈[-49,+110]mm · 10 of 160 slices shown (2 of 2)]
[im 1/160]
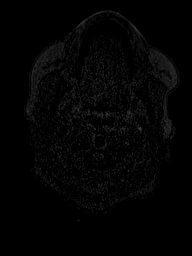
[im 18/160]
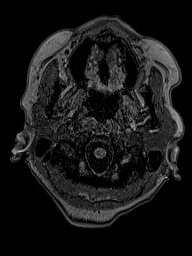
[im 36/160]
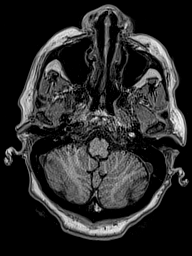
[im 54/160]
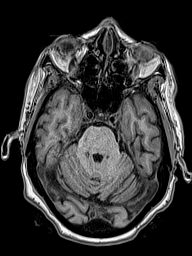
[im 71/160]
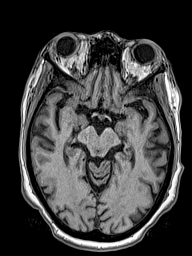
[im 89/160]
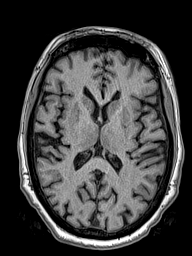
[im 107/160]
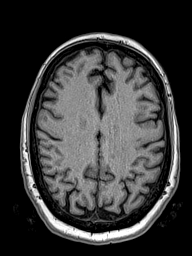
[im 124/160]
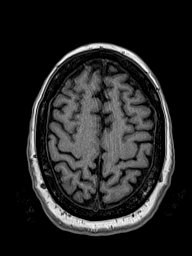
[im 142/160]
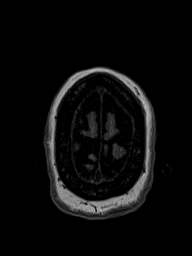
[im 160/160]
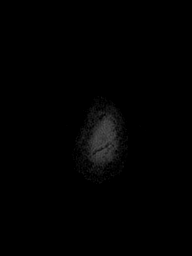

[Series 13: cor dwi_tracew · coronal · 5.0mm · 1.53mm/px · 3 of 56 slices shown]
[im 1/56]
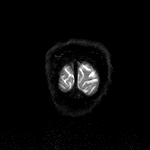
[im 28/56]
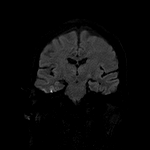
[im 56/56]
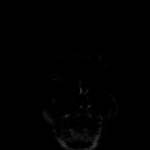

[Series 14: cor dwi_adc · coronal · 5.0mm · 1.53mm/px · 2 of 28 slices shown]
[im 1/28]
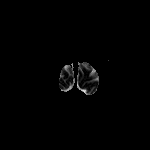
[im 28/28]
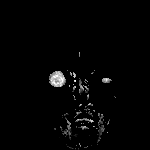

[Series 15: T2 post-contrast · coronal · 5.0mm · 0.57mm/px · 2 of 28 slices shown]
[im 1/28]
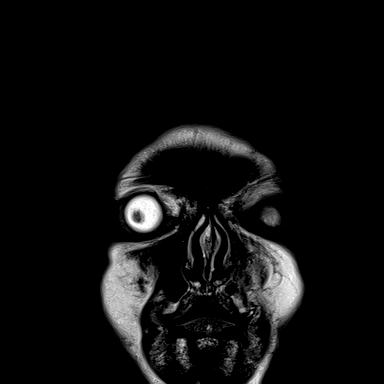
[im 28/28]
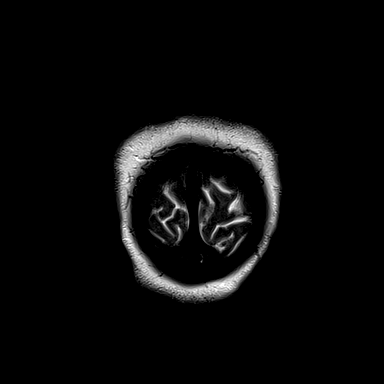

[Series 16: T1 post-contrast · axial · 1.0mm · 0.94mm/px · z∈[-49,+110]mm · 10 of 160 slices shown (1 of 3)]
[im 1/160]
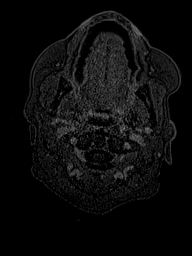
[im 18/160]
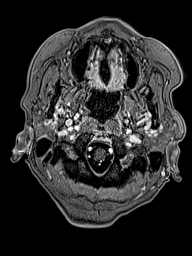
[im 36/160]
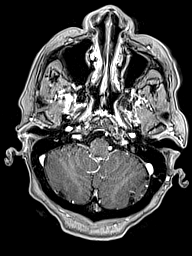
[im 54/160]
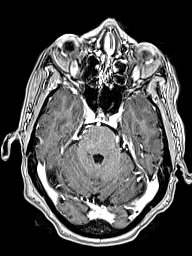
[im 71/160]
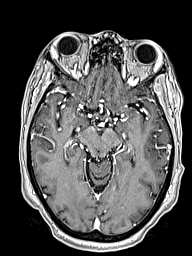
[im 89/160]
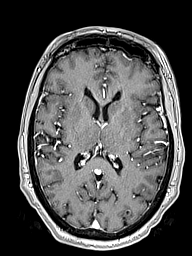
[im 107/160]
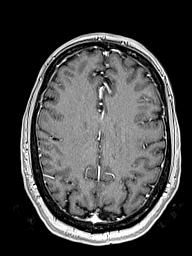
[im 124/160]
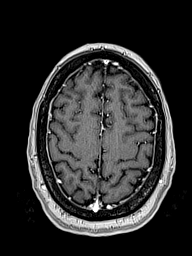
[im 142/160]
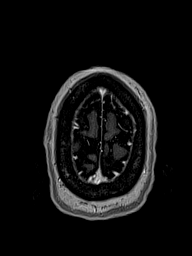
[im 160/160]
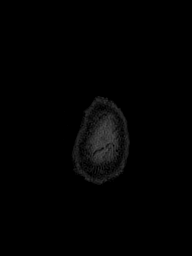

[Series 17: T1 post-contrast · coronal · 5.0mm · 0.43mm/px · 2 of 28 slices shown (2 of 3)]
[im 1/28]
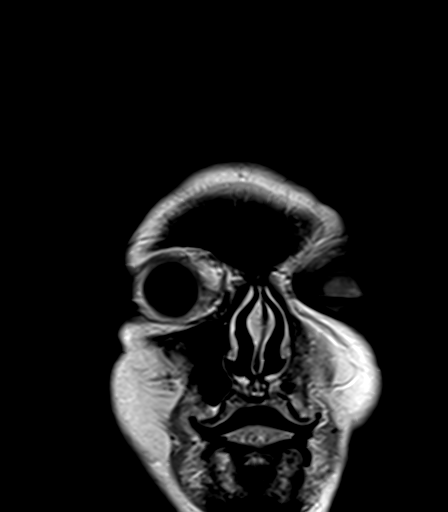
[im 28/28]
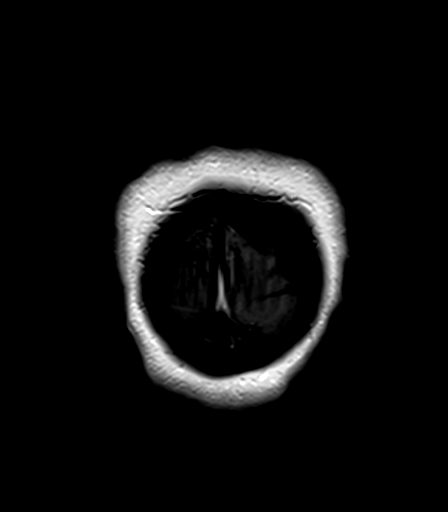

[Series 18: T1 post-contrast · sagittal · 5.0mm · 0.75mm/px · 1 of 22 slices shown (3 of 3)]
[im 1/22]
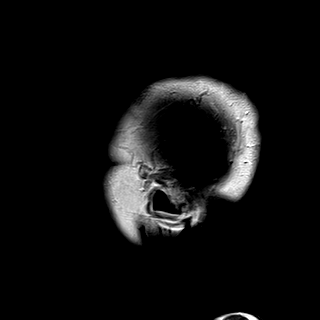

[48 of 48 positions shown; findings below may reference images not displayed]

FINDINGS: Brain:

Mild intermittent motion degradation.

Cerebral volume is normal.

Mild multifocal T2/FLAIR hyperintensity within the cerebral white
matter, nonspecific but compatible with chronic small vessel
ischemic disease.

Punctate chronic microhemorrhage within the mid left frontal lobe.

There is no acute infarct.

No evidence of an intracranial mass.

No extra-axial fluid collection.

No midline shift.

No pathologic intracranial enhancement is demonstrated to suggest
intracranial metastatic disease.

Vascular: Maintained flow voids within the proximal large arterial
vessels.

Skull and upper cervical spine: No focal suspicious marrow lesion.
Susceptibility artifact arising from cervical spinal fusion
hardware.

Sinuses/Orbits: Visualized orbits show no acute finding. No
significant paranasal sinus disease.
IMPRESSION: Mild intermittent motion degradation.

No evidence of intracranial metastatic disease.

Mild chronic small-vessel ischemic changes within cerebral white
matter.

## 2021-07-26 MED ORDER — GADOBUTROL 1 MMOL/ML IV SOLN
8.0000 mL | Freq: Once | INTRAVENOUS | Status: AC | PRN
  Administered 2021-07-26: 8 mL via INTRAVENOUS

## 2021-07-27 ENCOUNTER — Telehealth: Payer: Self-pay | Admitting: Radiation Oncology

## 2021-07-27 NOTE — Telephone Encounter (Signed)
I called the patient to let her know her MRI brain did not show malignancy. We will follow up with her next week for simulation on Wednesday.

## 2021-07-28 ENCOUNTER — Encounter (HOSPITAL_COMMUNITY): Payer: Self-pay | Admitting: Internal Medicine

## 2021-07-28 ENCOUNTER — Encounter: Payer: Self-pay | Admitting: Internal Medicine

## 2021-08-02 ENCOUNTER — Other Ambulatory Visit: Payer: Self-pay

## 2021-08-02 ENCOUNTER — Inpatient Hospital Stay: Payer: 59 | Attending: Internal Medicine

## 2021-08-02 ENCOUNTER — Ambulatory Visit
Admission: RE | Admit: 2021-08-02 | Discharge: 2021-08-02 | Disposition: A | Discharge status: Home / Self Care | Payer: 59 | Source: Ambulatory Visit | Attending: Radiation Oncology | Admitting: Radiation Oncology

## 2021-08-02 DIAGNOSIS — C3412 Malignant neoplasm of upper lobe, left bronchus or lung: Secondary | ICD-10-CM | POA: Insufficient documentation

## 2021-08-02 DIAGNOSIS — Z9981 Dependence on supplemental oxygen: Secondary | ICD-10-CM | POA: Insufficient documentation

## 2021-08-02 DIAGNOSIS — I10 Essential (primary) hypertension: Secondary | ICD-10-CM | POA: Diagnosis not present

## 2021-08-02 DIAGNOSIS — Z510 Encounter for antineoplastic radiation therapy: Secondary | ICD-10-CM | POA: Insufficient documentation

## 2021-08-02 DIAGNOSIS — Z5111 Encounter for antineoplastic chemotherapy: Secondary | ICD-10-CM | POA: Insufficient documentation

## 2021-08-02 DIAGNOSIS — R0602 Shortness of breath: Secondary | ICD-10-CM | POA: Insufficient documentation

## 2021-08-02 DIAGNOSIS — Z79899 Other long term (current) drug therapy: Secondary | ICD-10-CM | POA: Insufficient documentation

## 2021-08-02 DIAGNOSIS — Z87891 Personal history of nicotine dependence: Secondary | ICD-10-CM | POA: Insufficient documentation

## 2021-08-02 DIAGNOSIS — D508 Other iron deficiency anemias: Secondary | ICD-10-CM | POA: Insufficient documentation

## 2021-08-03 ENCOUNTER — Encounter (HOSPITAL_COMMUNITY): Payer: Self-pay

## 2021-08-03 ENCOUNTER — Encounter (HOSPITAL_COMMUNITY): Payer: Self-pay | Admitting: Internal Medicine

## 2021-08-04 ENCOUNTER — Other Ambulatory Visit: Payer: Self-pay | Admitting: Physician Assistant

## 2021-08-04 ENCOUNTER — Telehealth: Payer: Self-pay | Admitting: Medical Oncology

## 2021-08-04 DIAGNOSIS — G893 Neoplasm related pain (acute) (chronic): Secondary | ICD-10-CM

## 2021-08-04 MED ORDER — OXYCODONE-ACETAMINOPHEN 5-325 MG PO TABS: 1.0000 | ORAL_TABLET | Freq: Four times a day (QID) | ORAL | 0 refills | Status: AC | PRN

## 2021-08-04 NOTE — Telephone Encounter (Signed)
Pain management-" I can't go on with this pain"  I just got off phone with her and her son , She is in pain and trying to hold back her tears on the phone.   Uncontrolled Left sided  pain in her back. She reports it is sharp pain and also occurs when she takes a deep breath. She is on oxygen.  Pt is taking Hydrocodone 5/325 mg  and tylenol 650 every 8 hours in between.  She is not taking enough because of her fear about too much tylenol ( >4000 mg)   The tylenol has helped her more than the hydrocodone which only helps for about 2 hours.  Her chemo Vernie Shanks is scheduled to start 09/19.   Cassie is sending in percocet to hopefully help.  Message sent to infusion and xrt to see if pt can  start tx next week?  Message sent to infusion and xrt.

## 2021-08-08 ENCOUNTER — Other Ambulatory Visit: Payer: Self-pay

## 2021-08-08 ENCOUNTER — Inpatient Hospital Stay: Payer: 59

## 2021-08-08 ENCOUNTER — Inpatient Hospital Stay (HOSPITAL_BASED_OUTPATIENT_CLINIC_OR_DEPARTMENT_OTHER): Payer: 59 | Admitting: Internal Medicine

## 2021-08-08 DIAGNOSIS — Z5111 Encounter for antineoplastic chemotherapy: Secondary | ICD-10-CM | POA: Diagnosis not present

## 2021-08-08 DIAGNOSIS — C3492 Malignant neoplasm of unspecified part of left bronchus or lung: Secondary | ICD-10-CM | POA: Diagnosis not present

## 2021-08-08 LAB — CBC WITH DIFFERENTIAL (CANCER CENTER ONLY)
Abs Immature Granulocytes: 0.03 10*3/uL (ref 0.00–0.07)
Basophils Absolute: 0 10*3/uL (ref 0.0–0.1)
Basophils Relative: 0 %
Eosinophils Absolute: 0.2 10*3/uL (ref 0.0–0.5)
Eosinophils Relative: 2 %
HCT: 26.3 % — ABNORMAL LOW (ref 36.0–46.0)
Hemoglobin: 7.3 g/dL — ABNORMAL LOW (ref 12.0–15.0)
Immature Granulocytes: 0 %
Lymphocytes Relative: 11 %
Lymphs Abs: 0.9 10*3/uL (ref 0.7–4.0)
MCH: 21.3 pg — ABNORMAL LOW (ref 26.0–34.0)
MCHC: 27.8 g/dL — ABNORMAL LOW (ref 30.0–36.0)
MCV: 76.7 fL — ABNORMAL LOW (ref 80.0–100.0)
Monocytes Absolute: 0.6 10*3/uL (ref 0.1–1.0)
Monocytes Relative: 7 %
Neutro Abs: 6.5 10*3/uL (ref 1.7–7.7)
Neutrophils Relative %: 80 %
Platelet Count: 446 10*3/uL — ABNORMAL HIGH (ref 150–400)
RBC: 3.43 MIL/uL — ABNORMAL LOW (ref 3.87–5.11)
RDW: 18.8 % — ABNORMAL HIGH (ref 11.5–15.5)
WBC Count: 8.2 10*3/uL (ref 4.0–10.5)
nRBC: 0 % (ref 0.0–0.2)

## 2021-08-08 LAB — CMP (CANCER CENTER ONLY)
ALT: <6 U/L (ref 0–44)
AST: 12 U/L — ABNORMAL LOW (ref 15–41)
Albumin: 2.6 g/dL — ABNORMAL LOW (ref 3.5–5.0)
Alkaline Phosphatase: 97 U/L (ref 38–126)
Anion gap: 14 (ref 5–15)
BUN: 17 mg/dL (ref 8–23)
CO2: 40 mmol/L — ABNORMAL HIGH (ref 22–32)
Calcium: 9.7 mg/dL (ref 8.9–10.3)
Chloride: 83 mmol/L — ABNORMAL LOW (ref 98–111)
Creatinine: 0.8 mg/dL (ref 0.44–1.00)
GFR, Estimated: >60 mL/min (ref >60)
Glucose, Bld: 143 mg/dL — ABNORMAL HIGH (ref 70–99)
Potassium: 3.3 mmol/L — ABNORMAL LOW (ref 3.5–5.1)
Sodium: 137 mmol/L (ref 135–145)
Total Bilirubin: 0.3 mg/dL (ref 0.3–1.2)
Total Protein: 7.6 g/dL (ref 6.5–8.1)

## 2021-08-08 MED ORDER — SODIUM CHLORIDE 0.9 % IV SOLN
10.0000 mg | Freq: Once | INTRAVENOUS | Status: AC
  Administered 2021-08-08: 10 mg via INTRAVENOUS
  Filled 2021-08-08: qty 10

## 2021-08-08 MED ORDER — ACETAMINOPHEN 325 MG PO TABS
325.0000 mg | ORAL_TABLET | Freq: Once | ORAL | Status: AC
  Administered 2021-08-08: 325 mg via ORAL
  Filled 2021-08-08: qty 1

## 2021-08-08 MED ORDER — OXYCODONE HCL 5 MG PO TABS
5.0000 mg | ORAL_TABLET | Freq: Once | ORAL | Status: AC
  Administered 2021-08-08: 5 mg via ORAL
  Filled 2021-08-08: qty 1

## 2021-08-08 MED ORDER — DIPHENHYDRAMINE HCL 50 MG/ML IJ SOLN
50.0000 mg | Freq: Once | INTRAMUSCULAR | Status: AC
  Administered 2021-08-08: 50 mg via INTRAVENOUS
  Filled 2021-08-08: qty 1

## 2021-08-08 MED ORDER — SODIUM CHLORIDE 0.9 % IV SOLN
213.2000 mg | Freq: Once | INTRAVENOUS | Status: AC
  Administered 2021-08-08: 210 mg via INTRAVENOUS
  Filled 2021-08-08: qty 21

## 2021-08-08 MED ORDER — PALONOSETRON HCL INJECTION 0.25 MG/5ML
0.2500 mg | Freq: Once | INTRAVENOUS | Status: AC
  Administered 2021-08-08: 0.25 mg via INTRAVENOUS
  Filled 2021-08-08: qty 5

## 2021-08-08 MED ORDER — FAMOTIDINE 20 MG IN NS 100 ML IVPB
20.0000 mg | Freq: Once | INTRAVENOUS | Status: AC
  Administered 2021-08-08: 20 mg via INTRAVENOUS
  Filled 2021-08-08: qty 100

## 2021-08-08 MED ORDER — SODIUM CHLORIDE 0.9 % IV SOLN: Freq: Once | INTRAVENOUS | Status: AC

## 2021-08-08 MED ORDER — SODIUM CHLORIDE 0.9 % IV SOLN
45.0000 mg/m2 | Freq: Once | INTRAVENOUS | Status: AC
  Administered 2021-08-08: 90 mg via INTRAVENOUS
  Filled 2021-08-08: qty 15

## 2021-08-08 NOTE — Patient Instructions (Signed)
[facility]   Discharge Instructions: Thank you for choosing [facility] to provide your oncology and hematology care.   If you have a lab appointment with the [facility], please go directly to the [facility] and check in at the registration area.   Wear comfortable clothing and clothing appropriate for easy access to any Portacath or PICC line.   We strive to give you quality time with your provider. You may need to reschedule your appointment if you arrive late (15 or more minutes).  Arriving late affects you and other patients whose appointments are after yours.  Also, if you miss three or more appointments without notifying the office, you may be dismissed from the clinic at the provider's discretion.      For prescription refill requests, have your pharmacy contact our office and allow 72 hours for refills to be completed.    Today you received the following chemotherapy and/or immunotherapy agents: paclitaxel and carboplatin.      To help prevent nausea and vomiting after your treatment, we encourage you to take your nausea medication as directed.  BELOW ARE SYMPTOMS THAT SHOULD BE REPORTED IMMEDIATELY: *FEVER GREATER THAN 100.4 F (38 C) OR HIGHER *CHILLS OR SWEATING *NAUSEA AND VOMITING THAT IS NOT CONTROLLED WITH YOUR NAUSEA MEDICATION *UNUSUAL SHORTNESS OF BREATH *UNUSUAL BRUISING OR BLEEDING *URINARY PROBLEMS (pain or burning when urinating, or frequent urination) *BOWEL PROBLEMS (unusual diarrhea, constipation, pain near the anus) TENDERNESS IN MOUTH AND THROAT WITH OR WITHOUT PRESENCE OF ULCERS (sore throat, sores in mouth, or a toothache) UNUSUAL RASH, SWELLING OR PAIN  UNUSUAL VAGINAL DISCHARGE OR ITCHING   Items with * indicate a potential emergency and should be followed up as soon as possible or go to the Emergency Department if any problems should occur.  Please show the CHEMOTHERAPY ALERT CARD or IMMUNOTHERAPY ALERT  CARD at check-in to the Emergency Department and triage nurse.  Should you have questions after your visit or need to cancel or reschedule your appointment, please contact [facility]  Dept: [phone]  and follow the prompts.  Office hours are 8:00 a.m. to 4:30 p.m. Monday - Friday. Please note that voicemails left after 4:00 p.m. may not be returned until the following business day.  We are closed weekends and major holidays. You have access to a nurse at all times for urgent questions. Please call the main number to the clinic Dept: [phone] and follow the prompts.   For any non-urgent questions, you may also contact your provider using MyChart. We now offer e-Visits for anyone 71 and older to request care online for non-urgent symptoms. For details visit [email or web address].   Also download the MyChart app! Go to the app store, search "MyChart", open the app, select [facility], and log in with your MyChart username and password.  Due to Covid, a mask is required upon entering the hospital/clinic. If you do not have a mask, one will be given to you upon arrival. For doctor visits, patients may have 1 support person aged 31 or older with them. For treatment visits, patients cannot have anyone with them due to current Covid guidelines and our immunocompromised population.   Paclitaxel injection What is this medication? PACLITAXEL (PAK li TAX el) is a chemotherapy drug. It targets fast dividing cells, like cancer cells, and causes these cells to die. This medicine is used to treat ovarian cancer, breast cancer, lung cancer, Kaposi's sarcoma, and other cancers.  This medicine may be used for other purposes; ask your health care provider or pharmacist if you have questions. COMMON BRAND NAME(S): Onxol, Taxol What should I tell my care team before I take this medication? They need to know if you have any of these conditions: history of irregular heartbeat liver  disease low blood counts, like low white cell, platelet, or red cell counts lung or breathing disease, like asthma tingling of the fingers or toes, or other nerve disorder an unusual or allergic reaction to paclitaxel, alcohol, polyoxyethylated castor oil, other chemotherapy, other medicines, foods, dyes, or preservatives pregnant or trying to get pregnant breast-feeding How should I use this medication? This drug is given as an infusion into a vein. It is administered in a hospital or clinic by a specially trained health care professional. Talk to your pediatrician regarding the use of this medicine in children. Special care may be needed. Overdosage: If you think you have taken too much of this medicine contact a poison control center or emergency room at once. NOTE: This medicine is only for you. Do not share this medicine with others. What if I miss a dose? It is important not to miss your dose. Call your doctor or health care professional if you are unable to keep an appointment. What may interact with this medication? Do not take this medicine with any of the following medications: live virus vaccines This medicine may also interact with the following medications: antiviral medicines for hepatitis, HIV or AIDS certain antibiotics like erythromycin and clarithromycin certain medicines for fungal infections like ketoconazole and itraconazole certain medicines for seizures like carbamazepine, phenobarbital, phenytoin gemfibrozil nefazodone rifampin St. John's wort This list may not describe all possible interactions. Give your health care provider a list of all the medicines, herbs, non-prescription drugs, or dietary supplements you use. Also tell them if you smoke, drink alcohol, or use illegal drugs. Some items may interact with your medicine. What should I watch for while using this medication? Your condition will be monitored carefully while you are receiving this medicine. You  will need important blood work done while you are taking this medicine. This medicine can cause serious allergic reactions. To reduce your risk you will need to take other medicine(s) before treatment with this medicine. If you experience allergic reactions like skin rash, itching or hives, swelling of the face, lips, or tongue, tell your doctor or health care professional right away. In some cases, you may be given additional medicines to help with side effects. Follow all directions for their use. This drug may make you feel generally unwell. This is not uncommon, as chemotherapy can affect healthy cells as well as cancer cells. Report any side effects. Continue your course of treatment even though you feel ill unless your doctor tells you to stop. Call your doctor or health care professional for advice if you get a fever, chills or sore throat, or other symptoms of a cold or flu. Do not treat yourself. This drug decreases your body's ability to fight infections. Try to avoid being around people who are sick. This medicine may increase your risk to bruise or bleed. Call your doctor or health care professional if you notice any unusual bleeding. Be careful brushing and flossing your teeth or using a toothpick because you may get an infection or bleed more easily. If you have any dental work done, tell your dentist you are receiving this medicine. Avoid taking products that contain aspirin, acetaminophen, ibuprofen, naproxen, or ketoprofen unless  instructed by your doctor. These medicines may hide a fever. Do not become pregnant while taking this medicine. Women should inform their doctor if they wish to become pregnant or think they might be pregnant. There is a potential for serious side effects to an unborn child. Talk to your health care professional or pharmacist for more information. Do not breast-feed an infant while taking this medicine. Men are advised not to father a child while receiving this  medicine. This product may contain alcohol. Ask your pharmacist or healthcare provider if this medicine contains alcohol. Be sure to tell all healthcare providers you are taking this medicine. Certain medicines, like metronidazole and disulfiram, can cause an unpleasant reaction when taken with alcohol. The reaction includes flushing, headache, nausea, vomiting, sweating, and increased thirst. The reaction can last from 30 minutes to several hours. What side effects may I notice from receiving this medication? Side effects that you should report to your doctor or health care professional as soon as possible: allergic reactions like skin rash, itching or hives, swelling of the face, lips, or tongue breathing problems changes in vision fast, irregular heartbeat high or low blood pressure mouth sores pain, tingling, numbness in the hands or feet signs of decreased platelets or bleeding - bruising, pinpoint red spots on the skin, black, tarry stools, blood in the urine signs of decreased red blood cells - unusually weak or tired, feeling faint or lightheaded, falls signs of infection - fever or chills, cough, sore throat, pain or difficulty passing urine signs and symptoms of liver injury like dark yellow or brown urine; general ill feeling or flu-like symptoms; light-colored stools; loss of appetite; nausea; right upper belly pain; unusually weak or tired; yellowing of the eyes or skin swelling of the ankles, feet, hands unusually slow heartbeat Side effects that usually do not require medical attention (report to your doctor or health care professional if they continue or are bothersome): diarrhea hair loss loss of appetite muscle or joint pain nausea, vomiting pain, redness, or irritation at site where injected tiredness This list may not describe all possible side effects. Call your doctor for medical advice about side effects. You may report side effects to FDA at 1-800-FDA-1088. Where  should I keep my medication? This drug is given in a hospital or clinic and will not be stored at home. NOTE: This sheet is a summary. It may not cover all possible information. If you have questions about this medicine, talk to your doctor, pharmacist, or health care provider.  2022 Elsevier/Gold Standard (2019-10-14 13:37:23)  Carboplatin injection What is this medication? CARBOPLATIN (KAR boe pla tin) is a chemotherapy drug. It targets fast dividing cells, like cancer cells, and causes these cells to die. This medicine is used to treat ovarian cancer and many other cancers. This medicine may be used for other purposes; ask your health care provider or pharmacist if you have questions. COMMON BRAND NAME(S): Paraplatin What should I tell my care team before I take this medication? They need to know if you have any of these conditions: blood disorders hearing problems kidney disease recent or ongoing radiation therapy an unusual or allergic reaction to carboplatin, cisplatin, other chemotherapy, other medicines, foods, dyes, or preservatives pregnant or trying to get pregnant breast-feeding How should I use this medication? This drug is usually given as an infusion into a vein. It is administered in a hospital or clinic by a specially trained health care professional. Talk to your pediatrician regarding the use of this  medicine in children. Special care may be needed. Overdosage: If you think you have taken too much of this medicine contact a poison control center or emergency room at once. NOTE: This medicine is only for you. Do not share this medicine with others. What if I miss a dose? It is important not to miss a dose. Call your doctor or health care professional if you are unable to keep an appointment. What may interact with this medication? medicines for seizures medicines to increase blood counts like filgrastim, pegfilgrastim, sargramostim some antibiotics like amikacin,  gentamicin, neomycin, streptomycin, tobramycin vaccines Talk to your doctor or health care professional before taking any of these medicines: acetaminophen aspirin ibuprofen ketoprofen naproxen This list may not describe all possible interactions. Give your health care provider a list of all the medicines, herbs, non-prescription drugs, or dietary supplements you use. Also tell them if you smoke, drink alcohol, or use illegal drugs. Some items may interact with your medicine. What should I watch for while using this medication? Your condition will be monitored carefully while you are receiving this medicine. You will need important blood work done while you are taking this medicine. This drug may make you feel generally unwell. This is not uncommon, as chemotherapy can affect healthy cells as well as cancer cells. Report any side effects. Continue your course of treatment even though you feel ill unless your doctor tells you to stop. In some cases, you may be given additional medicines to help with side effects. Follow all directions for their use. Call your doctor or health care professional for advice if you get a fever, chills or sore throat, or other symptoms of a cold or flu. Do not treat yourself. This drug decreases your body's ability to fight infections. Try to avoid being around people who are sick. This medicine may increase your risk to bruise or bleed. Call your doctor or health care professional if you notice any unusual bleeding. Be careful brushing and flossing your teeth or using a toothpick because you may get an infection or bleed more easily. If you have any dental work done, tell your dentist you are receiving this medicine. Avoid taking products that contain aspirin, acetaminophen, ibuprofen, naproxen, or ketoprofen unless instructed by your doctor. These medicines may hide a fever. Do not become pregnant while taking this medicine. Women should inform their doctor if they wish  to become pregnant or think they might be pregnant. There is a potential for serious side effects to an unborn child. Talk to your health care professional or pharmacist for more information. Do not breast-feed an infant while taking this medicine. What side effects may I notice from receiving this medication? Side effects that you should report to your doctor or health care professional as soon as possible: allergic reactions like skin rash, itching or hives, swelling of the face, lips, or tongue signs of infection - fever or chills, cough, sore throat, pain or difficulty passing urine signs of decreased platelets or bleeding - bruising, pinpoint red spots on the skin, black, tarry stools, nosebleeds signs of decreased red blood cells - unusually weak or tired, fainting spells, lightheadedness breathing problems changes in hearing changes in vision chest pain high blood pressure low blood counts - This drug may decrease the number of white blood cells, red blood cells and platelets. You may be at increased risk for infections and bleeding. nausea and vomiting pain, swelling, redness or irritation at the injection site pain, tingling, numbness in the hands  or feet problems with balance, talking, walking trouble passing urine or change in the amount of urine Side effects that usually do not require medical attention (report to your doctor or health care professional if they continue or are bothersome): hair loss loss of appetite metallic taste in the mouth or changes in taste This list may not describe all possible side effects. Call your doctor for medical advice about side effects. You may report side effects to FDA at 1-800-FDA-1088. Where should I keep my medication? This drug is given in a hospital or clinic and will not be stored at home. NOTE: This sheet is a summary. It may not cover all possible information. If you have questions about this medicine, talk to your doctor, pharmacist,  or health care provider.  2022 Elsevier/Gold Standard (2008-02-17 14:38:05)

## 2021-08-08 NOTE — Progress Notes (Signed)
[place] Telephone:([phone]) [phone]   Fax:([phone]  OFFICE PROGRESS NOTE  Greig Right, MD [place] [address]  DIAGNOSIS: Stage IIIa (T4, N1, M0) non-small cell lung cancer, adenocarcinoma presented with large left upper lobe lung mass with chest wall invasion in addition to left suprahilar lymphadenopathy diagnosed in August 2022.  Biomarker Findings Tumor Mutational Burden - 24 Muts/Mb Microsatellite status - MS-Stable Genomic Findings For a complete list of the genes assayed, please refer to the Appendix. KRAS G12C ERBB2 amplification - equivocal? ARID1A M1420f*1 RICTOR amplification STK11 [id number] EPHA3 amplification - equivocal? FGF10amplification - equivocal? LYN amplification NFKBIA amplification - equivocal? NKX2-1 amplification - equivocal? SMAD2 Q116* SMARCA4 rearrangement intron 4 TP53 V157F 6 Disease relevant genes with no reportable alterations: ALK, BRAF, EGFR, MET, RET, ROS1  PDL1 Expression 0%  PRIOR THERAPY: None  CURRENT THERAPY: Concurrent chemoradiation with weekly carboplatin for AUC of 2 and paclitaxel 45 Mg/M2.  First dose August 08, 2021.  INTERVAL HISTORY: Patricia [id number]. female returns to the clinic today for follow-up visit.  The patient is feeling fine today with no concerning complaints except for the pain in the left chest wall from the tumor invasion.  She also has shortness of breath at baseline increased with exertion and currently on home oxygen.  The patient is here today for evaluation before starting the first dose of her concurrent chemoradiation.  She is expected to start the first fraction of radiotherapy tomorrow.  She has no hemoptysis.  She denied having any recent weight loss or night sweats.  She has no nausea, vomiting, diarrhea or constipation.  She denied having any fever or chills.  MEDICAL HISTORY: Past Medical History:  Diagnosis Date    Angioedema    COPD (chronic obstructive pulmonary disease) (HCC)    FHx: migraine headaches    Hyperlipidemia    Hypertension    Seasonal allergies     ALLERGIES:  is allergic to dyazide [hydrochlorothiazide w-triamterene] and zestoretic [lisinopril-hydrochlorothiazide].  MEDICATIONS:  Current Outpatient Medications  Medication Sig Dispense Refill   albuterol (VENTOLIN HFA) 108 (90 Base) MCG/ACT inhaler Inhale 2 puffs into the lungs every 4 (four) hours as needed for wheezing or shortness of breath. (Patient not taking: No sig reported) 18 g 5   albuterol (VENTOLIN HFA) 108 (90 Base) MCG/ACT inhaler Inhale 2 puffs into the lungs every 6 (six) hours as needed for wheezing or shortness of breath. (Patient not taking: No sig reported) 8 g 6   amitriptyline (ELAVIL) 10 MG tablet Take 10 mg by mouth daily.     amLODipine (NORVASC) 2.5 MG tablet Take 2.5 mg by mouth daily.     chlorthalidone (HYGROTON) 25 MG tablet Take 50 mg by mouth daily.      cholecalciferol (VITAMIN D3) 25 MCG (1000 UNIT) tablet Take 1,000 Units by mouth daily.     furosemide (LASIX) 20 MG tablet Take 20 mg by mouth daily.     Magnesium 250 MG TABS Take 250 mg by mouth daily.     nystatin (MYCOSTATIN) 100000 UNIT/ML suspension Take 5 mLs (500,000 Units total) by mouth 4 (four) times daily. 60 mL 0   oxyCODONE-acetaminophen (PERCOCET/ROXICET) 5-325 MG tablet Take 1 tablet by mouth every 6 (six) hours as needed for severe pain. 30 tablet 0   potassium chloride (MICRO-K) 10 MEQ CR capsule Take 10 mEq by mouth daily.     prochlorperazine (COMPAZINE) 10 MG tablet Take 1  tablet (10 mg total) by mouth every 6 (six) hours as needed for nausea or vomiting. 30 tablet 0   rosuvastatin (CRESTOR) 10 MG tablet Take 10 mg by mouth daily.     SYMBICORT 160-4.5 MCG/ACT inhaler TAKE 2 PUFFS BY MOUTH TWICE A DAY 10.2 each 5   Tiotropium Bromide Monohydrate (SPIRIVA RESPIMAT) 2.5 MCG/ACT AERS Inhale 2 puffs into the lungs daily. 4 g 5   No  current facility-administered medications for this visit.   Facility-Administered Medications Ordered in Other Visits  Medication Dose Route Frequency Provider Last Rate Last Admin   0.9 %  sodium chloride infusion   Intravenous Once Curt Bears, MD       CARBOplatin (PARAPLATIN) 210 mg in sodium chloride 0.9 % 100 mL chemo infusion  210 mg Intravenous Once Curt Bears, MD       dexamethasone (DECADRON) 10 mg in sodium chloride 0.9 % 50 mL IVPB  10 mg Intravenous Once Curt Bears, MD       diphenhydrAMINE (BENADRYL) injection 50 mg  50 mg Intravenous Once Curt Bears, MD       famotidine (PEPCID) IVPB 20 mg in NS 100 mL IVPB  20 mg Intravenous Once Curt Bears, MD       PACLitaxel (TAXOL) 90 mg in sodium chloride 0.9 % 250 mL chemo infusion (</= 64m/m2)  45 mg/m2 (Treatment Plan Recorded) Intravenous Once MCurt Bears MD       palonosetron (ALOXI) injection 0.25 mg  0.25 mg Intravenous Once MCurt Bears MD        SURGICAL HISTORY: No past surgical history on file.  REVIEW OF SYSTEMS:  A comprehensive review of systems was negative except for: Constitutional: positive for fatigue Respiratory: positive for cough, dyspnea on exertion, and pleurisy/chest pain   PHYSICAL EXAMINATION: General appearance: alert, cooperative, fatigued, and no distress Head: Normocephalic, without obvious abnormality, atraumatic Neck: no adenopathy, no JVD, supple, symmetrical, trachea midline, and thyroid not enlarged, symmetric, no tenderness/mass/nodules Lymph nodes: Cervical, supraclavicular, and axillary nodes normal. Resp: clear to auscultation bilaterally Back: symmetric, no curvature. ROM normal. No CVA tenderness. Cardio: regular rate and rhythm, S1, S2 normal, no murmur, click, rub or gallop GI: soft, non-tender; bowel sounds normal; no masses,  no organomegaly Extremities: extremities normal, atraumatic, no cyanosis or edema  ECOG PERFORMANCE STATUS: 1 - Symptomatic  but completely ambulatory  There were no vitals taken for this visit.  LABORATORY DATA: Lab Results  Component Value Date   WBC 8.2 08/08/2021   HGB 7.3 (L) 08/08/2021   HCT 26.3 (L) 08/08/2021   MCV 76.7 (L) 08/08/2021   PLT 446 (H) 08/08/2021      Chemistry      Component Value Date/Time   NA 137 08/08/2021 0839   K 3.3 (L) 08/08/2021 0839   CL 83 (L) 08/08/2021 0839   CO2 40 (H) 08/08/2021 0839   BUN 17 08/08/2021 0839   CREATININE 0.80 08/08/2021 0839      Component Value Date/Time   CALCIUM 9.7 08/08/2021 0839   ALKPHOS 97 08/08/2021 0839   AST 12 (L) 08/08/2021 0839   ALT <6 08/08/2021 0839   BILITOT 0.3 08/08/2021 0839       RADIOGRAPHIC STUDIES: MR BRAIN W WO CONTRAST  Result Date: 07/26/2021 CLINICAL DATA:  Malignant neoplasm of unspecified part of unspecified bronchus or lung (HCC) C34.90 (ICD-10-CM). Non-small-cell lung cancer, staging. EXAM: MRI HEAD WITHOUT AND WITH CONTRAST TECHNIQUE: Multiplanar, multiecho pulse sequences of the brain and surrounding structures were obtained  without and with intravenous contrast. CONTRAST:  2m GADAVIST GADOBUTROL 1 MMOL/ML IV SOLN COMPARISON:  PET-CT 06/23/2021. FINDINGS: Brain: Mild intermittent motion degradation. Cerebral volume is normal. Mild multifocal T2/FLAIR hyperintensity within the cerebral white matter, nonspecific but compatible with chronic small vessel ischemic disease. Punctate chronic microhemorrhage within the mid left frontal lobe. There is no acute infarct. No evidence of an intracranial mass. No extra-axial fluid collection. No midline shift. No pathologic intracranial enhancement is demonstrated to suggest intracranial metastatic disease. Vascular: Maintained flow voids within the proximal large arterial vessels. Skull and upper cervical spine: No focal suspicious marrow lesion. Susceptibility artifact arising from cervical spinal fusion hardware. Sinuses/Orbits: Visualized orbits show no acute finding. No  significant paranasal sinus disease. IMPRESSION: Mild intermittent motion degradation. No evidence of intracranial metastatic disease. Mild chronic small-vessel ischemic changes within cerebral white matter. Electronically Signed   By: KKellie SimmeringD.O.   On: 07/26/2021 09:52   DG Chest Port 1 View  Result Date: 07/10/2021 CLINICAL DATA:  Status post lung biopsy. EXAM: PORTABLE CHEST 1 VIEW COMPARISON:  CT chest 04/28/2021 FINDINGS: 8.9 cm left upper lobe mass redemonstrated with bone destruction of the left second anterior rib. Large left perihilar mass likely reflecting an enlarged lymph node. No pleural effusion. No pneumothorax. Underlying bilateral emphysematous changes. Stable heart size. No acute osseous abnormality. 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 IMPRESSION: 1. Pneumothorax status post left lung biopsy. 2. 8.9 cm left upper lobe mass redemonstrated with bone destruction of the left second anterior rib. Large left perihilar mass likely reflecting an enlarged lymph node. Electronically Signed   By: HKathreen DevoidM.D.   On: 07/10/2021 14:14   CT LUNG MASS BIOPSY  Result Date: 07/10/2021 INDICATION: 65year old female with left upper lobe pleural based pulmonary mass with invasion of the overlying ribs. She presents for CT-guided core biopsy of the same. EXAM: CT-guided biopsy left lobe pulmonary mass Interventional Radiologist:  HCriselda Peaches MD MEDICATIONS: None. ANESTHESIA/SEDATION: Fentanyl 50 mcg IV; Versed 1.5 mg IV Moderate Sedation Time:  15 minutes The patient was continuously monitored during the procedure by the interventional radiology nurse under my direct supervision. FLUOROSCOPY TIME:  None. COMPLICATIONS: None immediate. Estimated blood loss:  0 PROCEDURE: Informed written consent was obtained from the patient after a thorough discussion of the procedural risks, benefits and alternatives. All questions were addressed. Maximal Sterile Barrier  Technique was utilized including caps, mask, sterile gowns, sterile gloves, sterile drape, hand hygiene and skin antiseptic. A timeout was performed prior to the initiation of the procedure. A planning axial CT scan was performed. The mass in the left upper lobe was successfully identified. A suitable skin entry site was selected and marked. The region was then sterilely prepped and draped in standard fashion using Betadine skin prep. Local anesthesia was attained by infiltration with 1% lidocaine. A small dermatotomy was made. Under intermittent CT fluoroscopic guidance, a 17 gauge trocar needle was advanced into the lung and positioned at the margin of the nodule. Multiple 18 gauge core biopsies were then coaxially obtained using the BioPince automated biopsy device. Biopsy specimens were placed in formalin and delivered to pathology for further analysis. The biopsy device and introducer needle were removed. Post biopsy axial CT imaging demonstrates no evidence of immediate complication. There is no pneumothorax. The patient tolerated the procedure well. IMPRESSION: Technically successful CT-guided biopsy left upper lobe pulmonary mass. Electronically Signed   By: HJacqulynn CadetM.D.   On: 07/10/2021 13:40    ASSESSMENT  AND PLAN: This is a very pleasant 65 years old African-American female recently diagnosed with a stage IIIa (T4, N1, M0) non-small cell lung cancer, adenocarcinoma presented with large left upper lobe lung mass with chest wall invasion in addition to left suprahilar adenopathy diagnosed in August 2022 and the molecular study are positive for KRAS G12C mutation with negative PD-L1 expression. The patient is currently undergoing a course of concurrent chemoradiation with weekly carboplatin for AUC of 2 and paclitaxel 45 Mg/M2, she is starting the first cycle of her treatment today.  She is expected to start the first fraction of radiotherapy tomorrow. I recommended for the patient to proceed  with her treatment as planned. I will see her back for follow-up visit in 2 weeks for evaluation and management of any adverse effect of her treatment. For the pain management she will continue on Percocet on as-needed basis and we will give her a dose in the clinic today. The patient was advised to call immediately if she has any other concerning complaints in the interval. The patient voices understanding of current disease status and treatment options and is in agreement with the current care plan.  All questions were answered. The patient knows to call the clinic with any problems, questions or concerns. We can certainly see the patient much sooner if necessary.   Disclaimer: This note was dictated with voice recognition software. Similar sounding words can inadvertently be transcribed and may not be corrected upon review.

## 2021-08-08 NOTE — Progress Notes (Signed)
Ok to treat today with Hgb 7.3 and HR 118 per Dr. Julien Nordmann.

## 2021-08-09 ENCOUNTER — Encounter: Payer: Self-pay | Admitting: Internal Medicine

## 2021-08-09 ENCOUNTER — Ambulatory Visit
Admission: RE | Admit: 2021-08-09 | Discharge: 2021-08-09 | Disposition: A | Discharge status: Home / Self Care | Payer: 59 | Source: Ambulatory Visit | Attending: Radiation Oncology | Admitting: Radiation Oncology

## 2021-08-09 DIAGNOSIS — Z5111 Encounter for antineoplastic chemotherapy: Secondary | ICD-10-CM | POA: Diagnosis not present

## 2021-08-09 NOTE — Progress Notes (Signed)
Met w/ pt to introduce myself as her Arboriculturist.  Unfortunately there aren't any foundations offering copay assistance for her Dx and the type of ins she has.  I informed her of the [employer], went over what it covers, gave her the income requirement and an expense sheet.  Pt would like to apply so she will bring her proof of income another time.  I gave her my card for any questions or concerns she may have in the future.

## 2021-08-10 ENCOUNTER — Ambulatory Visit
Admission: RE | Admit: 2021-08-10 | Discharge: 2021-08-10 | Disposition: A | Discharge status: Home / Self Care | Payer: 59 | Source: Ambulatory Visit | Attending: Radiation Oncology | Admitting: Radiation Oncology

## 2021-08-10 ENCOUNTER — Other Ambulatory Visit: Payer: Self-pay

## 2021-08-10 DIAGNOSIS — Z5111 Encounter for antineoplastic chemotherapy: Secondary | ICD-10-CM | POA: Diagnosis not present

## 2021-08-10 NOTE — Progress Notes (Signed)
Pt here for patient teaching.  Pt given Radiation and You booklet, skin care instructions, and Sonafine.  Reviewed areas of pertinence such as fatigue, hair loss, skin changes, throat changes, cough, and shortness of breath . Pt able to give teach back of to pat skin and use unscented/gentle soap,apply Sonafine bid and avoid applying anything to skin within 4 hours of treatment. Pt verbalizes understanding of information given and will contact nursing with any questions or concerns.     [email or web address]/treatmentinformation/whattoexpect/index  Patricia Sampson. Leonie Green, BSN

## 2021-08-11 ENCOUNTER — Ambulatory Visit
Admission: RE | Admit: 2021-08-11 | Discharge: 2021-08-11 | Disposition: A | Discharge status: Home / Self Care | Payer: 59 | Source: Ambulatory Visit | Attending: Radiation Oncology | Admitting: Radiation Oncology

## 2021-08-11 ENCOUNTER — Encounter: Payer: Self-pay | Admitting: Internal Medicine

## 2021-08-11 DIAGNOSIS — Z5111 Encounter for antineoplastic chemotherapy: Secondary | ICD-10-CM | POA: Diagnosis not present

## 2021-08-11 DIAGNOSIS — C3492 Malignant neoplasm of unspecified part of left bronchus or lung: Secondary | ICD-10-CM

## 2021-08-11 MED ORDER — SONAFINE EX EMUL
1.0000 | Freq: Once | CUTANEOUS | Status: AC
Start: 2021-08-11 — End: 2021-08-11
  Administered 2021-08-11: 1 via TOPICAL

## 2021-08-11 MED FILL — Dexamethasone Sodium Phosphate Inj 100 MG/10ML: INTRAMUSCULAR | Qty: 1 | Status: AC

## 2021-08-11 NOTE — Progress Notes (Signed)
Pt is approved for the $1000 [employer].

## 2021-08-14 ENCOUNTER — Inpatient Hospital Stay: Payer: 59

## 2021-08-14 ENCOUNTER — Inpatient Hospital Stay: Payer: 59 | Admitting: Nutrition

## 2021-08-14 ENCOUNTER — Other Ambulatory Visit: Payer: Self-pay | Admitting: Medical Oncology

## 2021-08-14 ENCOUNTER — Ambulatory Visit
Admission: RE | Admit: 2021-08-14 | Discharge: 2021-08-14 | Disposition: A | Discharge status: Home / Self Care | Payer: 59 | Source: Ambulatory Visit | Attending: Radiation Oncology | Admitting: Radiation Oncology

## 2021-08-14 ENCOUNTER — Telehealth: Payer: Self-pay | Admitting: Medical Oncology

## 2021-08-14 ENCOUNTER — Encounter: Payer: Self-pay | Admitting: Internal Medicine

## 2021-08-14 ENCOUNTER — Other Ambulatory Visit: Payer: Self-pay

## 2021-08-14 VITALS — BP 107/59 | HR 105 | Temp 98.0°F | Resp 18

## 2021-08-14 DIAGNOSIS — D6481 Anemia due to antineoplastic chemotherapy: Secondary | ICD-10-CM

## 2021-08-14 DIAGNOSIS — C3492 Malignant neoplasm of unspecified part of left bronchus or lung: Secondary | ICD-10-CM

## 2021-08-14 DIAGNOSIS — Z5111 Encounter for antineoplastic chemotherapy: Secondary | ICD-10-CM | POA: Diagnosis not present

## 2021-08-14 LAB — ABO/RH: ABO/RH(D): O POS

## 2021-08-14 LAB — CMP (CANCER CENTER ONLY)
ALT: 6 U/L (ref 0–44)
AST: 15 U/L (ref 15–41)
Albumin: 2.4 g/dL — ABNORMAL LOW (ref 3.5–5.0)
Alkaline Phosphatase: 77 U/L (ref 38–126)
Anion gap: 12 (ref 5–15)
BUN: 20 mg/dL (ref 8–23)
CO2: 41 mmol/L — ABNORMAL HIGH (ref 22–32)
Calcium: 9.9 mg/dL (ref 8.9–10.3)
Chloride: 82 mmol/L — ABNORMAL LOW (ref 98–111)
Creatinine: 0.77 mg/dL (ref 0.44–1.00)
GFR, Estimated: >60 mL/min (ref >60)
Glucose, Bld: 109 mg/dL — ABNORMAL HIGH (ref 70–99)
Potassium: 3.4 mmol/L — ABNORMAL LOW (ref 3.5–5.1)
Sodium: 135 mmol/L (ref 135–145)
Total Bilirubin: 0.4 mg/dL (ref 0.3–1.2)
Total Protein: 7.2 g/dL (ref 6.5–8.1)

## 2021-08-14 LAB — CBC WITH DIFFERENTIAL (CANCER CENTER ONLY)
Abs Immature Granulocytes: 0.15 10*3/uL — ABNORMAL HIGH (ref 0.00–0.07)
Basophils Absolute: 0 10*3/uL (ref 0.0–0.1)
Basophils Relative: 0 %
Eosinophils Absolute: 0.1 10*3/uL (ref 0.0–0.5)
Eosinophils Relative: 1 %
HCT: 22.2 % — ABNORMAL LOW (ref 36.0–46.0)
Hemoglobin: 6.4 g/dL — CL (ref 12.0–15.0)
Immature Granulocytes: 2 %
Lymphocytes Relative: 4 %
Lymphs Abs: 0.3 10*3/uL — ABNORMAL LOW (ref 0.7–4.0)
MCH: 21.8 pg — ABNORMAL LOW (ref 26.0–34.0)
MCHC: 28.8 g/dL — ABNORMAL LOW (ref 30.0–36.0)
MCV: 75.8 fL — ABNORMAL LOW (ref 80.0–100.0)
Monocytes Absolute: 0.5 10*3/uL (ref 0.1–1.0)
Monocytes Relative: 6 %
Neutro Abs: 7.5 10*3/uL (ref 1.7–7.7)
Neutrophils Relative %: 87 %
Platelet Count: 307 10*3/uL (ref 150–400)
RBC: 2.93 MIL/uL — ABNORMAL LOW (ref 3.87–5.11)
RDW: 19.1 % — ABNORMAL HIGH (ref 11.5–15.5)
WBC Count: 8.5 10*3/uL (ref 4.0–10.5)
nRBC: 0 % (ref 0.0–0.2)

## 2021-08-14 LAB — PREPARE RBC (CROSSMATCH)

## 2021-08-14 MED ORDER — SODIUM CHLORIDE 0.9 % IV SOLN
10.0000 mg | Freq: Once | INTRAVENOUS | Status: AC
  Administered 2021-08-14: 10 mg via INTRAVENOUS
  Filled 2021-08-14: qty 10

## 2021-08-14 MED ORDER — PALONOSETRON HCL INJECTION 0.25 MG/5ML
0.2500 mg | Freq: Once | INTRAVENOUS | Status: AC
  Administered 2021-08-14: 0.25 mg via INTRAVENOUS
  Filled 2021-08-14: qty 5

## 2021-08-14 MED ORDER — DIPHENHYDRAMINE HCL 25 MG PO CAPS: 25.0000 mg | ORAL_CAPSULE | Freq: Once | ORAL | Status: DC

## 2021-08-14 MED ORDER — SODIUM CHLORIDE 0.9 % IV SOLN
210.0000 mg | Freq: Once | INTRAVENOUS | Status: AC
  Administered 2021-08-14: 210 mg via INTRAVENOUS
  Filled 2021-08-14: qty 21

## 2021-08-14 MED ORDER — SODIUM CHLORIDE 0.9 % IV SOLN: Freq: Once | INTRAVENOUS | Status: AC

## 2021-08-14 MED ORDER — SODIUM CHLORIDE 0.9 % IV SOLN
45.0000 mg/m2 | Freq: Once | INTRAVENOUS | Status: AC
  Administered 2021-08-14: 90 mg via INTRAVENOUS
  Filled 2021-08-14: qty 15

## 2021-08-14 MED ORDER — FAMOTIDINE 20 MG IN NS 100 ML IVPB
20.0000 mg | Freq: Once | INTRAVENOUS | Status: AC
  Administered 2021-08-14: 20 mg via INTRAVENOUS
  Filled 2021-08-14: qty 100

## 2021-08-14 MED ORDER — SODIUM CHLORIDE 0.9% IV SOLUTION: 250.0000 mL | Freq: Once | INTRAVENOUS | Status: DC

## 2021-08-14 MED ORDER — DIPHENHYDRAMINE HCL 50 MG/ML IJ SOLN
50.0000 mg | Freq: Once | INTRAMUSCULAR | Status: AC
  Administered 2021-08-14: 50 mg via INTRAVENOUS
  Filled 2021-08-14: qty 1

## 2021-08-14 MED ORDER — ACETAMINOPHEN 325 MG PO TABS
650.0000 mg | ORAL_TABLET | Freq: Once | ORAL | Status: AC
  Administered 2021-08-14: 650 mg via ORAL
  Filled 2021-08-14: qty 2

## 2021-08-14 NOTE — Telephone Encounter (Signed)
CRITICAL VALUE STICKER  CRITICAL VALUE: HGB 6.4  RECEIVER (on-site recipient of call):Sheffield Hawker  DATE & TIME NOTIFIED: 08/14/2021 _0   MESSENGER (representative from lab):Pam  MD NOTIFIED: Julien Nordmann  TIME OF NOTIFICATION:08/14/2021 _1   RESPONSE:  blood transfusion ordered for 2 units.

## 2021-08-14 NOTE — Telephone Encounter (Signed)
ERROR

## 2021-08-14 NOTE — Progress Notes (Signed)
Per Dr. Julien Nordmann ,it is okay to treat pt today with Taxol and carboplatin and hgb of 6.4.  Per Dr Julien Nordmann , cancel the order for benadryl 25 mg prior to blood transfusion.

## 2021-08-14 NOTE — Patient Instructions (Signed)
[facility]  Discharge Instructions: Thank you for choosing [facility] to provide your oncology and hematology care.   If you have a lab appointment with the [facility], please go directly to the [facility] and check in at the registration area.   Wear comfortable clothing and clothing appropriate for easy access to any Portacath or PICC line.   We strive to give you quality time with your provider. You may need to reschedule your appointment if you arrive late (15 or more minutes).  Arriving late affects you and other patients whose appointments are after yours.  Also, if you miss three or more appointments without notifying the office, you may be dismissed from the clinic at the provider's discretion.      For prescription refill requests, have your pharmacy contact our office and allow 72 hours for refills to be completed.    Today you received the following chemotherapy and/or immunotherapy agents : Taxol, Carboplatin      To help prevent nausea and vomiting after your treatment, we encourage you to take your nausea medication as directed.  BELOW ARE SYMPTOMS THAT SHOULD BE REPORTED IMMEDIATELY: *FEVER GREATER THAN 100.4 F (38 C) OR HIGHER *CHILLS OR SWEATING *NAUSEA AND VOMITING THAT IS NOT CONTROLLED WITH YOUR NAUSEA MEDICATION *UNUSUAL SHORTNESS OF BREATH *UNUSUAL BRUISING OR BLEEDING *URINARY PROBLEMS (pain or burning when urinating, or frequent urination) *BOWEL PROBLEMS (unusual diarrhea, constipation, pain near the anus) TENDERNESS IN MOUTH AND THROAT WITH OR WITHOUT PRESENCE OF ULCERS (sore throat, sores in mouth, or a toothache) UNUSUAL RASH, SWELLING OR PAIN  UNUSUAL VAGINAL DISCHARGE OR ITCHING   Items with * indicate a potential emergency and should be followed up as soon as possible or go to the Emergency Department if any problems should occur.  Please show the CHEMOTHERAPY ALERT CARD or IMMUNOTHERAPY ALERT CARD at  check-in to the Emergency Department and triage nurse.  Should you have questions after your visit or need to cancel or reschedule your appointment, please contact [facility]  Dept: [phone]  and follow the prompts.  Office hours are 8:00 a.m. to 4:30 p.m. Monday - Friday. Please note that voicemails left after 4:00 p.m. may not be returned until the following business day.  We are closed weekends and major holidays. You have access to a nurse at all times for urgent questions. Please call the main number to the clinic Dept: [phone] and follow the prompts.   For any non-urgent questions, you may also contact your provider using MyChart. We now offer e-Visits for anyone 4 and older to request care online for non-urgent symptoms. For details visit [email or web address].   Also download the MyChart app! Go to the app store, search "MyChart", open the app, select [facility], and log in with your MyChart username and password.  Due to Covid, a mask is required upon entering the hospital/clinic. If you do not have a mask, one will be given to you upon arrival. For doctor visits, patients may have 1 support person aged 63 or older with them. For treatment visits, patients cannot have anyone with them due to current Covid guidelines and our immunocompromised population.   Blood Transfusion, Adult, Care After This sheet gives you information about how to care for yourself after your procedure. Your doctor may also give you more specific instructions. If you have problems or questions, contact your doctor. What can I expect after the procedure? After the procedure,  it is common to have: Bruising and soreness at the IV site. A headache. Follow these instructions at home: Insertion site care   Follow instructions from your doctor about how to take care of your insertion site. This is where an IV tube was put into your vein. Make sure you: Wash your hands with soap  and water before and after you change your bandage (dressing). If you cannot use soap and water, use hand sanitizer. Change your bandage as told by your doctor. Check your insertion site every day for signs of infection. Check for: Redness, swelling, or pain. Bleeding from the site. Warmth. Pus or a bad smell. General instructions Take over-the-counter and prescription medicines only as told by your doctor. Rest as told by your doctor. Go back to your normal activities as told by your doctor. Keep all follow-up visits as told by your doctor. This is important. Contact a doctor if: You have itching or red, swollen areas of skin (hives). You feel worried or nervous (anxious). You feel weak after doing your normal activities. You have redness, swelling, warmth, or pain around the insertion site. You have blood coming from the insertion site, and the blood does not stop with pressure. You have pus or a bad smell coming from the insertion site. Get help right away if: You have signs of a serious reaction. This may be coming from an allergy or the body's defense system (immune system). Signs include: Trouble breathing or shortness of breath. Swelling of the face or feeling warm (flushed). Fever or chills. Head, chest, or back pain. Dark pee (urine) or blood in the pee. Widespread rash. Fast heartbeat. Feeling dizzy or light-headed. You may receive your blood transfusion in an outpatient setting. If so, you will be told whom to contact to report any reactions. These symptoms may be an emergency. Do not wait to see if the symptoms will go away. Get medical help right away. Call your local emergency services (911 in the [place] Do not drive yourself to the hospital. Summary Bruising and soreness at the IV site are common. Check your insertion site every day for signs of infection. Rest as told by your doctor. Go back to your normal activities as told by your doctor. Get help right away if  you have signs of a serious reaction. This information is not intended to replace advice given to you by your health care provider. Make sure you discuss any questions you have with your health care provider. Document Revised: 03/09/2021 Document Reviewed: 05/07/2019 Elsevier Patient Education  [place].

## 2021-08-14 NOTE — Progress Notes (Signed)
Patient was identified to be at risk for malnutrition on the MST secondary to poor appetite and weight loss.  65 year old female diagnosed with lung cancer and followed by Dr. Julien Nordmann and Dr. Lisbeth Renshaw.  CURRENT THERAPY: Concurrent chemoradiation with weekly carboplatin for AUC of 2 and paclitaxel 45 Mg/M2.  First dose August 08, 2021.  Past medical history includes COPD, hyperlipidemia, hypertension.  Per RN, patient requires 6-8 L O2.  Medications include vitamin D3, Lasix, magnesium, Micro-K, and Compazine.  Labs include potassium 3.4, glucose 109, albumin 2.4, hemoglobin 6.4.  Height: 5 feet 10 inches. Weight: 176 pounds August 30. Usual body weight: 214 pounds in March 2022. BMI: 25.25. ECOG: 1.  Patient reports little to no appetite.  Reports it is hard to think about what she wants to eat when she is asked. She lives with her son and his family.  She also has help from her sister. Dietary recall reveals patient eats grits or eggs for breakfast, yogurt or banana for lunch, and chicken or fish with cabbage for dinner.  She tries to drink between 1 and 2 cartons of original strawberry Ensure. Reports she had a stool today. Reports thickened saliva. Nutrition focused physical exam was deferred.  Nutrition diagnosis: Unintended weight loss related to lung cancer as evidenced by 18% weight loss in less than 6 months which is significant.  Intervention: Educated on importance of smaller more frequent meals and snacks with high-calorie, high-protein foods. Increase Ensure to Ensure Plus or equivalent 3 times daily.  Reminded coupons. Provided nutrition facts sheets on high-protein snacks. Educated on strategies to have foods available to patient throughout the day while her family is at work. Encouraged bowel regimen. Encouraged baking soda and salt water rinses for thickened saliva.  Increase fluids. Questions answered.  Teach back method used.  Contact information  provided.  Monitoring, evaluation, goals: Patient will tolerate increased calories and protein to minimize further weight loss.  Next visit: Monday, October 3 during infusion.  **Disclaimer: This note was dictated with voice recognition software. Similar sounding words can inadvertently be transcribed and this note may contain transcription errors which may not have been corrected upon publication of note.**

## 2021-08-15 ENCOUNTER — Inpatient Hospital Stay: Payer: 59

## 2021-08-15 ENCOUNTER — Ambulatory Visit
Admission: RE | Admit: 2021-08-15 | Discharge: 2021-08-15 | Disposition: A | Discharge status: Home / Self Care | Payer: 59 | Source: Ambulatory Visit | Attending: Radiation Oncology | Admitting: Radiation Oncology

## 2021-08-15 DIAGNOSIS — T451X5A Adverse effect of antineoplastic and immunosuppressive drugs, initial encounter: Secondary | ICD-10-CM

## 2021-08-15 DIAGNOSIS — Z5111 Encounter for antineoplastic chemotherapy: Secondary | ICD-10-CM | POA: Diagnosis not present

## 2021-08-15 DIAGNOSIS — D6481 Anemia due to antineoplastic chemotherapy: Secondary | ICD-10-CM

## 2021-08-15 LAB — PREPARE RBC (CROSSMATCH)

## 2021-08-15 MED ORDER — SODIUM CHLORIDE 0.9% IV SOLUTION
250.0000 mL | Freq: Once | INTRAVENOUS | Status: AC
  Administered 2021-08-15: 250 mL via INTRAVENOUS

## 2021-08-15 MED ORDER — DIPHENHYDRAMINE HCL 25 MG PO CAPS
25.0000 mg | ORAL_CAPSULE | Freq: Once | ORAL | Status: AC
  Administered 2021-08-15: 25 mg via ORAL
  Filled 2021-08-15: qty 1

## 2021-08-15 MED ORDER — ACETAMINOPHEN 325 MG PO TABS
650.0000 mg | ORAL_TABLET | Freq: Once | ORAL | Status: AC
  Administered 2021-08-15: 650 mg via ORAL
  Filled 2021-08-15: qty 2

## 2021-08-15 NOTE — Patient Instructions (Signed)
Blood Transfusion, Adult, Care After This sheet gives you information about how to care for yourself after your procedure. Your doctor may also give you more specific instructions. If you have problems or questions, contact your doctor. What can I expect after the procedure? After the procedure, it is common to have: Bruising and soreness at the IV site. A headache. Follow these instructions at home: Insertion site care   Follow instructions from your doctor about how to take care of your insertion site. This is where an IV tube was put into your vein. Make sure you: Wash your hands with soap and water before and after you change your bandage (dressing). If you cannot use soap and water, use hand sanitizer. Change your bandage as told by your doctor. Check your insertion site every day for signs of infection. Check for: Redness, swelling, or pain. Bleeding from the site. Warmth. Pus or a bad smell. General instructions Take over-the-counter and prescription medicines only as told by your doctor. Rest as told by your doctor. Go back to your normal activities as told by your doctor. Keep all follow-up visits as told by your doctor. This is important. Contact a doctor if: You have itching or red, swollen areas of skin (hives). You feel worried or nervous (anxious). You feel weak after doing your normal activities. You have redness, swelling, warmth, or pain around the insertion site. You have blood coming from the insertion site, and the blood does not stop with pressure. You have pus or a bad smell coming from the insertion site. Get help right away if: You have signs of a serious reaction. This may be coming from an allergy or the body's defense system (immune system). Signs include: Trouble breathing or shortness of breath. Swelling of the face or feeling warm (flushed). Fever or chills. Head, chest, or back pain. Dark pee (urine) or blood in the pee. Widespread rash. Fast  heartbeat. Feeling dizzy or light-headed. You may receive your blood transfusion in an outpatient setting. If so, you will be told whom to contact to report any reactions. These symptoms may be an emergency. Do not wait to see if the symptoms will go away. Get medical help right away. Call your local emergency services (911 in the [place] Do not drive yourself to the hospital. Summary Bruising and soreness at the IV site are common. Check your insertion site every day for signs of infection. Rest as told by your doctor. Go back to your normal activities as told by your doctor. Get help right away if you have signs of a serious reaction. This information is not intended to replace advice given to you by your health care provider. Make sure you discuss any questions you have with your health care provider. Document Revised: 03/09/2021 Document Reviewed: 05/07/2019 Elsevier Patient Education  [place].

## 2021-08-16 ENCOUNTER — Ambulatory Visit
Admission: RE | Admit: 2021-08-16 | Discharge: 2021-08-16 | Disposition: A | Discharge status: Home / Self Care | Payer: 59 | Source: Ambulatory Visit | Attending: Radiation Oncology | Admitting: Radiation Oncology

## 2021-08-16 ENCOUNTER — Other Ambulatory Visit: Payer: Self-pay

## 2021-08-16 DIAGNOSIS — Z5111 Encounter for antineoplastic chemotherapy: Secondary | ICD-10-CM | POA: Diagnosis not present

## 2021-08-16 LAB — TYPE AND SCREEN
ABO/RH(D): O POS
Antibody Screen: NEGATIVE
Unit division: 0
Unit division: 0

## 2021-08-16 LAB — BPAM RBC
Blood Product Expiration Date: 202210212359
Blood Product Expiration Date: 202210212359
ISSUE DATE / TIME: 202209191509
ISSUE DATE / TIME: 202209200832
Unit Type and Rh: 5100
Unit Type and Rh: 5100

## 2021-08-17 ENCOUNTER — Ambulatory Visit
Admission: RE | Admit: 2021-08-17 | Discharge: 2021-08-17 | Disposition: A | Discharge status: Home / Self Care | Payer: 59 | Source: Ambulatory Visit | Attending: Radiation Oncology | Admitting: Radiation Oncology

## 2021-08-17 DIAGNOSIS — Z5111 Encounter for antineoplastic chemotherapy: Secondary | ICD-10-CM | POA: Diagnosis not present

## 2021-08-18 ENCOUNTER — Ambulatory Visit
Admission: RE | Admit: 2021-08-18 | Discharge: 2021-08-18 | Disposition: A | Discharge status: Home / Self Care | Payer: 59 | Source: Ambulatory Visit | Attending: Radiation Oncology | Admitting: Radiation Oncology

## 2021-08-18 ENCOUNTER — Other Ambulatory Visit: Payer: Self-pay

## 2021-08-18 DIAGNOSIS — Z5111 Encounter for antineoplastic chemotherapy: Secondary | ICD-10-CM | POA: Diagnosis not present

## 2021-08-18 MED FILL — Dexamethasone Sodium Phosphate Inj 100 MG/10ML: INTRAMUSCULAR | Qty: 1 | Status: AC

## 2021-08-21 ENCOUNTER — Ambulatory Visit
Admission: RE | Admit: 2021-08-21 | Discharge: 2021-08-21 | Disposition: A | Discharge status: Home / Self Care | Payer: 59 | Source: Ambulatory Visit | Attending: Radiation Oncology | Admitting: Radiation Oncology

## 2021-08-21 ENCOUNTER — Inpatient Hospital Stay: Payer: 59

## 2021-08-21 ENCOUNTER — Telehealth: Payer: Self-pay | Admitting: Medical Oncology

## 2021-08-21 ENCOUNTER — Inpatient Hospital Stay (HOSPITAL_BASED_OUTPATIENT_CLINIC_OR_DEPARTMENT_OTHER): Payer: 59 | Admitting: Internal Medicine

## 2021-08-21 ENCOUNTER — Other Ambulatory Visit: Payer: Self-pay

## 2021-08-21 ENCOUNTER — Encounter: Payer: Self-pay | Admitting: Internal Medicine

## 2021-08-21 VITALS — BP 113/58 | HR 117 | Temp 96.8°F | Resp 17

## 2021-08-21 VITALS — HR 112

## 2021-08-21 DIAGNOSIS — D509 Iron deficiency anemia, unspecified: Secondary | ICD-10-CM

## 2021-08-21 DIAGNOSIS — C3492 Malignant neoplasm of unspecified part of left bronchus or lung: Secondary | ICD-10-CM

## 2021-08-21 DIAGNOSIS — Z5111 Encounter for antineoplastic chemotherapy: Secondary | ICD-10-CM

## 2021-08-21 DIAGNOSIS — G893 Neoplasm related pain (acute) (chronic): Secondary | ICD-10-CM | POA: Diagnosis not present

## 2021-08-21 LAB — CBC WITH DIFFERENTIAL (CANCER CENTER ONLY)
Abs Immature Granulocytes: 0.06 10*3/uL (ref 0.00–0.07)
Basophils Absolute: 0 10*3/uL (ref 0.0–0.1)
Basophils Relative: 0 %
Eosinophils Absolute: 0 10*3/uL (ref 0.0–0.5)
Eosinophils Relative: 1 %
HCT: 26.6 % — ABNORMAL LOW (ref 36.0–46.0)
Hemoglobin: 7.8 g/dL — ABNORMAL LOW (ref 12.0–15.0)
Immature Granulocytes: 1 %
Lymphocytes Relative: 5 %
Lymphs Abs: 0.3 10*3/uL — ABNORMAL LOW (ref 0.7–4.0)
MCH: 23.4 pg — ABNORMAL LOW (ref 26.0–34.0)
MCHC: 29.3 g/dL — ABNORMAL LOW (ref 30.0–36.0)
MCV: 79.6 fL — ABNORMAL LOW (ref 80.0–100.0)
Monocytes Absolute: 0.4 10*3/uL (ref 0.1–1.0)
Monocytes Relative: 7 %
Neutro Abs: 4.8 10*3/uL (ref 1.7–7.7)
Neutrophils Relative %: 86 %
Platelet Count: 227 10*3/uL (ref 150–400)
RBC: 3.34 MIL/uL — ABNORMAL LOW (ref 3.87–5.11)
RDW: 19.4 % — ABNORMAL HIGH (ref 11.5–15.5)
WBC Count: 5.5 10*3/uL (ref 4.0–10.5)
nRBC: 0 % (ref 0.0–0.2)

## 2021-08-21 LAB — CMP (CANCER CENTER ONLY)
ALT: 6 U/L (ref 0–44)
AST: 15 U/L (ref 15–41)
Albumin: 2.3 g/dL — ABNORMAL LOW (ref 3.5–5.0)
Alkaline Phosphatase: 69 U/L (ref 38–126)
Anion gap: 14 (ref 5–15)
BUN: 23 mg/dL (ref 8–23)
CO2: 42 mmol/L — ABNORMAL HIGH (ref 22–32)
Calcium: 9.5 mg/dL (ref 8.9–10.3)
Chloride: 80 mmol/L — ABNORMAL LOW (ref 98–111)
Creatinine: 0.74 mg/dL (ref 0.44–1.00)
GFR, Estimated: >60 mL/min (ref >60)
Glucose, Bld: 99 mg/dL (ref 70–99)
Potassium: 3.1 mmol/L — ABNORMAL LOW (ref 3.5–5.1)
Sodium: 136 mmol/L (ref 135–145)
Total Bilirubin: 0.5 mg/dL (ref 0.3–1.2)
Total Protein: 6.9 g/dL (ref 6.5–8.1)

## 2021-08-21 MED ORDER — SODIUM CHLORIDE 0.9 % IV SOLN
10.0000 mg | Freq: Once | INTRAVENOUS | Status: AC
  Administered 2021-08-21: 10 mg via INTRAVENOUS
  Filled 2021-08-21: qty 10

## 2021-08-21 MED ORDER — SODIUM CHLORIDE 0.9 % IV SOLN
213.2000 mg | Freq: Once | INTRAVENOUS | Status: AC
  Administered 2021-08-21: 210 mg via INTRAVENOUS
  Filled 2021-08-21: qty 21

## 2021-08-21 MED ORDER — FAMOTIDINE 20 MG IN NS 100 ML IVPB
20.0000 mg | Freq: Once | INTRAVENOUS | Status: AC
  Administered 2021-08-21: 20 mg via INTRAVENOUS
  Filled 2021-08-21: qty 100

## 2021-08-21 MED ORDER — SODIUM CHLORIDE 0.9 % IV SOLN
45.0000 mg/m2 | Freq: Once | INTRAVENOUS | Status: AC
  Administered 2021-08-21: 90 mg via INTRAVENOUS
  Filled 2021-08-21: qty 15

## 2021-08-21 MED ORDER — SODIUM CHLORIDE 0.9 % IV SOLN: Freq: Once | INTRAVENOUS | Status: AC

## 2021-08-21 MED ORDER — DIPHENHYDRAMINE HCL 50 MG/ML IJ SOLN
50.0000 mg | Freq: Once | INTRAMUSCULAR | Status: AC
  Administered 2021-08-21: 50 mg via INTRAVENOUS
  Filled 2021-08-21: qty 1

## 2021-08-21 MED ORDER — PALONOSETRON HCL INJECTION 0.25 MG/5ML
0.2500 mg | Freq: Once | INTRAVENOUS | Status: AC
  Administered 2021-08-21: 0.25 mg via INTRAVENOUS
  Filled 2021-08-21: qty 5

## 2021-08-21 NOTE — Telephone Encounter (Signed)
Pt reports her feet are very painful. It feels like she is walking on "jack rocks".  10/13 appt at 1120 with PCP, Dr Burnett Sheng , to evaluate her feet. Pt aware.

## 2021-08-21 NOTE — Progress Notes (Signed)
[facility] Telephone:(336) [phone]   Fax:(336) [phone]  OFFICE PROGRESS NOTE  Greig Right, MD [address] [place] [address]  DIAGNOSIS: Stage IIIA (T4, N1, M0) non-small cell lung cancer, adenocarcinoma presented with large left upper lobe lung mass with chest wall invasion in addition to left suprahilar lymphadenopathy diagnosed in August 2022.  Biomarker Findings Tumor Mutational Burden - 24 Muts/Mb Microsatellite status - MS-Stable Genomic Findings For a complete list of the genes assayed, please refer to the Appendix. KRAS G12C ERBB2 amplification - equivocal? ARID1A [id number]*1 RICTOR amplification STK11 [id number] EPHA3 amplification - equivocal? FGF10amplification - equivocal? LYN amplification NFKBIA amplification - equivocal? NKX2-1 amplification - equivocal? SMAD2 Q116* SMARCA4 rearrangement intron 4 TP53 V157F 6 Disease relevant genes with no reportable alterations: ALK, BRAF, EGFR, MET, RET, ROS1  PDL1 Expression 0%  PRIOR THERAPY: None  CURRENT THERAPY: Concurrent chemoradiation with weekly carboplatin for AUC of 2 and paclitaxel 45 Mg/M2.  First dose August 08, 2021.  Status post 2 cycles.  INTERVAL HISTORY: Patricia KUCHENBECKER65.o. female returns to the clinic today for follow-up visit accompanied by her husband.  The patient is feeling fine today except for the baseline fatigue and weakness secondary to persistent iron deficiency anemia.  She did not have her screening colonoscopy as planned because of concern about anesthesia.  She has baseline shortness of breath and currently on home oxygen with occasional chest pain secondary to cough.  She denied having any hemoptysis.  She denied having any recent weight loss or night sweats.  She has no nausea, vomiting, diarrhea or constipation.  She has no headache or visual changes.  She is tolerating her course of concurrent chemoradiation fairly well.  She is here  today for evaluation before starting cycle #3.  MEDICAL HISTORY: Past Medical History:  Diagnosis Date   Angioedema    COPD (chronic obstructive pulmonary disease) (HCC)    FHx: migraine headaches    Hyperlipidemia    Hypertension    Seasonal allergies     ALLERGIES:  is allergic to dyazide [hydrochlorothiazide w-triamterene] and zestoretic [lisinopril-hydrochlorothiazide].  MEDICATIONS:  Current Outpatient Medications  Medication Sig Dispense Refill   albuterol (VENTOLIN HFA) 108 (90 Base) MCG/ACT inhaler Inhale 2 puffs into the lungs every 4 (four) hours as needed for wheezing or shortness of breath. (Patient not taking: No sig reported) 18 g 5   albuterol (VENTOLIN HFA) 108 (90 Base) MCG/ACT inhaler Inhale 2 puffs into the lungs every 6 (six) hours as needed for wheezing or shortness of breath. (Patient not taking: No sig reported) 8 g 6   amitriptyline (ELAVIL) 10 MG tablet Take 10 mg by mouth daily.     amLODipine (NORVASC) 2.5 MG tablet Take 2.5 mg by mouth daily.     chlorthalidone (HYGROTON) 25 MG tablet Take 50 mg by mouth daily.      cholecalciferol (VITAMIN D3) 25 MCG (1000 UNIT) tablet Take 1,000 Units by mouth daily.     furosemide (LASIX) 20 MG tablet Take 20 mg by mouth daily.     Magnesium 250 MG TABS Take 250 mg by mouth daily.     nystatin (MYCOSTATIN) 100000 UNIT/ML suspension Take 5 mLs (500,000 Units total) by mouth 4 (four) times daily. 60 mL 0   oxyCODONE-acetaminophen (PERCOCET/ROXICET) 5-325 MG tablet Take 1 tablet by mouth every 6 (six) hours as needed for severe pain. 30 tablet 0   potassium chloride (MICRO-K) 10 MEQ CR capsule Take  10 mEq by mouth daily.     prochlorperazine (COMPAZINE) 10 MG tablet Take 1 tablet (10 mg total) by mouth every 6 (six) hours as needed for nausea or vomiting. 30 tablet 0   rosuvastatin (CRESTOR) 10 MG tablet Take 10 mg by mouth daily.     SYMBICORT 160-4.5 MCG/ACT inhaler TAKE 2 PUFFS BY MOUTH TWICE A DAY 10.2 each 5    Tiotropium Bromide Monohydrate (SPIRIVA RESPIMAT) 2.5 MCG/ACT AERS Inhale 2 puffs into the lungs daily. 4 g 5   No current facility-administered medications for this visit.    SURGICAL HISTORY: No past surgical history on file.  REVIEW OF SYSTEMS:  Constitutional: positive for fatigue Eyes: negative Ears, nose, mouth, throat, and face: negative Respiratory: positive for cough and dyspnea on exertion Cardiovascular: negative Gastrointestinal: negative Genitourinary:negative Integument/breast: negative Hematologic/lymphatic: negative Musculoskeletal:negative Neurological: negative Behavioral/Psych: negative Endocrine: negative Allergic/Immunologic: negative   PHYSICAL EXAMINATION: General appearance: alert, cooperative, fatigued, and no distress Head: Normocephalic, without obvious abnormality, atraumatic Neck: no adenopathy, no JVD, supple, symmetrical, trachea midline, and thyroid not enlarged, symmetric, no tenderness/mass/nodules Lymph nodes: Cervical, supraclavicular, and axillary nodes normal. Resp: clear to auscultation bilaterally Back: symmetric, no curvature. ROM normal. No CVA tenderness. Cardio: regular rate and rhythm, S1, S2 normal, no murmur, click, rub or gallop GI: soft, non-tender; bowel sounds normal; no masses,  no organomegaly Extremities: extremities normal, atraumatic, no cyanosis or edema Neurologic: Alert and oriented X 3, normal strength and tone. Normal symmetric reflexes. Normal coordination and gait  ECOG PERFORMANCE STATUS: 1 - Symptomatic but completely ambulatory  Blood pressure (!) 113/58, pulse (!) 117, temperature (!) 96.8 F (36 C), temperature source Oral, resp. rate 17, SpO2 100 %.  LABORATORY DATA: Lab Results  Component Value Date   WBC 5.5 08/21/2021   HGB 7.8 (L) 08/21/2021   HCT 26.6 (L) 08/21/2021   MCV 79.6 (L) 08/21/2021   PLT 227 08/21/2021      Chemistry      Component Value Date/Time   NA 135 08/14/2021 1037   K 3.4  (L) 08/14/2021 1037   CL 82 (L) 08/14/2021 1037   CO2 41 (H) 08/14/2021 1037   BUN 20 08/14/2021 1037   CREATININE 0.77 08/14/2021 1037      Component Value Date/Time   CALCIUM 9.9 08/14/2021 1037   ALKPHOS 77 08/14/2021 1037   AST 15 08/14/2021 1037   ALT 6 08/14/2021 1037   BILITOT 0.4 08/14/2021 1037       RADIOGRAPHIC STUDIES: MR BRAIN W WO CONTRAST  Result Date: 07/26/2021 CLINICAL DATA:  Malignant neoplasm of unspecified part of unspecified bronchus or lung (HCC) C34.90 (ICD-10-CM). Non-small-cell lung cancer, staging. EXAM: MRI HEAD WITHOUT AND WITH CONTRAST TECHNIQUE: Multiplanar, multiecho pulse sequences of the brain and surrounding structures were obtained without and with intravenous contrast. CONTRAST:  37m GADAVIST GADOBUTROL 1 MMOL/ML IV SOLN COMPARISON:  PET-CT 06/23/2021. FINDINGS: Brain: Mild intermittent motion degradation. Cerebral volume is normal. Mild multifocal T2/FLAIR hyperintensity within the cerebral white matter, nonspecific but compatible with chronic small vessel ischemic disease. Punctate chronic microhemorrhage within the mid left frontal lobe. There is no acute infarct. No evidence of an intracranial mass. No extra-axial fluid collection. No midline shift. No pathologic intracranial enhancement is demonstrated to suggest intracranial metastatic disease. Vascular: Maintained flow voids within the proximal large arterial vessels. Skull and upper cervical spine: No focal suspicious marrow lesion. Susceptibility artifact arising from cervical spinal fusion hardware. Sinuses/Orbits: Visualized orbits show no acute finding. No significant paranasal sinus  disease. IMPRESSION: Mild intermittent motion degradation. No evidence of intracranial metastatic disease. Mild chronic small-vessel ischemic changes within cerebral white matter. Electronically Signed   By: Kellie Simmering D.O.   On: 07/26/2021 09:52    ASSESSMENT AND PLAN: This is a very pleasant 65 years old  African-American female recently diagnosed with a stage IIIa (T4, N1, M0) non-small cell lung cancer, adenocarcinoma presented with large left upper lobe lung mass with chest wall invasion in addition to left suprahilar adenopathy diagnosed in August 2022 and the molecular study are positive for KRAS G12C mutation with negative PD-L1 expression. The patient is currently undergoing a course of concurrent chemoradiation with weekly carboplatin for AUC of 2 and paclitaxel 45 Mg/M2.  Status post 2 cycles. The patient is tolerating her treatment well except for fatigue. I recommended for her to proceed with cycle #3 today as planned. For the persistent anemia, I will arrange for the patient to receive 1 unit of PRBCs transfusion this week. We will also check her stool for Hemoccult. I will see the patient back for follow-up visit in 2 weeks for evaluation before starting cycle #5. For the peripheral neuropathy, she is currently taking Tylenol but she has prescription for Percocet if needed. She was advised to call immediately if she has any other concerning symptoms in the interval. The patient voices understanding of current disease status and treatment options and is in agreement with the current care plan.  All questions were answered. The patient knows to call the clinic with any problems, questions or concerns. We can certainly see the patient much sooner if necessary.   Disclaimer: This note was dictated with voice recognition software. Similar sounding words can inadvertently be transcribed and may not be corrected upon review.

## 2021-08-21 NOTE — Patient Instructions (Addendum)
Foot Pain-You have an appointment with Dr Burnett Sheng on October 13 at 1120 to  evaluate your foot pain. Please arrive at 1100.    [facility]   Discharge Instructions: Thank you for choosing [facility] to provide your oncology and hematology care.   If you have a lab appointment with the [facility], please go directly to the [facility] and check in at the registration area.   Wear comfortable clothing and clothing appropriate for easy access to any Portacath or PICC line.   We strive to give you quality time with your provider. You may need to reschedule your appointment if you arrive late (15 or more minutes).  Arriving late affects you and other patients whose appointments are after yours.  Also, if you miss three or more appointments without notifying the office, you may be dismissed from the clinic at the provider's discretion.      For prescription refill requests, have your pharmacy contact our office and allow 72 hours for refills to be completed.    Today you received the following chemotherapy and/or immunotherapy agents: Paclitaxel (Taxol) and Carboplatin      To help prevent nausea and vomiting after your treatment, we encourage you to take your nausea medication as directed.  BELOW ARE SYMPTOMS THAT SHOULD BE REPORTED IMMEDIATELY: *FEVER GREATER THAN 100.4 F (38 C) OR HIGHER *CHILLS OR SWEATING *NAUSEA AND VOMITING THAT IS NOT CONTROLLED WITH YOUR NAUSEA MEDICATION *UNUSUAL SHORTNESS OF BREATH *UNUSUAL BRUISING OR BLEEDING *URINARY PROBLEMS (pain or burning when urinating, or frequent urination) *BOWEL PROBLEMS (unusual diarrhea, constipation, pain near the anus) TENDERNESS IN MOUTH AND THROAT WITH OR WITHOUT PRESENCE OF ULCERS (sore throat, sores in mouth, or a toothache) UNUSUAL RASH, SWELLING OR PAIN  UNUSUAL VAGINAL DISCHARGE OR ITCHING   Items with * indicate a potential emergency and should be followed up as soon as  possible or go to the Emergency Department if any problems should occur.  Please show the CHEMOTHERAPY ALERT CARD or IMMUNOTHERAPY ALERT CARD at check-in to the Emergency Department and triage nurse.  Should you have questions after your visit or need to cancel or reschedule your appointment, please contact [facility]  Dept: [phone]  and follow the prompts.  Office hours are 8:00 a.m. to 4:30 p.m. Monday - Friday. Please note that voicemails left after 4:00 p.m. may not be returned until the following business day.  We are closed weekends and major holidays. You have access to a nurse at all times for urgent questions. Please call the main number to the clinic Dept: [phone] and follow the prompts.   For any non-urgent questions, you may also contact your provider using MyChart. We now offer e-Visits for anyone 59 and older to request care online for non-urgent symptoms. For details visit [email or web address].   Also download the MyChart app! Go to the app store, search "MyChart", open the app, select [facility], and log in with your MyChart username and password.  Due to Covid, a mask is required upon entering the hospital/clinic. If you do not have a mask, one will be given to you upon arrival. For doctor visits, patients may have 1 support person aged 46 or older with them. For treatment visits, patients cannot have anyone with them due to current Covid guidelines and our immunocompromised population.

## 2021-08-21 NOTE — Progress Notes (Signed)
Per Dr Julien Nordmann ,it is ok to treat pt today with Carboplatin and taxol and heart rate of 117.

## 2021-08-22 ENCOUNTER — Telehealth: Payer: Self-pay | Admitting: Internal Medicine

## 2021-08-22 ENCOUNTER — Ambulatory Visit
Admission: RE | Admit: 2021-08-22 | Discharge: 2021-08-22 | Disposition: A | Discharge status: Home / Self Care | Payer: 59 | Source: Ambulatory Visit | Attending: Radiation Oncology | Admitting: Radiation Oncology

## 2021-08-22 DIAGNOSIS — Z5111 Encounter for antineoplastic chemotherapy: Secondary | ICD-10-CM | POA: Diagnosis not present

## 2021-08-22 NOTE — Telephone Encounter (Signed)
Scheduled appt. Called and left msg

## 2021-08-23 ENCOUNTER — Ambulatory Visit
Admission: RE | Admit: 2021-08-23 | Discharge: 2021-08-23 | Disposition: A | Discharge status: Home / Self Care | Payer: 59 | Source: Ambulatory Visit | Attending: Radiation Oncology | Admitting: Radiation Oncology

## 2021-08-23 DIAGNOSIS — Z5111 Encounter for antineoplastic chemotherapy: Secondary | ICD-10-CM | POA: Diagnosis not present

## 2021-08-24 ENCOUNTER — Inpatient Hospital Stay: Payer: 59

## 2021-08-24 ENCOUNTER — Other Ambulatory Visit: Payer: Self-pay | Admitting: Medical Oncology

## 2021-08-24 ENCOUNTER — Ambulatory Visit
Admission: RE | Admit: 2021-08-24 | Discharge: 2021-08-24 | Disposition: A | Discharge status: Home / Self Care | Payer: 59 | Source: Ambulatory Visit | Attending: Radiation Oncology | Admitting: Radiation Oncology

## 2021-08-24 ENCOUNTER — Other Ambulatory Visit: Payer: Self-pay

## 2021-08-24 DIAGNOSIS — D6481 Anemia due to antineoplastic chemotherapy: Secondary | ICD-10-CM

## 2021-08-24 DIAGNOSIS — Z5111 Encounter for antineoplastic chemotherapy: Secondary | ICD-10-CM | POA: Diagnosis not present

## 2021-08-24 DIAGNOSIS — T451X5A Adverse effect of antineoplastic and immunosuppressive drugs, initial encounter: Secondary | ICD-10-CM

## 2021-08-24 LAB — CBC WITH DIFFERENTIAL (CANCER CENTER ONLY)
Abs Immature Granulocytes: 0.03 10*3/uL (ref 0.00–0.07)
Basophils Absolute: 0 10*3/uL (ref 0.0–0.1)
Basophils Relative: 0 %
Eosinophils Absolute: 0 10*3/uL (ref 0.0–0.5)
Eosinophils Relative: 0 %
HCT: 24.6 % — ABNORMAL LOW (ref 36.0–46.0)
Hemoglobin: 7.1 g/dL — ABNORMAL LOW (ref 12.0–15.0)
Immature Granulocytes: 1 %
Lymphocytes Relative: 3 %
Lymphs Abs: 0.2 10*3/uL — ABNORMAL LOW (ref 0.7–4.0)
MCH: 23.1 pg — ABNORMAL LOW (ref 26.0–34.0)
MCHC: 28.9 g/dL — ABNORMAL LOW (ref 30.0–36.0)
MCV: 80.1 fL (ref 80.0–100.0)
Monocytes Absolute: 0.2 10*3/uL (ref 0.1–1.0)
Monocytes Relative: 4 %
Neutro Abs: 6 10*3/uL (ref 1.7–7.7)
Neutrophils Relative %: 92 %
Platelet Count: 205 10*3/uL (ref 150–400)
RBC: 3.07 MIL/uL — ABNORMAL LOW (ref 3.87–5.11)
RDW: 19.7 % — ABNORMAL HIGH (ref 11.5–15.5)
WBC Count: 6.5 10*3/uL (ref 4.0–10.5)
nRBC: 0 % (ref 0.0–0.2)

## 2021-08-24 LAB — PREPARE RBC (CROSSMATCH)

## 2021-08-24 MED ORDER — SODIUM CHLORIDE 0.9% IV SOLUTION
250.0000 mL | Freq: Once | INTRAVENOUS | Status: AC
  Administered 2021-08-24: 250 mL via INTRAVENOUS

## 2021-08-24 MED ORDER — ACETAMINOPHEN 325 MG PO TABS
650.0000 mg | ORAL_TABLET | Freq: Once | ORAL | Status: AC
  Administered 2021-08-24: 650 mg via ORAL
  Filled 2021-08-24: qty 2

## 2021-08-24 MED ORDER — DIPHENHYDRAMINE HCL 25 MG PO CAPS
25.0000 mg | ORAL_CAPSULE | Freq: Once | ORAL | Status: AC
  Administered 2021-08-24: 25 mg via ORAL
  Filled 2021-08-24: qty 1

## 2021-08-24 NOTE — Progress Notes (Signed)
Prior to blood transfusion, pt c/o shortness of breath, fatigue, and generalized weakness. SBP in 90's and HR in 110's. Discussed going to ED and patient states understanding but refused to go to ED. Stated "I have full time support and help at home. Hospitals are for sick people."

## 2021-08-24 NOTE — Progress Notes (Signed)
Blood orders entered.

## 2021-08-24 NOTE — Patient Instructions (Addendum)
Patricia Sampson your potassium on Monday was low at 3.1. This is called hypokalemia. To treat this , Dr Julien Nordmann wants you to  eat more foods that contain a lot of potassium.  This includes: Nuts, such as peanuts and pistachios. Seeds, such as sunflower seeds and pumpkin seeds. Peas, lentils, and lima beans. Whole grain and bran cereals and breads. Fresh fruits and vegetables, such as apricots, avocado, bananas, cantaloupe, kiwi, oranges, tomatoes, asparagus, and potatoes. Orange juice. Tomato juice. Red meats.    Hypokalemia Hypokalemia means that the amount of potassium in the blood is lower than normal. Potassium is a chemical (electrolyte) that helps regulate the amount of fluid in the body. It also stimulates muscle tightening (contraction) and helps nerves work properly. Normally, most of the body's potassium is inside cells, and only a very small amount is in the blood. Because the amount in the blood is so small, minor changes to potassium levels in the blood can be life-threatening. What are the causes? This condition may be caused by: Antibiotic medicine. Diarrhea or vomiting. Taking too much of a medicine that helps you have a bowel movement (laxative) can cause diarrhea and lead to hypokalemia. Chronic kidney disease (CKD). Medicines that help the body get rid of excess fluid (diuretics). Eating disorders, such as bulimia. Low magnesium levels in the body. Sweating a lot. What are the signs or symptoms? Symptoms of this condition include: Weakness. Constipation. Fatigue. Muscle cramps. Mental confusion. Skipped heartbeats or irregular heartbeat (palpitations). Tingling or numbness. How is this diagnosed? This condition is diagnosed with a blood test. How is this treated? This condition may be treated by: Taking potassium supplements by mouth. Adjusting the medicines that you take. Eating more foods that contain a lot of potassium. Follow these instructions at  home:  Contact a health care provider if you: Have weakness that gets worse. Feel your heart pounding or racing. Vomit. Have diarrhea. Have diabetes (diabetes mellitus) and you have trouble keeping your blood sugar (glucose) in your target range. Get help right away if you: Have chest pain. Have shortness of breath. Have vomiting or diarrhea that lasts for more than 2 days. Faint. Summary Hypokalemia means that the amount of potassium in the blood is lower than normal. This condition is diagnosed with a blood test. Hypokalemia may be treated by taking potassium supplements, adjusting the medicines that you take, or eating more foods that are high in potassium. If your potassium level is very low, you may need to get potassium through an IV and be monitored in the hospital. This information is not intended to replace advice given to you by your health care provider. Make sure you discuss any questions you have with your health care provider.  Blood Transfusion, Adult, Care After This sheet gives you information about how to care for yourself after your procedure. Your health care provider may also give you more specific instructions. If you have problems or questions, contact your health care provider. What can I expect after the procedure? After the procedure, it is common to have: Bruising and soreness where the IV was inserted. A headache. Follow these instructions at home: IV insertion site care   Follow instructions from your health care provider about how to take care of your IV insertion site. Make sure you: Wash your hands with soap and water before and after you change your bandage (dressing). If soap and water are not available, use hand sanitizer. Change your dressing as told by your health  care provider. Check your IV insertion site every day for signs of infection. Check for: Redness, swelling, or pain. Bleeding from the site. Warmth. Pus or a bad smell. General  instructions Take over-the-counter and prescription medicines only as told by your health care provider. Rest as told by your health care provider. Return to your normal activities as told by your health care provider. Keep all follow-up visits as told by your health care provider. This is important. Contact a health care provider if: You have itching or red, swollen areas of skin (hives). You feel anxious. You feel weak after doing your normal activities. You have redness, swelling, warmth, or pain around the IV insertion site. You have blood coming from the IV insertion site that does not stop with pressure. You have pus or a bad smell coming from your IV insertion site. Get help right away if: You have symptoms of a serious allergic or immune system reaction, including: Trouble breathing or shortness of breath. Swelling of the face or feeling flushed. Fever or chills. Pain in the head, back, or chest. Dark urine or blood in the urine. Widespread rash. Fast heartbeat. Feeling dizzy or light-headed. If you receive your blood transfusion in an outpatient setting, you will be told whom to contact to report any reactions. These symptoms may represent a serious problem that is an emergency. Do not wait to see if the symptoms will go away. Get medical help right away. Call your local emergency services (911 in the [place] Do not drive yourself to the hospital. Summary Bruising and tenderness around the IV insertion site are common. Check your IV insertion site every day for signs of infection. Rest as told by your health care provider. Return to your normal activities as told by your health care provider. Get help right away for symptoms of a serious allergic or immune system reaction to blood transfusion. This information is not intended to replace advice given to you by your health care provider. Make sure you discuss any questions you have with your health care provider. Document Revised:  03/09/2021 Document Reviewed: 05/07/2019 Elsevier Patient Education  [place].

## 2021-08-24 NOTE — Progress Notes (Signed)
Son bringing pt in for lab appt today before blood transfusion

## 2021-08-25 ENCOUNTER — Ambulatory Visit
Admission: RE | Admit: 2021-08-25 | Discharge: 2021-08-25 | Disposition: A | Discharge status: Home / Self Care | Payer: 59 | Source: Ambulatory Visit | Attending: Radiation Oncology | Admitting: Radiation Oncology

## 2021-08-25 DIAGNOSIS — Z5111 Encounter for antineoplastic chemotherapy: Secondary | ICD-10-CM | POA: Diagnosis not present

## 2021-08-25 LAB — TYPE AND SCREEN
ABO/RH(D): O POS
Antibody Screen: NEGATIVE
Unit division: 0

## 2021-08-25 LAB — BPAM RBC
Blood Product Expiration Date: 202210312359
ISSUE DATE / TIME: 202209291448
Unit Type and Rh: 5100

## 2021-08-25 MED FILL — Dexamethasone Sodium Phosphate Inj 100 MG/10ML: INTRAMUSCULAR | Qty: 1 | Status: AC

## 2021-08-27 ENCOUNTER — Telehealth: Payer: Self-pay | Admitting: Hematology

## 2021-08-27 NOTE — Telephone Encounter (Signed)
Pt called and complains of worsening left side chest pain. The pain is located on the left side where her lung cancer is, intermittent, worse when she takes deep breaths.  She rates at a 6 out of 10.  No exertional chest pain, her chronic dyspnea is unchanged.  No fever, or chills.  The pain has been going on for a while, but much worse since to 3 days ago.  She has Percocet at home, but has not been taking.  Her pain is likely related to her cancer and radiation, I encouraged her to take Percocet as needed for pain control.  She has appointment for chemo and radiation tomorrow, I will send a message to her primary oncologist Dr. Julien Nordmann to follow-up tomorrow.  Patient knows to go to emergency room if she has new symptoms, especially fever or worsening dyspnea.  She voiced good understanding and agrees with the plan.  Patricia Sampson  08/27/2021

## 2021-08-28 ENCOUNTER — Other Ambulatory Visit: Payer: Self-pay

## 2021-08-28 ENCOUNTER — Inpatient Hospital Stay: Payer: 59

## 2021-08-28 ENCOUNTER — Ambulatory Visit: Payer: 59

## 2021-08-28 ENCOUNTER — Other Ambulatory Visit: Payer: Self-pay | Admitting: Physician Assistant

## 2021-08-28 VITALS — BP 92/58 | HR 110 | Temp 98.6°F | Resp 18

## 2021-08-28 DIAGNOSIS — C3412 Malignant neoplasm of upper lobe, left bronchus or lung: Secondary | ICD-10-CM | POA: Insufficient documentation

## 2021-08-28 DIAGNOSIS — Z510 Encounter for antineoplastic radiation therapy: Secondary | ICD-10-CM | POA: Diagnosis present

## 2021-08-28 DIAGNOSIS — Z79899 Other long term (current) drug therapy: Secondary | ICD-10-CM | POA: Insufficient documentation

## 2021-08-28 DIAGNOSIS — Z5111 Encounter for antineoplastic chemotherapy: Secondary | ICD-10-CM | POA: Insufficient documentation

## 2021-08-28 DIAGNOSIS — C3492 Malignant neoplasm of unspecified part of left bronchus or lung: Secondary | ICD-10-CM

## 2021-08-28 DIAGNOSIS — D649 Anemia, unspecified: Secondary | ICD-10-CM | POA: Insufficient documentation

## 2021-08-28 DIAGNOSIS — I10 Essential (primary) hypertension: Secondary | ICD-10-CM | POA: Diagnosis not present

## 2021-08-28 LAB — CBC WITH DIFFERENTIAL (CANCER CENTER ONLY)
Abs Immature Granulocytes: 0.03 10*3/uL (ref 0.00–0.07)
Basophils Absolute: 0 10*3/uL (ref 0.0–0.1)
Basophils Relative: 0 %
Eosinophils Absolute: 0 10*3/uL (ref 0.0–0.5)
Eosinophils Relative: 0 %
HCT: 27.9 % — ABNORMAL LOW (ref 36.0–46.0)
Hemoglobin: 8.1 g/dL — ABNORMAL LOW (ref 12.0–15.0)
Immature Granulocytes: 1 %
Lymphocytes Relative: 5 %
Lymphs Abs: 0.2 10*3/uL — ABNORMAL LOW (ref 0.7–4.0)
MCH: 23.7 pg — ABNORMAL LOW (ref 26.0–34.0)
MCHC: 29 g/dL — ABNORMAL LOW (ref 30.0–36.0)
MCV: 81.6 fL (ref 80.0–100.0)
Monocytes Absolute: 0.2 10*3/uL (ref 0.1–1.0)
Monocytes Relative: 5 %
Neutro Abs: 4.3 10*3/uL (ref 1.7–7.7)
Neutrophils Relative %: 89 %
Platelet Count: 178 10*3/uL (ref 150–400)
RBC: 3.42 MIL/uL — ABNORMAL LOW (ref 3.87–5.11)
RDW: 18.8 % — ABNORMAL HIGH (ref 11.5–15.5)
WBC Count: 4.9 10*3/uL (ref 4.0–10.5)
nRBC: 0 % (ref 0.0–0.2)

## 2021-08-28 LAB — CMP (CANCER CENTER ONLY)
ALT: 7 U/L (ref 0–44)
AST: 11 U/L — ABNORMAL LOW (ref 15–41)
Albumin: 2.4 g/dL — ABNORMAL LOW (ref 3.5–5.0)
Alkaline Phosphatase: 70 U/L (ref 38–126)
Anion gap: 12 (ref 5–15)
BUN: 25 mg/dL — ABNORMAL HIGH (ref 8–23)
CO2: 43 mmol/L — ABNORMAL HIGH (ref 22–32)
Calcium: 9.3 mg/dL (ref 8.9–10.3)
Chloride: 81 mmol/L — ABNORMAL LOW (ref 98–111)
Creatinine: 0.88 mg/dL (ref 0.44–1.00)
GFR, Estimated: >60 mL/min (ref >60)
Glucose, Bld: 151 mg/dL — ABNORMAL HIGH (ref 70–99)
Potassium: 3.1 mmol/L — ABNORMAL LOW (ref 3.5–5.1)
Sodium: 136 mmol/L (ref 135–145)
Total Bilirubin: 0.3 mg/dL (ref 0.3–1.2)
Total Protein: 6.9 g/dL (ref 6.5–8.1)

## 2021-08-28 MED ORDER — POTASSIUM CHLORIDE CRYS ER 20 MEQ PO TBCR
20.0000 meq | EXTENDED_RELEASE_TABLET | Freq: Once | ORAL | Status: AC
  Administered 2021-08-28: 20 meq via ORAL
  Filled 2021-08-28: qty 1

## 2021-08-28 MED ORDER — SODIUM CHLORIDE 0.9 % IV SOLN
213.2000 mg | Freq: Once | INTRAVENOUS | Status: AC
  Administered 2021-08-28: 210 mg via INTRAVENOUS
  Filled 2021-08-28: qty 21

## 2021-08-28 MED ORDER — SODIUM CHLORIDE 0.9 % IV SOLN
10.0000 mg | Freq: Once | INTRAVENOUS | Status: AC
  Administered 2021-08-28: 10 mg via INTRAVENOUS
  Filled 2021-08-28: qty 10

## 2021-08-28 MED ORDER — SODIUM CHLORIDE 0.9 % IV SOLN
Freq: Once | INTRAVENOUS | Status: AC
Start: 2021-08-28 — End: 2021-08-28

## 2021-08-28 MED ORDER — DIPHENHYDRAMINE HCL 50 MG/ML IJ SOLN
50.0000 mg | Freq: Once | INTRAMUSCULAR | Status: AC
  Administered 2021-08-28: 50 mg via INTRAVENOUS
  Filled 2021-08-28: qty 1

## 2021-08-28 MED ORDER — PALONOSETRON HCL INJECTION 0.25 MG/5ML
0.2500 mg | Freq: Once | INTRAVENOUS | Status: AC
  Administered 2021-08-28: 0.25 mg via INTRAVENOUS
  Filled 2021-08-28: qty 5

## 2021-08-28 MED ORDER — SODIUM CHLORIDE 0.9 % IV SOLN
45.0000 mg/m2 | Freq: Once | INTRAVENOUS | Status: AC
  Administered 2021-08-28: 90 mg via INTRAVENOUS
  Filled 2021-08-28: qty 15

## 2021-08-28 MED ORDER — FAMOTIDINE 20 MG IN NS 100 ML IVPB
20.0000 mg | Freq: Once | INTRAVENOUS | Status: AC
  Administered 2021-08-28: 20 mg via INTRAVENOUS
  Filled 2021-08-28: qty 100

## 2021-08-28 NOTE — Progress Notes (Signed)
Hr 107-110.  Ok to treat per MD.

## 2021-08-28 NOTE — Patient Instructions (Signed)
[facility]  Discharge Instructions: Thank you for choosing [facility] to provide your oncology and hematology care.   If you have a lab appointment with the Cancer Center, please go directly to the Cancer Center and check in at the registration area.   Wear comfortable clothing and clothing appropriate for easy access to any Portacath or PICC line.   We strive to give you quality time with your provider. You may need to reschedule your appointment if you arrive late (15 or more minutes).  Arriving late affects you and other patients whose appointments are after yours.  Also, if you miss three or more appointments without notifying the office, you may be dismissed from the clinic at the provider's discretion.      For prescription refill requests, have your pharmacy contact our office and allow 72 hours for refills to be completed.    Today you received the following chemotherapy and/or immunotherapy agents Taxol and Carboplatin       To help prevent nausea and vomiting after your treatment, we encourage you to take your nausea medication as directed.  BELOW ARE SYMPTOMS THAT SHOULD BE REPORTED IMMEDIATELY: *FEVER GREATER THAN 100.4 F (38 C) OR HIGHER *CHILLS OR SWEATING *NAUSEA AND VOMITING THAT IS NOT CONTROLLED WITH YOUR NAUSEA MEDICATION *UNUSUAL SHORTNESS OF BREATH *UNUSUAL BRUISING OR BLEEDING *URINARY PROBLEMS (pain or burning when urinating, or frequent urination) *BOWEL PROBLEMS (unusual diarrhea, constipation, pain near the anus) TENDERNESS IN MOUTH AND THROAT WITH OR WITHOUT PRESENCE OF ULCERS (sore throat, sores in mouth, or a toothache) UNUSUAL RASH, SWELLING OR PAIN  UNUSUAL VAGINAL DISCHARGE OR ITCHING   Items with * indicate a potential emergency and should be followed up as soon as possible or go to the Emergency Department if any problems should occur.  Please show the CHEMOTHERAPY ALERT CARD or IMMUNOTHERAPY ALERT CARD at  check-in to the Emergency Department and triage nurse.  Should you have questions after your visit or need to cancel or reschedule your appointment, please contact [facility]  Dept: [phone]  and follow the prompts.  Office hours are 8:00 a.m. to 4:30 p.m. Monday - Friday. Please note that voicemails left after 4:00 p.m. may not be returned until the following business day.  We are closed weekends and major holidays. You have access to a nurse at all times for urgent questions. Please call the main number to the clinic Dept: [phone] and follow the prompts.   For any non-urgent questions, you may also contact your provider using MyChart. We now offer e-Visits for anyone 18 and older to request care online for non-urgent symptoms. For details visit [email or web address].   Also download the MyChart app! Go to the app store, search "MyChart", open the app, select [facility], and log in with your MyChart username and password.  Due to Covid, a mask is required upon entering the hospital/clinic. If you do not have a mask, one will be given to you upon arrival. For doctor visits, patients may have 1 support person aged 18 or older with them. For treatment visits, patients cannot have anyone with them due to current Covid guidelines and our immunocompromised population.   

## 2021-08-28 NOTE — Progress Notes (Signed)
Nutrition Follow-up:  Patient with lung cancer. Followed by Dr Jacqualine Mau and [facility].  Patient receiving taxol and carboplatin with radiation.   Met with patient during infusion.  Patient reports that her appetite is about the same.  Patient drinking ensure complete during infusion and trying hummus.  Reports that she tries to drink 2 ensures per day.  Yesterday was able to eat some shrimp, salmon, broccoli, and mashed potatoes.    Reports that she is constipated and has not had a bowel movement in the last 5  days.  MD aware and taking miralax.      Medications: reviewed  Labs: reviewed  Anthropometrics:   Weight, unable to stand today  Last weight 176 lb on 8/30  UBW of 214 lb March 2022   NUTRITION DIAGNOSIS: Unintentional weight loss continues   INTERVENTION:  Reviewed importance of purchasing 350 calorie shake Reviewed importance of bowel regimen    MONITORING, EVALUATION, GOAL: weight trends, intake   NEXT VISIT: Monday, October 17 during infusion  Patricia Sampson, [place], [place] Registered Dietitian [phone] (mobile)

## 2021-08-28 NOTE — Progress Notes (Signed)
[facility] OFFICE PROGRESS NOTE  Greig Right, MD [address] [place] [address]  DIAGNOSIS: Stage IIIA (T4, N1, M0) non-small cell lung cancer, adenocarcinoma presented with large left upper lobe lung mass with chest wall invasion in addition to left suprahilar lymphadenopathy diagnosed in August 2022.   Biomarker Findings Tumor Mutational Burden - 24 Muts/Mb Microsatellite status - MS-Stable Genomic Findings For a complete list of the genes assayed, please refer to the Appendix. KRAS G12C ERBB2 amplification - equivocal? ARID1A M1443f*1 RICTOR amplification STK11 [id number] EPHA3 amplification - equivocal? FGF10amplification - equivocal? LYN amplification NFKBIA amplification - equivocal? NKX2-1 amplification - equivocal? SMAD2 Q116* SMARCA4 rearrangement intron 4 TP53 V157F 6 Disease relevant genes with no reportable alterations: ALK, BRAF, EGFR, MET, RET, ROS1   PDL1 Expression 0%  PRIOR THERAPY: None  CURRENT THERAPY: Concurrent chemoradiation with weekly carboplatin for AUC of 2 and paclitaxel 45 Mg/M2.  First dose August 08, 2021.  Status post 4 cycles.  INTERVAL HISTORY: Patricia Sampson a [age].o. female who returns to the clinic today for a follow-up visit accompanied by her son. Her main concern today is her fatigue. The patient is currently undergoing weekly concurrent chemoradiation.  She has been tolerating it well. Her last day of radiation scheduled on 09/25/2021. The patient has called in the interval complaining of right-sided chest pain near the location of her tumor. Since then, she reports her pain has steadily improved and is under control with tylenol. She is also prescribed Percocet for pain but reports she does not like to take as it makes her drowsy. She states that her chest pain is aggravated by coughing. She is having a productive cough with some yellow phlegm. Reports she does not think she is coughing  up blood but it can be difficult to tell as she frequently drinks strawberry ensure and her phlegm sometimes appears pink-tinged. She has baseline shortness of breath for which she is on oxygen. When active she is on 8L, where at rest she can comfortably go down to 5-6 L. She reports some pain and trouble swallowing that has also developed in the interim. She has been prescribed Carafate by Dr. MoLisbeth Renshawhich she says provides some relief.   Also in the interval, she required a blood transfusion on 9/29 due to fatigue and underlying anemia. She continues to report baseline fatigue and weakness as well as some cold intolerance. Dr. MoJulien Nordmannecommended that the patient have stool cards performed to assess for occult blood which she has not yet returned to the clinic. She denies any signs of acute bleeding including gingival bleeding, bruising, melena or blood in stools. She does report that she has not had a bowel movement in about a week. She takes Miralax occasionally but reports this causes her to have diarrhea.   She reports a decreased appetite and nausea after eating. Nutrition is following, last met with the patient on 10/3. They are planning on following up with her again on 09/23/2021. She is drinking ensures to supplement. She denies any vomiting. She denies any fever, chills, night sweats, or unexplained weight loss. She denies any headache or visual changes. Reports baseline peripheral neuropathy that has not worsened. She is here today for evaluation and repeat blood work before starting cycle #5.  MEDICAL HISTORY: Past Medical History:  Diagnosis Date   Angioedema    COPD (chronic obstructive pulmonary disease) (HCC)    FHx: migraine headaches    Hyperlipidemia  Hypertension    Seasonal allergies     ALLERGIES:  is allergic to dyazide [hydrochlorothiazide w-triamterene] and zestoretic [lisinopril-hydrochlorothiazide].  MEDICATIONS:  Current Outpatient Medications  Medication Sig  Dispense Refill   amitriptyline (ELAVIL) 10 MG tablet Take 10 mg by mouth daily.     amLODipine (NORVASC) 2.5 MG tablet Take 2.5 mg by mouth daily.     chlorthalidone (HYGROTON) 25 MG tablet Take 50 mg by mouth daily.      cholecalciferol (VITAMIN D3) 25 MCG (1000 UNIT) tablet Take 1,000 Units by mouth daily.     furosemide (LASIX) 20 MG tablet Take 20 mg by mouth daily.     gabapentin (NEURONTIN) 100 MG capsule Take 100 mg by mouth at bedtime.     Magnesium 250 MG TABS Take 250 mg by mouth daily.     potassium chloride (MICRO-K) 10 MEQ CR capsule Take 10 mEq by mouth daily.     rosuvastatin (CRESTOR) 10 MG tablet Take 10 mg by mouth daily.     sucralfate (CARAFATE) 1 g tablet Take 1 tablet (1 g total) by mouth 4 (four) times daily. Dissolve each tablet in 15 cc water before use. 120 tablet 2   albuterol (VENTOLIN HFA) 108 (90 Base) MCG/ACT inhaler Inhale 2 puffs into the lungs every 4 (four) hours as needed for wheezing or shortness of breath. (Patient not taking: No sig reported) 18 g 5   nystatin (MYCOSTATIN) 100000 UNIT/ML suspension Take 5 mLs (500,000 Units total) by mouth 4 (four) times daily. (Patient not taking: No sig reported) 60 mL 0   oxyCODONE-acetaminophen (PERCOCET/ROXICET) 5-325 MG tablet Take 1 tablet by mouth every 6 (six) hours as needed for severe pain. (Patient not taking: Reported on 09/04/2021) 30 tablet 0   prochlorperazine (COMPAZINE) 10 MG tablet Take 1 tablet (10 mg total) by mouth every 6 (six) hours as needed for nausea or vomiting. (Patient not taking: No sig reported) 30 tablet 0   SYMBICORT 160-4.5 MCG/ACT inhaler TAKE 2 PUFFS BY MOUTH TWICE A DAY (Patient not taking: No sig reported) 10.2 each 5   Tiotropium Bromide Monohydrate (SPIRIVA RESPIMAT) 2.5 MCG/ACT AERS Inhale 2 puffs into the lungs daily. (Patient not taking: No sig reported) 4 g 5   No current facility-administered medications for this visit.   Facility-Administered Medications Ordered in Other Visits   Medication Dose Route Frequency Provider Last Rate Last Admin   CARBOplatin (PARAPLATIN) 210 mg in sodium chloride 0.9 % 100 mL chemo infusion  210 mg Intravenous Once Curt Bears, MD       dexamethasone (DECADRON) 10 mg in sodium chloride 0.9 % 50 mL IVPB  10 mg Intravenous Once Curt Bears, MD       diphenhydrAMINE (BENADRYL) injection 50 mg  50 mg Intravenous Once Curt Bears, MD       famotidine (PEPCID) IVPB 20 mg in NS 100 mL IVPB  20 mg Intravenous Once Curt Bears, MD       heparin lock flush 100 unit/mL  500 Units Intracatheter Once PRN Curt Bears, MD       PACLitaxel (TAXOL) 90 mg in sodium chloride 0.9 % 250 mL chemo infusion (</= 39m/m2)  45 mg/m2 (Treatment Plan Recorded) Intravenous Once MCurt Bears MD       palonosetron (ALOXI) injection 0.25 mg  0.25 mg Intravenous Once MCurt Bears MD       sodium chloride flush (NS) 0.9 % injection 10 mL  10 mL Intracatheter PRN MCurt Bears MD  SURGICAL HISTORY: No past surgical history on file.  REVIEW OF SYSTEMS:   Review of Systems  Constitutional: Positive for decreased appetite and fatigue. Negative for chills, fever and unexpected weight change. Positive for cold intolerance. HENT:   Negative for mouth sores, nosebleeds, sore throat and trouble swallowing.   Eyes: Negative for eye problems and icterus.  Respiratory: Positive for productive cough and baseline shortness of breath. Negative for hemoptysis and wheezing.   Cardiovascular: Positive for chronic chest pain with coughing. Negative for leg swelling. Gastrointestinal: Negative for abdominal pain. Positive for constipation and occasional nausea. Negative for vomiting or diarrhea. Genitourinary: Negative for bladder incontinence, difficulty urinating, dysuria, frequency and hematuria.   Musculoskeletal: Negative for back pain, gait problem, neck pain and neck stiffness.  Skin: Negative for itching and rash.  Neurological: Negative  for dizziness, extremity weakness, gait problem, headaches, light-headedness and seizures. Positive for chronic peripheral neuropathy. Hematological: Negative for adenopathy. Does not bruise/bleed easily.  Psychiatric/Behavioral: Negative for confusion, depression and sleep disturbance. The patient is not nervous/anxious.     PHYSICAL EXAMINATION:  Blood pressure (!) 109/56, pulse (!) 116, temperature 97.7 F (36.5 C), temperature source Oral, resp. rate 18, height _0  (1.778 m), SpO2 95 %.  ECOG PERFORMANCE STATUS: 3  Physical Exam  Constitutional: Oriented to person, place, and time. Patient is chronically ill appearing, in no acute distress and is currently on 6L oxygen. HENT:  Head: Normocephalic and atraumatic.  Mouth/Throat: Oropharynx is clear and moist. No oropharyngeal exudate.  Eyes: Conjunctivae are normal. Right eye exhibits no discharge. Left eye exhibits no discharge. No scleral icterus.  Neck: Normal range of motion. Neck supple.  Cardiovascular: Tachycardic, regular rhythm, normal heart sounds and intact distal pulses.   Pulmonary/Chest: Effort normal and breath sounds normal. No respiratory distress. No wheezes. No rales.  Abdominal: Soft. Bowel sounds are normal. Exhibits no distension and no mass. There is no tenderness.  Musculoskeletal: Normal range of motion. Exhibits no edema.  Lymphadenopathy:    No cervical adenopathy.  Neurological: Alert and oriented to person, place, and time. Exhibits muscle wasting. Examined in the wheelchair.  Skin: Skin is warm and dry. No rash noted. Not diaphoretic. No erythema. No pallor.  Psychiatric: Mood, memory and judgment normal.  Vitals reviewed.  LABORATORY DATA: Lab Results  Component Value Date   WBC 2.8 (L) 09/04/2021   HGB 8.0 (L) 09/04/2021   HCT 28.1 (L) 09/04/2021   MCV 82.2 09/04/2021   PLT 127 (L) 09/04/2021      Chemistry      Component Value Date/Time   NA 138 09/04/2021 1014   K 3.5 09/04/2021 1014    CL 86 (L) 09/04/2021 1014   CO2 40 (H) 09/04/2021 1014   BUN 21 09/04/2021 1014   CREATININE 0.88 09/04/2021 1014      Component Value Date/Time   CALCIUM 9.5 09/04/2021 1014   ALKPHOS 81 09/04/2021 1014   AST 12 (L) 09/04/2021 1014   ALT 6 09/04/2021 1014   BILITOT 0.4 09/04/2021 1014       RADIOGRAPHIC STUDIES:  No results found.   ASSESSMENT/PLAN:  This is a very pleasant 65 year old African-American female diagnosed with stage IIIa (T4, N1, M0) non-small cell lung cancer, adenocarcinoma.  She presented with a large left upper lobe lung mass with chest wall invasion in addition to left suprahilar adenopathy.  She was diagnosed in August 2022.  Her molecular studies show that she is positive for a K-ras G12C mutation.  Her PD-L1  expression is negative.  The patient is currently undergoing concurrent chemoradiation with carboplatin for an AUC of 2 and paclitaxel 45 mg per metered square. She is status post 4 cycles. She is tolerating her treatment fairly well except for fatigue. She required a blood transfusion in the interval on 08/24/21.  She sees Dr. Lisbeth Renshaw for radiation, her last day of radiation scheduled for 09/25/2021.   Labs were reviewed. Recommend that she continue with cycle #5 of treatment today as scheduled. Will follow-up in two weeks.  Hemoglobin remains stable but low with hgb of 8 today, and she remains symptomatic. Will arrange for the patient to receive two units of blood for transfusion, likely when the patient comes in tomorrow. Will continue to monitor. Patient also encouraged to complete stool cards and return to clinic as soon as possible to assess for any GI blood losses.  For constipation, recommended that patient continue with Miralax but decrease the amount she is taking to prevent diarrhea. Also recommended she take a stool softener more regularly to prevent constipation in the future.   For productive cough, recommended OTC cough med such as delsym or  robitussin.    For the nausea with eating, advised to take compazine in the morning before meals to see if that helps. She will continue to use Carafate for the pain with swallowing. Nutrition will see her at her next appointment on 09/21/2021  The patient was advised to call immediately if she has any concerning symptoms in the interval. The patient voices understanding of current disease status and treatment options and is in agreement with the current care plan. All questions were answered. The patient knows to call the clinic with any problems, questions or concerns. We can certainly see the patient much sooner if necessary   Orders Placed This Encounter  Procedures   Care order/instruction    Transfuse Parameters    Standing Status:   Future    Standing Expiration Date:   09/04/2022   Type and screen         Standing Status:   Future    Number of Occurrences:   1    Standing Expiration Date:   09/04/2022   Prepare RBC (crossmatch)    Standing Status:   Standing    Number of Occurrences:   1    Order Specific Question:   # of Units    Answer:   2 units    Order Specific Question:   Transfusion Indications    Answer:   Symptomatic Anemia    Order Specific Question:   Number of Units to Keep Ahead    Answer:   NO units ahead    Order Specific Question:   If emergent release call blood bank    Answer:   Not emergent release     The total time spent in the appointment was 20-29 minutes.   Sreenidhi Ganson L Sirus Labrie, PA-C 09/04/21

## 2021-08-29 ENCOUNTER — Ambulatory Visit
Admission: RE | Admit: 2021-08-29 | Discharge: 2021-08-29 | Disposition: A | Discharge status: Home / Self Care | Payer: 59 | Source: Ambulatory Visit | Attending: Radiation Oncology | Admitting: Radiation Oncology

## 2021-08-29 DIAGNOSIS — C3412 Malignant neoplasm of upper lobe, left bronchus or lung: Secondary | ICD-10-CM | POA: Insufficient documentation

## 2021-08-29 DIAGNOSIS — Z510 Encounter for antineoplastic radiation therapy: Secondary | ICD-10-CM | POA: Insufficient documentation

## 2021-08-30 ENCOUNTER — Other Ambulatory Visit: Payer: Self-pay

## 2021-08-30 ENCOUNTER — Ambulatory Visit
Admission: RE | Admit: 2021-08-30 | Discharge: 2021-08-30 | Disposition: A | Discharge status: Home / Self Care | Payer: 59 | Source: Ambulatory Visit | Attending: Radiation Oncology | Admitting: Radiation Oncology

## 2021-08-30 DIAGNOSIS — Z510 Encounter for antineoplastic radiation therapy: Secondary | ICD-10-CM | POA: Diagnosis not present

## 2021-08-31 ENCOUNTER — Ambulatory Visit
Admission: RE | Admit: 2021-08-31 | Discharge: 2021-08-31 | Disposition: A | Discharge status: Home / Self Care | Payer: 59 | Source: Ambulatory Visit | Attending: Radiation Oncology | Admitting: Radiation Oncology

## 2021-08-31 DIAGNOSIS — Z510 Encounter for antineoplastic radiation therapy: Secondary | ICD-10-CM | POA: Diagnosis not present

## 2021-09-01 ENCOUNTER — Other Ambulatory Visit: Payer: Self-pay

## 2021-09-01 ENCOUNTER — Other Ambulatory Visit: Payer: Self-pay | Admitting: Radiation Oncology

## 2021-09-01 ENCOUNTER — Ambulatory Visit
Admission: RE | Admit: 2021-09-01 | Discharge: 2021-09-01 | Disposition: A | Discharge status: Home / Self Care | Payer: 59 | Source: Ambulatory Visit | Attending: Radiation Oncology | Admitting: Radiation Oncology

## 2021-09-01 DIAGNOSIS — Z510 Encounter for antineoplastic radiation therapy: Secondary | ICD-10-CM | POA: Diagnosis not present

## 2021-09-01 MED ORDER — SUCRALFATE 1 G PO TABS: 1.0000 g | ORAL_TABLET | Freq: Four times a day (QID) | ORAL | 2 refills | Status: AC

## 2021-09-01 MED FILL — Dexamethasone Sodium Phosphate Inj 100 MG/10ML: INTRAMUSCULAR | Qty: 1 | Status: AC

## 2021-09-02 ENCOUNTER — Encounter: Payer: Self-pay | Admitting: Internal Medicine

## 2021-09-04 ENCOUNTER — Inpatient Hospital Stay (HOSPITAL_BASED_OUTPATIENT_CLINIC_OR_DEPARTMENT_OTHER): Payer: 59 | Admitting: Physician Assistant

## 2021-09-04 ENCOUNTER — Inpatient Hospital Stay: Payer: 59

## 2021-09-04 ENCOUNTER — Ambulatory Visit
Admission: RE | Admit: 2021-09-04 | Discharge: 2021-09-04 | Disposition: A | Discharge status: Home / Self Care | Payer: 59 | Source: Ambulatory Visit | Attending: Radiation Oncology | Admitting: Radiation Oncology

## 2021-09-04 ENCOUNTER — Other Ambulatory Visit: Payer: Self-pay

## 2021-09-04 VITALS — HR 93 | Wt 164.5 lb

## 2021-09-04 VITALS — BP 109/56 | HR 116 | Temp 97.7°F | Resp 18 | Ht 70.0 in

## 2021-09-04 DIAGNOSIS — C3492 Malignant neoplasm of unspecified part of left bronchus or lung: Secondary | ICD-10-CM

## 2021-09-04 DIAGNOSIS — D649 Anemia, unspecified: Secondary | ICD-10-CM

## 2021-09-04 DIAGNOSIS — Z510 Encounter for antineoplastic radiation therapy: Secondary | ICD-10-CM | POA: Diagnosis not present

## 2021-09-04 DIAGNOSIS — Z5111 Encounter for antineoplastic chemotherapy: Secondary | ICD-10-CM

## 2021-09-04 LAB — CBC WITH DIFFERENTIAL (CANCER CENTER ONLY)
Abs Immature Granulocytes: 0.03 10*3/uL (ref 0.00–0.07)
Basophils Absolute: 0 10*3/uL (ref 0.0–0.1)
Basophils Relative: 0 %
Eosinophils Absolute: 0 10*3/uL (ref 0.0–0.5)
Eosinophils Relative: 0 %
HCT: 28.1 % — ABNORMAL LOW (ref 36.0–46.0)
Hemoglobin: 8 g/dL — ABNORMAL LOW (ref 12.0–15.0)
Immature Granulocytes: 1 %
Lymphocytes Relative: 9 %
Lymphs Abs: 0.3 10*3/uL — ABNORMAL LOW (ref 0.7–4.0)
MCH: 23.4 pg — ABNORMAL LOW (ref 26.0–34.0)
MCHC: 28.5 g/dL — ABNORMAL LOW (ref 30.0–36.0)
MCV: 82.2 fL (ref 80.0–100.0)
Monocytes Absolute: 0.1 10*3/uL (ref 0.1–1.0)
Monocytes Relative: 5 %
Neutro Abs: 2.4 10*3/uL (ref 1.7–7.7)
Neutrophils Relative %: 85 %
Platelet Count: 127 10*3/uL — ABNORMAL LOW (ref 150–400)
RBC: 3.42 MIL/uL — ABNORMAL LOW (ref 3.87–5.11)
RDW: 18.7 % — ABNORMAL HIGH (ref 11.5–15.5)
WBC Count: 2.8 10*3/uL — ABNORMAL LOW (ref 4.0–10.5)
nRBC: 0 % (ref 0.0–0.2)

## 2021-09-04 LAB — CMP (CANCER CENTER ONLY)
ALT: 6 U/L (ref 0–44)
AST: 12 U/L — ABNORMAL LOW (ref 15–41)
Albumin: 2.4 g/dL — ABNORMAL LOW (ref 3.5–5.0)
Alkaline Phosphatase: 81 U/L (ref 38–126)
Anion gap: 12 (ref 5–15)
BUN: 21 mg/dL (ref 8–23)
CO2: 40 mmol/L — ABNORMAL HIGH (ref 22–32)
Calcium: 9.5 mg/dL (ref 8.9–10.3)
Chloride: 86 mmol/L — ABNORMAL LOW (ref 98–111)
Creatinine: 0.88 mg/dL (ref 0.44–1.00)
GFR, Estimated: >60 mL/min (ref >60)
Glucose, Bld: 130 mg/dL — ABNORMAL HIGH (ref 70–99)
Potassium: 3.5 mmol/L (ref 3.5–5.1)
Sodium: 138 mmol/L (ref 135–145)
Total Bilirubin: 0.4 mg/dL (ref 0.3–1.2)
Total Protein: 7 g/dL (ref 6.5–8.1)

## 2021-09-04 LAB — SAMPLE TO BLOOD BANK

## 2021-09-04 LAB — PREPARE RBC (CROSSMATCH)

## 2021-09-04 MED ORDER — SODIUM CHLORIDE 0.9 % IV SOLN
Freq: Once | INTRAVENOUS | Status: AC
Start: 2021-09-04 — End: 2021-09-04

## 2021-09-04 MED ORDER — HEPARIN SOD (PORK) LOCK FLUSH 100 UNIT/ML IV SOLN: 500.0000 [IU] | Freq: Once | INTRAVENOUS | Status: DC | PRN

## 2021-09-04 MED ORDER — SODIUM CHLORIDE 0.9 % IV SOLN
10.0000 mg | Freq: Once | INTRAVENOUS | Status: AC
  Administered 2021-09-04: 10 mg via INTRAVENOUS
  Filled 2021-09-04: qty 10

## 2021-09-04 MED ORDER — SODIUM CHLORIDE 0.9% FLUSH: 10.0000 mL | INTRAVENOUS | Status: DC | PRN

## 2021-09-04 MED ORDER — DIPHENHYDRAMINE HCL 50 MG/ML IJ SOLN
50.0000 mg | Freq: Once | INTRAMUSCULAR | Status: AC
Start: 2021-09-04 — End: 2021-09-04
  Administered 2021-09-04: 50 mg via INTRAVENOUS
  Filled 2021-09-04: qty 1

## 2021-09-04 MED ORDER — PALONOSETRON HCL INJECTION 0.25 MG/5ML
0.2500 mg | Freq: Once | INTRAVENOUS | Status: AC
  Administered 2021-09-04: 0.25 mg via INTRAVENOUS
  Filled 2021-09-04: qty 5

## 2021-09-04 MED ORDER — SODIUM CHLORIDE 0.9 % IV SOLN
45.0000 mg/m2 | Freq: Once | INTRAVENOUS | Status: AC
  Administered 2021-09-04: 90 mg via INTRAVENOUS
  Filled 2021-09-04: qty 15

## 2021-09-04 MED ORDER — SODIUM CHLORIDE 0.9 % IV SOLN
213.2000 mg | Freq: Once | INTRAVENOUS | Status: AC
  Administered 2021-09-04: 210 mg via INTRAVENOUS
  Filled 2021-09-04: qty 21

## 2021-09-04 MED ORDER — FAMOTIDINE 20 MG IN NS 100 ML IVPB
20.0000 mg | Freq: Once | INTRAVENOUS | Status: AC
  Administered 2021-09-04: 20 mg via INTRAVENOUS
  Filled 2021-09-04: qty 100

## 2021-09-04 NOTE — Patient Instructions (Signed)
[facility]  Discharge Instructions: Thank you for choosing [facility] to provide your oncology and hematology care.   If you have a lab appointment with the [facility], please go directly to the [facility] and check in at the registration area.   Wear comfortable clothing and clothing appropriate for easy access to any Portacath or PICC line.   We strive to give you quality time with your provider. You may need to reschedule your appointment if you arrive late (15 or more minutes).  Arriving late affects you and other patients whose appointments are after yours.  Also, if you miss three or more appointments without notifying the office, you may be dismissed from the clinic at the provider's discretion.      For prescription refill requests, have your pharmacy contact our office and allow 72 hours for refills to be completed.    Today you received the following chemotherapy and/or immunotherapy agents Taxol and Carboplatin       To help prevent nausea and vomiting after your treatment, we encourage you to take your nausea medication as directed.  BELOW ARE SYMPTOMS THAT SHOULD BE REPORTED IMMEDIATELY: *FEVER GREATER THAN 100.4 F (38 C) OR HIGHER *CHILLS OR SWEATING *NAUSEA AND VOMITING THAT IS NOT CONTROLLED WITH YOUR NAUSEA MEDICATION *UNUSUAL SHORTNESS OF BREATH *UNUSUAL BRUISING OR BLEEDING *URINARY PROBLEMS (pain or burning when urinating, or frequent urination) *BOWEL PROBLEMS (unusual diarrhea, constipation, pain near the anus) TENDERNESS IN MOUTH AND THROAT WITH OR WITHOUT PRESENCE OF ULCERS (sore throat, sores in mouth, or a toothache) UNUSUAL RASH, SWELLING OR PAIN  UNUSUAL VAGINAL DISCHARGE OR ITCHING   Items with * indicate a potential emergency and should be followed up as soon as possible or go to the Emergency Department if any problems should occur.  Please show the CHEMOTHERAPY ALERT CARD or IMMUNOTHERAPY ALERT CARD at  check-in to the Emergency Department and triage nurse.  Should you have questions after your visit or need to cancel or reschedule your appointment, please contact [facility]  Dept: [phone]  and follow the prompts.  Office hours are 8:00 a.m. to 4:30 p.m. Monday - Friday. Please note that voicemails left after 4:00 p.m. may not be returned until the following business day.  We are closed weekends and major holidays. You have access to a nurse at all times for urgent questions. Please call the main number to the clinic Dept: [phone] and follow the prompts.   For any non-urgent questions, you may also contact your provider using MyChart. We now offer e-Visits for anyone 64 and older to request care online for non-urgent symptoms. For details visit [email or web address].   Also download the MyChart app! Go to the app store, search "MyChart", open the app, select [facility], and log in with your MyChart username and password.  Due to Covid, a mask is required upon entering the hospital/clinic. If you do not have a mask, one will be given to you upon arrival. For doctor visits, patients may have 1 support person aged 64 or older with them. For treatment visits, patients cannot have anyone with them due to current Covid guidelines and our immunocompromised population.

## 2021-09-05 ENCOUNTER — Other Ambulatory Visit: Payer: Self-pay | Admitting: *Deleted

## 2021-09-05 ENCOUNTER — Ambulatory Visit
Admission: RE | Admit: 2021-09-05 | Discharge: 2021-09-05 | Disposition: A | Discharge status: Home / Self Care | Payer: 59 | Source: Ambulatory Visit | Attending: Radiation Oncology | Admitting: Radiation Oncology

## 2021-09-05 ENCOUNTER — Inpatient Hospital Stay: Payer: 59

## 2021-09-05 DIAGNOSIS — C3492 Malignant neoplasm of unspecified part of left bronchus or lung: Secondary | ICD-10-CM

## 2021-09-05 DIAGNOSIS — D649 Anemia, unspecified: Secondary | ICD-10-CM

## 2021-09-05 DIAGNOSIS — Z510 Encounter for antineoplastic radiation therapy: Secondary | ICD-10-CM | POA: Diagnosis not present

## 2021-09-05 MED ORDER — DIPHENHYDRAMINE HCL 25 MG PO CAPS
25.0000 mg | ORAL_CAPSULE | Freq: Once | ORAL | Status: AC
  Administered 2021-09-05: 25 mg via ORAL
  Filled 2021-09-05: qty 1

## 2021-09-05 MED ORDER — SODIUM CHLORIDE 0.9% IV SOLUTION
250.0000 mL | Freq: Once | INTRAVENOUS | Status: AC
  Administered 2021-09-05: 250 mL via INTRAVENOUS

## 2021-09-05 MED ORDER — ACETAMINOPHEN 325 MG PO TABS
650.0000 mg | ORAL_TABLET | Freq: Once | ORAL | Status: AC
  Administered 2021-09-05: 650 mg via ORAL
  Filled 2021-09-05: qty 2

## 2021-09-05 NOTE — Patient Instructions (Signed)
Blood Transfusion, Adult, Care After This sheet gives you information about how to care for yourself after your procedure. Your doctor may also give you more specific instructions. If you have problems or questions, contact your doctor. What can I expect after the procedure? After the procedure, it is common to have: Bruising and soreness at the IV site. A headache. Follow these instructions at home: Insertion site care   Follow instructions from your doctor about how to take care of your insertion site. This is where an IV tube was put into your vein. Make sure you: Wash your hands with soap and water before and after you change your bandage (dressing). If you cannot use soap and water, use hand sanitizer. Change your bandage as told by your doctor. Check your insertion site every day for signs of infection. Check for: Redness, swelling, or pain. Bleeding from the site. Warmth. Pus or a bad smell. General instructions Take over-the-counter and prescription medicines only as told by your doctor. Rest as told by your doctor. Go back to your normal activities as told by your doctor. Keep all follow-up visits as told by your doctor. This is important. Contact a doctor if: You have itching or red, swollen areas of skin (hives). You feel worried or nervous (anxious). You feel weak after doing your normal activities. You have redness, swelling, warmth, or pain around the insertion site. You have blood coming from the insertion site, and the blood does not stop with pressure. You have pus or a bad smell coming from the insertion site. Get help right away if: You have signs of a serious reaction. This may be coming from an allergy or the body's defense system (immune system). Signs include: Trouble breathing or shortness of breath. Swelling of the face or feeling warm (flushed). Fever or chills. Head, chest, or back pain. Dark pee (urine) or blood in the pee. Widespread rash. Fast  heartbeat. Feeling dizzy or light-headed. You may receive your blood transfusion in an outpatient setting. If so, you will be told whom to contact to report any reactions. These symptoms may be an emergency. Do not wait to see if the symptoms will go away. Get medical help right away. Call your local emergency services (911 in the [place] Do not drive yourself to the hospital. Summary Bruising and soreness at the IV site are common. Check your insertion site every day for signs of infection. Rest as told by your doctor. Go back to your normal activities as told by your doctor. Get help right away if you have signs of a serious reaction. This information is not intended to replace advice given to you by your health care provider. Make sure you discuss any questions you have with your health care provider. Document Revised: 03/09/2021 Document Reviewed: 05/07/2019 Elsevier Patient Education  [place].

## 2021-09-06 ENCOUNTER — Ambulatory Visit
Admission: RE | Admit: 2021-09-06 | Discharge: 2021-09-06 | Disposition: A | Discharge status: Home / Self Care | Payer: 59 | Source: Ambulatory Visit | Attending: Radiation Oncology | Admitting: Radiation Oncology

## 2021-09-06 ENCOUNTER — Other Ambulatory Visit: Payer: Self-pay

## 2021-09-06 DIAGNOSIS — Z510 Encounter for antineoplastic radiation therapy: Secondary | ICD-10-CM | POA: Diagnosis not present

## 2021-09-06 LAB — TYPE AND SCREEN
ABO/RH(D): O POS
Antibody Screen: NEGATIVE
Unit division: 0
Unit division: 0

## 2021-09-06 LAB — BPAM RBC
Blood Product Expiration Date: 202210132359
Blood Product Expiration Date: 202211102359
ISSUE DATE / TIME: 202210111257
ISSUE DATE / TIME: 202210111257
Unit Type and Rh: 5100
Unit Type and Rh: 5100

## 2021-09-07 ENCOUNTER — Ambulatory Visit
Admission: RE | Admit: 2021-09-07 | Discharge: 2021-09-07 | Disposition: A | Discharge status: Home / Self Care | Payer: 59 | Source: Ambulatory Visit | Attending: Radiation Oncology | Admitting: Radiation Oncology

## 2021-09-07 DIAGNOSIS — Z510 Encounter for antineoplastic radiation therapy: Secondary | ICD-10-CM | POA: Diagnosis not present

## 2021-09-08 ENCOUNTER — Ambulatory Visit
Admission: RE | Admit: 2021-09-08 | Discharge: 2021-09-08 | Disposition: A | Discharge status: Home / Self Care | Payer: 59 | Source: Ambulatory Visit | Attending: Radiation Oncology | Admitting: Radiation Oncology

## 2021-09-08 ENCOUNTER — Other Ambulatory Visit: Payer: Self-pay

## 2021-09-08 DIAGNOSIS — J9621 Acute and chronic respiratory failure with hypoxia: Secondary | ICD-10-CM | POA: Diagnosis not present

## 2021-09-08 DIAGNOSIS — R4182 Altered mental status, unspecified: Secondary | ICD-10-CM | POA: Diagnosis not present

## 2021-09-08 MED FILL — Dexamethasone Sodium Phosphate Inj 100 MG/10ML: INTRAMUSCULAR | Qty: 1 | Status: AC

## 2021-09-11 ENCOUNTER — Inpatient Hospital Stay: Payer: 59

## 2021-09-11 ENCOUNTER — Inpatient Hospital Stay (HOSPITAL_COMMUNITY): Payer: 59

## 2021-09-11 ENCOUNTER — Emergency Department (HOSPITAL_COMMUNITY): Payer: 59

## 2021-09-11 ENCOUNTER — Encounter (HOSPITAL_COMMUNITY): Payer: Self-pay | Admitting: *Deleted

## 2021-09-11 ENCOUNTER — Ambulatory Visit: Payer: 59

## 2021-09-11 ENCOUNTER — Inpatient Hospital Stay (HOSPITAL_COMMUNITY)
Admission: EM | Admit: 2021-09-11 | Discharge: 2021-10-26 | DRG: 189 | Disposition: E | Payer: 59 | Attending: Family Medicine | Admitting: Family Medicine

## 2021-09-11 DIAGNOSIS — R0689 Other abnormalities of breathing: Secondary | ICD-10-CM

## 2021-09-11 DIAGNOSIS — J9611 Chronic respiratory failure with hypoxia: Secondary | ICD-10-CM | POA: Diagnosis present

## 2021-09-11 DIAGNOSIS — R918 Other nonspecific abnormal finding of lung field: Secondary | ICD-10-CM

## 2021-09-11 DIAGNOSIS — R0902 Hypoxemia: Secondary | ICD-10-CM

## 2021-09-11 DIAGNOSIS — E871 Hypo-osmolality and hyponatremia: Secondary | ICD-10-CM | POA: Diagnosis not present

## 2021-09-11 DIAGNOSIS — Z87891 Personal history of nicotine dependence: Secondary | ICD-10-CM

## 2021-09-11 DIAGNOSIS — R0600 Dyspnea, unspecified: Secondary | ICD-10-CM | POA: Diagnosis not present

## 2021-09-11 DIAGNOSIS — J9621 Acute and chronic respiratory failure with hypoxia: Principal | ICD-10-CM

## 2021-09-11 DIAGNOSIS — Z5320 Procedure and treatment not carried out because of patient's decision for unspecified reasons: Secondary | ICD-10-CM | POA: Diagnosis present

## 2021-09-11 DIAGNOSIS — J449 Chronic obstructive pulmonary disease, unspecified: Secondary | ICD-10-CM | POA: Diagnosis present

## 2021-09-11 DIAGNOSIS — D611 Drug-induced aplastic anemia: Secondary | ICD-10-CM | POA: Diagnosis present

## 2021-09-11 DIAGNOSIS — I480 Paroxysmal atrial fibrillation: Secondary | ICD-10-CM | POA: Diagnosis not present

## 2021-09-11 DIAGNOSIS — I4892 Unspecified atrial flutter: Secondary | ICD-10-CM | POA: Diagnosis not present

## 2021-09-11 DIAGNOSIS — E8729 Other acidosis: Secondary | ICD-10-CM | POA: Diagnosis present

## 2021-09-11 DIAGNOSIS — G929 Unspecified toxic encephalopathy: Secondary | ICD-10-CM | POA: Diagnosis present

## 2021-09-11 DIAGNOSIS — Z6823 Body mass index (BMI) 23.0-23.9, adult: Secondary | ICD-10-CM

## 2021-09-11 DIAGNOSIS — R5381 Other malaise: Secondary | ICD-10-CM | POA: Diagnosis not present

## 2021-09-11 DIAGNOSIS — G039 Meningitis, unspecified: Secondary | ICD-10-CM | POA: Diagnosis not present

## 2021-09-11 DIAGNOSIS — R4182 Altered mental status, unspecified: Secondary | ICD-10-CM

## 2021-09-11 DIAGNOSIS — E876 Hypokalemia: Secondary | ICD-10-CM | POA: Diagnosis not present

## 2021-09-11 DIAGNOSIS — Z833 Family history of diabetes mellitus: Secondary | ICD-10-CM

## 2021-09-11 DIAGNOSIS — D61810 Antineoplastic chemotherapy induced pancytopenia: Secondary | ICD-10-CM | POA: Diagnosis present

## 2021-09-11 DIAGNOSIS — I959 Hypotension, unspecified: Secondary | ICD-10-CM | POA: Diagnosis not present

## 2021-09-11 DIAGNOSIS — R4701 Aphasia: Secondary | ICD-10-CM | POA: Diagnosis present

## 2021-09-11 DIAGNOSIS — C3432 Malignant neoplasm of lower lobe, left bronchus or lung: Secondary | ICD-10-CM | POA: Diagnosis present

## 2021-09-11 DIAGNOSIS — I110 Hypertensive heart disease with heart failure: Secondary | ICD-10-CM | POA: Diagnosis present

## 2021-09-11 DIAGNOSIS — G9341 Metabolic encephalopathy: Secondary | ICD-10-CM | POA: Diagnosis not present

## 2021-09-11 DIAGNOSIS — D649 Anemia, unspecified: Secondary | ICD-10-CM

## 2021-09-11 DIAGNOSIS — C7951 Secondary malignant neoplasm of bone: Secondary | ICD-10-CM | POA: Diagnosis present

## 2021-09-11 DIAGNOSIS — Z9981 Dependence on supplemental oxygen: Secondary | ICD-10-CM

## 2021-09-11 DIAGNOSIS — Z20822 Contact with and (suspected) exposure to covid-19: Secondary | ICD-10-CM | POA: Diagnosis present

## 2021-09-11 DIAGNOSIS — R54 Age-related physical debility: Secondary | ICD-10-CM | POA: Diagnosis present

## 2021-09-11 DIAGNOSIS — R6510 Systemic inflammatory response syndrome (SIRS) of non-infectious origin without acute organ dysfunction: Secondary | ICD-10-CM | POA: Diagnosis not present

## 2021-09-11 DIAGNOSIS — Z66 Do not resuscitate: Secondary | ICD-10-CM | POA: Diagnosis not present

## 2021-09-11 DIAGNOSIS — R6520 Severe sepsis without septic shock: Secondary | ICD-10-CM

## 2021-09-11 DIAGNOSIS — A419 Sepsis, unspecified organism: Secondary | ICD-10-CM

## 2021-09-11 DIAGNOSIS — R9431 Abnormal electrocardiogram [ECG] [EKG]: Secondary | ICD-10-CM | POA: Diagnosis not present

## 2021-09-11 DIAGNOSIS — R4587 Impulsiveness: Secondary | ICD-10-CM | POA: Diagnosis not present

## 2021-09-11 DIAGNOSIS — C7949 Secondary malignant neoplasm of other parts of nervous system: Secondary | ICD-10-CM | POA: Diagnosis present

## 2021-09-11 DIAGNOSIS — Z7189 Other specified counseling: Secondary | ICD-10-CM

## 2021-09-11 DIAGNOSIS — C779 Secondary and unspecified malignant neoplasm of lymph node, unspecified: Secondary | ICD-10-CM | POA: Diagnosis present

## 2021-09-11 DIAGNOSIS — N179 Acute kidney failure, unspecified: Secondary | ICD-10-CM

## 2021-09-11 DIAGNOSIS — F40240 Claustrophobia: Secondary | ICD-10-CM | POA: Diagnosis present

## 2021-09-11 DIAGNOSIS — J441 Chronic obstructive pulmonary disease with (acute) exacerbation: Secondary | ICD-10-CM | POA: Diagnosis not present

## 2021-09-11 DIAGNOSIS — R410 Disorientation, unspecified: Secondary | ICD-10-CM | POA: Diagnosis not present

## 2021-09-11 DIAGNOSIS — I272 Pulmonary hypertension, unspecified: Secondary | ICD-10-CM | POA: Diagnosis present

## 2021-09-11 DIAGNOSIS — J9622 Acute and chronic respiratory failure with hypercapnia: Secondary | ICD-10-CM | POA: Diagnosis not present

## 2021-09-11 DIAGNOSIS — Z515 Encounter for palliative care: Secondary | ICD-10-CM

## 2021-09-11 DIAGNOSIS — R1310 Dysphagia, unspecified: Secondary | ICD-10-CM | POA: Diagnosis not present

## 2021-09-11 DIAGNOSIS — G0490 Encephalitis and encephalomyelitis, unspecified: Secondary | ICD-10-CM | POA: Diagnosis present

## 2021-09-11 DIAGNOSIS — G9340 Encephalopathy, unspecified: Secondary | ICD-10-CM | POA: Diagnosis not present

## 2021-09-11 DIAGNOSIS — I493 Ventricular premature depolarization: Secondary | ICD-10-CM | POA: Diagnosis not present

## 2021-09-11 DIAGNOSIS — Z8616 Personal history of COVID-19: Secondary | ICD-10-CM | POA: Diagnosis not present

## 2021-09-11 DIAGNOSIS — E43 Unspecified severe protein-calorie malnutrition: Secondary | ICD-10-CM | POA: Diagnosis present

## 2021-09-11 DIAGNOSIS — Z825 Family history of asthma and other chronic lower respiratory diseases: Secondary | ICD-10-CM

## 2021-09-11 DIAGNOSIS — I4891 Unspecified atrial fibrillation: Secondary | ICD-10-CM | POA: Diagnosis not present

## 2021-09-11 DIAGNOSIS — G893 Neoplasm related pain (acute) (chronic): Secondary | ICD-10-CM | POA: Diagnosis present

## 2021-09-11 DIAGNOSIS — Z8249 Family history of ischemic heart disease and other diseases of the circulatory system: Secondary | ICD-10-CM

## 2021-09-11 DIAGNOSIS — R64 Cachexia: Secondary | ICD-10-CM | POA: Diagnosis present

## 2021-09-11 DIAGNOSIS — R627 Adult failure to thrive: Secondary | ICD-10-CM | POA: Diagnosis present

## 2021-09-11 DIAGNOSIS — T451X5A Adverse effect of antineoplastic and immunosuppressive drugs, initial encounter: Secondary | ICD-10-CM | POA: Diagnosis present

## 2021-09-11 DIAGNOSIS — Z7951 Long term (current) use of inhaled steroids: Secondary | ICD-10-CM

## 2021-09-11 DIAGNOSIS — Z923 Personal history of irradiation: Secondary | ICD-10-CM

## 2021-09-11 DIAGNOSIS — C3412 Malignant neoplasm of upper lobe, left bronchus or lung: Secondary | ICD-10-CM | POA: Diagnosis present

## 2021-09-11 DIAGNOSIS — R451 Restlessness and agitation: Secondary | ICD-10-CM | POA: Diagnosis not present

## 2021-09-11 DIAGNOSIS — C3492 Malignant neoplasm of unspecified part of left bronchus or lung: Secondary | ICD-10-CM | POA: Diagnosis not present

## 2021-09-11 DIAGNOSIS — E785 Hyperlipidemia, unspecified: Secondary | ICD-10-CM | POA: Diagnosis present

## 2021-09-11 DIAGNOSIS — G43909 Migraine, unspecified, not intractable, without status migrainosus: Secondary | ICD-10-CM | POA: Diagnosis present

## 2021-09-11 DIAGNOSIS — Z888 Allergy status to other drugs, medicaments and biological substances status: Secondary | ICD-10-CM

## 2021-09-11 DIAGNOSIS — J9690 Respiratory failure, unspecified, unspecified whether with hypoxia or hypercapnia: Secondary | ICD-10-CM

## 2021-09-11 DIAGNOSIS — Z79899 Other long term (current) drug therapy: Secondary | ICD-10-CM

## 2021-09-11 LAB — COMPREHENSIVE METABOLIC PANEL
ALT: 10 U/L (ref 0–44)
AST: 17 U/L (ref 15–41)
Albumin: 2.7 g/dL — ABNORMAL LOW (ref 3.5–5.0)
Alkaline Phosphatase: 73 U/L (ref 38–126)
Anion gap: 11 (ref 5–15)
BUN: 22 mg/dL (ref 8–23)
CO2: 38 mmol/L — ABNORMAL HIGH (ref 22–32)
Calcium: 8.6 mg/dL — ABNORMAL LOW (ref 8.9–10.3)
Chloride: 86 mmol/L — ABNORMAL LOW (ref 98–111)
Creatinine, Ser: 0.95 mg/dL (ref 0.44–1.00)
GFR, Estimated: >60 mL/min (ref >60)
Glucose, Bld: 107 mg/dL — ABNORMAL HIGH (ref 70–99)
Potassium: 3.5 mmol/L (ref 3.5–5.1)
Sodium: 135 mmol/L (ref 135–145)
Total Bilirubin: 0.6 mg/dL (ref 0.3–1.2)
Total Protein: 7.1 g/dL (ref 6.5–8.1)

## 2021-09-11 LAB — URINALYSIS, ROUTINE W REFLEX MICROSCOPIC
Bacteria, UA: NONE SEEN
Bilirubin Urine: NEGATIVE
Glucose, UA: NEGATIVE mg/dL
Hgb urine dipstick: NEGATIVE
Ketones, ur: 20 mg/dL — AB
Leukocytes,Ua: NEGATIVE
Nitrite: NEGATIVE
Protein, ur: 30 mg/dL — AB
Specific Gravity, Urine: 1.026 (ref 1.005–1.030)
pH: 7 (ref 5.0–8.0)

## 2021-09-11 LAB — CBC WITH DIFFERENTIAL/PLATELET
Abs Immature Granulocytes: 0.01 10*3/uL (ref 0.00–0.07)
Basophils Absolute: 0 10*3/uL (ref 0.0–0.1)
Basophils Relative: 1 %
Eosinophils Absolute: 0 10*3/uL (ref 0.0–0.5)
Eosinophils Relative: 0 %
HCT: 33.6 % — ABNORMAL LOW (ref 36.0–46.0)
Hemoglobin: 10 g/dL — ABNORMAL LOW (ref 12.0–15.0)
Immature Granulocytes: 1 %
Lymphocytes Relative: 14 %
Lymphs Abs: 0.3 10*3/uL — ABNORMAL LOW (ref 0.7–4.0)
MCH: 25.1 pg — ABNORMAL LOW (ref 26.0–34.0)
MCHC: 29.8 g/dL — ABNORMAL LOW (ref 30.0–36.0)
MCV: 84.2 fL (ref 80.0–100.0)
Monocytes Absolute: 0.2 10*3/uL (ref 0.1–1.0)
Monocytes Relative: 11 %
Neutro Abs: 1.5 10*3/uL — ABNORMAL LOW (ref 1.7–7.7)
Neutrophils Relative %: 73 %
Platelets: 139 10*3/uL — ABNORMAL LOW (ref 150–400)
RBC: 3.99 MIL/uL (ref 3.87–5.11)
RDW: 18.3 % — ABNORMAL HIGH (ref 11.5–15.5)
WBC: 2 10*3/uL — ABNORMAL LOW (ref 4.0–10.5)
nRBC: 0 % (ref 0.0–0.2)

## 2021-09-11 LAB — BLOOD GAS, ARTERIAL
Acid-Base Excess: 11.2 mmol/L — ABNORMAL HIGH (ref 0.0–2.0)
Bicarbonate: 36.6 mmol/L — ABNORMAL HIGH (ref 20.0–28.0)
Drawn by: 22052
FIO2: 30
O2 Content: 2.5 L/min
O2 Saturation: 66.2 %
Patient temperature: 98.6
pCO2 arterial: 54.2 mmHg — ABNORMAL HIGH (ref 32.0–48.0)
pH, Arterial: 7.444 (ref 7.350–7.450)
pO2, Arterial: 35.2 mmHg — CL (ref 83.0–108.0)

## 2021-09-11 LAB — RAPID URINE DRUG SCREEN, HOSP PERFORMED
Amphetamines: NOT DETECTED
Barbiturates: NOT DETECTED
Benzodiazepines: NOT DETECTED
Cocaine: NOT DETECTED
Opiates: POSITIVE — AB
Tetrahydrocannabinol: NOT DETECTED

## 2021-09-11 LAB — I-STAT CHEM 8, ED
BUN: 20 mg/dL (ref 8–23)
Calcium, Ion: 1.09 mmol/L — ABNORMAL LOW (ref 1.15–1.40)
Chloride: 86 mmol/L — ABNORMAL LOW (ref 98–111)
Creatinine, Ser: 0.8 mg/dL (ref 0.44–1.00)
Glucose, Bld: 106 mg/dL — ABNORMAL HIGH (ref 70–99)
HCT: 32 % — ABNORMAL LOW (ref 36.0–46.0)
Hemoglobin: 10.9 g/dL — ABNORMAL LOW (ref 12.0–15.0)
Potassium: 3.5 mmol/L (ref 3.5–5.1)
Sodium: 135 mmol/L (ref 135–145)
TCO2: 37 mmol/L — ABNORMAL HIGH (ref 22–32)

## 2021-09-11 LAB — PROTIME-INR
INR: 1.1 (ref 0.8–1.2)
Prothrombin Time: 14.2 seconds (ref 11.4–15.2)

## 2021-09-11 LAB — APTT: aPTT: 32 seconds (ref 24–36)

## 2021-09-11 LAB — RESP PANEL BY RT-PCR (FLU A&B, COVID) ARPGX2
Influenza A by PCR: NEGATIVE
Influenza B by PCR: NEGATIVE
SARS Coronavirus 2 by RT PCR: NEGATIVE

## 2021-09-11 LAB — AMMONIA
Ammonia: 18 umol/L (ref 9–35)
Ammonia: 31 umol/L (ref 9–35)

## 2021-09-11 LAB — LACTIC ACID, PLASMA: Lactic Acid, Venous: 1.7 mmol/L (ref 0.5–1.9)

## 2021-09-11 LAB — MRSA NEXT GEN BY PCR, NASAL: MRSA by PCR Next Gen: NOT DETECTED

## 2021-09-11 IMAGING — MR MR HEAD W/O CM
10 series · 40 of 48 positions shown · non-contrast
Comparison: Head CT earlier same day.  MRI [DATE].

CLINICAL DATA: Transient ischemic attack.  Altered mental status.

EXAM:
MRI HEAD WITHOUT CONTRAST
TECHNIQUE: Multiplanar, multiecho pulse sequences of the brain and surrounding
structures were obtained without intravenous contrast.

[Series 5: dwi_tracew · axial · 3.0mm · 1.08mm/px · z∈[-41,+104]mm · 8 of 102 slices shown]
[im 1/102]
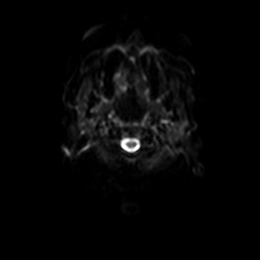
[im 19/102]
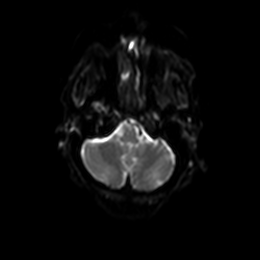
[im 28/102]
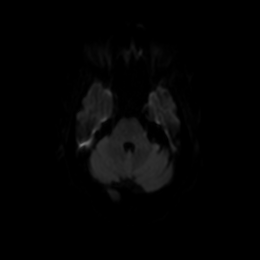
[im 46/102]
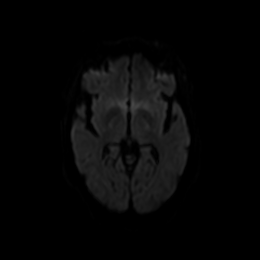
[im 56/102]
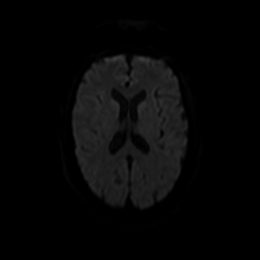
[im 74/102]
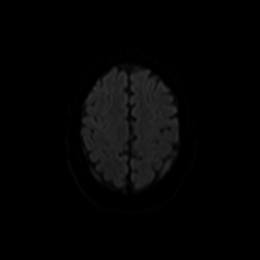
[im 83/102]
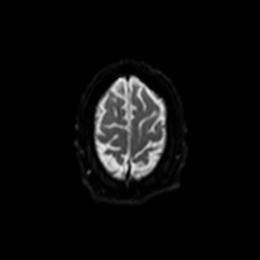
[im 102/102]
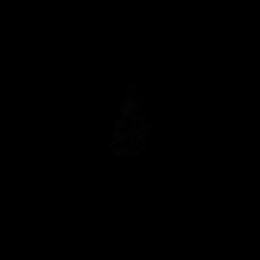

[Series 6: dwi_adc · axial · 3.0mm · 1.08mm/px · 1 of 51 slices shown]
[im 1/51]
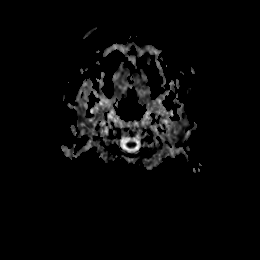

[Series 7: FLAIR · axial · 3.0mm · 0.86mm/px · z∈[-46,+99]mm · 5 of 51 slices shown]
[im 1/51]
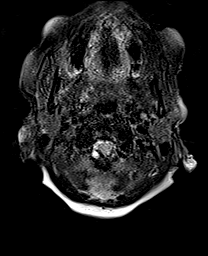
[im 13/51]
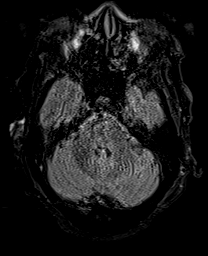
[im 26/51]
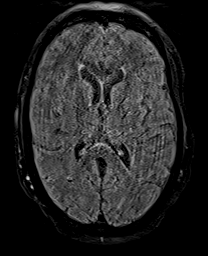
[im 38/51]
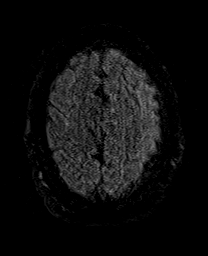
[im 51/51]
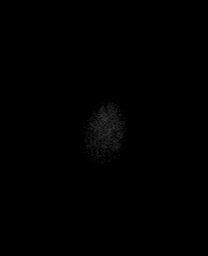

[Series 8: GRE · axial · 3.0mm · 0.45mm/px · z∈[-45,+100]mm · 5 of 51 slices shown]
[im 1/51]
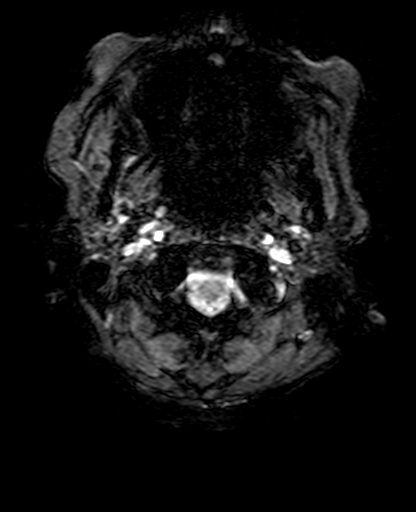
[im 13/51]
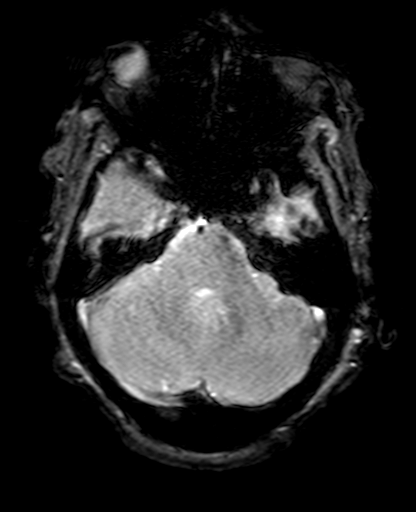
[im 26/51]
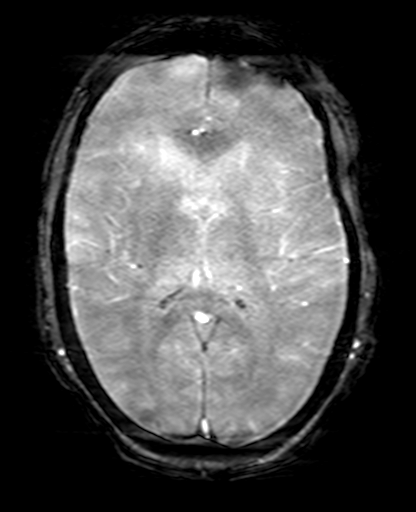
[im 38/51]
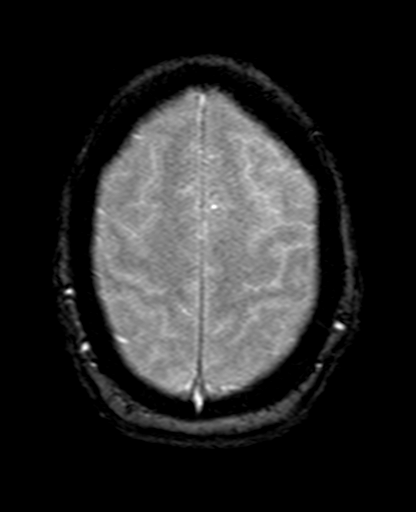
[im 51/51]
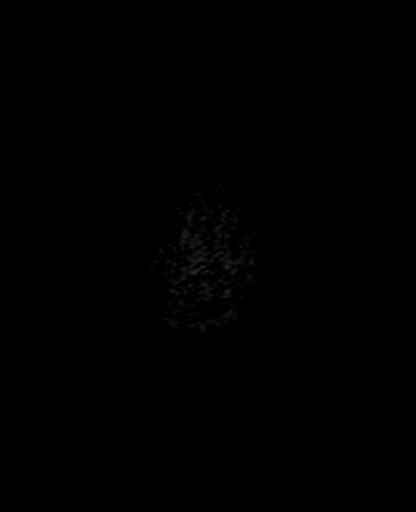

[Series 9: T2 · axial · 5.0mm · 0.45mm/px · z∈[-46,+99]mm · 3 of 24 slices shown (1 of 3)]
[im 1/24]
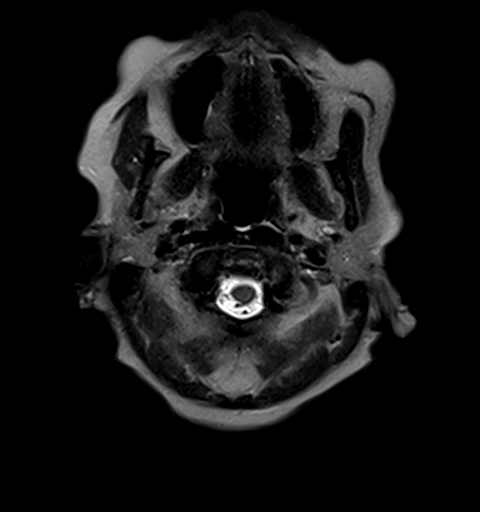
[im 12/24]
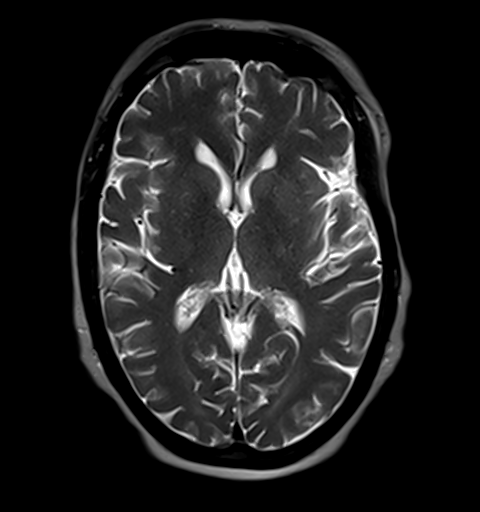
[im 24/24]
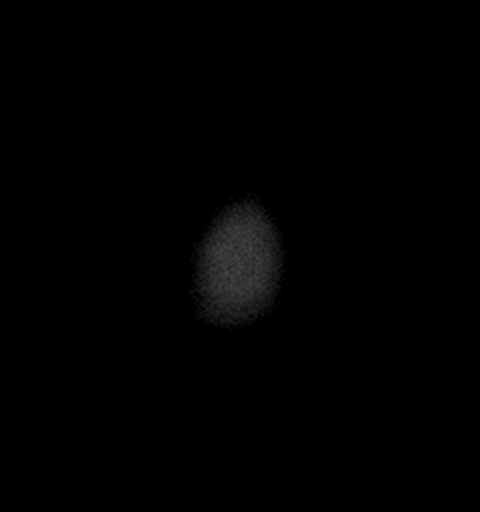

[Series 10: T2 · sagittal · 5.0mm · 0.47mm/px · 3 of 24 slices shown (2 of 3)]
[im 1/24]
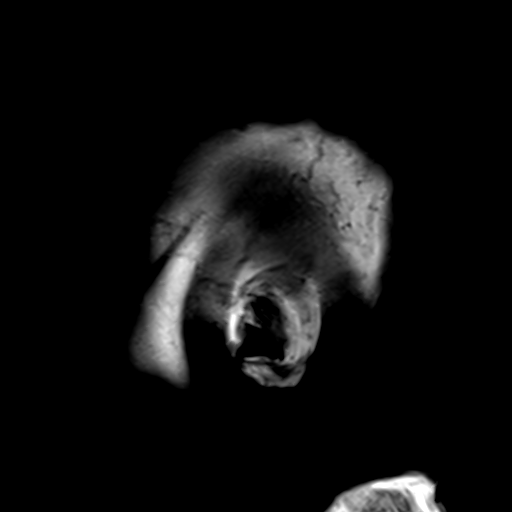
[im 12/24]
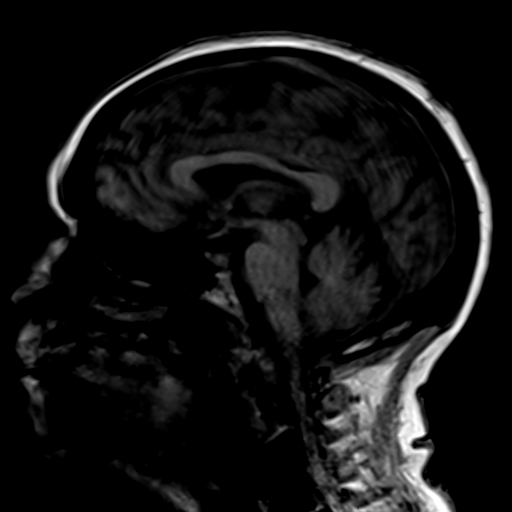
[im 24/24]
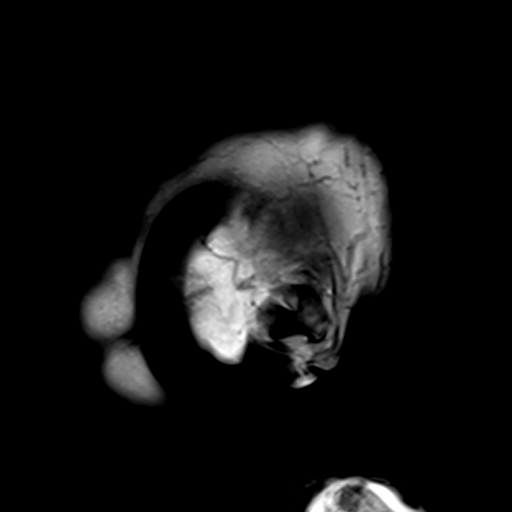

[Series 11: T1 · axial · 3.0mm · 0.45mm/px · z∈[-45,+100]mm · 5 of 51 slices shown]
[im 1/51]
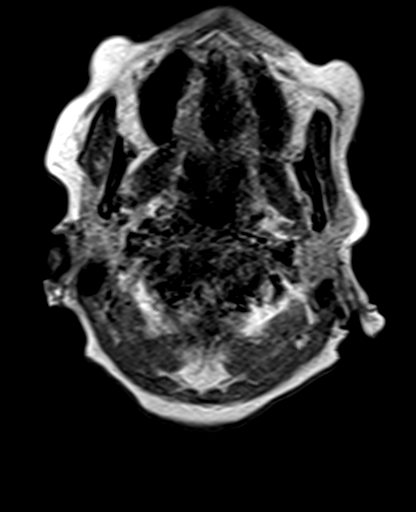
[im 13/51]
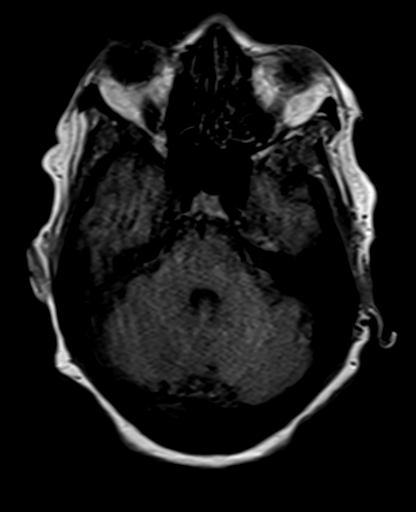
[im 26/51]
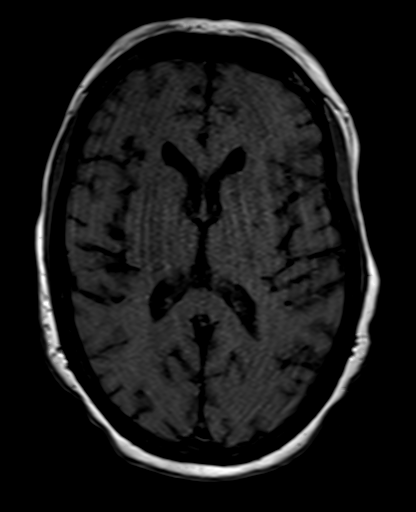
[im 38/51]
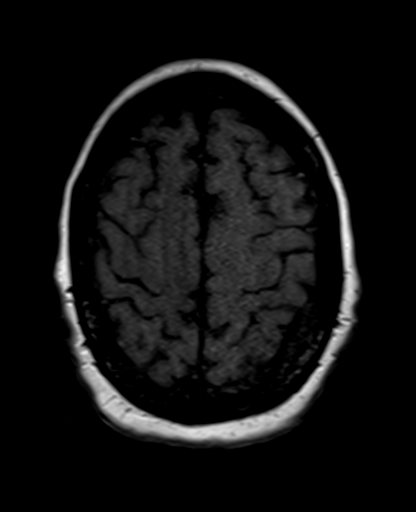
[im 51/51]
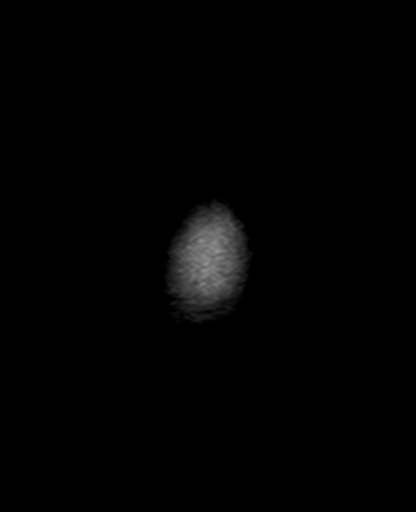

[Series 12: DWI · coronal · 5.0mm · 1.31mm/px · 6 of 56 slices shown (1 of 2)]
[im 1/56]
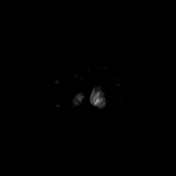
[im 12/56]
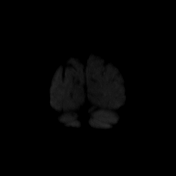
[im 23/56]
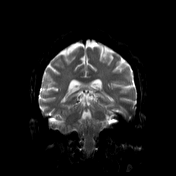
[im 34/56]
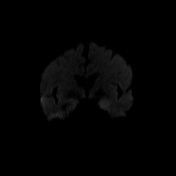
[im 45/56]
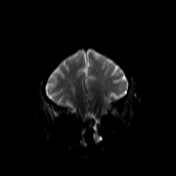
[im 56/56]
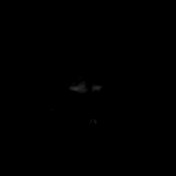

[Series 13: DWI · coronal · 5.0mm · 1.31mm/px · 3 of 28 slices shown (2 of 2)]
[im 1/28]
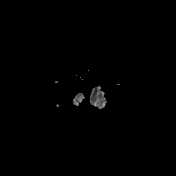
[im 14/28]
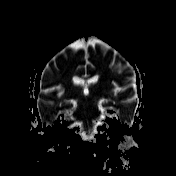
[im 28/28]
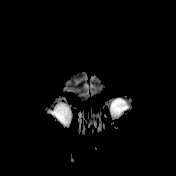

[Series 14: T2 · coronal · 5.0mm · 0.86mm/px · 1 of 14 slices shown (3 of 3)]
[im 1/14]
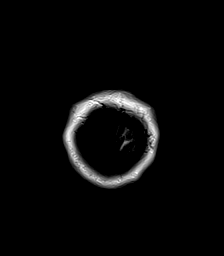

[40 of 48 positions shown; findings below may reference images not displayed]

FINDINGS: Brain: The study suffers from considerable motion degradation due to
poor cooperation. Diffusion imaging does not show any acute or
subacute infarction. There are minimal small vessel ischemic changes
of the cerebral hemispheric white matter. No sign of large vessel
infarction. No mass lesion, hemorrhage, hydrocephalus or extra-axial
collection.

Vascular: Major vessels at the base of the brain show flow.

Skull and upper cervical spine: Negative

Sinuses/Orbits: Clear/normal

Other: None
IMPRESSION: Significantly motion degraded exam. No sign of acute or subacute
insult. Mild chronic small-vessel change of the cerebral hemispheric
white matter.

## 2021-09-11 IMAGING — CT CT ABD-PELV W/ CM
3 of 5 series · 16 of 46 positions shown, 18 images · IV contrast (omnipaque)
Comparison: PET [DATE] and CT abdomen pelvis [DATE].

CLINICAL DATA: Altered mental status, acute abdominal pain.

EXAM:
CT ABDOMEN AND PELVIS WITH CONTRAST
TECHNIQUE: Multidetector CT imaging of the abdomen and pelvis was performed
using the standard protocol following bolus administration of
intravenous contrast.
CONTRAST:  80mL OMNIPAQUE IOHEXOL 350 MG/ML SOLN

[Series 2: axial st · axial · 0.79mm/px · z∈[-876,-506]mm · 12 of 88 slices shown, 14 images]
[im 7/88  soft-tissue]
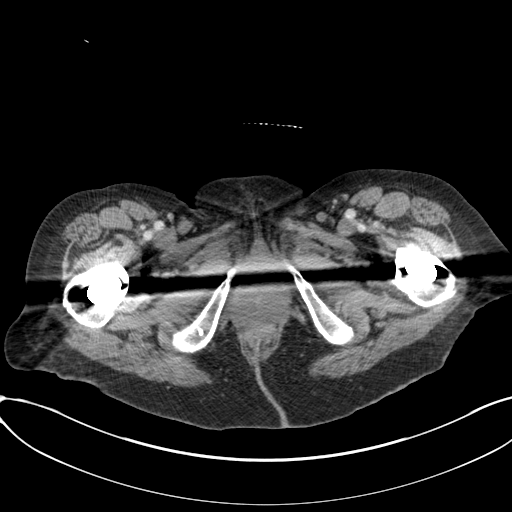
[im 7/88  bone]
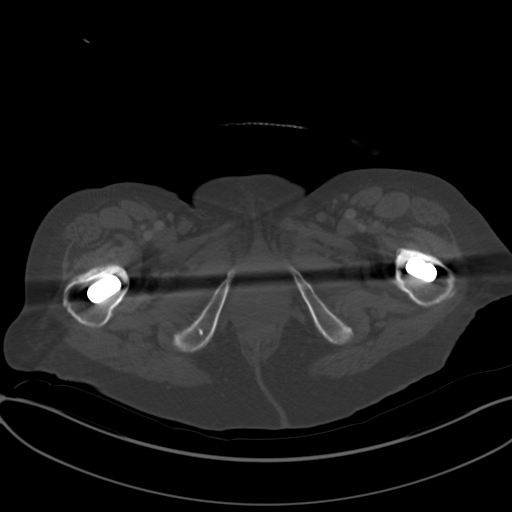
[im 14/88  soft-tissue]
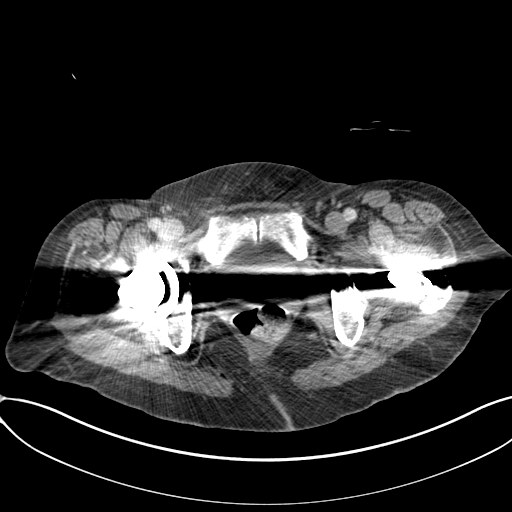
[im 21/88  soft-tissue]
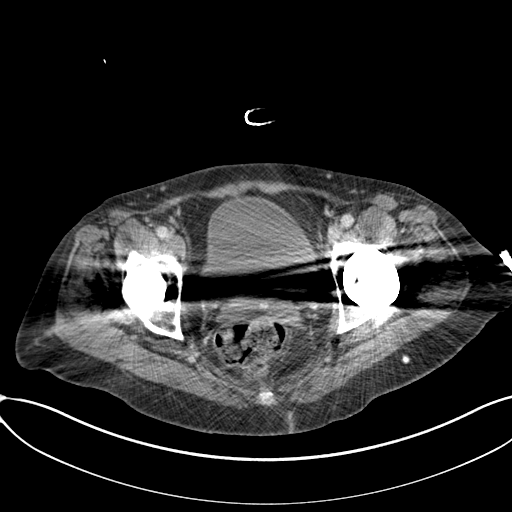
[im 27/88  soft-tissue]
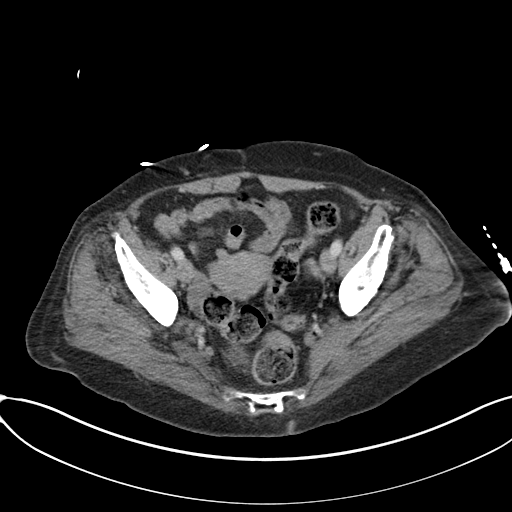
[im 34/88  soft-tissue]
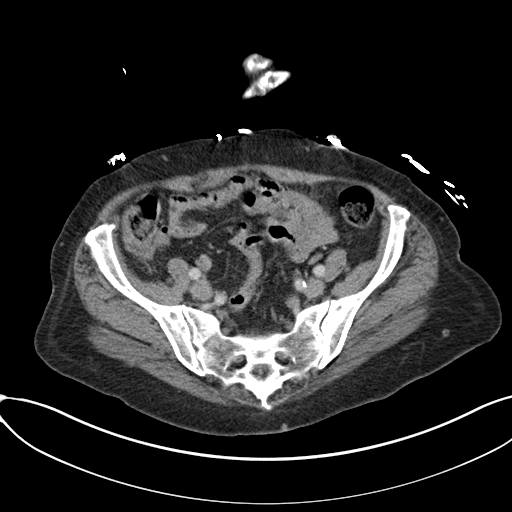
[im 41/88  soft-tissue]
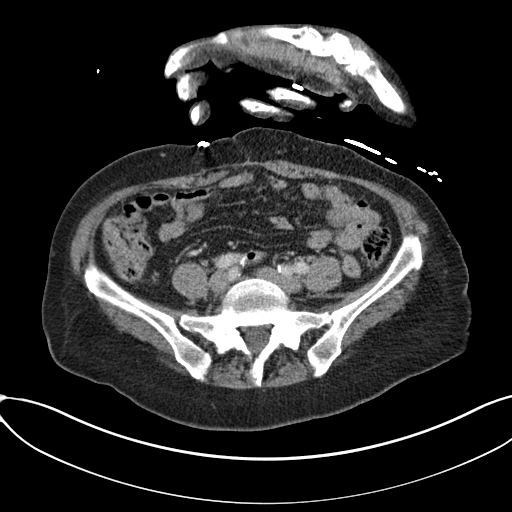
[im 47/88  soft-tissue]
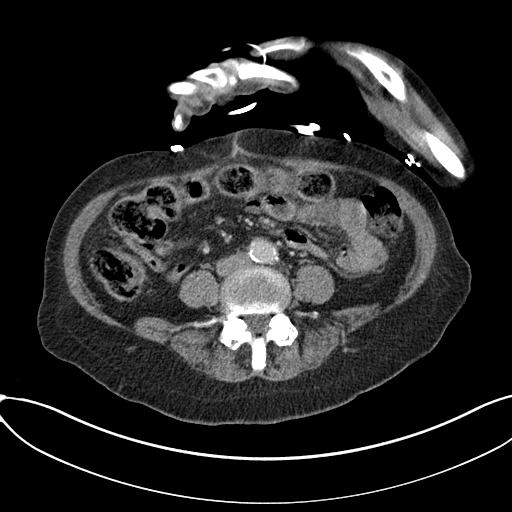
[im 54/88  soft-tissue]
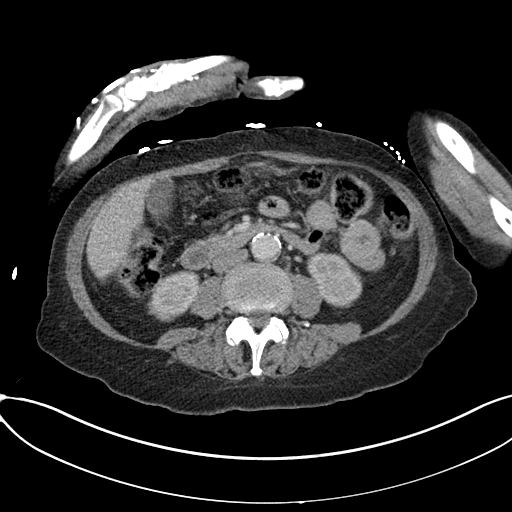
[im 61/88  soft-tissue]
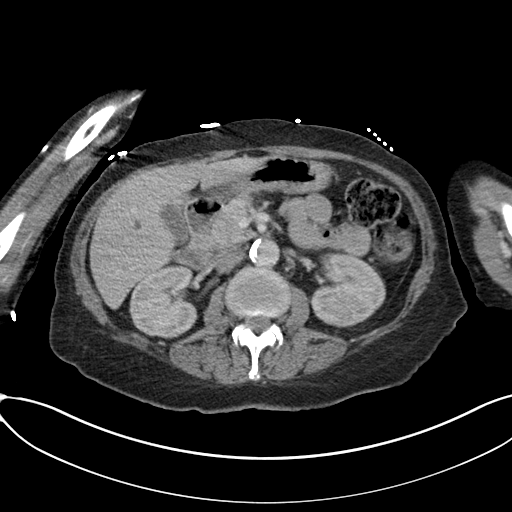
[im 61/88  bone]
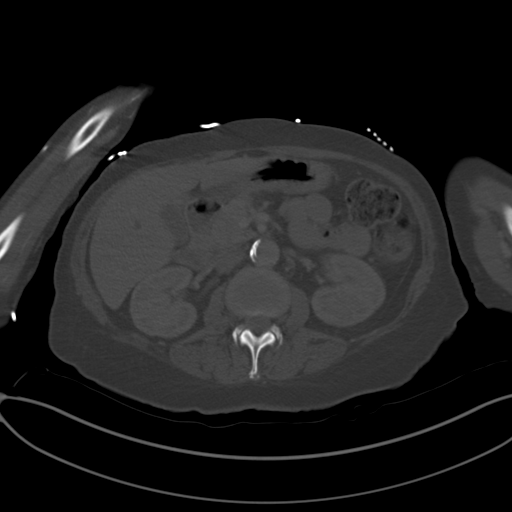
[im 67/88  soft-tissue]
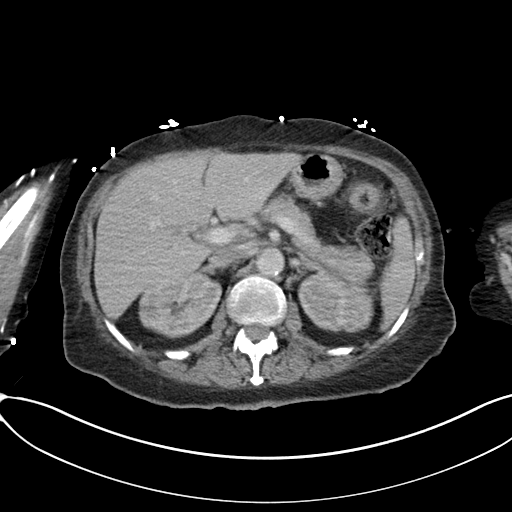
[im 74/88  soft-tissue]
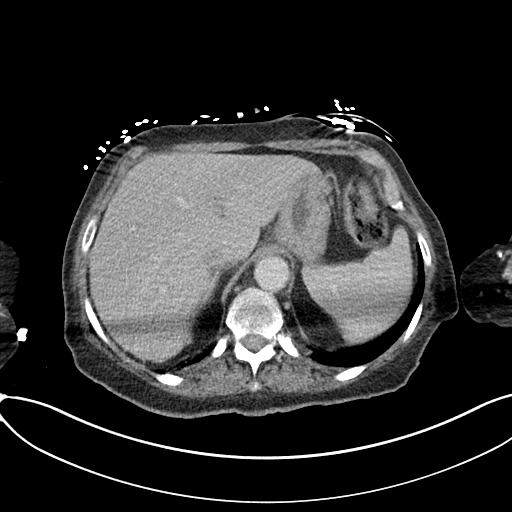
[im 81/88  soft-tissue]
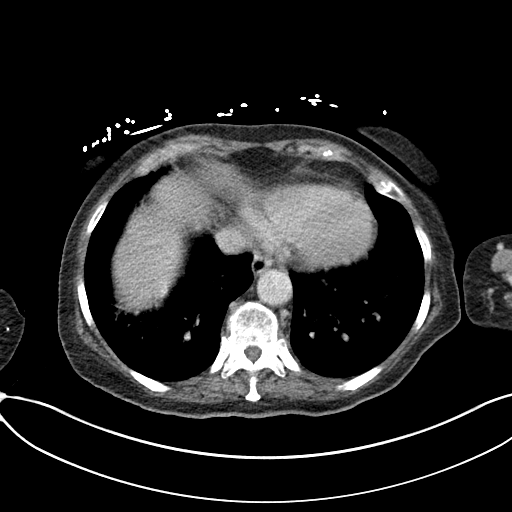

[Series 4: lung bases · axial · 0.79mm/px · 1 of 83 slices shown]
[im 7/83  bone]
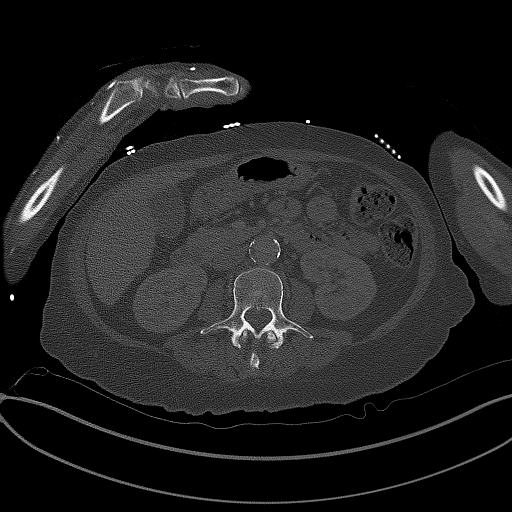

[Series 5: coronal st · coronal · 0.83mm/px · 3 of 120 slices shown]
[im 40/120  soft-tissue]
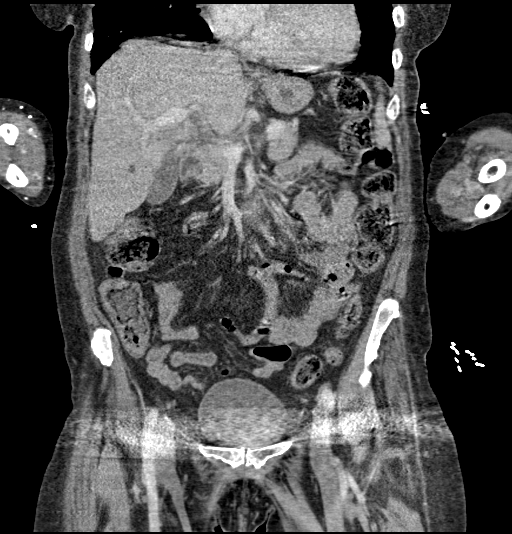
[im 53/120  soft-tissue]
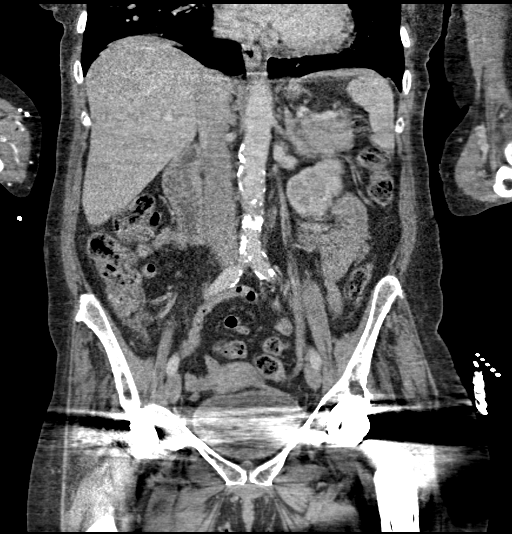
[im 67/120  soft-tissue]
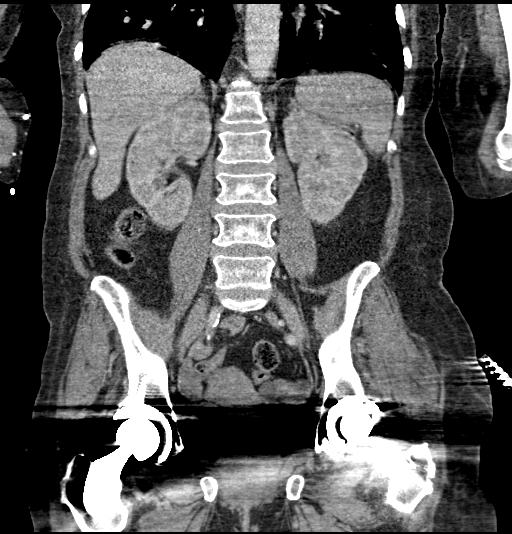

[16 of 46 positions shown; findings below may reference images not displayed]

FINDINGS: Lower chest: Image quality in the lung bases is degraded by
respiratory motion, limiting diagnostic quality. Heart size normal.
No pericardial or pleural effusion. Distal esophagus is
unremarkable.

Hepatobiliary: Subcentimeter low-attenuation lesion in the right
hepatic lobe is too small to characterize. Liver and gallbladder are
otherwise unremarkable. No biliary ductal dilatation.

Pancreas: Negative.

Spleen: Negative.

Adrenals/Urinary Tract: Adrenal glands are unremarkable.
Low-attenuation lesion in the right kidney measures 1.4 cm, too
small to characterize but likely a cyst, possibly minimally complex
with a peripheral calcification. Kidneys are otherwise unremarkable.
Ureters are decompressed. Bilateral hip arthroplasties create streak
artifact, obscuring the majority of the bladder.

Stomach/Bowel: Stomach, small bowel and colon are unremarkable.
Appendix is not definitely visualized.

Vascular/Lymphatic: Atherosclerotic calcification of the aorta. No
pathologically enlarged lymph nodes.

Reproductive: Uterus is visualized.  No adnexal mass.

Other: No free fluid.  Mesenteries and peritoneum are unremarkable.

Musculoskeletal: Bilateral hip arthroplasties. Degenerative changes
in the spine.
IMPRESSION: 1. No acute findings to explain the patient's abdominal pain.
2.  Aortic atherosclerosis ([4T]-[4T]).

## 2021-09-11 IMAGING — CT CT HEAD W/O CM
3 series · 14 of 47 positions shown, 16 images · non-contrast
Comparison: Brain MRI [DATE]

CLINICAL DATA: Altered mental status

EXAM:
CT HEAD WITHOUT CONTRAST
TECHNIQUE: Contiguous axial images were obtained from the base of the skull
through the vertex without intravenous contrast.

[Series 2: head wo · axial · 0.47mm/px · z∈[-173,-38]mm · 8 of 33 slices shown, 10 images]
[im 3/33  brain]
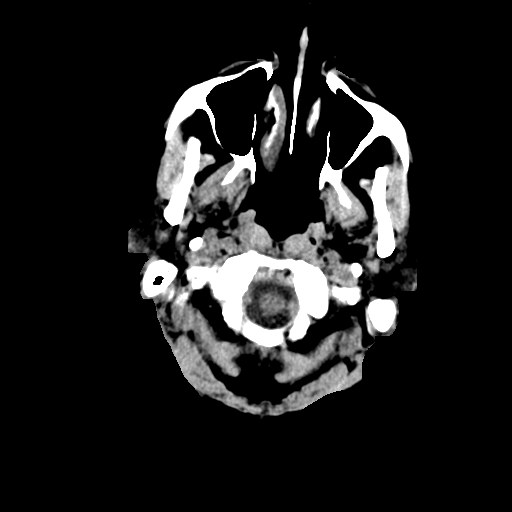
[im 3/33  bone]
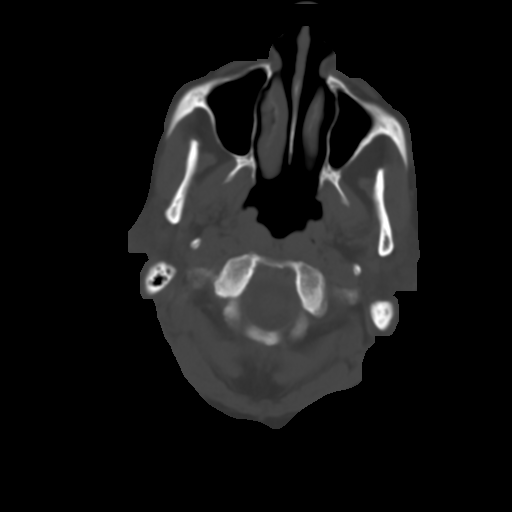
[im 7/33  brain]
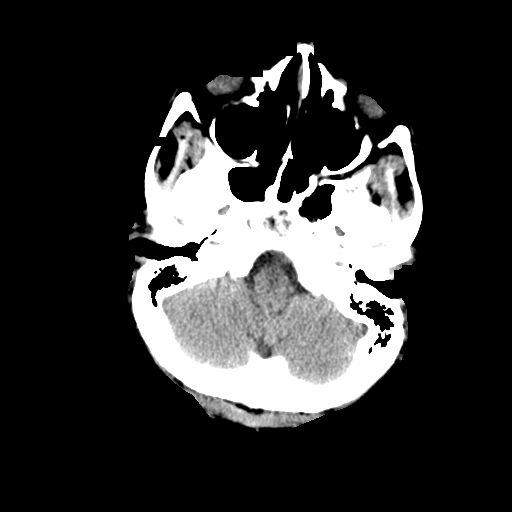
[im 10/33  brain]
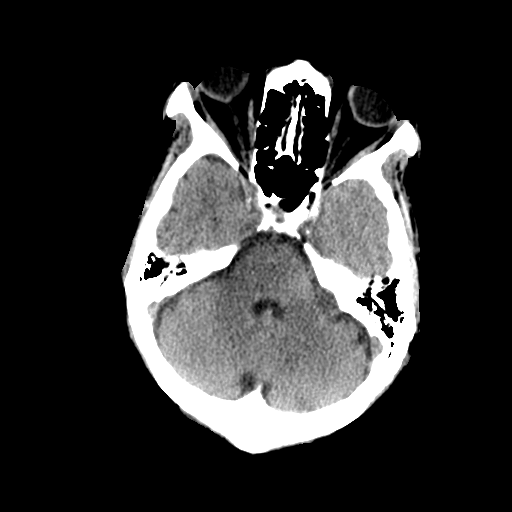
[im 15/33  brain]
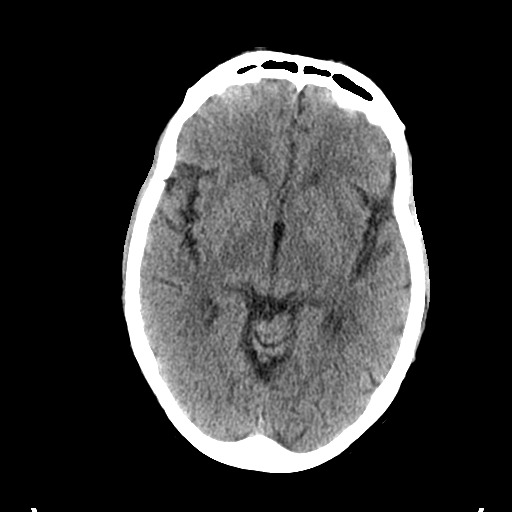
[im 18/33  brain]
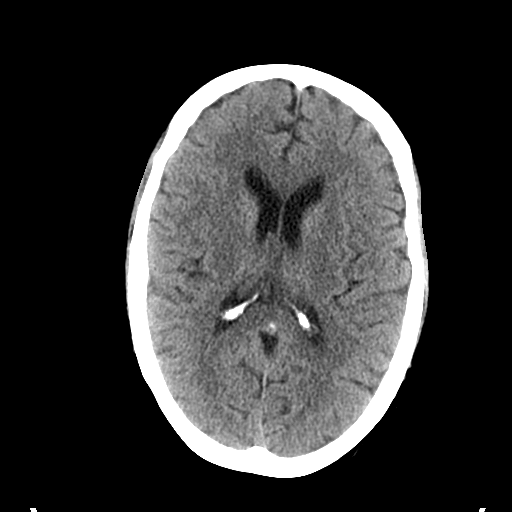
[im 18/33  bone]
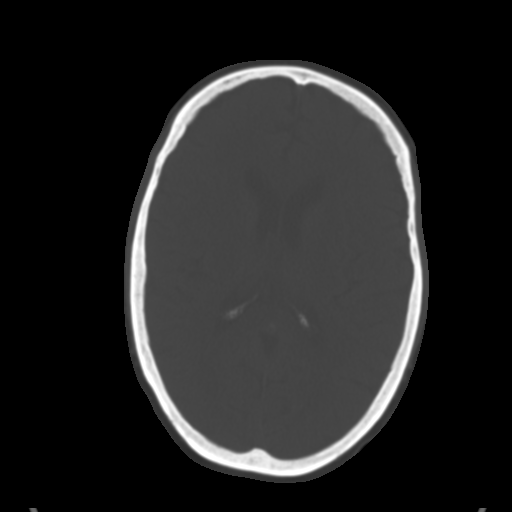
[im 23/33  brain]
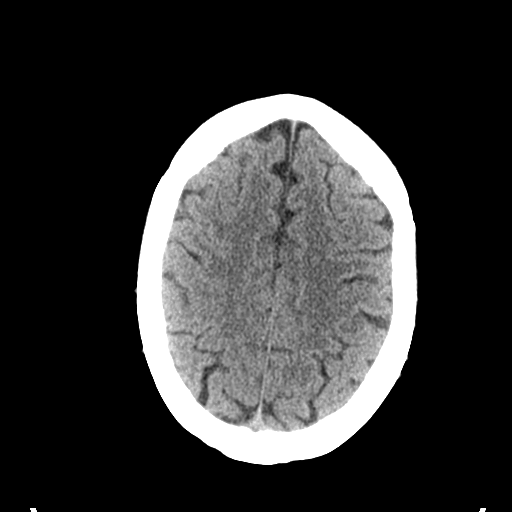
[im 26/33  brain]
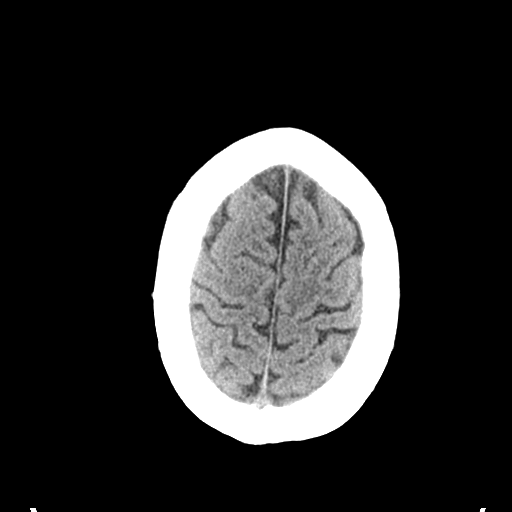
[im 30/33  brain]
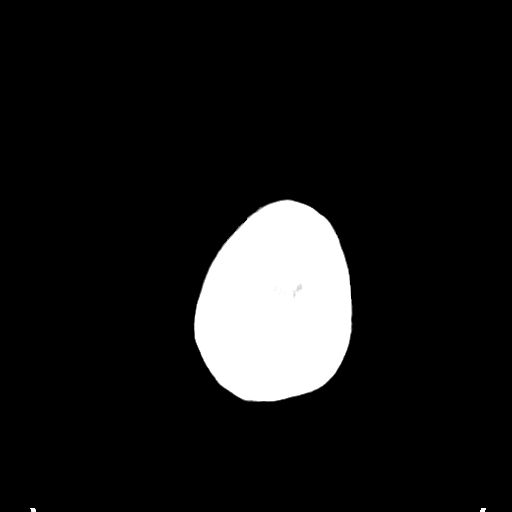

[Series 5: coronal soft tissue · coronal · 0.33mm/px · 3 of 75 slices shown]
[im 25/75  brain]
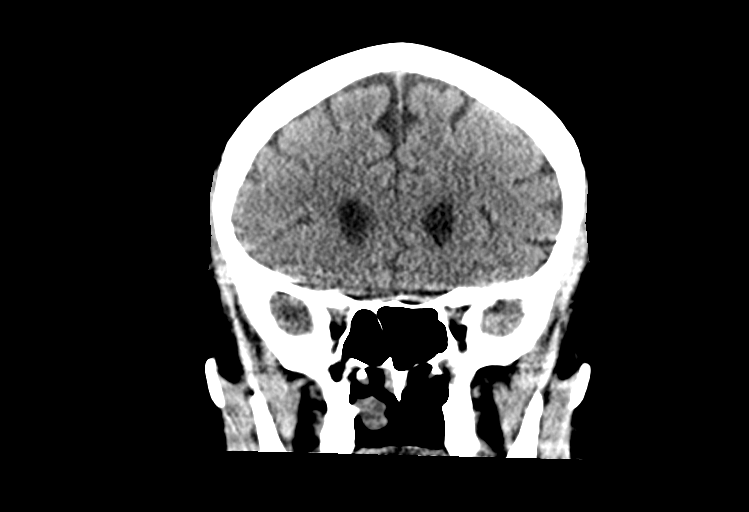
[im 33/75  brain]
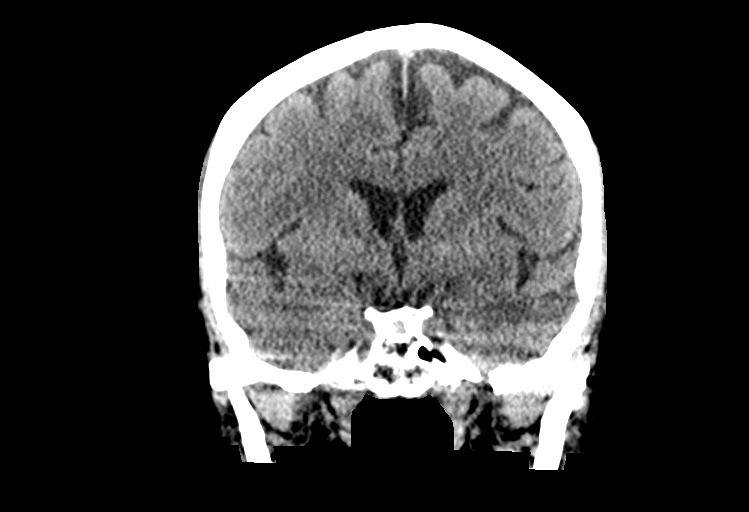
[im 42/75  brain]
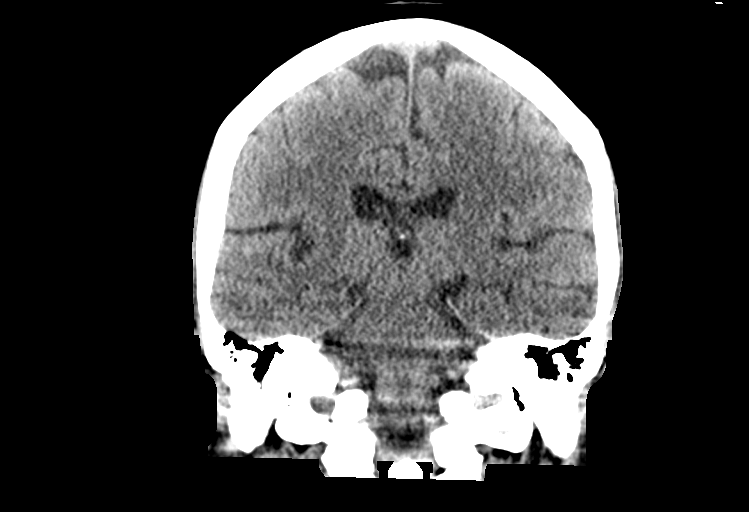

[Series 6: sagittal soft tissue · sagittal · 0.32mm/px · 3 of 53 slices shown]
[im 18/53  brain]
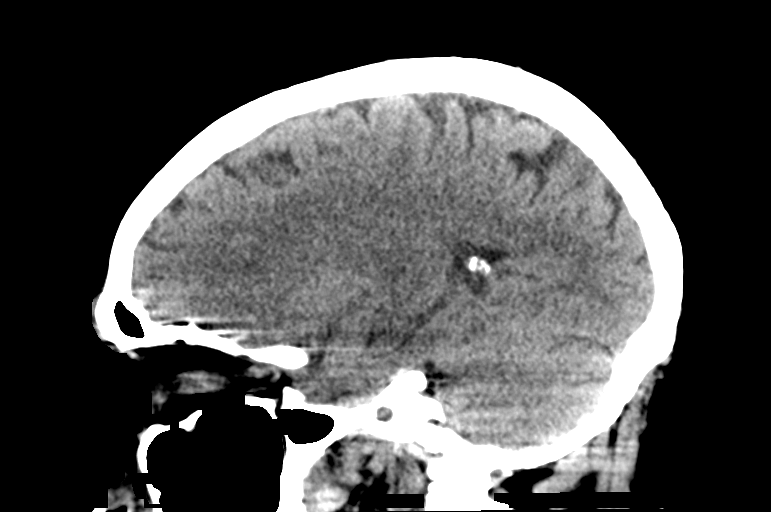
[im 27/53  brain]
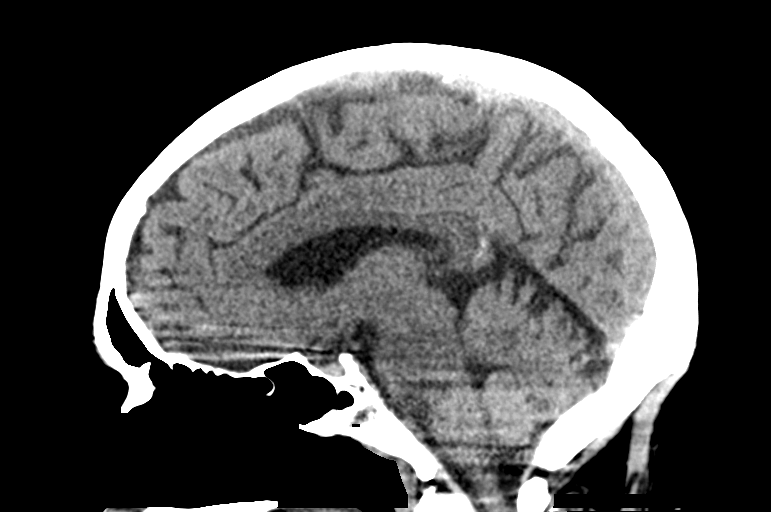
[im 35/53  brain]
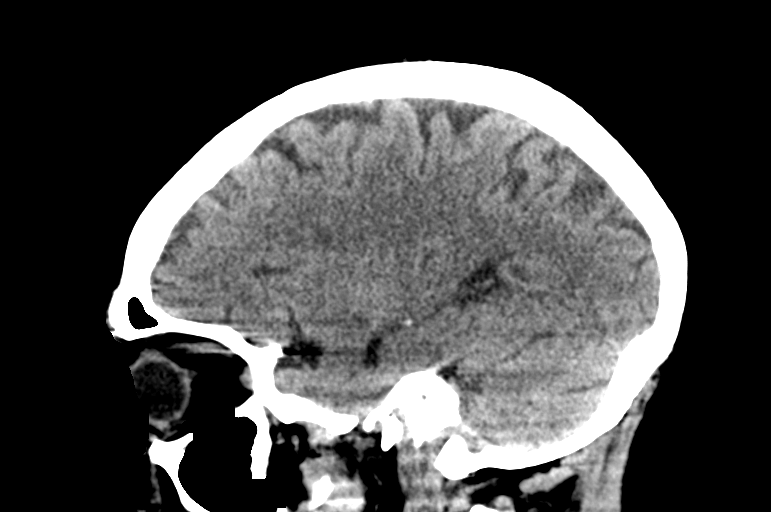

[14 of 47 positions shown; findings below may reference images not displayed]

FINDINGS: Brain: There is no acute intracranial hemorrhage, extra-axial fluid
collection, or acute infarct.

Parenchymal volume is normal.  The ventricles are stable in size.

There is no mass lesion.  There is no midline shift.

Vascular: No hyperdense vessel or unexpected calcification.

Skull: Normal. Negative for fracture or focal lesion.

Sinuses/Orbits: The imaged paranasal sinuses are clear. The globes
and orbits are unremarkable.

Other: None.
IMPRESSION: No acute intracranial pathology.

## 2021-09-11 IMAGING — CT CT ANGIO CHEST
2 of 6 series · 18 of 36 positions shown · IV contrast (omnipaque)
Comparison: PET-CT dated [DATE]. CTA chest dated [DATE].

CLINICAL DATA: Altered mental status.  History of lung cancer.

EXAM:
CT ANGIOGRAPHY CHEST WITH CONTRAST
TECHNIQUE: Multidetector CT imaging of the chest was performed using the
standard protocol during bolus administration of intravenous
contrast. Multiplanar CT image reconstructions and MIPs were
obtained to evaluate the vascular anatomy.
CONTRAST:  60mL OMNIPAQUE IOHEXOL 350 MG/ML SOLN

[Series 5: thins · axial · 0.72mm/px · z∈[-288,-21]mm · 17 of 301 slices shown]
[im 17/301  lung]
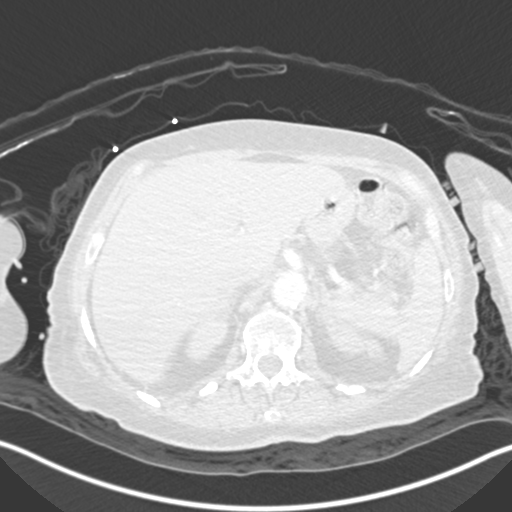
[im 34/301  mediastinal]
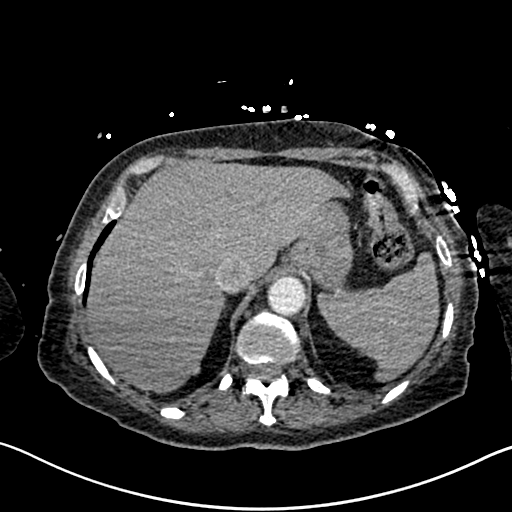
[im 51/301  lung]
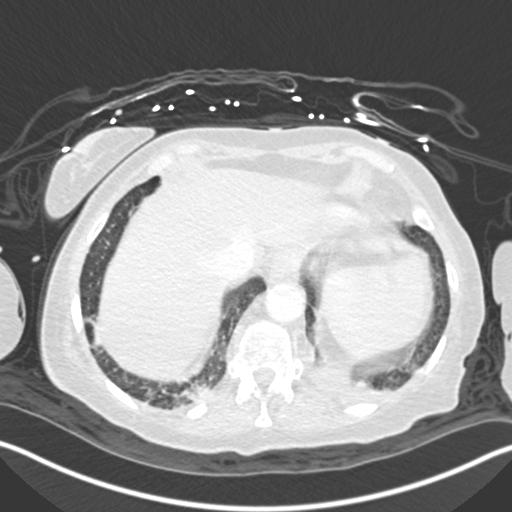
[im 67/301  mediastinal]
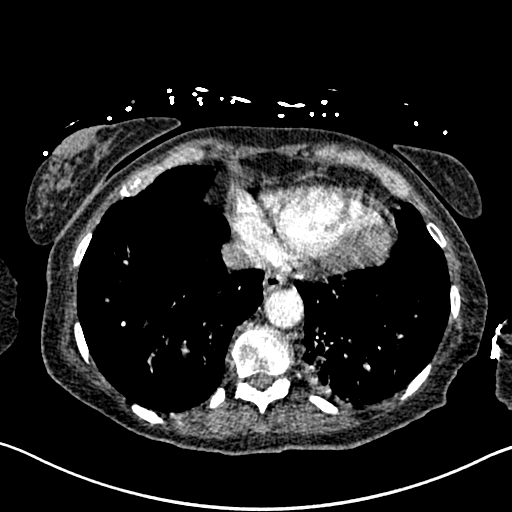
[im 84/301  lung]
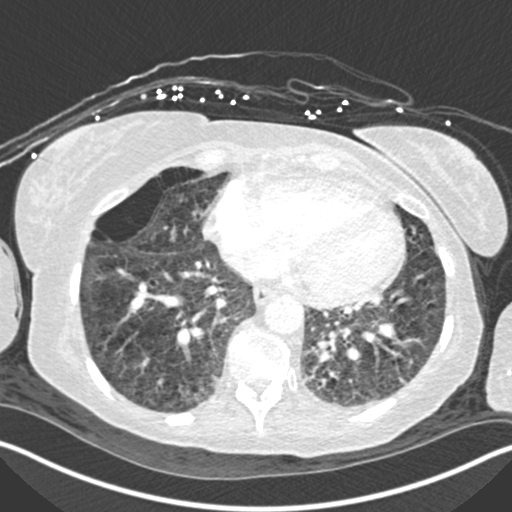
[im 101/301  mediastinal]
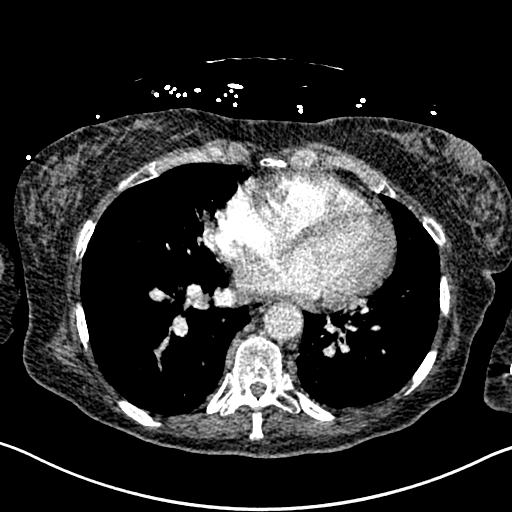
[im 117/301  lung]
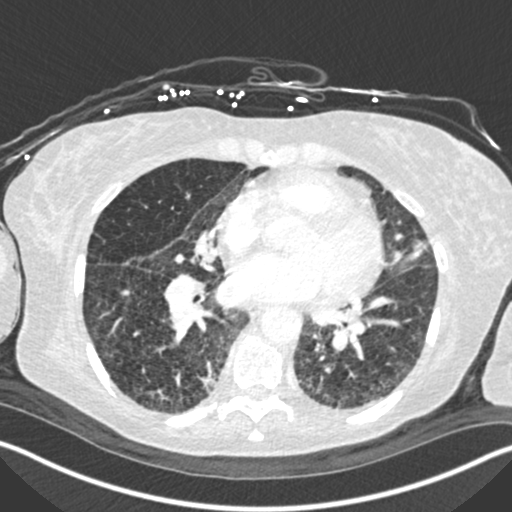
[im 134/301  mediastinal]
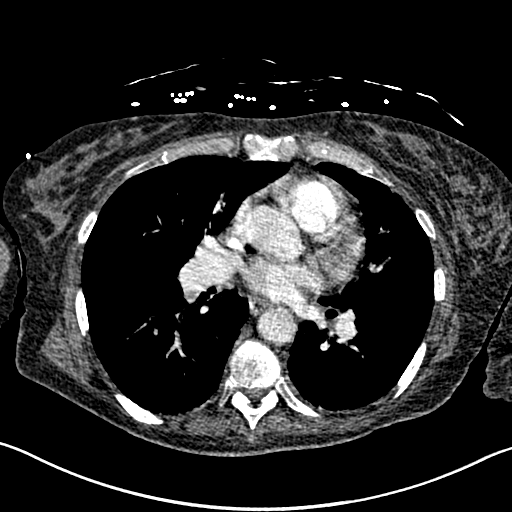
[im 151/301  lung]
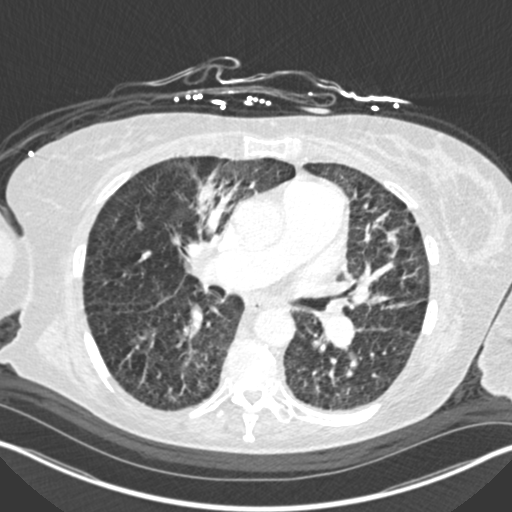
[im 167/301  mediastinal]
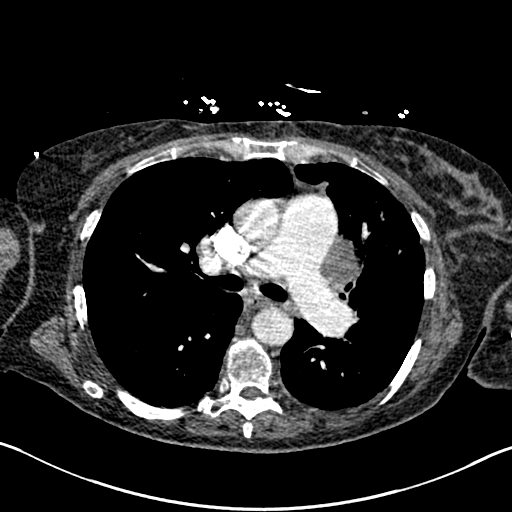
[im 184/301  lung]
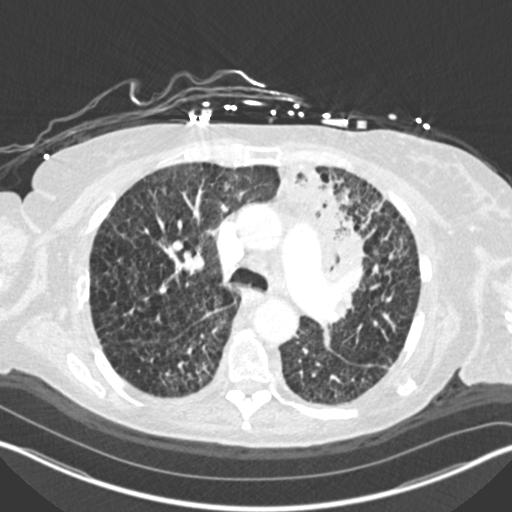
[im 201/301  mediastinal]
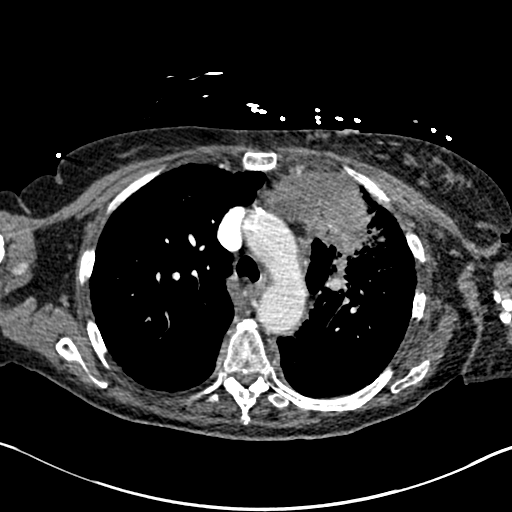
[im 217/301  lung]
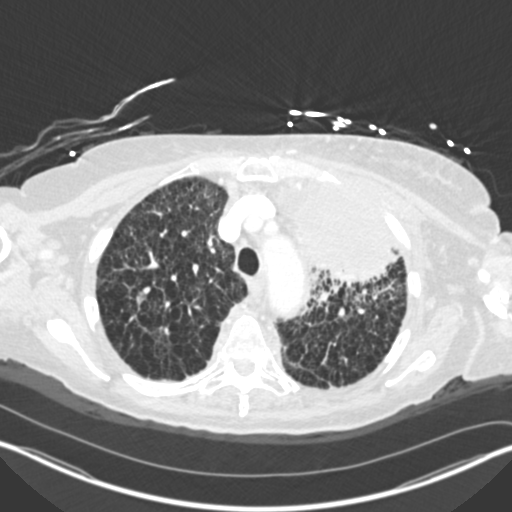
[im 234/301  mediastinal]
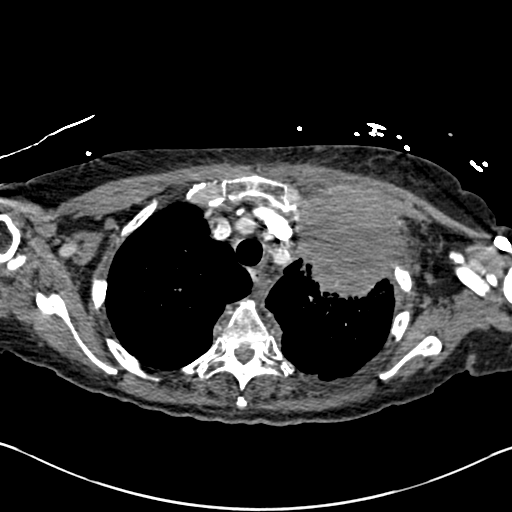
[im 251/301  lung]
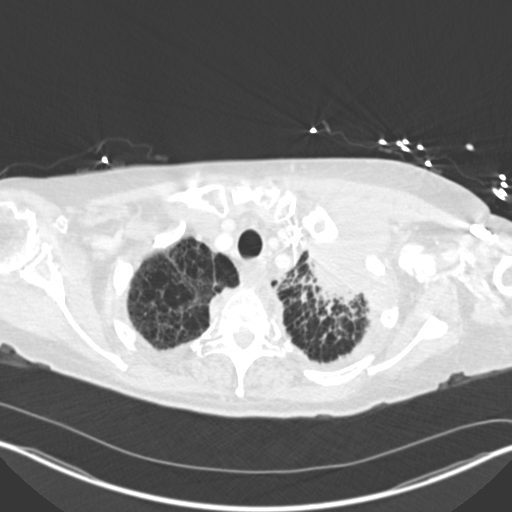
[im 267/301  mediastinal]
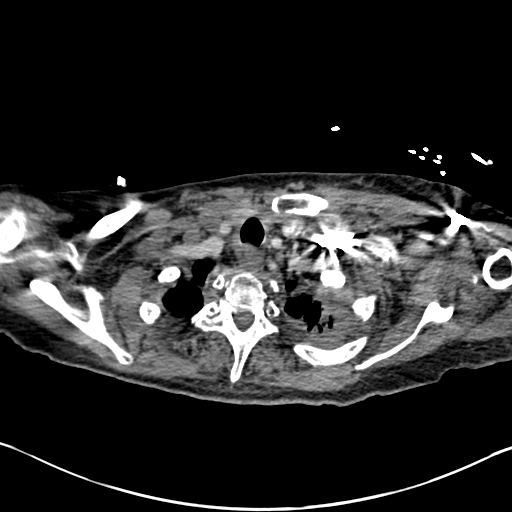
[im 284/301  lung]
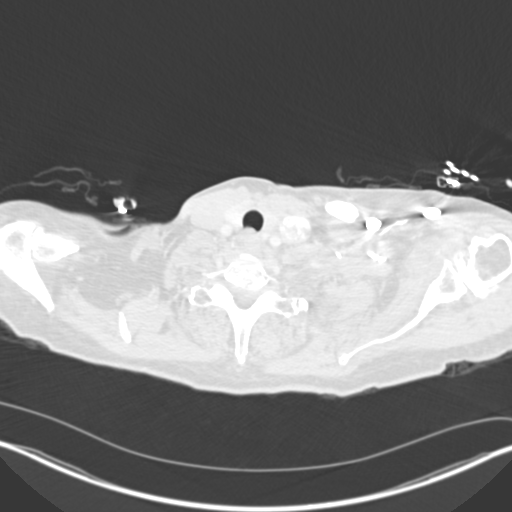

[Series 7: coronal mpr · coronal · 0.59mm/px · 1 of 125 slices shown]
[im 63/125  mediastinal]
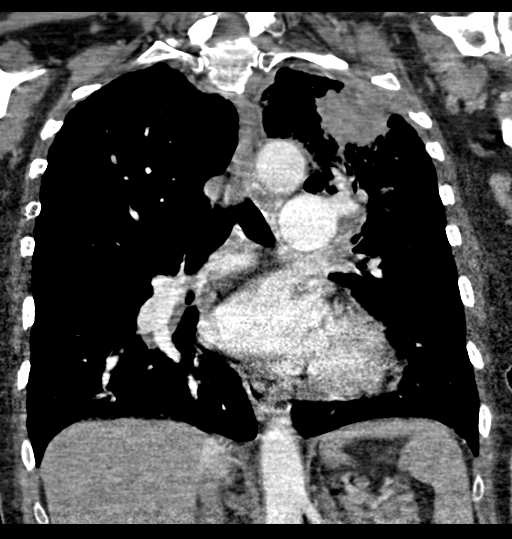

[18 of 36 positions shown; findings below may reference images not displayed]

FINDINGS: Cardiovascular: Satisfactory opacification of the pulmonary arteries
to the segmental level. No evidence of pulmonary embolism. Unchanged
enlarged central pulmonary arteries. Normal heart size. No
pericardial effusion. No thoracic aortic aneurysm or dissection.

Mediastinum/Nodes: Unchanged large left suprahilar lymph node
measuring 3.8 x 2.4 cm (series 4, image 67), previously 3.7 x
cm. The thyroid gland, trachea, and esophagus demonstrate no
significant findings.

Lungs/Pleura: Enlarging anterior left upper lobe mass with similar
mediastinal invasion, but progressive destruction of the anterior
left second rib. New perilymphatic nodularity in the left upper lobe
adjacent to the mass. Unchanged advanced centrilobular emphysema.
Trace left pleural effusion. Minimal subsegmental atelectasis in the
left greater than right lower lobes. No pneumothorax.

Upper Abdomen: No acute abnormality.

Musculoskeletal: Progressive destruction of the anterior left second
rib.

Review of the MIP images confirms the above findings.
IMPRESSION: 1. No evidence of pulmonary embolism.
2. Progressive left upper lobe primary bronchogenic carcinoma with
increased destruction of the anterior left second rib. New
perilymphatic nodularity in the left upper lobe adjacent to the
mass, consistent with lymphangitic carcinomatosis.
3. Unchanged and left suprahilar nodal metastatic disease.
4. Trace left pleural effusion.

## 2021-09-11 IMAGING — DX DG CHEST 1V PORT
1 series · 1 of 1 positions shown · non-contrast
Comparison: Comparison made with prior imaging studies from [DATE].

CLINICAL DATA: Altered mental status in a patient with history of
pulmonary neoplasm.

EXAM:
PORTABLE CHEST 1 VIEW

[chest ap]
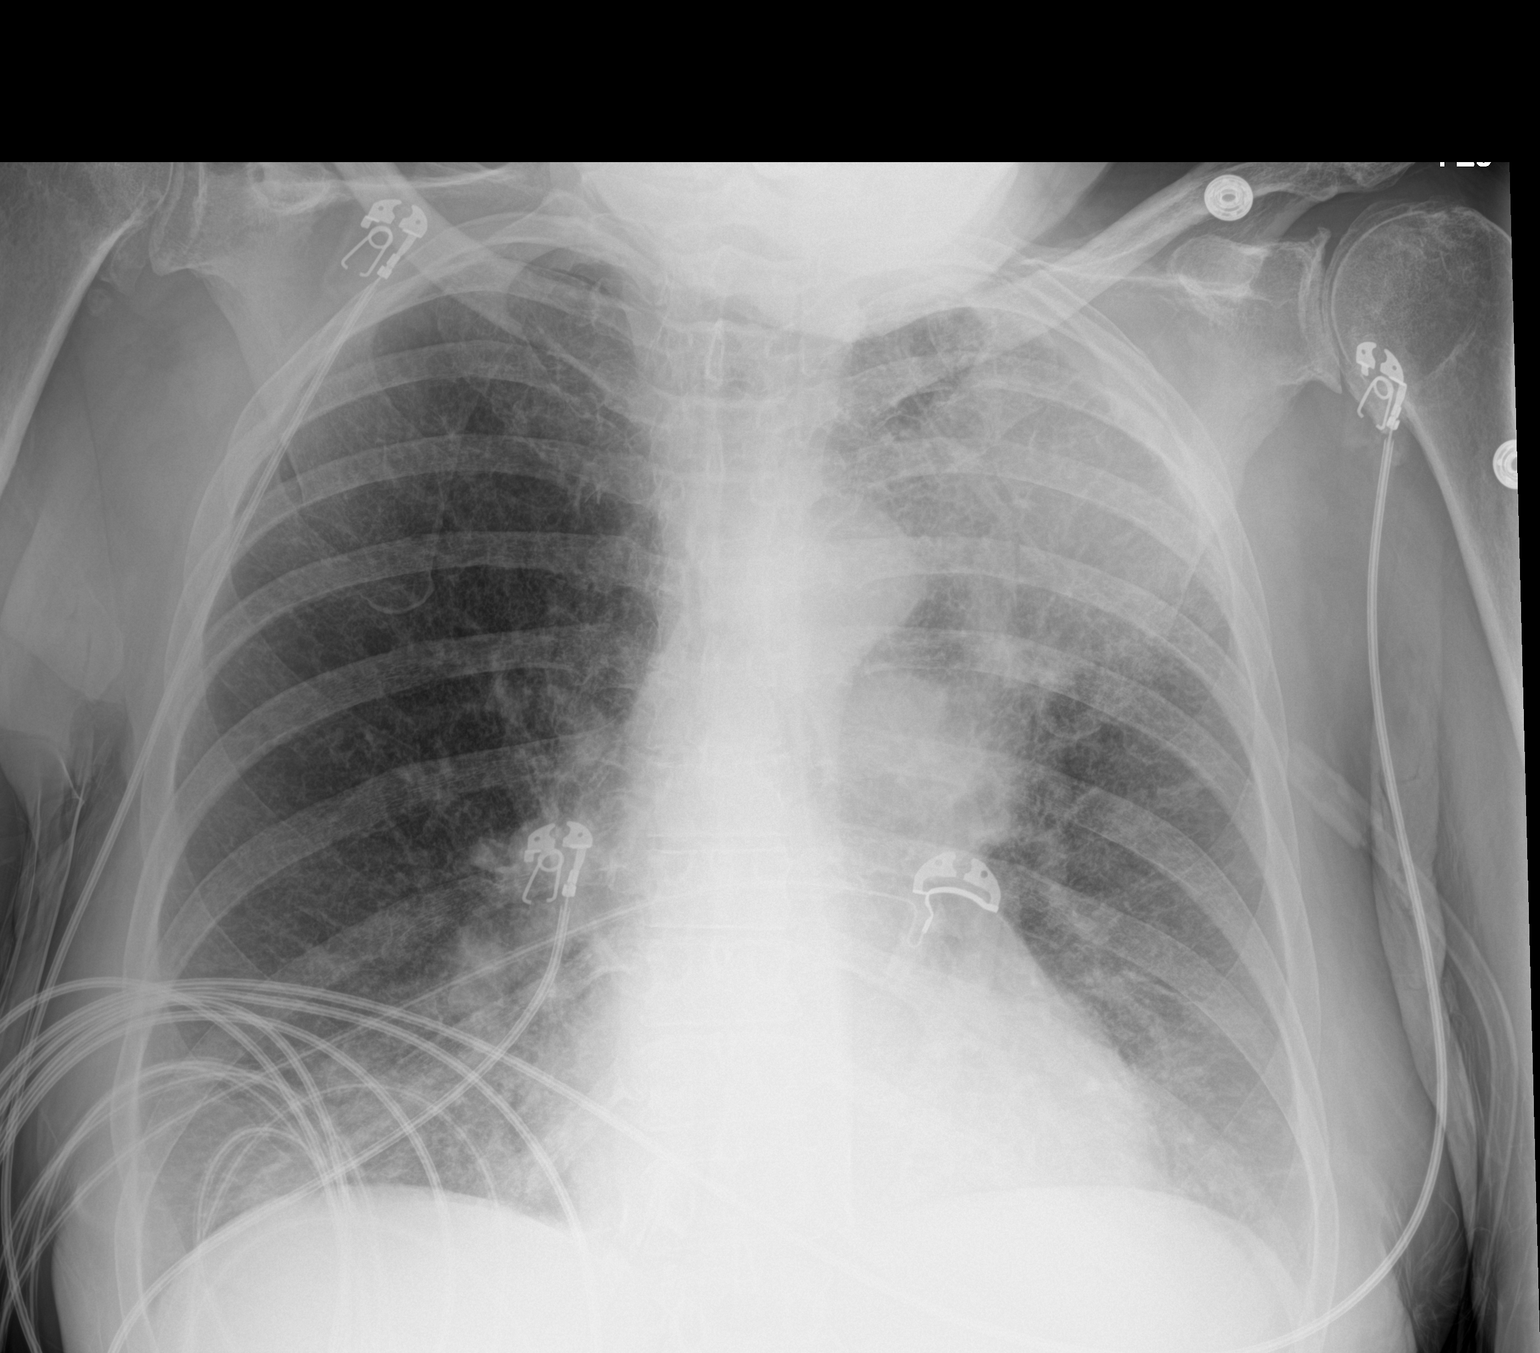

[1 of 1 positions shown; findings below may reference images not displayed]

FINDINGS: EKG leads project over the chest.

Trachea is midline. Cardiomediastinal contours and hilar structures
are stable with fullness of the LEFT hilum and increased density
about the LEFT upper chest compatible with known pulmonary neoplasm.
No sign of lobar consolidation or visible pneumothorax.

On limited assessment there is no acute skeletal process. Rib
destruction of LEFT anterior ribs not as well demonstrated as on
prior CT imaging.
IMPRESSION: Stable findings of neoplasm in the LEFT chest. No acute
cardiopulmonary disease.

## 2021-09-11 MED ORDER — IOHEXOL 350 MG/ML SOLN
80.0000 mL | Freq: Once | INTRAVENOUS | Status: AC | PRN
  Administered 2021-09-11: 80 mL via INTRAVENOUS

## 2021-09-11 MED ORDER — LACTATED RINGERS IV BOLUS (SEPSIS)
1000.0000 mL | Freq: Once | INTRAVENOUS | Status: AC
Start: 2021-09-11 — End: 2021-09-11
  Administered 2021-09-11: 1000 mL via INTRAVENOUS

## 2021-09-11 MED ORDER — ORAL CARE MOUTH RINSE
15.0000 mL | Freq: Two times a day (BID) | OROMUCOSAL | Status: DC
Start: 2021-09-12 — End: 2021-09-29
  Administered 2021-09-14 – 2021-09-29 (×24): 15 mL via OROMUCOSAL

## 2021-09-11 MED ORDER — LACTATED RINGERS IV BOLUS (SEPSIS)
1000.0000 mL | Freq: Once | INTRAVENOUS | Status: AC
  Administered 2021-09-11: 1000 mL via INTRAVENOUS

## 2021-09-11 MED ORDER — VANCOMYCIN HCL 750 MG/150ML IV SOLN
750.0000 mg | Freq: Two times a day (BID) | INTRAVENOUS | Status: DC
Start: 2021-09-12 — End: 2021-09-13
  Administered 2021-09-11 – 2021-09-13 (×4): 750 mg via INTRAVENOUS
  Filled 2021-09-11 (×4): qty 150

## 2021-09-11 MED ORDER — ACETAMINOPHEN 325 MG PO TABS
650.0000 mg | ORAL_TABLET | Freq: Four times a day (QID) | ORAL | Status: DC | PRN
  Administered 2021-09-14 – 2021-09-28 (×10): 650 mg via ORAL
  Filled 2021-09-11 (×10): qty 2

## 2021-09-11 MED ORDER — IOHEXOL 350 MG/ML SOLN
60.0000 mL | Freq: Once | INTRAVENOUS | Status: AC | PRN
  Administered 2021-09-11: 60 mL via INTRAVENOUS

## 2021-09-11 MED ORDER — VANCOMYCIN HCL 1500 MG/300ML IV SOLN
1500.0000 mg | Freq: Once | INTRAVENOUS | Status: AC
  Administered 2021-09-11: 1500 mg via INTRAVENOUS
  Filled 2021-09-11: qty 300

## 2021-09-11 MED ORDER — ONDANSETRON HCL 4 MG PO TABS: 4.0000 mg | ORAL_TABLET | Freq: Four times a day (QID) | ORAL | Status: DC | PRN

## 2021-09-11 MED ORDER — METRONIDAZOLE 500 MG/100ML IV SOLN
500.0000 mg | Freq: Once | INTRAVENOUS | Status: AC
  Administered 2021-09-11: 500 mg via INTRAVENOUS
  Filled 2021-09-11: qty 100

## 2021-09-11 MED ORDER — ONDANSETRON HCL 4 MG/2ML IJ SOLN
4.0000 mg | Freq: Four times a day (QID) | INTRAMUSCULAR | Status: DC | PRN
  Administered 2021-09-22 – 2021-09-26 (×4): 4 mg via INTRAVENOUS
  Filled 2021-09-11 (×4): qty 2

## 2021-09-11 MED ORDER — SODIUM CHLORIDE 0.9 % IV SOLN
2.0000 g | Freq: Three times a day (TID) | INTRAVENOUS | Status: DC
  Administered 2021-09-11 – 2021-09-13 (×5): 2 g via INTRAVENOUS
  Filled 2021-09-11 (×6): qty 2

## 2021-09-11 MED ORDER — ACETAMINOPHEN 325 MG PO TABS: 325.0000 mg | ORAL_TABLET | Freq: Once | ORAL | Status: DC

## 2021-09-11 MED ORDER — METRONIDAZOLE 500 MG/100ML IV SOLN
500.0000 mg | Freq: Two times a day (BID) | INTRAVENOUS | Status: DC
  Administered 2021-09-11 – 2021-09-13 (×3): 500 mg via INTRAVENOUS
  Filled 2021-09-11 (×3): qty 100

## 2021-09-11 MED ORDER — LORAZEPAM 2 MG/ML IJ SOLN
0.5000 mg | Freq: Once | INTRAMUSCULAR | Status: AC
  Administered 2021-09-11: 0.5 mg via INTRAVENOUS
  Filled 2021-09-11: qty 1

## 2021-09-11 MED ORDER — CHLORHEXIDINE GLUCONATE 0.12 % MT SOLN
15.0000 mL | Freq: Two times a day (BID) | OROMUCOSAL | Status: DC
  Administered 2021-09-14 – 2021-09-29 (×29): 15 mL via OROMUCOSAL
  Filled 2021-09-11 (×28): qty 15

## 2021-09-11 MED ORDER — SODIUM CHLORIDE 0.9 % IV SOLN
2.0000 g | Freq: Once | INTRAVENOUS | Status: AC
  Administered 2021-09-11: 2 g via INTRAVENOUS
  Filled 2021-09-11: qty 2

## 2021-09-11 MED ORDER — SODIUM CHLORIDE 0.9 % IV BOLUS
1000.0000 mL | Freq: Once | INTRAVENOUS | Status: AC
  Administered 2021-09-11: 1000 mL via INTRAVENOUS

## 2021-09-11 MED ORDER — ACETAMINOPHEN 650 MG RE SUPP
650.0000 mg | Freq: Four times a day (QID) | RECTAL | Status: DC | PRN
  Administered 2021-09-13 – 2021-09-15 (×2): 650 mg via RECTAL
  Filled 2021-09-11 (×3): qty 1

## 2021-09-11 MED ORDER — LACTATED RINGERS IV SOLN: INTRAVENOUS | Status: AC

## 2021-09-11 MED ORDER — OXYCODONE HCL 5 MG PO TABS: 5.0000 mg | ORAL_TABLET | Freq: Once | ORAL | Status: DC

## 2021-09-11 MED ORDER — LACTATED RINGERS IV BOLUS (SEPSIS)
250.0000 mL | Freq: Once | INTRAVENOUS | Status: AC
  Administered 2021-09-11: 250 mL via INTRAVENOUS

## 2021-09-11 MED ORDER — VANCOMYCIN HCL IN DEXTROSE 1-5 GM/200ML-% IV SOLN
1000.0000 mg | Freq: Once | INTRAVENOUS | Status: DC
Start: 2021-09-11 — End: 2021-09-11

## 2021-09-11 MED ORDER — ACETAMINOPHEN 500 MG PO TABS
1000.0000 mg | ORAL_TABLET | Freq: Once | ORAL | Status: DC
  Filled 2021-09-11: qty 2

## 2021-09-11 MED ORDER — CHLORHEXIDINE GLUCONATE CLOTH 2 % EX PADS
6.0000 | MEDICATED_PAD | Freq: Every day | CUTANEOUS | Status: DC
  Administered 2021-09-11 – 2021-09-16 (×6): 6 via TOPICAL

## 2021-09-11 NOTE — Progress Notes (Signed)
Pharmacy Antibiotic Note  RIANNE DEGRAAF is a 65 y.o. female admitted on 09/10/2021 with sepsis.  Pharmacy has been consulted for vancomycin & cefepime dosing.  Plan: Cefepime 2 gm IV q8h Vancomycin 1500 mg IV x 1 followed by vancomycin 750 mg IV q12h for est AUC 475 using Vd 0.72, SCr 0.95 F/u renal function, WBC, temp, culture data Vancomycin levels as needed    Temp (24hrs), Avg:99.6 F (37.6 C), Min:99 F (37.2 C), Max:100.2 F (37.9 C)  Recent Labs  Lab 09/12/2021 1155 09/09/2021 1201  WBC 2.0*  --   CREATININE 0.95 0.80  LATICACIDVEN 1.7  --     Estimated Creatinine Clearance: 77.8 mL/min (by C-G formula based on SCr of 0.8 mg/dL).    Allergies  Allergen Reactions   Dyazide [Hydrochlorothiazide W-Triamterene]     hives   Zestoretic [Lisinopril-Hydrochlorothiazide]     Swelling    Antimicrobials this admission:  10/17 Vanc >> 10/17 cefepime >> 10/17 flagyl x 1 Dose adjustments this admission:  Microbiology results:  10/17 UCx sent 10/17 BCx2 sent Thank you for allowing pharmacy to be a part of this patient's care.   Eudelia Bunch, Pharm.D 09/09/2021 5:35 PM

## 2021-09-11 NOTE — ED Provider Notes (Signed)
[facility] DEPT Provider Note   CSN: [id number] Arrival date & time: 09/20/2021  1025     History Chief Complaint  Patient presents with   Altered Mental Status    Patricia Sampson is a 65 y.o. female.  Patricia Sampson is a 65 y.o. female with a history of stage adenocarcinoma of the left lung, hypertension, hyperlipidemia, migraines, COPD, who presents to the emergency department for evaluation of altered mental status.  Family reports at 7:30 when they went to get pt up she was sitting up with her eyes open but was not responding to her son when he tried to talk to her.  Reports she was awake and alert but did not really seem to be there.  He did not notice any seizure-like activity that she is having some intermittent tremulousness in her hands.  She was never unconscious.  Patient reports that when she went to bed at 10 PM last night she was in her usual state of health and when the husband left for work around 615 she was asleep.  Family reports she is currently undergoing chemotherapy and radiation for her lung cancer.  She has no known history of brain metastasis.  Has never had any similar episodes of altered mental status like this.  Patient is unable to provide any history.  She does not verbally respond to any of my questions but is able to follow some commands.  Patient is on 6 L nasal cannula chronically.  Level 5 caveat: Altered mental status  The history is provided by the patient, a relative and medical records.      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 No pertinent surgical history.   OB History   No obstetric history on file.     Family History  Problem Relation Age of Onset   Diabetes Mellitus II Father    Coronary artery disease Mother    Hypertension Mother    COPD Mother     Social History   Tobacco Use   Smoking status: Former    Packs/day: 1.00    Years: 30.00    Pack years: 30.00    Types: Cigarettes    Quit date: 11/26/2000    Years since quitting: 20.8   Smokeless tobacco: Never  Substance Use Topics   Alcohol use: No   Drug use: No    Home Medications Prior to Admission medications   Medication Sig Start Date End Date Taking? Authorizing Provider  acetaminophen (TYLENOL) 650 MG CR tablet Take 650-1,300 mg by mouth every 8 (eight) hours as needed for pain.   Yes [provider]  furosemide (LASIX) 20 MG tablet Take 20 mg by mouth daily. 05/18/21  Yes [provider]  gabapentin (NEURONTIN) 100 MG capsule Take 100  mg by mouth at bedtime. 07/17/21  Yes [provider]  Magnesium 250 MG TABS Take 250 mg by mouth daily.   Yes [provider]  potassium chloride (MICRO-K) 10 MEQ CR capsule Take 10 mEq by mouth 2 (two) times daily.   Yes [provider]  prochlorperazine (COMPAZINE) 10 MG tablet Take 1 tablet (10 mg total) by mouth every 6 (six) hours as needed for nausea or vomiting. 07/19/21  Yes Curt Bears, MD  rosuvastatin (CRESTOR) 10 MG tablet Take 10 mg by mouth daily.   Yes [provider]  sucralfate (CARAFATE) 1 g tablet Take 1 tablet (1 g total) by mouth 4 (four) times daily. Dissolve each tablet in 15 cc water before use. 09/01/21  Yes Kyung Rudd, MD  albuterol (VENTOLIN HFA) 108 (90 Base) MCG/ACT inhaler Inhale 2 puffs into the lungs every 4 (four) hours as needed for wheezing or shortness of breath. Patient not taking: No sig reported 01/07/20   Collene Gobble, MD  amitriptyline (ELAVIL) 10 MG tablet Take 10 mg by mouth daily.    [provider]  nystatin (MYCOSTATIN) 100000 UNIT/ML suspension Take 5 mLs (500,000 Units total) by mouth 4 (four) times daily. Patient not taking: No sig reported 07/25/21   Hayden Pedro, PA-C  oxyCODONE-acetaminophen (PERCOCET/ROXICET) 5-325 MG tablet Take 1 tablet by mouth every 6 (six) hours as needed for severe pain. Patient not taking: No sig reported 08/04/21   Heilingoetter, Cassandra L, PA-C  SYMBICORT 160-4.5 MCG/ACT inhaler TAKE 2 PUFFS BY MOUTH TWICE A DAY Patient not taking: No sig reported 01/24/21   Collene Gobble, MD  Tiotropium Bromide Monohydrate (SPIRIVA RESPIMAT) 2.5 MCG/ACT AERS Inhale 2 puffs into the lungs daily. Patient not taking: No sig reported 04/15/20   Collene Gobble, MD    Allergies    Dyazide [hydrochlorothiazide w-triamterene] and Zestoretic [lisinopril-hydrochlorothiazide]  Review of Systems   Review of Systems  Unable to perform ROS: Mental status change   Physical Exam Updated Vital Signs BP 134/74 (BP Location: Left Arm)   Pulse (!) 140   Temp 99 F (37.2 C) (Oral)   Resp (!) 22   SpO2 97%   Physical Exam Vitals and nursing note reviewed.  Constitutional:      General: She is not in acute distress.    Appearance: Normal appearance. She is well-developed. She is ill-appearing. She is not diaphoretic.     Comments: Alert but not verbally responding or answering questions but can follow some commands, patient appears very anxious and ill-appearing  HENT:     Head: Normocephalic and atraumatic.     Mouth/Throat:     Mouth: Mucous membranes are moist.     Pharynx: Oropharynx is clear.  Eyes:     General:        Right eye: No discharge.         Left eye: No discharge.     Extraocular Movements: Extraocular movements intact.     Pupils: Pupils are equal, round, and reactive to light.  Cardiovascular:     Rate and Rhythm: Regular rhythm. Tachycardia present.     Pulses: Normal pulses.     Heart sounds: Normal heart sounds.     Comments: Tachycardic with regular rhythm Pulmonary:     Effort: Pulmonary effort is normal. No respiratory distress.     Breath sounds: Normal breath sounds. No wheezing or rales.     Comments: On chronic 6 L nasal cannula, respirations equal and  unlabored,  lungs clear to auscultation bilaterally with decreased air movement Abdominal:     General: Bowel sounds are normal. There is no distension.     Palpations: Abdomen is soft. There is no mass.     Tenderness: There is abdominal tenderness. There is no guarding.     Comments: Abdomen soft, nondistended, patient does have tenderness primarily across the lower abdomen with some guarding  Musculoskeletal:        General: No deformity.     Cervical back: Neck supple.     Right lower leg: No edema.     Left lower leg: No edema.  Skin:    General: Skin is warm and dry.     Capillary Refill: Capillary refill takes less than 2 seconds.     Findings: No rash.  Neurological:     Mental Status: She is alert and oriented to person, place, and time.     Coordination: Coordination normal.     Comments: Alert but not responding verbally, able to follow some commands CN III-XII intact Normal strength in upper and lower extremities bilaterally including dorsiflexion and plantar flexion, strong and equal grip strength Unable to assess sensation due to altered mental status Moves extremities without ataxia, coordination intact  Psychiatric:        Mood and Affect: Mood is anxious.    ED Results / Procedures / Treatments   Labs (all labs ordered are listed, but only abnormal results are displayed) Labs Reviewed  COMPREHENSIVE METABOLIC PANEL - Abnormal;  Notable for the following components:      Result Value   Chloride 86 (*)    CO2 38 (*)    Glucose, Bld 107 (*)    Calcium 8.6 (*)    Albumin 2.7 (*)    All other components within normal limits  CBC WITH DIFFERENTIAL/PLATELET - Abnormal; Notable for the following components:   WBC 2.0 (*)    Hemoglobin 10.0 (*)    HCT 33.6 (*)    MCH 25.1 (*)    MCHC 29.8 (*)    RDW 18.3 (*)    Platelets 139 (*)    Neutro Abs 1.5 (*)    Lymphs Abs 0.3 (*)    All other components within normal limits  RAPID URINE DRUG SCREEN, HOSP PERFORMED - Abnormal; Notable for the following components:   Opiates POSITIVE (*)    All other components within normal limits  I-STAT CHEM 8, ED - Abnormal; Notable for the following components:   Chloride 86 (*)    Glucose, Bld 106 (*)    Calcium, Ion 1.09 (*)    TCO2 37 (*)    Hemoglobin 10.9 (*)    HCT 32.0 (*)    All other components within normal limits  RESP PANEL BY RT-PCR (FLU A&B, COVID) ARPGX2  CULTURE, BLOOD (ROUTINE X 2)  CULTURE, BLOOD (ROUTINE X 2)  URINE CULTURE  LACTIC ACID, PLASMA  AMMONIA  PROTIME-INR  APTT  URINALYSIS, ROUTINE W REFLEX MICROSCOPIC    EKG EKG Interpretation  Date/Time:  Monday September 11 2021 11:00:43 EDT Ventricular Rate:  128 PR Interval:  124 QRS Duration: 72 QT Interval:  317 QTC Calculation: 463 R Axis:   76 Text Interpretation: Sinus tachycardia with irregular rate Consider right atrial enlargement rate faster since previous Confirmed by Wandra Arthurs [phone]) on 08/28/2021 3:40:22 PM  Radiology CT Head Wo Contrast  Result Date: 09/14/2021 CLINICAL DATA:  Altered mental status EXAM: CT HEAD WITHOUT CONTRAST TECHNIQUE: Contiguous  axial images were obtained from the base of the skull through the vertex without intravenous contrast. COMPARISON:  Brain MRI 07/26/2021 FINDINGS: Brain: There is no acute intracranial hemorrhage, extra-axial fluid collection, or acute infarct. Parenchymal volume is normal.  The  ventricles are stable in size. There is no mass lesion.  There is no midline shift. Vascular: No hyperdense vessel or unexpected calcification. Skull: Normal. Negative for fracture or focal lesion. Sinuses/Orbits: The imaged paranasal sinuses are clear. The globes and orbits are unremarkable. Other: None. IMPRESSION: No acute intracranial pathology. Electronically Signed   By: Valetta Mole M.D.   On: 09/18/2021 13:47   CT ABDOMEN PELVIS W CONTRAST  Result Date: 09/14/2021 CLINICAL DATA:  Altered mental status, acute abdominal pain. EXAM: CT ABDOMEN AND PELVIS WITH CONTRAST TECHNIQUE: Multidetector CT imaging of the abdomen and pelvis was performed using the standard protocol following bolus administration of intravenous contrast. CONTRAST:  13m OMNIPAQUE IOHEXOL 350 MG/ML SOLN COMPARISON:  PET 06/23/2021 and CT abdomen pelvis 04/20/2021. FINDINGS: Lower chest: Image quality in the lung bases is degraded by respiratory motion, limiting diagnostic quality. Heart size normal. No pericardial or pleural effusion. Distal esophagus is unremarkable. Hepatobiliary: Subcentimeter low-attenuation lesion in the right hepatic lobe is too small to characterize. Liver and gallbladder are otherwise unremarkable. No biliary ductal dilatation. Pancreas: Negative. Spleen: Negative. Adrenals/Urinary Tract: Adrenal glands are unremarkable. Low-attenuation lesion in the right kidney measures 1.4 cm, too small to characterize but likely a cyst, possibly minimally complex with a peripheral calcification. Kidneys are otherwise unremarkable. Ureters are decompressed. Bilateral hip arthroplasties create streak artifact, obscuring the majority of the bladder. Stomach/Bowel: Stomach, small bowel and colon are unremarkable. Appendix is not definitely visualized. Vascular/Lymphatic: Atherosclerotic calcification of the aorta. No pathologically enlarged lymph nodes. Reproductive: Uterus is visualized.  No adnexal mass. Other: No free fluid.   Mesenteries and peritoneum are unremarkable. Musculoskeletal: Bilateral hip arthroplasties. Degenerative changes in the spine. IMPRESSION: 1. No acute findings to explain the patient's abdominal pain. 2.  Aortic atherosclerosis (ICD10-I70.0). Electronically Signed   By: MLorin PicketM.D.   On: 09/06/2021 14:06   DG Chest Port 1 View  Result Date: 09/24/2021 CLINICAL DATA:  Altered mental status in a patient with history of pulmonary neoplasm. EXAM: PORTABLE CHEST 1 VIEW COMPARISON:  Comparison made with prior imaging studies from August of 2022. FINDINGS: EKG leads project over the chest. Trachea is midline. Cardiomediastinal contours and hilar structures are stable with fullness of the LEFT hilum and increased density about the LEFT upper chest compatible with known pulmonary neoplasm. No sign of lobar consolidation or visible pneumothorax. On limited assessment there is no acute skeletal process. Rib destruction of LEFT anterior ribs not as well demonstrated as on prior CT imaging. IMPRESSION: Stable findings of neoplasm in the LEFT chest. No acute cardiopulmonary disease. Electronically Signed   By: GZetta BillsM.D.   On: 09/21/2021 11:43    Procedures .Critical Care Performed by: FJacqlyn Larsen PA-C Authorized by: FJacqlyn Larsen PA-C   Critical care provider statement:    Critical care time (minutes):  60   Critical care time was exclusive of:  Separately billable procedures and treating other patients   Critical care was necessary to treat or prevent imminent or life-threatening deterioration of the following conditions:  Sepsis (Altered mental status)   Critical care was time spent personally by me on the following activities:  Blood draw for specimens, development of treatment plan with patient or surrogate, discussions with consultants, evaluation  of patient's response to treatment, examination of patient, obtaining history from patient or surrogate, ordering and performing treatments  and interventions, ordering and review of laboratory studies, ordering and review of radiographic studies, pulse oximetry, re-evaluation of patient's condition and review of old charts   I assumed direction of critical care for this patient from another provider in my specialty: no     Care discussed with: admitting provider     Medications Ordered in ED Medications  lactated ringers infusion ( Intravenous New Bag/Given 08/30/2021 1457)  acetaminophen (TYLENOL) tablet 1,000 mg (1,000 mg Oral Not Given 09/22/2021 1304)  lactated ringers bolus 1,000 mL (0 mLs Intravenous Stopped 09/13/2021 1505)    And  lactated ringers bolus 1,000 mL (0 mLs Intravenous Stopped 09/06/2021 1354)    And  lactated ringers bolus 250 mL (0 mLs Intravenous Stopped 09/17/2021 1539)  ceFEPIme (MAXIPIME) 2 g in sodium chloride 0.9 % 100 mL IVPB (0 g Intravenous Stopped 09/20/2021 1216)  metroNIDAZOLE (FLAGYL) IVPB 500 mg (0 mg Intravenous Stopped 09/25/2021 1246)  vancomycin (VANCOREADY) IVPB 1500 mg/300 mL (0 mg Intravenous Stopped 09/05/2021 1539)  iohexol (OMNIPAQUE) 350 MG/ML injection 80 mL (80 mLs Intravenous Contrast Given 08/28/2021 1320)  LORazepam (ATIVAN) injection 0.5 mg (0.5 mg Intravenous Given 09/06/2021 1255)  LORazepam (ATIVAN) injection 0.5 mg (0.5 mg Intravenous Given 09/03/2021 1456)    ED Course  I have reviewed the triage vital signs and the nursing notes.  Pertinent labs & imaging results that were available during my care of the patient were reviewed by me and considered in my medical decision making (see chart for details).    MDM Rules/Calculators/A&P                           Patient arrives with altered mental status, she is alert and able to follow some commands but will not verbally respond to ED staff or her family.  She is sitting up and appears somewhat tremulous.  Has not had any loss of consciousness or witnessed seizure-like activity.  On arrival she was noted to have an axillary temp of 100.2 and  was very tachycardic, concern for underlying infection given patient's immunocompromise state, currently undergoing chemotherapy and radiation.  Patient can also have metabolic derangement.  Sepsis protocol initiated with lab work, blood cultures, IV fluids and antibiotics.  Patient also has some lower abdominal tenderness we will get CT of the abdomen and pelvis as well as CT of the head given altered mental status.  I have independently ordered, reviewed and interpreted all labs and imaging: CBC: Leukopenia with white blood cell count of 2.0, but ANC of 1.5, hemoglobin is at baseline. CMP with CO2 of 38 it appears that patient has some chronic CO2 retention, no other significant electrolyte derangements, normal renal and liver function Lactic acid: Not elevated at 1.7 Coags: WNL Ammonia: WNL COVID/flu: Negative  Urinalysis obtained via straight cath is pending Urine culture and blood cultures pending.  Chest x-ray: Stable findings of neoplasm in the left chest with no acute cardiopulmonary disease  CT of the head without acute abnormality and CT of the abdomen and pelvis with no acute abnormalities.  Patient remains tachycardic, the rest of her vitals remained stable on home O2.  Urinalysis is pending suspect this may be a source of potential infection.  Regardless patient will need admission for altered mental status.  She has been anxious and somewhat agitated with interventions from staff and has required  some Ativan which has improved her agitation.  Hospitalist service consulted for admission, case discussed with Dr. Marylyn Ishihara who will see and admit the patient.  Final Clinical Impression(s) / ED Diagnoses Final diagnoses:  Altered mental status, unspecified altered mental status type    Rx / DC Orders ED Discharge Orders     None        Janet Berlin 09/18/2021 [place], [place], DO 09/12/21 [phone]

## 2021-09-11 NOTE — H&P (Signed)
History and Physical    Patricia Sampson EGB:[id number] DOB: Feb 11, 1956 DOA: 09/16/2021  PCP: Greig Right, MD  Patient coming from: Home  Chief Complaint: Altered mental status  HPI: Patricia Sampson is a 65 y.o. female with medical history significant of left lung adenocarcinoma on chemo/radiation, chronic hypoxic respiratory failure on 4 - 6L home O2, HTN, HLD. Presenting with altered mental status. History is from chart review and family at bedside. She was in her normal state of health when she went to sleep last night. Her son tried to wake her this morning and he has having difficulty. When he finally was able to arouse her, she "did not seem to be there." He became concerned and called for EMS.  ED Course: Was found to be tachypneic and tachycardic. CXR and CTH were negative. She was pancytopenic. She was started on broad spec abx for sepsis protocol.TRH was called for admission.   Review of Systems:  Unable to obtain d/t mentation.  PMHx Past Medical History:  Diagnosis Date   Angioedema    COPD (chronic obstructive pulmonary disease) (HCC)    FHx: migraine headaches    Hyperlipidemia    Hypertension    Seasonal allergies     PSHx History reviewed. No pertinent surgical history.  SocHx  reports that she quit smoking about 20 years ago. Her smoking use included cigarettes. She has a 30.00 pack-year smoking history. She has never used smokeless tobacco. She reports that she does not drink alcohol and does not use drugs.  Allergies  Allergen Reactions   Dyazide [Hydrochlorothiazide W-Triamterene]     hives   Zestoretic [Lisinopril-Hydrochlorothiazide]     Swelling     FamHx Family History  Problem Relation Age of Onset   Diabetes Mellitus II Father    Coronary artery disease Mother    Hypertension Mother    COPD Mother     Prior to Admission medications   Medication Sig Start Date End Date Taking? Authorizing Provider  acetaminophen (TYLENOL) 650 MG CR  tablet Take 650-1,300 mg by mouth every 8 (eight) hours as needed for pain.   Yes [provider]  furosemide (LASIX) 20 MG tablet Take 20 mg by mouth daily. 05/18/21  Yes [provider]  gabapentin (NEURONTIN) 100 MG capsule Take 100 mg by mouth at bedtime. 07/17/21  Yes [provider]  Magnesium 250 MG TABS Take 250 mg by mouth daily.   Yes [provider]  potassium chloride (MICRO-K) 10 MEQ CR capsule Take 10 mEq by mouth 2 (two) times daily.   Yes [provider]  prochlorperazine (COMPAZINE) 10 MG tablet Take 1 tablet (10 mg total) by mouth every 6 (six) hours as needed for nausea or vomiting. 07/19/21  Yes Curt Bears, MD  rosuvastatin (CRESTOR) 10 MG tablet Take 10 mg by mouth daily.   Yes [provider]  sucralfate (CARAFATE) 1 g tablet Take 1 tablet (1 g total) by mouth 4 (four) times daily. Dissolve each tablet in 15 cc water before use. 09/01/21  Yes Kyung Rudd, MD  albuterol (VENTOLIN HFA) 108 (90 Base) MCG/ACT inhaler Inhale 2 puffs into the lungs every 4 (four) hours as needed for wheezing or shortness of breath. Patient not taking: No sig reported 01/07/20   Collene Gobble, MD  amitriptyline (ELAVIL) 10 MG tablet Take 10 mg by mouth daily.    [provider]  nystatin (MYCOSTATIN) 100000 UNIT/ML suspension Take 5 mLs (500,000 Units total) by mouth 4 (four)  times daily. Patient not taking: No sig reported 07/25/21   Hayden Pedro, PA-C  oxyCODONE-acetaminophen (PERCOCET/ROXICET) 5-325 MG tablet Take 1 tablet by mouth every 6 (six) hours as needed for severe pain. Patient not taking: No sig reported 08/04/21   Heilingoetter, Cassandra L, PA-C  SYMBICORT 160-4.5 MCG/ACT inhaler TAKE 2 PUFFS BY MOUTH TWICE A DAY Patient not taking: No sig reported 01/24/21   Collene Gobble, MD  Tiotropium Bromide Monohydrate (SPIRIVA RESPIMAT) 2.5 MCG/ACT AERS Inhale 2 puffs into the lungs daily. Patient not taking: No sig reported  04/15/20   Collene Gobble, MD    Physical Exam: Vitals:   09/13/2021 1230 08/29/2021 1245 08/30/2021 1400 09/13/2021 1500  BP: 127/78  121/71 138/70  Pulse: (!) 133 (!) 127 73 (!) 124  Resp: (!) 34 16 (!) 24 (!) 22  Temp:      TempSrc:      SpO2: 99% 100% (!) 87% 100%    General: 65 y.o. ill appearing female resting in bed in NAD Eyes: PERRL, normal sclera ENMT: Nares patent w/o discharge, orophaynx clear, dentition normal, ears w/o discharge/lesions/ulcers Neck: Supple, trachea midline Cardiovascular: tachy, +S1, S2, no m/g/r, equal pulses throughout Respiratory: tachypnea, decreased at bases but shallow breathing, increased WOB on 3L [place] GI: BS+, NDNT, no masses noted, no organomegaly noted MSK: No c/c; trace b/l edema Neuro: stirs to noxious stim; will say "huh?" But not following commands  Labs on Admission: I have personally reviewed following labs and imaging studies  CBC: Recent Labs  Lab 09/16/2021 1155 08/27/2021 1201  WBC 2.0*  --   NEUTROABS 1.5*  --   HGB 10.0* 10.9*  HCT 33.6* 32.0*  MCV 84.2  --   PLT 139*  --    Basic Metabolic Panel: Recent Labs  Lab 09/20/2021 1155 09/13/2021 1201  NA 135 135  K 3.5 3.5  CL 86* 86*  CO2 38*  --   GLUCOSE 107* 106*  BUN 22 20  CREATININE 0.95 0.80  CALCIUM 8.6*  --    GFR: Estimated Creatinine Clearance: 77.8 mL/min (by C-G formula based on SCr of 0.8 mg/dL). Liver Function Tests: Recent Labs  Lab 08/28/2021 1155  AST 17  ALT 10  ALKPHOS 73  BILITOT 0.6  PROT 7.1  ALBUMIN 2.7*   No results for input(s): LIPASE, AMYLASE in the last 168 hours. Recent Labs  Lab 08/26/2021 1155  AMMONIA 18   Coagulation Profile: Recent Labs  Lab 09/25/2021 1155  INR 1.1   Cardiac Enzymes: No results for input(s): CKTOTAL, CKMB, CKMBINDEX, TROPONINI in the last 168 hours. BNP (last 3 results) No results for input(s): PROBNP in the last 8760 hours. HbA1C: No results for input(s): HGBA1C in the last 72 hours. CBG: No results for  input(s): GLUCAP in the last 168 hours. Lipid Profile: No results for input(s): CHOL, HDL, LDLCALC, TRIG, CHOLHDL, LDLDIRECT in the last 72 hours. Thyroid Function Tests: No results for input(s): TSH, T4TOTAL, FREET4, T3FREE, THYROIDAB in the last 72 hours. Anemia Panel: No results for input(s): VITAMINB12, FOLATE, FERRITIN, TIBC, IRON, RETICCTPCT in the last 72 hours. Urine analysis: No results found for: COLORURINE, APPEARANCEUR, LABSPEC, PHURINE, GLUCOSEU, HGBUR, BILIRUBINUR, KETONESUR, PROTEINUR, UROBILINOGEN, NITRITE, LEUKOCYTESUR  Radiological Exams on Admission: CT Head Wo Contrast  Result Date: 09/24/2021 CLINICAL DATA:  Altered mental status EXAM: CT HEAD WITHOUT CONTRAST TECHNIQUE: Contiguous axial images were obtained from the base of the skull through the vertex without intravenous contrast. COMPARISON:  Brain MRI 07/26/2021 FINDINGS:  Brain: There is no acute intracranial hemorrhage, extra-axial fluid collection, or acute infarct. Parenchymal volume is normal.  The ventricles are stable in size. There is no mass lesion.  There is no midline shift. Vascular: No hyperdense vessel or unexpected calcification. Skull: Normal. Negative for fracture or focal lesion. Sinuses/Orbits: The imaged paranasal sinuses are clear. The globes and orbits are unremarkable. Other: None. IMPRESSION: No acute intracranial pathology. Electronically Signed   By: Valetta Mole M.D.   On: 09/24/2021 13:47   CT ABDOMEN PELVIS W CONTRAST  Result Date: 09/10/2021 CLINICAL DATA:  Altered mental status, acute abdominal pain. EXAM: CT ABDOMEN AND PELVIS WITH CONTRAST TECHNIQUE: Multidetector CT imaging of the abdomen and pelvis was performed using the standard protocol following bolus administration of intravenous contrast. CONTRAST:  67m OMNIPAQUE IOHEXOL 350 MG/ML SOLN COMPARISON:  PET 06/23/2021 and CT abdomen pelvis 04/20/2021. FINDINGS: Lower chest: Image quality in the lung bases is degraded by respiratory  motion, limiting diagnostic quality. Heart size normal. No pericardial or pleural effusion. Distal esophagus is unremarkable. Hepatobiliary: Subcentimeter low-attenuation lesion in the right hepatic lobe is too small to characterize. Liver and gallbladder are otherwise unremarkable. No biliary ductal dilatation. Pancreas: Negative. Spleen: Negative. Adrenals/Urinary Tract: Adrenal glands are unremarkable. Low-attenuation lesion in the right kidney measures 1.4 cm, too small to characterize but likely a cyst, possibly minimally complex with a peripheral calcification. Kidneys are otherwise unremarkable. Ureters are decompressed. Bilateral hip arthroplasties create streak artifact, obscuring the majority of the bladder. Stomach/Bowel: Stomach, small bowel and colon are unremarkable. Appendix is not definitely visualized. Vascular/Lymphatic: Atherosclerotic calcification of the aorta. No pathologically enlarged lymph nodes. Reproductive: Uterus is visualized.  No adnexal mass. Other: No free fluid.  Mesenteries and peritoneum are unremarkable. Musculoskeletal: Bilateral hip arthroplasties. Degenerative changes in the spine. IMPRESSION: 1. No acute findings to explain the patient's abdominal pain. 2.  Aortic atherosclerosis (ICD10-I70.0). Electronically Signed   By: MLorin PicketM.D.   On: 09/25/2021 14:06   DG Chest Port 1 View  Result Date: 09/10/2021 CLINICAL DATA:  Altered mental status in a patient with history of pulmonary neoplasm. EXAM: PORTABLE CHEST 1 VIEW COMPARISON:  Comparison made with prior imaging studies from August of 2022. FINDINGS: EKG leads project over the chest. Trachea is midline. Cardiomediastinal contours and hilar structures are stable with fullness of the LEFT hilum and increased density about the LEFT upper chest compatible with known pulmonary neoplasm. No sign of lobar consolidation or visible pneumothorax. On limited assessment there is no acute skeletal process. Rib destruction  of LEFT anterior ribs not as well demonstrated as on prior CT imaging. IMPRESSION: Stable findings of neoplasm in the LEFT chest. No acute cardiopulmonary disease. Electronically Signed   By: GZetta BillsM.D.   On: 09/23/2021 11:43    EKG: Independently reviewed. Sinus tach  Assessment/Plan Sepsis w/ unknown source Acute metabolic encephalopathy     - admit to inpt, SDU     - continue broad spec abx for now     - check CTA chest     - continue fluids     - check ABG, ammonia levels, EEG  Adenocarcinoma of the left lung    - follows w/ Dr. MJulien Nordmannfor chemo and Dr. MLisbeth Renshawfor radiation; Dr. MJulien Nordmannhas been notified of her admission  Pancytopenia     - no evidence of bleed     - ANC is 1500     - SCDs for DVT prophylaxis  COPD Chronic respiratory failure w/  hypoxia     - continue O2 support  DVT prophylaxis: SCDs  Code Status: FULL  Family Communication: w/ husband at bedside  Consults called: Onco, PCCM   Status is: Inpatient  Remains inpatient appropriate because: severity of illness  Time spent:   [facility]  If 7PM-7AM, please contact night-coverage [email or web address]  09/04/2021, 3:41 PM

## 2021-09-11 NOTE — Progress Notes (Addendum)
OVERNIGHT PROGRESS REPORT  Follow up on MRI  - Results below in part :  "No sign of acute or subacute Insult":  ========================= "FINDINGS: Brain: The study suffers from considerable motion degradation due to poor cooperation. Diffusion imaging does not show any acute or subacute infarction. There are minimal small vessel ischemic changes of the cerebral hemispheric white matter. No sign of large vessel infarction. No mass lesion, hemorrhage, hydrocephalus or extra-axial collection.   Vascular: Major vessels at the base of the brain show flow.   Skull and upper cervical spine: Negative   Sinuses/Orbits: Clear/normal   Other: None   IMPRESSION: Significantly motion degraded exam. No sign of acute or subacute insult. Mild chronic small-vessel change of the cerebral hemispheric white matter."   =========================== Please see MRI "results page" for report in entirety.      Gershon Cull MSNA MSN ACNPC-AG Acute Care Nurse Practitioner [place]

## 2021-09-11 NOTE — Progress Notes (Signed)
Elink following code sepsis

## 2021-09-11 NOTE — Consult Note (Signed)
NAME:  MARIO VOONG, MRN:  [id number], DOB:  1956-11-13, LOS: 0 ADMISSION DATE:  09/19/2021, CONSULTATION DATE: 08/31/2021 REFERRING MD: Dr. Marylyn Ishihara, CHIEF COMPLAINT: Encephalopathy  History of Present Illness:  65 year old woman with very severe COPD, associated chronic hypoxemic respiratory failure (4-8 L/min), hypertension, hyperlipidemia.  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 She was noted to have altered mental status on the morning of 10/17, appeared to be normal when she went to bed the night before.  In the emergency department she was tachycardic, tachypneic with pancytopenia.  Last chemotherapy infusion appears to have been 09/05/2021.  Acute metabolic encephalopathy.  She was treated empirically for possible sepsis, source unknown. Home med list with Percocet, Neurontin.  Her husband notes that he does not believe she is been taking any of her medicines for the last 2 to 3 days.   Pertinent  Medical History   Past Medical History:  Diagnosis Date   Angioedema    COPD (chronic obstructive pulmonary disease) (HCC)    FHx: migraine headaches    Hyperlipidemia    Hypertension    Seasonal allergies     Significant Hospital Events: Including procedures, antibiotic start and stop dates in addition to other pertinent events   CT abdomen pelvis 10/17 >> no acute findings Head CT 10/17 >> normal CT-PA chest 10/17 >> no new infiltrates or significant change in the left upper lobe mass  Interim History / Subjective:  I/O +2.4L UDS positive for opiates WBC 2.0, ANC 1500   Objective   Blood pressure 135/71, pulse (!) 138, temperature 99 F (37.2 C), temperature source Oral, resp. rate (!) 30, SpO2 100 %.        Intake/Output Summary (Last 24 hours) at 08/26/2021 1812 Last data filed at 09/10/2021 1742 Gross per 24 hour  Intake 2419.83 ml  Output --  Net 2419.83 ml   There  were no vitals filed for this visit.  Examination: General: Acute and chronically ill-appearing woman, thin, sitting up in bed HENT: Oropharynx clear, pupils equal Lungs: Very distant, decreased to both bases, no wheezing Cardiovascular: Regular, no murmur Abdomen: Nondistended, positive bowel sounds Extremities: No edema Neuro: Eyes are open, appears to track and turn to voice.  She has nodded to some questions from her husband.  Did not nod to my questions, did not follow my commands.  She is aphasic  Resolved Hospital Problem list     Assessment & Plan:  Acute encephalopathy, initially ascribed to toxic metabolic cause which is possible although no clear source for sepsis has been identified, normal renal function.  She has baseline hypoxemia and may have experienced a hypoxemic insult.  Her SPO2 is not picking up well currently, on 1.00 NRB for now.  No new medicines and no medicines that she would clearly withdraw from, would be unusual for Neurontin.  Apparently she got ativan x2 earlier today.  -Given her aphasia most concerned that she may have experienced CVA.  I think she needs MRI brain, neurology consultation ASAP as code stroke.  EEG may be helpful as well. I called Dr. Marylyn Ishihara and discussed with him. -keep her adequately oxygenated -hold any sedating meds.   Acute on chronic hypoxemic respiratory failure.  She has severe COPD, is on 6-8 L/min at baseline. -Wean her FiO2 as able.  Difficult as her SPO2 is not picking up very well. -Continue her maintenance bronchodilators.  No evidence of an acute  exacerbation COPD -I did broach the subject of CODE STATUS with the patient's husband at bedside.  They have not discussed goals of care in the past.  I told him my concern that she would not survive ventilation or CPR.  He understood and is going to process this.  He needs to also speak with the patient's son.  Hopefully we will be able to have a conversation with the patient herself if  her mental status clears.  Adenocarcinoma of the lung, on chemoradiation.  Pancytopenia on presentation without any clear evidence for infection or evidence for bleeding. -following CBC -appreciate Oncology input   Best Practice (right click and "Reselect all SmartList Selections" daily)   Diet/type: Regular consistency (see orders) DVT prophylaxis: other GI prophylaxis: N/A Lines: N/A Foley:  N/A Code Status:  full code Last date of multidisciplinary goals of care discussion [10/17. Discussed GOC briefly with pt's husband 10/17 -- need further discussions and would like to include pt, her son. She is full code at this time.]  Labs   CBC: Recent Labs  Lab 09/22/2021 1155 09/01/2021 1201  WBC 2.0*  --   NEUTROABS 1.5*  --   HGB 10.0* 10.9*  HCT 33.6* 32.0*  MCV 84.2  --   PLT 139*  --     Basic Metabolic Panel: Recent Labs  Lab 09/21/2021 1155 09/25/2021 1201  NA 135 135  K 3.5 3.5  CL 86* 86*  CO2 38*  --   GLUCOSE 107* 106*  BUN 22 20  CREATININE 0.95 0.80  CALCIUM 8.6*  --    GFR: Estimated Creatinine Clearance: 77.8 mL/min (by C-G formula based on SCr of 0.8 mg/dL). Recent Labs  Lab 09/22/2021 1155  WBC 2.0*  LATICACIDVEN 1.7    Liver Function Tests: Recent Labs  Lab 08/29/2021 1155  AST 17  ALT 10  ALKPHOS 73  BILITOT 0.6  PROT 7.1  ALBUMIN 2.7*   No results for input(s): LIPASE, AMYLASE in the last 168 hours. Recent Labs  Lab 09/16/2021 1155  AMMONIA 18    ABG    Component Value Date/Time   PHART 7.444 09/01/2021 1745   PCO2ART 54.2 (H) 09/25/2021 1745   PO2ART 35.2 (LL) 09/05/2021 1745   HCO3 36.6 (H) 09/16/2021 1745   TCO2 37 (H) 09/01/2021 1201   O2SAT 66.2 09/17/2021 1745     Coagulation Profile: Recent Labs  Lab 09/12/2021 1155  INR 1.1    Cardiac Enzymes: No results for input(s): CKTOTAL, CKMB, CKMBINDEX, TROPONINI in the last 168 hours.  HbA1C: No results found for: HGBA1C  CBG: No results for input(s): GLUCAP in the last  168 hours.  Review of Systems:   As per HPi  Past Medical History:  She,  has a past medical history of Angioedema, COPD (chronic obstructive pulmonary disease) ([place]), FHx: migraine headaches, Hyperlipidemia, Hypertension, and Seasonal allergies.   Surgical History:  History reviewed. No pertinent surgical history.   Social History:   reports that she quit smoking about 20 years ago. Her smoking use included cigarettes. She has a 30.00 pack-year smoking history. She has never used smokeless tobacco. She reports that she does not drink alcohol and does not use drugs.   Family History:  Her family history includes COPD in her mother; Coronary artery disease in her mother; Diabetes Mellitus II in her father; Hypertension in her mother.   Allergies Allergies  Allergen Reactions   Dyazide [Hydrochlorothiazide W-Triamterene]     hives   Zestoretic [Lisinopril-Hydrochlorothiazide]  Swelling      Home Medications  Prior to Admission medications   Medication Sig Start Date End Date Taking? Authorizing Provider  acetaminophen (TYLENOL) 650 MG CR tablet Take 650-1,300 mg by mouth every 8 (eight) hours as needed for pain.   Yes [provider]  furosemide (LASIX) 20 MG tablet Take 20 mg by mouth daily. 05/18/21  Yes [provider]  gabapentin (NEURONTIN) 100 MG capsule Take 100 mg by mouth at bedtime. 07/17/21  Yes [provider]  Magnesium 250 MG TABS Take 250 mg by mouth daily.   Yes [provider]  potassium chloride (MICRO-K) 10 MEQ CR capsule Take 10 mEq by mouth 2 (two) times daily.   Yes [provider]  prochlorperazine (COMPAZINE) 10 MG tablet Take 1 tablet (10 mg total) by mouth every 6 (six) hours as needed for nausea or vomiting. 07/19/21  Yes Curt Bears, MD  rosuvastatin (CRESTOR) 10 MG tablet Take 10 mg by mouth daily.   Yes [provider]  sucralfate (CARAFATE) 1 g tablet Take 1 tablet (1 g total) by mouth 4  (four) times daily. Dissolve each tablet in 15 cc water before use. 09/01/21  Yes Kyung Rudd, MD  albuterol (VENTOLIN HFA) 108 (90 Base) MCG/ACT inhaler Inhale 2 puffs into the lungs every 4 (four) hours as needed for wheezing or shortness of breath. Patient not taking: No sig reported 01/07/20   Collene Gobble, MD  amitriptyline (ELAVIL) 10 MG tablet Take 10 mg by mouth daily.    [provider]  nystatin (MYCOSTATIN) 100000 UNIT/ML suspension Take 5 mLs (500,000 Units total) by mouth 4 (four) times daily. Patient not taking: No sig reported 07/25/21   Hayden Pedro, PA-C  oxyCODONE-acetaminophen (PERCOCET/ROXICET) 5-325 MG tablet Take 1 tablet by mouth every 6 (six) hours as needed for severe pain. Patient not taking: No sig reported 08/04/21   Heilingoetter, Cassandra L, PA-C  SYMBICORT 160-4.5 MCG/ACT inhaler TAKE 2 PUFFS BY MOUTH TWICE A DAY Patient not taking: No sig reported 01/24/21   Collene Gobble, MD  Tiotropium Bromide Monohydrate (SPIRIVA RESPIMAT) 2.5 MCG/ACT AERS Inhale 2 puffs into the lungs daily. Patient not taking: No sig reported 04/15/20   Collene Gobble, MD     Critical care time: 18 minutes     Baltazar Apo, MD, PhD 09/16/2021, 7:07 PM [facility] [phone] or if no answer before 7:00PM call [phone] For any issues after 7:00PM please call eLink [phone]

## 2021-09-11 NOTE — ED Triage Notes (Signed)
Pt brought from home by family. Son went to get pt up around 0730 and dressed for cancer ctr appt but pt did not respond. Pt did not recognize son and wanted to lay back down. Son went back at 0900 and pt looked 'spaced'. Again did not recognize son. Normally A&Ox4. Family reports 6L at baseline, increased with activity. Not responding to orientation questions.

## 2021-09-12 ENCOUNTER — Inpatient Hospital Stay (HOSPITAL_COMMUNITY)
Admit: 2021-09-12 | Discharge: 2021-09-12 | Disposition: A | Discharge status: Home / Self Care | Payer: 59 | Attending: Internal Medicine | Admitting: Internal Medicine

## 2021-09-12 ENCOUNTER — Ambulatory Visit: Payer: 59

## 2021-09-12 DIAGNOSIS — R4182 Altered mental status, unspecified: Secondary | ICD-10-CM | POA: Diagnosis not present

## 2021-09-12 DIAGNOSIS — J9611 Chronic respiratory failure with hypoxia: Secondary | ICD-10-CM

## 2021-09-12 DIAGNOSIS — J9612 Chronic respiratory failure with hypercapnia: Secondary | ICD-10-CM

## 2021-09-12 DIAGNOSIS — R6510 Systemic inflammatory response syndrome (SIRS) of non-infectious origin without acute organ dysfunction: Secondary | ICD-10-CM

## 2021-09-12 DIAGNOSIS — J9621 Acute and chronic respiratory failure with hypoxia: Principal | ICD-10-CM

## 2021-09-12 DIAGNOSIS — G9340 Encephalopathy, unspecified: Secondary | ICD-10-CM | POA: Diagnosis not present

## 2021-09-12 LAB — CBC
HCT: 33.8 % — ABNORMAL LOW (ref 36.0–46.0)
Hemoglobin: 10 g/dL — ABNORMAL LOW (ref 12.0–15.0)
MCH: 25.1 pg — ABNORMAL LOW (ref 26.0–34.0)
MCHC: 29.6 g/dL — ABNORMAL LOW (ref 30.0–36.0)
MCV: 84.9 fL (ref 80.0–100.0)
Platelets: 111 10*3/uL — ABNORMAL LOW (ref 150–400)
RBC: 3.98 MIL/uL (ref 3.87–5.11)
RDW: 18.6 % — ABNORMAL HIGH (ref 11.5–15.5)
WBC: 2.4 10*3/uL — ABNORMAL LOW (ref 4.0–10.5)
nRBC: 0 % (ref 0.0–0.2)

## 2021-09-12 LAB — URINE CULTURE: Culture: NO GROWTH

## 2021-09-12 LAB — COMPREHENSIVE METABOLIC PANEL
ALT: 10 U/L (ref 0–44)
AST: 14 U/L — ABNORMAL LOW (ref 15–41)
Albumin: 2.4 g/dL — ABNORMAL LOW (ref 3.5–5.0)
Alkaline Phosphatase: 72 U/L (ref 38–126)
Anion gap: 12 (ref 5–15)
BUN: 15 mg/dL (ref 8–23)
CO2: 33 mmol/L — ABNORMAL HIGH (ref 22–32)
Calcium: 8.3 mg/dL — ABNORMAL LOW (ref 8.9–10.3)
Chloride: 92 mmol/L — ABNORMAL LOW (ref 98–111)
Creatinine, Ser: 0.63 mg/dL (ref 0.44–1.00)
GFR, Estimated: >60 mL/min (ref >60)
Glucose, Bld: 84 mg/dL (ref 70–99)
Potassium: 3.4 mmol/L — ABNORMAL LOW (ref 3.5–5.1)
Sodium: 137 mmol/L (ref 135–145)
Total Bilirubin: 0.6 mg/dL (ref 0.3–1.2)
Total Protein: 6.2 g/dL — ABNORMAL LOW (ref 6.5–8.1)

## 2021-09-12 LAB — VITAMIN B12: Vitamin B-12: 278 pg/mL (ref 180–914)

## 2021-09-12 LAB — HIV ANTIBODY (ROUTINE TESTING W REFLEX): HIV Screen 4th Generation wRfx: NONREACTIVE

## 2021-09-12 LAB — TSH: TSH: 2.466 u[IU]/mL (ref 0.350–4.500)

## 2021-09-12 LAB — PROCALCITONIN: Procalcitonin: 0.4 ng/mL

## 2021-09-12 LAB — PROTIME-INR
INR: 1.1 (ref 0.8–1.2)
Prothrombin Time: 14.6 seconds (ref 11.4–15.2)

## 2021-09-12 LAB — FOLATE: Folate: 4.4 ng/mL — ABNORMAL LOW (ref >5.9)

## 2021-09-12 LAB — CORTISOL-AM, BLOOD: Cortisol - AM: 18.8 ug/dL (ref 6.7–22.6)

## 2021-09-12 MED ORDER — SODIUM CHLORIDE 0.9 % IV SOLN
2.0000 g | INTRAVENOUS | Status: DC
  Administered 2021-09-12 – 2021-09-17 (×26): 2 g via INTRAVENOUS
  Filled 2021-09-12 (×3): qty 2
  Filled 2021-09-12 (×10): qty 2000
  Filled 2021-09-12: qty 2
  Filled 2021-09-12 (×3): qty 2000
  Filled 2021-09-12: qty 2
  Filled 2021-09-12 (×13): qty 2000

## 2021-09-12 MED ORDER — DEXMEDETOMIDINE HCL IN NACL 200 MCG/50ML IV SOLN
0.4000 ug/kg/h | INTRAVENOUS | Status: DC
  Administered 2021-09-12: 0.4 ug/kg/h via INTRAVENOUS
  Administered 2021-09-13: 0.2 ug/kg/h via INTRAVENOUS
  Filled 2021-09-12 (×2): qty 50

## 2021-09-12 MED ORDER — POTASSIUM CHLORIDE 10 MEQ/100ML IV SOLN
10.0000 meq | INTRAVENOUS | Status: AC
  Administered 2021-09-12 (×4): 10 meq via INTRAVENOUS
  Filled 2021-09-12 (×4): qty 100

## 2021-09-12 MED ORDER — MORPHINE SULFATE (PF) 2 MG/ML IV SOLN
1.0000 mg | INTRAVENOUS | Status: AC | PRN
  Administered 2021-09-12 (×2): 1 mg via INTRAVENOUS
  Filled 2021-09-12 (×2): qty 1

## 2021-09-12 MED ORDER — DEXTROSE 5 % IV SOLN
10.0000 mg/kg | Freq: Three times a day (TID) | INTRAVENOUS | Status: DC
  Administered 2021-09-12 – 2021-09-19 (×21): 745 mg via INTRAVENOUS
  Filled 2021-09-12 (×4): qty 14.9
  Filled 2021-09-12: qty 10
  Filled 2021-09-12: qty 14.9
  Filled 2021-09-12: qty 10
  Filled 2021-09-12 (×13): qty 14.9
  Filled 2021-09-12: qty 10
  Filled 2021-09-12 (×2): qty 14.9

## 2021-09-12 NOTE — Procedures (Signed)
Patient Name: Patricia Sampson  MRN: [id number]  Epilepsy Attending: Lora Havens  Referring Physician/Provider: Dr Cherylann Ratel Date: 09/12/2021 Duration: 26.41 mins  Patient history: 65 year old female with altered mental status.  EEG to evaluate for seizure.  Level of alertness: Awake  AEDs during EEG study: None  Technical aspects: This EEG study was done with scalp electrodes positioned according to the 10-20 International system of electrode placement. Electrical activity was acquired at a sampling rate of _0  and reviewed with a high frequency filter of _1  and a low frequency filter of _2 . EEG data were recorded continuously and digitally stored.   Description: No posterior dominant rhythm was seen. EEG showed continuous generalized sharply contoured 3 to 6 Hz theta-delta slowing, at times with triphasic morphology. Hyperventilation and photic stimulation were not performed.     ABNORMALITY - Continuous slow, generalized -Triphasic waves, generalized   IMPRESSION: This study is suggestive of moderate diffuse encephalopathy, nonspecific etiology but could be secondary to toxic-metabolic causes, cefepime toxicity. No seizures or definite epileptiform discharges were seen throughout the recording.  [name]

## 2021-09-12 NOTE — Progress Notes (Signed)
K+ 3.4 Replaced per protocol

## 2021-09-12 NOTE — Progress Notes (Signed)
[facility] Progress Note Patient Name: Patricia Sampson DOB: 1956/02/07 MRN: [id number]   Date of Service  09/12/2021  HPI/Events of Note  Pt restless and anxious to the point of causing tachypnea.  When she moves, her SpO2 drops.  eICU Interventions  Will start a precedex gtt due to concern that any opioid or benzodiazepine will lead to resp depression or paroxysmal agitation      Intervention Category Intermediate Interventions: Change in mental status - evaluation and management  Tilden Dome 09/12/2021, 7:51 PM

## 2021-09-12 NOTE — Progress Notes (Signed)
NAME:  Patricia Sampson, MRN:  [id number], DOB:  10-Mar-1956, LOS: 1 ADMISSION DATE:  09/05/2021, CONSULTATION DATE: 09/06/2021 REFERRING MD: Dr. Marylyn Ishihara, CHIEF COMPLAINT: Encephalopathy  History of Present Illness:  65 year old woman with very severe COPD, associated chronic hypoxemic respiratory failure (4-8 L/min), hypertension, hyperlipidemia.  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 She was noted to have altered mental status on the morning of 10/17, appeared to be normal when she went to bed the night before.  In the emergency department she was tachycardic, tachypneic with pancytopenia.  Last chemotherapy infusion appears to have been 09/05/2021.  Acute metabolic encephalopathy.  She was treated empirically for possible sepsis, source unknown. Home med list with Percocet, Neurontin.  Her husband notes that he does not believe she is been taking any of her medicines for the last 2 to 3 days.   Pertinent  Medical History   Past Medical History:  Diagnosis Date   Angioedema    COPD (chronic obstructive pulmonary disease) (HCC)    FHx: migraine headaches    Hyperlipidemia    Hypertension    Seasonal allergies     Significant Hospital Events: Including procedures, antibiotic start and stop dates in addition to other pertinent events   CT abdomen pelvis 10/17 >> no acute findings Head CT 10/17 >> normal CT-PA chest 10/17 >> no new infiltrates or significant change in the left upper lobe mass.  No PE MRI brain 10/17 >> Motion degradation compromise interpretation, no acute or subacute infarct, no mass lesion  Interim History / Subjective:  I/O +4.7L total Received potassium supplementation this morning Ammonia 31 WBC 2.0 > 0.4 AM cortisol 18.8   Objective   Blood pressure 120/67, pulse (!) 117, temperature 99.5 F (37.5 C), temperature source Axillary, resp. rate (!) 24, SpO2 100 %.         Intake/Output Summary (Last 24 hours) at 09/12/2021 0753 Last data filed at 09/12/2021 0734 Gross per 24 hour  Intake 4751.02 ml  Output --  Net 4751.02 ml   There were no vitals filed for this visit.  Examination: General: Sitting up in bed HENT: Pupils equal, oropharynx clear Lungs: Very distant without any wheezing or crackles Cardiovascular: Regular, distant, no murmur Abdomen: Positive bowel sounds, nondistended Extremities: No edema Neuro: Her eyes are open.  She did not turn to voice or track when I interacted with her.  She may have been directable to voice by her son, difficult to say.  She remains aphasic  Resolved Hospital Problem list     Assessment & Plan:  Acute encephalopathy, still considering a toxic metabolic cause, possibly infection although no clear source identified thus far.  No evidence for CVA on MRI brain 10/17.  Consider progressive debilitation encephalopathy due to her chronic diseases, superimposed hypoxemia, although this will be diagnosis of exclusion.  She has not taken her gabapentin and other medications for several days, question a component of withdrawal although unlikely. -Agree with empiric antibiotics for now although no clear source identified.  Can likely begin to narrow, probably to just cefepime alone -Maintain adequate oxygenation -Hold any sedating medications -Thiamine, folate, B12 ordered -Agree with consulting neurology -EEG ordered and pending  Acute on chronic hypoxemic respiratory failure.  She has severe COPD, is on 6-8 L/min at baseline. Severe COPD -Continue to wean her FiO2 as able.  Difficult as her SPO2 is not picking up very well. -Continue her maintenance bronchodilators.  No evidence of an acute exacerbation COPD.  Would like to avoid corticosteroids if possible, since these could contribute to delirium -I did broach the subject of CODE STATUS with the patient's husband at bedside on 10/18.  They have not discussed  goals of care in the past.  I told him my concern that she would not survive ventilation or CPR.  He understood and is going to process this.  Will need to have further conversations with him and also the patient's son.  Hopefully we can get to a point where the patient can clear her mental status and participate as well  Adenocarcinoma of the lung, on chemoradiation.   Pancytopenia on presentation without any clear evidence for infection or evidence for bleeding. -Following CBC -Appreciate oncology input   Best Practice (right click and "Reselect all SmartList Selections" daily)   Diet/type: Regular consistency (see orders) DVT prophylaxis: other GI prophylaxis: N/A Lines: N/A Foley:  N/A Code Status:  full code Last date of multidisciplinary goals of care discussion [10/17. Discussed GOC briefly with pt's husband 10/17 -- need further discussions and would like to include pt, her son. She is full code at this time.]  Labs   CBC: Recent Labs  Lab 09/18/2021 1155 09/15/2021 1201 09/12/21 0248  WBC 2.0*  --  2.4*  NEUTROABS 1.5*  --   --   HGB 10.0* 10.9* 10.0*  HCT 33.6* 32.0* 33.8*  MCV 84.2  --  84.9  PLT 139*  --  111*    Basic Metabolic Panel: Recent Labs  Lab 09/25/2021 1155 09/05/2021 1201 09/12/21 0248  NA 135 135 137  K 3.5 3.5 3.4*  CL 86* 86* 92*  CO2 38*  --  33*  GLUCOSE 107* 106* 84  BUN _0 CREATININE 0.95 0.80 0.63  CALCIUM 8.6*  --  8.3*   GFR: Estimated Creatinine Clearance: 77.8 mL/min (by C-G formula based on SCr of 0.63 mg/dL). Recent Labs  Lab 08/26/2021 1155 09/12/21 0248 09/12/21 0646  PROCALCITON  --   --  0.40  WBC 2.0* 2.4*  --   LATICACIDVEN 1.7  --   --     Liver Function Tests: Recent Labs  Lab 09/03/2021 1155 09/12/21 0248  AST 17 14*  ALT 10 10  ALKPHOS 73 72  BILITOT 0.6 0.6  PROT 7.1 6.2*  ALBUMIN 2.7* 2.4*   No results for input(s): LIPASE, AMYLASE in the last 168 hours. Recent Labs  Lab 09/14/2021 1155  09/02/2021 2302  AMMONIA 18 31    ABG    Component Value Date/Time   PHART 7.444 09/16/2021 1745   PCO2ART 54.2 (H) 09/13/2021 1745   PO2ART 35.2 (LL) 09/24/2021 1745   HCO3 36.6 (H) 09/09/2021 1745   TCO2 37 (H) 08/28/2021 1201   O2SAT 66.2 09/03/2021 1745     Coagulation Profile: Recent Labs  Lab 09/23/2021 1155 09/12/21 0248  INR 1.1 1.1    Cardiac Enzymes: No results for input(s): CKTOTAL, CKMB, CKMBINDEX, TROPONINI in the last 168 hours.  HbA1C: No results found for: HGBA1C  CBG: No results for input(s): GLUCAP in the last 168 hours.   Critical care time: NA      Baltazar Apo, MD, PhD 09/12/2021, 7:53 AM [facility] [phone] or if no answer before 7:00PM call [phone] For any issues after 7:00PM please call eLink [phone]

## 2021-09-12 NOTE — Consult Note (Addendum)
Neurology Consultation  Reason for Consult: Acute encephalopathy  Referring Physician: Dr. Lonny Prude  CC: Patient is unable to verbalize a chief complaint at this time 2/2 altered mental status with decreased verbalization  History is obtained from: Chart review, patient's son at bedside, unable to obtain from patient due to patient's mental status  HPI: Patricia Sampson is a 65 y.o. 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  Patient's son states that Sunday 10/16 patient was at her baseline picking at her outfit for her treatment on Monday. He states that at 3 AM on Monday morning he noticed that she was sitting straight up in bed and he asked if she was all right but she just waved him off as if to say she was fine but did not speak to him at this time. He states that she has had trouble sleeping for years and is awake and sitting up every night between 2-3 AM. The following morning, he went to wake her at around 07:00 to get dressed and she reached her hand out and said "help me up" but he states that she was groggy and still felt tired so she laid back down for a few hours. At 09:00 he attempted to get her out of bed again and noticed that once she was sitting up, she was staring at the wall and was not responding to him. He asked her if she was ready to get dressed twice without response and asked a third time more loudly to which she replied "yeah" but he noticed that her response seemed "off". When he tried to get her up, she hugged him and when she let go, he noticed her arms tremoring as if she was cold. He asked her if she knew who he was and she was unable to recognize  her son that she lives with which is markedly abnormal for her. He decided then that he needed to take her to the ED for further evaluation. He states that as he was driving her, her mental status progressively declined and she was unable to recall events and family as she usually would and by the time they made it to the hospital, she was hardly speaking other than "ouch, that hurts".   Initial work up in the ED revealed patient to meet SIRS criteria with an unclear etiology and was placed on broad spectrum antibiotics with pancytopenia in a patient on chemoradiation with a Tmax of F, tachycardia, and tachypnea.   At baseline, Ms. Deprey lives with family but is fairly independent. She only sometimes walks with the assistance of a walker and she is able to complete most of her ADLs independently but often accepts help from her family because it often takes her quite a bit of time to complete them herself. She usually is intact, oriented, and sharp with her mentation without notice of impaired cognition per family.   ROS: Unable to obtain due to altered mental status.   Past Medical History:  Diagnosis Date   Angioedema    COPD (chronic obstructive pulmonary disease) (HCC)    FHx: migraine headaches    Hyperlipidemia    Hypertension    Seasonal allergies    History  reviewed. No pertinent surgical history.  Family History  Problem Relation Age of Onset   Diabetes Mellitus II Father    Coronary artery disease Mother    Hypertension Mother    COPD Mother    Social History:   reports that she quit smoking about 20 years ago. Her smoking use included cigarettes. She has a 30.00 pack-year smoking history. She has never used smokeless tobacco. She reports that she does not drink alcohol and does not use drugs.  Medications  Current Facility-Administered Medications:    acetaminophen (TYLENOL) tablet 650 mg, 650 mg, Oral, Q6H PRN **OR** acetaminophen (TYLENOL) suppository 650 mg, 650 mg,  Rectal, Q6H PRN, Marylyn Ishihara, Tyrone A, DO   acetaminophen (TYLENOL) tablet 1,000 mg, 1,000 mg, Oral, Once, Ford, Kelsey N, PA-C   acetaminophen (TYLENOL) tablet 325 mg, 325 mg, Oral, Once **AND** oxyCODONE (Oxy IR/ROXICODONE) immediate release tablet 5 mg, 5 mg, Oral, Once, Curt Bears, MD   ceFEPIme (MAXIPIME) 2 g in sodium chloride 0.9 % 100 mL IVPB, 2 g, Intravenous, Q8H, Bell, Michelle T, RPH, Last Rate: 200 mL/hr at 09/12/21 1315, 2 g at 09/12/21 1315   chlorhexidine (PERIDEX) 0.12 % solution 15 mL, 15 mL, Mouth Rinse, BID, Kyle, Tyrone A, DO   Chlorhexidine Gluconate Cloth 2 % PADS 6 each, 6 each, Topical, Daily, Kyle, Tyrone A, DO, 6 each at 09/25/2021 2101   MEDLINE mouth rinse, 15 mL, Mouth Rinse, q12n4p, Kyle, Tyrone A, DO   metroNIDAZOLE (FLAGYL) IVPB 500 mg, 500 mg, Intravenous, Q12H, Kyle, Tyrone A, DO, Last Rate: 100 mL/hr at 09/12/21 1142, 500 mg at 09/12/21 1142   ondansetron (ZOFRAN) tablet 4 mg, 4 mg, Oral, Q6H PRN **OR** ondansetron (ZOFRAN) injection 4 mg, 4 mg, Intravenous, Q6H PRN, Marylyn Ishihara, Tyrone A, DO   vancomycin (VANCOREADY) IVPB 750 mg/150 mL, 750 mg, Intravenous, Q12H, Eudelia Bunch, RPH, Last Rate: 150 mL/hr at 09/12/21 1140, 750 mg at 09/12/21 1140  Exam: Current vital signs: BP (!) 144/79   Pulse (!) 119   Temp 98.1 F (36.7 C) (Axillary)   Resp 15   SpO2 100%  Vital signs in last 24 hours: Temp:  [97.6 F (36.4 C)-99.5 F (37.5 C)] 98.1 F (36.7 C) (10/18 1135) Pulse Rate:  [73-140] 119 (10/18 1300) Resp:  [15-36] 15 (10/18 1300) BP: (107-144)/(62-115) 144/79 (10/18 0800) SpO2:  [87 %-100 %] 100 % (10/18 1300)  GENERAL: Chronically ill-appearing female, laying comfortably in ICU bed with son at bedside. Psych: Unable to assess due to patient's altered mental status, patient becomes restless during assessment Head: Normocephalic and atraumatic, without obvious abnormality EENT: Normal conjunctivae, arcus senilis bilaterally, dry mucous membranes LUNGS:  Normal respiratory effort. Non-labored breathing on nasal cannula CV: Tachycardia on cardiac monitor, minimal bilateral lower extremity edema ABDOMEN: Soft, non-tender, non-distended Extremities: Warm, well perfused, without obvious deformity  NEURO:  Mental Status: Asleep initially, wakes to voice.  She does not answer any orientation questions. She is unable to provide a clear or coherent history of present illness. Patient does not have any comprehensible speech.  She does have morning and negative vocalizations with extremity manipulation. Patient does not follow commands.  She has not attempted to name objects or repeat phrases. No neglect is noted. Cranial Nerves:  II: Patient actively resists examiner eye-opening and thrashes with attempted visualization.  With spontaneous eye opening, pupils appear equal and round. III, IV, VI: She briefly fixates examiner and tracks to the right but does not track to the left visual field.  Eyes are disconjugate.  When his left eye is midline, right eye this slightly right of midline. V: She blinks to threat throughout. VII: Face appears symmetric resting and with movement VIII: Hearing is intact to voice IX, X: Unable to assess palate elevation XI: Head is grossly midline XII: Does not protrude tongue to command Motor: Formal strength assessment is limited due to limited patient participation exam. Patient moves all extremities spontaneously but does not initiate any antigravity movement throughout. Patient has a tremor with intention of her bilateral upper extremities. With manipulation of bilateral lower extremities, patient moans and has restless kicking of both of her legs. Tone is increased bilateral upper extremities.  Bulk is normal Sensation: Patient withdraws with negative vocalizations with a goal of bilateral feet.  Patient increases restless movements of bilateral upper extremities with manipulation. Coordination: Unable to assess due  to patient's condition DTRs: Unable to assess patellae due to patient's impaired relaxation, 2+ and symmetric biceps. Gait: Deferred for patient safety  Labs I have reviewed labs in epic and the results pertinent to this consultation are:  CBC    Component Value Date/Time   WBC 2.4 (L) 09/12/2021 0248   RBC 3.98 09/12/2021 0248   HGB 10.0 (L) 09/12/2021 0248   HGB 8.0 (L) 09/04/2021 1014   HCT 33.8 (L) 09/12/2021 0248   PLT 111 (L) 09/12/2021 0248   PLT 127 (L) 09/04/2021 1014   MCV 84.9 09/12/2021 0248   MCH 25.1 (L) 09/12/2021 0248   MCHC 29.6 (L) 09/12/2021 0248   RDW 18.6 (H) 09/12/2021 0248   LYMPHSABS 0.3 (L) 09/23/2021 1155   MONOABS 0.2 09/10/2021 1155   EOSABS 0.0 09/07/2021 1155   BASOSABS 0.0 09/14/2021 1155   CMP     Component Value Date/Time   NA 137 09/12/2021 0248   K 3.4 (L) 09/12/2021 0248   CL 92 (L) 09/12/2021 0248   CO2 33 (H) 09/12/2021 0248   GLUCOSE 84 09/12/2021 0248   BUN 15 09/12/2021 0248   CREATININE 0.63 09/12/2021 0248   CREATININE 0.88 09/04/2021 1014   CALCIUM 8.3 (L) 09/12/2021 0248   PROT 6.2 (L) 09/12/2021 0248   ALBUMIN 2.4 (L) 09/12/2021 0248   AST 14 (L) 09/12/2021 0248   AST 12 (L) 09/04/2021 1014   ALT 10 09/12/2021 0248   ALT 6 09/04/2021 1014   ALKPHOS 72 09/12/2021 0248   BILITOT 0.6 09/12/2021 0248   BILITOT 0.4 09/04/2021 1014   GFRNONAA >60 09/12/2021 0248   GFRNONAA >60 09/04/2021 1014   Lipid Panel  No results found for: CHOL, TRIG, HDL, CHOLHDL, VLDL, LDLCALC, LDLDIRECT  Lab Results  Component Value Date   TSH 2.466 09/12/2021   Lab Results  Component Value Date   VITAMINB12 278 09/12/2021   Results for orders placed or performed during the hospital encounter of 09/13/2021  Blood Culture (routine x 2)     Status: None (Preliminary result)   Collection Time: 09/05/2021 11:55 AM   Specimen: BLOOD RIGHT HAND  Result Value Ref Range Status   Specimen Description   Final    BLOOD RIGHT HAND Performed at [facility] [address], [place], [place] [address]    Special Requests   Final    BOTTLES DRAWN AEROBIC AND ANAEROBIC Blood Culture adequate volume Performed at [facility] [address] [place], [place] [address]    Culture   Final    NO GROWTH < 24 HOURS Performed at [facility], [address] [place], [place] [address]    Report Status PENDING  Incomplete  Blood Culture (routine x 2)     Status: None (Preliminary result)   Collection Time: 08/31/2021 11:55 AM   Specimen: BLOOD  Result Value Ref Range Status   Specimen Description   Final    BLOOD LEFT ANTECUBITAL Performed at [facility] [address] [place], [place] [address]    Special Requests   Final    BOTTLES DRAWN AEROBIC AND ANAEROBIC Blood Culture results may not be optimal due to an excessive volume of blood received in culture bottles Performed at [facility] [address], [place], [place] [address]    Culture   Final    NO GROWTH < 24 HOURS Performed at [facility] [address] [place], [place] [address]    Report Status PENDING  Incomplete  Resp Panel by RT-PCR (Flu A&B, Covid) Nasopharyngeal Swab     Status: None   Collection Time: 09/12/2021  1:50 PM   Specimen: Nasopharyngeal Swab; Nasopharyngeal(NP) swabs in vial transport medium  Result Value Ref Range Status   SARS Coronavirus 2 by RT PCR NEGATIVE NEGATIVE Final    Comment: (NOTE) SARS-CoV-2 target nucleic acids are NOT DETECTED.  The SARS-CoV-2 RNA is generally detectable in upper respiratory specimens during the acute phase of infection. The lowest concentration of SARS-CoV-2 viral copies this assay can detect is 138 copies/mL. A negative result does not preclude SARS-Cov-2 infection and should not be used as the sole basis for treatment or other patient management decisions. A negative result may occur with  improper specimen collection/handling,  submission of specimen other than nasopharyngeal swab, presence of viral mutation(s) within the areas targeted by this assay, and inadequate number of viral copies(<138 copies/mL). A negative result must be combined with clinical observations, patient history, and epidemiological information. The expected result is Negative.  Fact Sheet for Patients:  EntrepreneurPulse.com.au  Fact Sheet for Healthcare Providers:  IncredibleEmployment.be  This test is no t yet approved or cleared by the [place] FDA and  has been authorized for detection and/or diagnosis of SARS-CoV-2 by FDA under an Emergency Use Authorization (EUA). This EUA will remain  in effect (meaning this test can be used) for the duration of the COVID-19 declaration under Section 564(b)(1) of the Act, 21 U.S.C.section 360bbb-3(b)(1), unless the authorization is terminated  or revoked sooner.       Influenza A by PCR NEGATIVE NEGATIVE Final   Influenza B by PCR NEGATIVE NEGATIVE Final    Comment: (NOTE) The Xpert Xpress SARS-CoV-2/FLU/RSV plus assay is intended as an aid in the diagnosis of influenza from Nasopharyngeal swab specimens and should not be used as a sole basis for treatment. Nasal washings and aspirates are unacceptable for Xpert Xpress SARS-CoV-2/FLU/RSV testing.  Fact Sheet for Patients: EntrepreneurPulse.com.au  Fact Sheet for Healthcare Providers: IncredibleEmployment.be  This test is not yet approved or cleared by the [place] FDA and has been authorized for detection and/or diagnosis of SARS-CoV-2 by FDA under an Emergency Use Authorization (EUA). This EUA will remain in effect (meaning this test can be used) for the duration of the COVID-19 declaration under Section 564(b)(1) of the Act, 21 U.S.C. section 360bbb-3(b)(1), unless the authorization is terminated or revoked.  Performed at [facility], [place] [address], [place], [place] [address]   Urine Culture     Status: None   Collection Time: 09/12/2021  2:57 PM  Specimen: Urine, Random  Result Value Ref Range Status   Specimen Description   Final    URINE, RANDOM Performed at [facility] [address] [place], [place] [address]    Special Requests   Final    NONE Performed at [facility], [place] [address] [place], [place] [address]    Culture   Final    NO GROWTH Performed at [facility], [place] [address], [place], [place] [address]    Report Status 09/12/2021 FINAL  Final  MRSA Next Gen by PCR, Nasal     Status: None   Collection Time: 09/10/2021  9:13 PM   Specimen: Nasal Mucosa; Nasal Swab  Result Value Ref Range Status   MRSA by PCR Next Gen NOT DETECTED NOT DETECTED Final    Comment: (NOTE) The GeneXpert MRSA Assay (FDA approved for NASAL specimens only), is one component of a comprehensive MRSA colonization surveillance program. It is not intended to diagnose MRSA infection nor to guide or monitor treatment for MRSA infections. Test performance is not FDA approved in patients less than 6 years old. Performed at [facility], [place] [address], [place], [place] [address]   Drugs of Abuse     Component Value Date/Time   LABOPIA POSITIVE (A) 09/12/2021 1457   COCAINSCRNUR NONE DETECTED 08/30/2021 1457   LABBENZ NONE DETECTED 09/23/2021 1457   AMPHETMU NONE DETECTED 09/20/2021 1457   THCU NONE DETECTED 09/15/2021 1457   LABBARB NONE DETECTED 09/23/2021 1457     Ref. Range 09/25/2021 14:57  Appearance Latest Ref Range: CLEAR  CLEAR  Bilirubin Urine Latest Ref Range: NEGATIVE  NEGATIVE  Color, Urine Latest Ref Range: YELLOW  YELLOW  Glucose, UA Latest Ref Range: NEGATIVE mg/dL NEGATIVE  Hgb urine dipstick Latest Ref Range: NEGATIVE  NEGATIVE  Ketones, ur Latest Ref Range: NEGATIVE mg/dL 20 (A)  Leukocytes,Ua Latest Ref Range: NEGATIVE   NEGATIVE  Nitrite Latest Ref Range: NEGATIVE  NEGATIVE  pH Latest Ref Range: 5.0 - 8.0  7.0  Protein Latest Ref Range: NEGATIVE mg/dL 30 (A)  Specific Gravity, Urine Latest Ref Range: 1.005 - 1.030  1.026  Bacteria, UA Latest Ref Range: NONE SEEN  NONE SEEN  RBC / HPF Latest Ref Range: 0 - 5 RBC/hpf 0-5  Squamous Epithelial / LPF Latest Ref Range: 0 - 5  0-5  WBC, UA Latest Ref Range: 0 - 5 WBC/hpf 0-5    Ref. Range 09/23/2021 17:45  FIO2 Unknown 30.00  Mode Unknown NASAL CANNULA  O2 Content Latest Units: L/min 2.5  pH, Arterial Latest Ref Range: 7.350 - 7.450  7.444  pCO2 arterial Latest Ref Range: 32.0 - 48.0 mmHg 54.2 (H)  pO2, Arterial Latest Ref Range: 83.0 - 108.0 mmHg 35.2 (LL)  Acid-Base Excess Latest Ref Range: 0.0 - 2.0 mmol/L 11.2 (H)  Bicarbonate Latest Ref Range: 20.0 - 28.0 mmol/L 36.6 (H)  O2 Saturation Latest Units: % 66.2  Patient temperature Unknown 98.6  Collection site Unknown LEFT RADIAL  Allens test (pass/fail) Latest Ref Range: PASS  PASS   Imaging I have reviewed the images obtained:  MRI examination of the brain 09/23/2021: Significantly motion degraded exam. No sign of acute or subacute insult. Mild chronic small-vessel change of the cerebral hemispheric white matter.  CT angio chest PE 10/17: 1. No evidence of pulmonary embolism. 2. 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 New perilymphatic nodularity in the left  upper lobe adjacent to the mass, consistent with lymphangitic carcinomatosis. 3. 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 4. Trace left pleural effusion.  CT head wo contrast 10/17: No acute intracranial pathology.  Routine EEG pending    Assessment: 65 y.o. female with history as above who presented to the ED 10/17 for evaluation of altered mental status with progressive decline since early Monday morning now without intelligible speech. -  Examination reveals patient that is drowsy with BUE tremor with intention, incomprehensible vocalization with pain on attempts to passively move BLE and BUE, no intelligible speech, does not follow commands, and does not track examiner. Per patient's son at bedside, since the onset the morning of 10/17, she has progressively declined cognitively.  - Imaging: - CTH on arrival is without acute intracranial pathology,  - MRI brain is severely motion limited but within that limitation, there is no acute or subacute insult identified.  - Initial work up revealed patient who met SIRS criteria with unknown infectious source s/p empiric antibiotic coverage. Procalcitonin of 40 in the setting of lung adenoma on chemoradiation, tmax of 100.2 degrees, with leukopenia. Patient was also tachycardic with tachypnea with pO2 of 35.2 and pCO2 of 54.2 on admission. TSH and UA are unremarkable and B12 is slightly low for neurologic standards. There has been no identified source of infection. - Presentation is felt to be consistent with toxic metabolic versus infectious encephalopathy with SIRS criteria met with unidentified source. She has been initiated on empiric antibiotics. It is possible that Ms. Clagett also has a component of delirium versus progressive debilitation encephalopathy with chronic illnesses including chronic hypoxemia, progressive bronchogenic carcinoma in the setting of chemoradiation with new lymphangitis carcinomatosis and leukopenia, elevated temperature and heart rate, and day/night confusion.  - Routine EEG pending to rule out seizures versus status as etiology of encephalopathy.  - DDx includes meningitis or an MRI-negative viral encephalitis.  -Family does not want to take the risk of intubation and sedation, which would be required for LP as the patient becomes agitated and appears to be in pain with any repositioning as well as during motor and sensory exam. Empiric meningitis-dose antibiotics are  recommended.  - On cefepime, which has a potential side effect of neurotoxicity. Would consider switching to ceftriaxone. Will defer to primary team regarding ID consult to determine if switching to ceftriaxone is indicated.  - Agree with vancomycin - For empiric treatment of possible meningitis/encephalitis, ampicillin and acyclovir should also be added  Recommendations: - B12 low for neurology standards, supplement for goal of > 400. Can give high dose PO at 2000 mcg qd and obtain repeat level in 3 months.  - Cefepime potentially neurotoxic. Consider switching to ceftriaxone. May need to discuss with ID first.  - Adding ampicillin and acyclovir to regimen for empiric coverage of possible meningitis/encephalitis. Pharmacy assistance is appreciated. Family at this time declines LP after discussion of risks/benefits of sedation/intubation for this procedure, which would be necessary given patient agitation.  - Routine EEG ordered, pending   Pt seen by NP/Neuro and later by MD. Note/plan to be edited by MD as needed.  Anibal Henderson, AGAC-NP [facility] Pager: [phone]  Addendum: - EEG preliminary findings discussed with Dr. Hortense Ramal. Sharply contoured slowing in all leads with some triphasic morphology as well. No epileptiform discharges or electrographic seizures. Findings most consistent with a diffuse encephalopathy. Official report pending.   I have seen and examined the patient. I have discussed the assessment and recommendations with the  Neurology NP and made amendations as indicated. 65 y.o. female with history as above who presented to the ED 10/17 for evaluation of altered mental status with progressive decline since early Monday morning now without intelligible speech. Exam consistent with a moderate to severe encephalopathy. Recommendations as above.  Electronically signed: Dr. Kerney Elbe

## 2021-09-12 NOTE — Progress Notes (Signed)
Pharmacy Antibiotic Note  Patricia Sampson is a 65 y.o. female admitted on 09/14/2021 with sepsis.  Pharmacy consulted on 10/17 for vancomycin & cefepime dosing.  Today pharmacy consulted to dose ampicillin and acyclovir for empiric coverage for possible meningitis  Plan: Continue Cefepime 2 gm IV q8h Continue vancomycin 750 mg IV q12h  Ampicillin 2gm IV q6h Acyclovir 21m/kg IV q8h F/u renal function, WBC, temp, culture data Vancomycin levels as needed    Temp (24hrs), Avg:98.7 F (37.1 C), Min:97.6 F (36.4 C), Max:99.5 F (37.5 C)  Recent Labs  Lab 09/02/2021 1155 08/26/2021 1201 09/12/21 0248  WBC 2.0*  --  2.4*  CREATININE 0.95 0.80 0.63  LATICACIDVEN 1.7  --   --      Estimated Creatinine Clearance: 77.8 mL/min (by C-G formula based on SCr of 0.63 mg/dL).    Allergies  Allergen Reactions   Dyazide [Hydrochlorothiazide W-Triamterene]     hives   Zestoretic [Lisinopril-Hydrochlorothiazide]     Swelling    Antimicrobials this admission:  10/17 Vanc >> 10/17 cefepime >> 10/17 flagyl x 1 10/18 Ampicillin >> 10/18 Acyclovir >>  Dose adjustments this admission:  Microbiology results:  10/17 UCx  NG (final) 10/17 BCx2  NGTD Thank you for allowing pharmacy to be a part of this patient's care.   LLeone Haven PharmD 09/12/2021 8:20 PM

## 2021-09-12 NOTE — Progress Notes (Signed)
EEG complete - results pending 

## 2021-09-12 NOTE — Progress Notes (Addendum)
PROGRESS NOTE    Patricia Sampson  ITJ:[id number] DOB: May 19, 1956 DOA: 08/28/2021 PCP: Greig Right, MD   Brief Narrative: Patricia Sampson is a 65 y.o. female with a history of lung adenocarcinoma, chronic respiratory failure on home oxygen, hypertension, hyperlipidemia. Patient presented secondary to altered mental status of unknown etiology. On admission, she met SIRS criteria and was started on empiric antibiotics for unknown source.   Assessment & Plan:   Principal Problem:   SIRS (systemic inflammatory response syndrome) (HCC) Active Problems:   COPD (chronic obstructive pulmonary disease) (HCC)   Chronic respiratory failure with hypoxia (HCC)   SIRS Unknown source. Criteria met on admission.Procalcitonin of 0.40 in setting of cancer history on chemotherapy. Tmax of 100.2 F with leukopenia. Tachycardia has improved with persistent tachypnea. No source identified. Blood and urine cultures pending  Acute on chronic respiratory failure with hypoxia Chronic respiratory failure with hypercapnia Baseline oxygen use of 6 L/min via [place]. Patient required non-rebreather on admission. pO2 of 35.2 and pCO2 of 54.2 on admission. No associate acidemia. Weaned down to baseline oxygen rate overnight. -Continue oxygen at 6 L/min via [place]  Acute encephalopathy Unsure of etiology. Patient with aphasia.  -TSH, B12, folate, thiamine -EEG pending -Consult neurology -Possible consideration of LP -NPO -Start maintenance IV fluids and continue while NPO  Lung adenocarcinoma Patient follow-up with Dr. Mohamed/Dr. Lisbeth Renshaw. She is on chemoradiation therapy. Dr. Julien Nordmann added to care teams for inpatient recommendations.  Pancytopenia WBC improved. ANC on admission of 1500. Hemoglobin stable with slightly downtrended platelets -CBC with differential in AM  COPD Chronic respiratory failure with hypoxia No evidence of acute process. Home Spiriva and Symbicort listed as not taking.   DVT  prophylaxis: SCDs Code Status:   Code Status: Full Code Family Communication: Son at bedside Disposition Plan: Discharge likely in several days   Consultants:  Pulmonology Neurology  Procedures:  None  Antimicrobials: Vancomycin IV  Cefepime IV Flagyl IV    Subjective: Patient is non-verbal.  Objective: Vitals:   09/12/21 0300 09/12/21 0400 09/12/21 0500 09/12/21 0600  BP: 131/84 107/62 116/74 120/67  Pulse: (!) 125 (!) 114 (!) 117 (!) 117  Resp: (!) 33 (!) 26 (!) 26 (!) 24  Temp:  99.5 F (37.5 C)    TempSrc:  Axillary    SpO2: 90% 91% (!) 89% 100%    Intake/Output Summary (Last 24 hours) at 09/12/2021 0810 Last data filed at 09/12/2021 0734 Gross per 24 hour  Intake 4751.02 ml  Output --  Net 4751.02 ml   There were no vitals filed for this visit.  Examination:  General exam: Appears calm and comfortable and in no acute distress. conversant Respiratory: Diminished to auscultation. Respiratory effort normal with no intercostal retractions or use of accessory muscles Cardiovascular: S1 & S2 heard, RRR. No murmurs, rubs, gallops or clicks. No edema Gastrointestinal: Abdomen is non-distended, soft and non-tender. No masses felt. Decreased bowel sounds heard Neurologic: No focal neurological deficits. Aphasia. Good tone. Patient will not allow me to examine her eyes by closing her eyes shut when I attempt. Intermittently looks my way but does not appear to track Musculoskeletal: Tenderness of bilateral legs Skin: No cyanosis. No new rashes Psychiatry: Alert. Unable to assess orientation, memory or mood. Flat affect    Data Reviewed: I have personally reviewed following labs and imaging studies  CBC Lab Results  Component Value Date   WBC 2.4 (L) 09/12/2021   RBC 3.98 09/12/2021   HGB 10.0 (L) 09/12/2021  HCT 33.8 (L) 09/12/2021   MCV 84.9 09/12/2021   MCH 25.1 (L) 09/12/2021   PLT 111 (L) 09/12/2021   MCHC 29.6 (L) 09/12/2021   RDW 18.6 (H)  09/12/2021   LYMPHSABS 0.3 (L) 09/06/2021   MONOABS 0.2 09/21/2021   EOSABS 0.0 09/24/2021   BASOSABS 0.0 82/70/7867     Last metabolic panel Lab Results  Component Value Date   NA 137 09/12/2021   K 3.4 (L) 09/12/2021   CL 92 (L) 09/12/2021   CO2 33 (H) 09/12/2021   BUN 15 09/12/2021   CREATININE 0.63 09/12/2021   GLUCOSE 84 09/12/2021   GFRNONAA >60 09/12/2021   CALCIUM 8.3 (L) 09/12/2021   PROT 6.2 (L) 09/12/2021   ALBUMIN 2.4 (L) 09/12/2021   BILITOT 0.6 09/12/2021   ALKPHOS 72 09/12/2021   AST 14 (L) 09/12/2021   ALT 10 09/12/2021   ANIONGAP 12 09/12/2021    CBG (last 3)  No results for input(s): GLUCAP in the last 72 hours.   GFR: Estimated Creatinine Clearance: 77.8 mL/min (by C-G formula based on SCr of 0.63 mg/dL).  Coagulation Profile: Recent Labs  Lab 09/03/2021 1155 09/12/21 0248  INR 1.1 1.1    Recent Results (from the past 240 hour(s))  Resp Panel by RT-PCR (Flu A&B, Covid) Nasopharyngeal Swab     Status: None   Collection Time: 09/02/2021  1:50 PM   Specimen: Nasopharyngeal Swab; Nasopharyngeal(NP) swabs in vial transport medium  Result Value Ref Range Status   SARS Coronavirus 2 by RT PCR NEGATIVE NEGATIVE Final    Comment: (NOTE) SARS-CoV-2 target nucleic acids are NOT DETECTED.  The SARS-CoV-2 RNA is generally detectable in upper respiratory specimens during the acute phase of infection. The lowest concentration of SARS-CoV-2 viral copies this assay can detect is 138 copies/mL. A negative result does not preclude SARS-Cov-2 infection and should not be used as the sole basis for treatment or other patient management decisions. A negative result may occur with  improper specimen collection/handling, submission of specimen other than nasopharyngeal swab, presence of viral mutation(s) within the areas targeted by this assay, and inadequate number of viral copies(<138 copies/mL). A negative result must be combined with clinical observations,  patient history, and epidemiological information. The expected result is Negative.  Fact Sheet for Patients:  EntrepreneurPulse.com.au  Fact Sheet for Healthcare Providers:  IncredibleEmployment.be  This test is no t yet approved or cleared by the [place] FDA and  has been authorized for detection and/or diagnosis of SARS-CoV-2 by FDA under an Emergency Use Authorization (EUA). This EUA will remain  in effect (meaning this test can be used) for the duration of the COVID-19 declaration under Section 564(b)(1) of the Act, 21 U.S.C.section 360bbb-3(b)(1), unless the authorization is terminated  or revoked sooner.       Influenza A by PCR NEGATIVE NEGATIVE Final   Influenza B by PCR NEGATIVE NEGATIVE Final    Comment: (NOTE) The Xpert Xpress SARS-CoV-2/FLU/RSV plus assay is intended as an aid in the diagnosis of influenza from Nasopharyngeal swab specimens and should not be used as a sole basis for treatment. Nasal washings and aspirates are unacceptable for Xpert Xpress SARS-CoV-2/FLU/RSV testing.  Fact Sheet for Patients: EntrepreneurPulse.com.au  Fact Sheet for Healthcare Providers: IncredibleEmployment.be  This test is not yet approved or cleared by the [place] FDA and has been authorized for detection and/or diagnosis of SARS-CoV-2 by FDA under an Emergency Use Authorization (EUA). This EUA will remain in effect (meaning this test can be  used) for the duration of the COVID-19 declaration under Section 564(b)(1) of the Act, 21 U.S.C. section 360bbb-3(b)(1), unless the authorization is terminated or revoked.  Performed at [facility], [place] [address], [place], [place] [address]   MRSA Next Gen by PCR, Nasal     Status: None   Collection Time: 09/02/2021  9:13 PM   Specimen: Nasal Mucosa; Nasal Swab  Result Value Ref Range Status   MRSA by PCR Next Gen NOT DETECTED NOT  DETECTED Final    Comment: (NOTE) The GeneXpert MRSA Assay (FDA approved for NASAL specimens only), is one component of a comprehensive MRSA colonization surveillance program. It is not intended to diagnose MRSA infection nor to guide or monitor treatment for MRSA infections. Test performance is not FDA approved in patients less than 90 years old. Performed at [facility], [place] [address] [place], [place] [address]         Radiology Studies: CT Head Wo Contrast  Result Date: 09/21/2021 CLINICAL DATA:  Altered mental status EXAM: CT HEAD WITHOUT CONTRAST TECHNIQUE: Contiguous axial images were obtained from the base of the skull through the vertex without intravenous contrast. COMPARISON:  Brain MRI 07/26/2021 FINDINGS: Brain: There is no acute intracranial hemorrhage, extra-axial fluid collection, or acute infarct. Parenchymal volume is normal.  The ventricles are stable in size. There is no mass lesion.  There is no midline shift. Vascular: No hyperdense vessel or unexpected calcification. Skull: Normal. Negative for fracture or focal lesion. Sinuses/Orbits: The imaged paranasal sinuses are clear. The globes and orbits are unremarkable. Other: None. IMPRESSION: No acute intracranial pathology. Electronically Signed   By: Valetta Mole M.D.   On: 09/10/2021 13:47   CT Angio Chest Pulmonary Embolism (PE) W or WO Contrast  Result Date: 08/30/2021 CLINICAL DATA:  Altered mental status.  History of lung cancer. EXAM: CT ANGIOGRAPHY CHEST WITH CONTRAST TECHNIQUE: Multidetector CT imaging of the chest was performed using the standard protocol during bolus administration of intravenous contrast. Multiplanar CT image reconstructions and MIPs were obtained to evaluate the vascular anatomy. CONTRAST:  55m OMNIPAQUE IOHEXOL 350 MG/ML SOLN COMPARISON:  PET-CT dated June 23, 2021. CTA chest dated April 28, 2021. FINDINGS: Cardiovascular: Satisfactory opacification of the pulmonary  arteries to the segmental level. No evidence of pulmonary embolism. Unchanged enlarged central pulmonary arteries. Normal heart size. No pericardial effusion. No thoracic aortic aneurysm or dissection. Mediastinum/Nodes: Unchanged large left suprahilar lymph node measuring 3.8 x 2.4 cm (series 4, image 67), previously 3.7 x 2.3 cm. The thyroid gland, trachea, and esophagus demonstrate no significant findings. Lungs/Pleura: Enlarging anterior left upper lobe mass with similar mediastinal invasion, but progressive destruction of the anterior left second rib. New perilymphatic nodularity in the left upper lobe adjacent to the mass. Unchanged advanced centrilobular emphysema. Trace left pleural effusion. Minimal subsegmental atelectasis in the left greater than right lower lobes. No pneumothorax. Upper Abdomen: No acute abnormality. Musculoskeletal: Progressive destruction of the anterior left second rib. Review of the MIP images confirms the above findings. IMPRESSION: 1. No evidence of pulmonary embolism. 2. 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 New perilymphatic nodularity in the left upper lobe adjacent to the mass, consistent with lymphangitic carcinomatosis. 3. 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 4. Trace left pleural effusion. Electronically Signed   By: WTitus DubinM.D.   On: 08/29/2021 18:32   MR BRAIN WO CONTRAST  Result Date: 09/04/2021 CLINICAL DATA:  Transient ischemic attack.  Altered mental status. EXAM: MRI HEAD WITHOUT CONTRAST TECHNIQUE: Multiplanar, multiecho pulse sequences of the brain and surrounding structures were obtained without intravenous contrast. COMPARISON:  Head CT earlier same day.  MRI 07/26/2021. FINDINGS: Brain: The study suffers from considerable motion degradation due to poor cooperation. Diffusion imaging does not show any acute or subacute infarction. There are minimal  small vessel ischemic changes of the cerebral hemispheric white matter. No sign of large vessel infarction. No mass lesion, hemorrhage, hydrocephalus or extra-axial collection. Vascular: Major vessels at the base of the brain show flow. Skull and upper cervical spine: Negative Sinuses/Orbits: Clear/normal Other: None IMPRESSION: Significantly motion degraded exam. No sign of acute or subacute insult. Mild chronic small-vessel change of the cerebral hemispheric white matter. Electronically Signed   By: Nelson Chimes M.D.   On: 09/23/2021 20:19   CT ABDOMEN PELVIS W CONTRAST  Result Date: 09/05/2021 CLINICAL DATA:  Altered mental status, acute abdominal pain. EXAM: CT ABDOMEN AND PELVIS WITH CONTRAST TECHNIQUE: Multidetector CT imaging of the abdomen and pelvis was performed using the standard protocol following bolus administration of intravenous contrast. CONTRAST:  19m OMNIPAQUE IOHEXOL 350 MG/ML SOLN COMPARISON:  PET 06/23/2021 and CT abdomen pelvis 04/20/2021. FINDINGS: Lower chest: Image quality in the lung bases is degraded by respiratory motion, limiting diagnostic quality. Heart size normal. No pericardial or pleural effusion. Distal esophagus is unremarkable. Hepatobiliary: Subcentimeter low-attenuation lesion in the right hepatic lobe is too small to characterize. Liver and gallbladder are otherwise unremarkable. No biliary ductal dilatation. Pancreas: Negative. Spleen: Negative. Adrenals/Urinary Tract: Adrenal glands are unremarkable. Low-attenuation lesion in the right kidney measures 1.4 cm, too small to characterize but likely a cyst, possibly minimally complex with a peripheral calcification. Kidneys are otherwise unremarkable. Ureters are decompressed. Bilateral hip arthroplasties create streak artifact, obscuring the majority of the bladder. Stomach/Bowel: Stomach, small bowel and colon are unremarkable. Appendix is not definitely visualized. Vascular/Lymphatic: Atherosclerotic calcification of  the aorta. No pathologically enlarged lymph nodes. Reproductive: Uterus is visualized.  No adnexal mass. Other: No free fluid.  Mesenteries and peritoneum are unremarkable. Musculoskeletal: Bilateral hip arthroplasties. Degenerative changes in the spine. IMPRESSION: 1. No acute findings to explain the patient's abdominal pain. 2.  Aortic atherosclerosis (ICD10-I70.0). Electronically Signed   By: MLorin PicketM.D.   On: 09/18/2021 14:06   DG Chest Port 1 View  Result Date: 09/17/2021 CLINICAL DATA:  Altered mental status in a patient with history of pulmonary neoplasm. EXAM: PORTABLE CHEST 1 VIEW COMPARISON:  Comparison made with prior imaging studies from August of 2022. FINDINGS: EKG leads project over the chest. Trachea is midline. Cardiomediastinal contours and hilar structures are stable with fullness of the LEFT hilum and increased density about the LEFT upper chest compatible with known pulmonary neoplasm. No sign of lobar consolidation or visible pneumothorax. On limited assessment there is no acute skeletal process. Rib destruction of LEFT anterior ribs not as well demonstrated as on prior CT imaging. IMPRESSION: Stable findings of neoplasm in the LEFT chest. No acute cardiopulmonary disease. Electronically Signed   By: GZetta BillsM.D.   On: 09/12/2021 11:43        Scheduled Meds:  acetaminophen  1,000 mg Oral Once   acetaminophen  325 mg Oral Once   And   oxyCODONE  5 mg Oral Once   chlorhexidine  15 mL Mouth Rinse BID   Chlorhexidine Gluconate Cloth  6 each Topical Daily   mouth rinse  15 mL Mouth Rinse q12n4p  Continuous Infusions:  ceFEPime (MAXIPIME) IV Stopped (09/12/21 0716)   metronidazole Stopped (09/12/21 0043)   potassium chloride 10 mEq (09/12/21 0743)   vancomycin Stopped (09/12/21 0040)     LOS: 1 day     Cordelia Poche, MD [facility] 09/12/2021, 8:10 AM  If 7PM-7AM, please contact night-coverage [email or web address]

## 2021-09-13 ENCOUNTER — Ambulatory Visit: Payer: 59

## 2021-09-13 ENCOUNTER — Other Ambulatory Visit: Payer: Self-pay

## 2021-09-13 DIAGNOSIS — R6510 Systemic inflammatory response syndrome (SIRS) of non-infectious origin without acute organ dysfunction: Secondary | ICD-10-CM | POA: Diagnosis not present

## 2021-09-13 DIAGNOSIS — G039 Meningitis, unspecified: Secondary | ICD-10-CM

## 2021-09-13 DIAGNOSIS — I4892 Unspecified atrial flutter: Secondary | ICD-10-CM

## 2021-09-13 DIAGNOSIS — G9340 Encephalopathy, unspecified: Secondary | ICD-10-CM | POA: Diagnosis not present

## 2021-09-13 DIAGNOSIS — R4182 Altered mental status, unspecified: Secondary | ICD-10-CM | POA: Diagnosis not present

## 2021-09-13 LAB — CBC WITH DIFFERENTIAL/PLATELET
Abs Immature Granulocytes: 0.02 10*3/uL (ref 0.00–0.07)
Basophils Absolute: 0 10*3/uL (ref 0.0–0.1)
Basophils Relative: 1 %
Eosinophils Absolute: 0 10*3/uL (ref 0.0–0.5)
Eosinophils Relative: 1 %
HCT: 28.4 % — ABNORMAL LOW (ref 36.0–46.0)
Hemoglobin: 8.3 g/dL — ABNORMAL LOW (ref 12.0–15.0)
Immature Granulocytes: 1 %
Lymphocytes Relative: 14 %
Lymphs Abs: 0.3 10*3/uL — ABNORMAL LOW (ref 0.7–4.0)
MCH: 25.5 pg — ABNORMAL LOW (ref 26.0–34.0)
MCHC: 29.2 g/dL — ABNORMAL LOW (ref 30.0–36.0)
MCV: 87.4 fL (ref 80.0–100.0)
Monocytes Absolute: 0.3 10*3/uL (ref 0.1–1.0)
Monocytes Relative: 15 %
Neutro Abs: 1.5 10*3/uL — ABNORMAL LOW (ref 1.7–7.7)
Neutrophils Relative %: 68 %
Platelets: 129 10*3/uL — ABNORMAL LOW (ref 150–400)
RBC: 3.25 MIL/uL — ABNORMAL LOW (ref 3.87–5.11)
RDW: 18.6 % — ABNORMAL HIGH (ref 11.5–15.5)
WBC: 2.1 10*3/uL — ABNORMAL LOW (ref 4.0–10.5)
nRBC: 0 % (ref 0.0–0.2)

## 2021-09-13 MED ORDER — ADENOSINE 6 MG/2ML IV SOLN
INTRAVENOUS | Status: AC
  Administered 2021-09-13: 6 mg
  Filled 2021-09-13: qty 2

## 2021-09-13 MED ORDER — FOLIC ACID 5 MG/ML IJ SOLN
1.0000 mg | Freq: Once | INTRAMUSCULAR | Status: AC
  Administered 2021-09-13: 1 mg via INTRAVENOUS
  Filled 2021-09-13: qty 0.2

## 2021-09-13 MED ORDER — AMIODARONE LOAD VIA INFUSION: 150.0000 mg | Freq: Once | INTRAVENOUS | Status: DC

## 2021-09-13 MED ORDER — HEPARIN BOLUS VIA INFUSION
3000.0000 [IU] | Freq: Once | INTRAVENOUS | Status: AC
  Administered 2021-09-13: 3000 [IU] via INTRAVENOUS
  Filled 2021-09-13: qty 3000

## 2021-09-13 MED ORDER — SODIUM CHLORIDE 0.9 % IV SOLN
2.0000 g | Freq: Two times a day (BID) | INTRAVENOUS | Status: DC
  Administered 2021-09-13 – 2021-09-18 (×11): 2 g via INTRAVENOUS
  Filled 2021-09-13 (×11): qty 20

## 2021-09-13 MED ORDER — DILTIAZEM LOAD VIA INFUSION
10.0000 mg | Freq: Once | INTRAVENOUS | Status: AC
  Administered 2021-09-13: 10 mg via INTRAVENOUS
  Filled 2021-09-13: qty 10

## 2021-09-13 MED ORDER — DILTIAZEM HCL-DEXTROSE 125-5 MG/125ML-% IV SOLN (PREMIX)
5.0000 mg/h | INTRAVENOUS | Status: DC
  Administered 2021-09-13: 5 mg/h via INTRAVENOUS
  Filled 2021-09-13: qty 125

## 2021-09-13 MED ORDER — HEPARIN (PORCINE) 25000 UT/250ML-% IV SOLN
1250.0000 [IU]/h | INTRAVENOUS | Status: DC
  Administered 2021-09-13: 1100 [IU]/h via INTRAVENOUS
  Filled 2021-09-13: qty 250

## 2021-09-13 MED ORDER — CYANOCOBALAMIN 1000 MCG/ML IJ SOLN
1000.0000 ug | Freq: Once | INTRAMUSCULAR | Status: AC
  Administered 2021-09-13: 1000 ug via INTRAMUSCULAR
  Filled 2021-09-13: qty 1

## 2021-09-13 MED ORDER — AMIODARONE HCL IN DEXTROSE 360-4.14 MG/200ML-% IV SOLN
30.0000 mg/h | INTRAVENOUS | Status: DC
  Administered 2021-09-14 – 2021-09-19 (×11): 30 mg/h via INTRAVENOUS
  Filled 2021-09-13 (×11): qty 200

## 2021-09-13 MED ORDER — SODIUM CHLORIDE 0.9 % IV SOLN: INTRAVENOUS | Status: DC | PRN

## 2021-09-13 MED ORDER — AMIODARONE HCL IN DEXTROSE 360-4.14 MG/200ML-% IV SOLN
INTRAVENOUS | Status: AC
  Filled 2021-09-13: qty 200

## 2021-09-13 MED ORDER — SODIUM CHLORIDE 0.9 % IV SOLN: 1.0000 mg | Freq: Once | INTRAVENOUS | Status: DC

## 2021-09-13 MED ORDER — PHENYLEPHRINE HCL-NACL 20-0.9 MG/250ML-% IV SOLN: 25.0000 ug/min | INTRAVENOUS | Status: DC

## 2021-09-13 MED ORDER — AMIODARONE LOAD VIA INFUSION
150.0000 mg | Freq: Once | INTRAVENOUS | Status: AC
  Administered 2021-09-13: 150 mg via INTRAVENOUS
  Filled 2021-09-13: qty 83.34

## 2021-09-13 MED ORDER — SODIUM CHLORIDE 0.9 % IV SOLN
250.0000 mL | INTRAVENOUS | Status: DC
  Administered 2021-09-15 – 2021-09-16 (×2): 250 mL via INTRAVENOUS

## 2021-09-13 MED ORDER — AMIODARONE HCL IN DEXTROSE 360-4.14 MG/200ML-% IV SOLN
60.0000 mg/h | INTRAVENOUS | Status: DC
  Administered 2021-09-13 (×2): 60 mg/h via INTRAVENOUS
  Filled 2021-09-13: qty 200

## 2021-09-13 MED ORDER — VANCOMYCIN HCL IN DEXTROSE 1-5 GM/200ML-% IV SOLN
1000.0000 mg | Freq: Two times a day (BID) | INTRAVENOUS | Status: DC
  Administered 2021-09-13 – 2021-09-15 (×4): 1000 mg via INTRAVENOUS
  Filled 2021-09-13 (×4): qty 200

## 2021-09-13 MED ORDER — SODIUM CHLORIDE 0.9 % IV BOLUS
500.0000 mL | Freq: Once | INTRAVENOUS | Status: AC
  Administered 2021-09-13: 500 mL via INTRAVENOUS

## 2021-09-13 NOTE — Progress Notes (Signed)
PROGRESS NOTE  Patricia Sampson FXT:[id number] DOB: [birth year] DOA: 09/20/2021 PCP: Greig Right, MD   LOS: 2 days   Brief Narrative / Interim history: 65 year old female with history of lung adenocarcinoma, diagnosed in 2022 currently undergoing chemotherapy as well as radiation therapy, chronic respiratory failure on home oxygen, hypertension, hyperlipidemia comes into the hospital brought by family for altered mental status.  She met SIRS criteria on admission and started on antibiotics.  Subjective / 24h Interval events: Sleeping when I entered the room, but wakes up easily, is confused, agitated and impulsive.  Does not answer my questions.  Husband is at bedside.  Assessment & Plan: Principal Problem SIRS-met criteria on admission with fever, leukopenia, tachycardia and tachypnea.  She was started on broad-spectrum antibiotics including meningitis coverage with vancomycin, ceftriaxone, ampicillin and acyclovir. Patient's husband reports that she was in her normal state of health on _0 /17/22 2127            DVT prophylaxis: SCDs Start: 09/14/2021 2128     Code Status: Full Code  Family Communication: Husband is present at bedside  Status is: Inpatient  Remains inpatient appropriate because: Persistent altered mental status  Level of care: Stepdown  Consultants:  PCCM Neurology  Procedures:  None   Objective: Vitals:   09/13/21 0800  09/13/21 0900 09/13/21 1000 09/13/21 1100  BP: (!) 103/54 (!) 104/56 (!) 104/53 108/62  Pulse: (!) 114 (!) 110 (!) 111 (!) 115  Resp: (!) 32 (!) 27 (!) 32 (!) 29  Temp: 98.3 F (36.8 C)     TempSrc: Axillary     SpO2: (!) 87% 94% 91% 94%     Intake/Output Summary (Last 24 hours) at 09/13/2021 1147 Last data filed at 09/13/2021 1049 Gross per 24 hour  Intake 1675.27 ml  Output 400 ml  Net 1275.27 ml   There were no vitals filed for this visit.  Examination:  Constitutional: No apparent discomfort, agitated at times Eyes: no scleral icterus ENMT: Mucous membranes are moist.  Neck: normal, supple Respiratory: Clear on anterior auscultation, no wheezing heard.  Tachypneic. Cardiovascular: Regular rate and rhythm, no murmurs / rubs / gallops. No LE edema.  Abdomen: non distended, no tenderness. Bowel sounds positive.  Musculoskeletal: no clubbing / cyanosis.  Skin: no rashes Neurologic: Does not follow commands but appears to move all 4 extremities independently   Data Reviewed: I have independently reviewed following labs and imaging studies   CBC: Recent Labs  Lab 09/13/2021 1155 09/18/2021 1201 09/12/21 0248 09/13/21 0254  WBC 2.0*  --  2.4* 2.1*  NEUTROABS 1.5*  --   --  1.5*  HGB 10.0* 10.9* 10.0* 8.3*  HCT 33.6* 32.0* 33.8* 28.4*  MCV 84.2  --  84.9 87.4  PLT 139*  --  111* 119*   Basic Metabolic Panel: Recent Labs  Lab 09/23/2021 1155 09/16/2021 1201 09/12/21 0248  NA 135 135 137  K 3.5 3.5 3.4*  CL 86* 86* 92*  CO2 38*  --  33*  GLUCOSE 107* 106* 84  BUN _0 CREATININE 0.95 0.80 0.63  CALCIUM 8.6*  --  8.3*   Liver Function Tests: Recent Labs  Lab 09/12/2021 1155 09/12/21 0248  AST 17 14*  ALT 10 10  ALKPHOS 73 72  BILITOT 0.6 0.6  PROT 7.1 6.2*  ALBUMIN 2.7* 2.4*   Coagulation Profile: Recent Labs  Lab 09/16/2021 1155 09/12/21 0248  INR 1.1 1.1   HbA1C: No results for input(s): HGBA1C in the last 72 hours. CBG: No results for input(s): GLUCAP in the last 168 hours.  Recent Results (from the past 240 hour(s))  Blood Culture (routine x 2)     Status: None (Preliminary result)   Collection Time: 09/18/2021 11:55 AM   Specimen: BLOOD RIGHT HAND  Result Value Ref Range Status    Specimen Description   Final    BLOOD RIGHT HAND Performed at [facility] [address] [place], [place] [address]    Special Requests   Final    BOTTLES DRAWN AEROBIC AND ANAEROBIC Blood Culture adequate volume Performed at [facility] [address] [place], [place] [address]    Culture   Final    NO GROWTH 2 DAYS Performed at [facility] [address] [place], [place] [address]    Report Status PENDING  Incomplete  Blood Culture (routine x 2)     Status: None (Preliminary result)   Collection Time: 09/01/2021 11:55 AM   Specimen: BLOOD  Result Value Ref Range Status   Specimen Description   Final    BLOOD LEFT ANTECUBITAL Performed at [facility] [address], [place],  [address]    Special Requests   Final    BOTTLES DRAWN AEROBIC AND ANAEROBIC Blood  Culture results may not be optimal due to an excessive volume of blood received in culture bottles Performed at [facility] [address], [place], [place] [address]    Culture   Final    NO GROWTH 2 DAYS Performed at [facility] [address], [place], [place] [address]    Report Status PENDING  Incomplete  Resp Panel by RT-PCR (Flu A&B, Covid) Nasopharyngeal Swab     Status: None   Collection Time: 09/12/2021  1:50 PM   Specimen: Nasopharyngeal Swab; Nasopharyngeal(NP) swabs in vial transport medium  Result Value Ref Range Status   SARS Coronavirus 2 by RT PCR NEGATIVE NEGATIVE Final    Comment: (NOTE) SARS-CoV-2 target nucleic acids are NOT DETECTED.  The SARS-CoV-2 RNA is generally detectable in upper respiratory specimens during the acute phase of infection. The lowest concentration of SARS-CoV-2 viral copies this assay can detect is 138 copies/mL. A negative result does not preclude SARS-Cov-2 infection and should not be used as the sole basis for treatment or other patient management decisions. A  negative result may occur with  improper specimen collection/handling, submission of specimen other than nasopharyngeal swab, presence of viral mutation(s) within the areas targeted by this assay, and inadequate number of viral copies(<138 copies/mL). A negative result must be combined with clinical observations, patient history, and epidemiological information. The expected result is Negative.  Fact Sheet for Patients:  EntrepreneurPulse.com.au  Fact Sheet for Healthcare Providers:  IncredibleEmployment.be  This test is no t yet approved or cleared by the [place] FDA and  has been authorized for detection and/or diagnosis of SARS-CoV-2 by FDA under an Emergency Use Authorization (EUA). This EUA will remain  in effect (meaning this test can be used) for the duration of the COVID-19 declaration under Section 564(b)(1) of the Act, 21 U.S.C.section 360bbb-3(b)(1), unless the authorization is terminated  or revoked sooner.       Influenza A by PCR NEGATIVE NEGATIVE Final   Influenza B by PCR NEGATIVE NEGATIVE Final    Comment: (NOTE) The Xpert Xpress SARS-CoV-2/FLU/RSV plus assay is intended as an aid in the diagnosis of influenza from Nasopharyngeal swab specimens and should not be used as a sole basis for treatment. Nasal washings and aspirates are unacceptable for Xpert Xpress SARS-CoV-2/FLU/RSV testing.  Fact Sheet for Patients: EntrepreneurPulse.com.au  Fact Sheet for Healthcare Providers: IncredibleEmployment.be  This test is not yet approved or cleared by the [place] FDA and has been authorized for detection and/or diagnosis of SARS-CoV-2 by FDA under an Emergency Use Authorization (EUA). This EUA will remain in effect (meaning this test can be used) for the duration of the COVID-19 declaration under Section 564(b)(1) of the Act, 21 U.S.C. section 360bbb-3(b)(1), unless the authorization  is terminated or revoked.  Performed at [facility], [place] [address] [place], [place] [address]   Urine Culture     Status: None   Collection Time: 09/23/2021  2:57 PM   Specimen: Urine, Random  Result Value Ref Range Status   Specimen Description   Final    URINE, RANDOM Performed at [facility] [address], [place], [place] [address]    Special Requests   Final    NONE Performed at [facility], [place] [address], [place], [place] [address]    Culture   Final    NO GROWTH Performed at [facility], [place] [address] [place], [place] [address]    Report Status  2021/09/29 FINAL  Final  MRSA Next Gen by PCR, Nasal     Status: None   Collection Time: 09/20/2021  9:13 PM   Specimen: Nasal Mucosa; Nasal Swab  Result Value Ref Range Status   MRSA by PCR Next Gen NOT DETECTED NOT DETECTED Final    Comment: (NOTE) The GeneXpert MRSA Assay (FDA approved for NASAL specimens only), is one component of a comprehensive MRSA colonization surveillance program. It is not intended to diagnose MRSA infection nor to guide or monitor treatment for MRSA infections. Test performance is not FDA approved in patients less than 72 years old. Performed at [facility], [place] [address], [place], [place] [address]      Radiology Studies: EEG adult  Result Date: 09-29-21 Lora Havens, MD     09/29/21  8:19 PM Patient Name: Patricia Sampson MRN: [id number] Epilepsy Attending: Lora Havens Referring Physician/Provider: Dr Cherylann Ratel Date: Sep 29, 2021 Duration: 26.41 mins Patient history: 65 year old female with altered mental status.  EEG to evaluate for seizure. Level of alertness: Awake AEDs during EEG study: None Technical aspects: This EEG study was done with scalp electrodes positioned according to the 10-20 International system of electrode placement. Electrical activity was acquired at a sampling rate of  _0  and reviewed with a high frequency filter of _1  and a low frequency filter of _2 . EEG data were recorded continuously and digitally stored. Description: No posterior dominant rhythm was seen. EEG showed continuous generalized sharply contoured 3 to 6 Hz theta-delta slowing, at times with triphasic morphology. Hyperventilation and photic stimulation were not performed.   ABNORMALITY - Continuous slow, generalized -Triphasic waves, generalized IMPRESSION: This study is suggestive of moderate diffuse encephalopathy, nonspecific etiology but could be secondary to toxic-metabolic causes, cefepime toxicity. No seizures or definite epileptiform discharges were seen throughout the recording. Priyanka Mont Dutton, MD, PhD [facility]  Between 7 am - 7 pm I am available, please contact me via Amion (for emergencies) or Securechat (non urgent messages)  Between 7 pm - 7 am I am not available, please contact night coverage MD/APP via Amion

## 2021-09-13 NOTE — Progress Notes (Signed)
Chaplain engaged in a follow-up visit with Patricia Sampson's family.  Chaplain was able to meet with them and learn about Patricia Sampson's healthcare journey yesterday and today.  Her son shared about his concerns for them to test for meningitis giving her health history and current state of weakness.  Chaplain affirmed his worries and care for his mom.  Chaplain also encouraged him to speak with [facility] physician about the possibilities surrounding that procedure.    Chaplain continues to check-in, offer support, and provide presence.    09/13/21 1400  Clinical Encounter Type  Visited With Patient and family together  Visit Type Initial;Follow-up

## 2021-09-13 NOTE — Progress Notes (Signed)
[facility] Progress Note Patient Name: Patricia Sampson DOB: 1956/08/15 MRN: [id number]   Date of Service  09/13/2021  HPI/Events of Note  MAP's hovering in low 60's with oliguria  eICU Interventions  NEO added     Intervention Category Intermediate Interventions: Hypotension - evaluation and management  Tilden Dome 09/13/2021, 1:09 AM

## 2021-09-13 NOTE — Progress Notes (Signed)
Pharmacy Antibiotic Note  Patricia Sampson is a 64 y.o. female admitted on 08/29/2021 with sepsis, r/o meningitis.  Pharmacy consulted on 10/17 for vancomycin dosing.  Plan: Increase vancomycin dosing to 1000 mg iv q 12 hours to target trough 15-20 mcg/ml for meningitis Ampicillin 2gm IV q6h Acyclovir 2m/kg IV q8h Ceftriaxone 2 g iv q 12 hours F/u renal function, WBC, temp, culture data Vancomycin levels as needed    Temp (24hrs), Avg:99 F (37.2 C), Min:98.1 F (36.7 C), Max:100.3 F (37.9 C)  Recent Labs  Lab 08/26/2021 1155 09/21/2021 1201 09/12/21 0248 09/13/21 0254  WBC 2.0*  --  2.4* 2.1*  CREATININE 0.95 0.80 0.63  --   LATICACIDVEN 1.7  --   --   --      Estimated Creatinine Clearance: 77.8 mL/min (by C-G formula based on SCr of 0.63 mg/dL).    Allergies  Allergen Reactions   Dyazide [Hydrochlorothiazide W-Triamterene]     hives   Zestoretic [Lisinopril-Hydrochlorothiazide]     Swelling    Antimicrobials this admission:  10/17 Vanc >> 10/17 cefepime >> 10/19 10/19 CTX >> 10/17 flagyl x 1 10/18 Ampicillin >> 10/18 Acyclovir >>  Dose adjustments this admission:  10/19 inc vanc to 1000 mg q 12 hours  Microbiology results:  10/17 UCx  NG (final) 10/17 BCx2  NGTD 10/17 MRSA PCR: neg  Thank you for allowing pharmacy to be a part of this patient's care.  CUlice DashD  09/13/2021 12:42 PM

## 2021-09-13 NOTE — Progress Notes (Signed)
NAME:  Patricia Sampson, MRN:  [id number], DOB:  1956/07/06, LOS: 2 ADMISSION DATE:  09/19/2021, CONSULTATION DATE: 09/22/2021 REFERRING MD: Dr. Marylyn Ishihara, CHIEF COMPLAINT: Encephalopathy  History of Present Illness:  65 year old woman with very severe COPD, associated chronic hypoxemic respiratory failure (4-8 L/min), hypertension, hyperlipidemia.  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 She was noted to have altered mental status on the morning of 10/17, appeared to be normal when she went to bed the night before.  In the emergency department she was tachycardic, tachypneic with pancytopenia.  Last chemotherapy infusion appears to have been 09/05/2021.  Acute metabolic encephalopathy.  She was treated empirically for possible sepsis, source unknown. Home med list with Percocet, Neurontin.  Her husband notes that he does not believe she is been taking any of her medicines for the last 2 to 3 days.   Pertinent  Medical History   Past Medical History:  Diagnosis Date   Angioedema    COPD (chronic obstructive pulmonary disease) (HCC)    FHx: migraine headaches    Hyperlipidemia    Hypertension    Seasonal allergies     Significant Hospital Events: Including procedures, antibiotic start and stop dates in addition to other pertinent events   CT abdomen pelvis 10/17 >> no acute findings Head CT 10/17 >> normal CT-PA chest 10/17 >> no new infiltrates or significant change in the left upper lobe mass.  No PE MRI brain 10/17 >> Motion degradation compromise interpretation, no acute or subacute infarct, no mass lesion EEG 10/18 with generalized slowing, no evidence of active seizures or epileptiform activity 10/18 acyclovir and ampicillin added to cover possible meningitis  Interim History / Subjective:   EEG nonspecific slowing as above Patient has had some intermittent agitation and anxiety, was  started on Precedex infusion overnight.  Then some resultant hypotension, phenylephrine was ordered but not started. I/O+ 5.5 L total   Objective   Blood pressure (!) 97/55, pulse 95, temperature 98.7 F (37.1 C), temperature source Axillary, resp. rate 16, SpO2 99 %.        Intake/Output Summary (Last 24 hours) at 09/13/2021 0850 Last data filed at 09/13/2021 0235 Gross per 24 hour  Intake 1091.86 ml  Output 400 ml  Net 691.86 ml   There were no vitals filed for this visit.  Examination: General: In bed, sitting up on elbows HENT: Pupils are equal.  Oropharynx clear, moist Lungs: Distant, decreased at the bases, no wheezes or crackles Cardiovascular: Tachycardic 105-115, regular, no murmur Abdomen: Nondistended, positive bowel sounds Extremities: No edema Neuro: Sitting up w eyes open, occasionally will turn to voice but not reliably.  Unclear whether she is tracking.  She does blink to threat.  Aphasic.  Some rhythmic movement of bilateral lower extremities with palpation, also some associated apparent discomfort.  She vocalized (unintelligible) when touching her lower extremities, even with soft touch   Resolved Hospital Problem list     Assessment & Plan:  Acute encephalopathy, appreciate neurology assistance with the case.  Some evolving evidence for an agitated delirium.  Still considering a toxic metabolic cause, no clear source of infection but consider meningitis.  LP deferred due to associated risk of performing.  No evidence for CVA, no evidence for active seizures or recent seizure.  Vitamin B12 borderline low.  She is on gabapentin at home but unlikely for this to represent withdrawal. -In absence of any obvious infection, can stop  metronidazole.  Plan to change cefepime to ceftriaxone to cover meningitis and also avoid neurotoxicity.  Continue ampicillin and acyclovir.  No plans for LP at this time. -Discontinue Precedex.  If we do need to treat her for some degree of  agitation we can use low-dose intermittent meds, hopefully p.o. Goal would be to avoid anything that would cause oversedation or cloud our ability to perform a good neuro exam -Ensure adequate oxygenation -Neurology recommends B12 supplementation, goal > 400  Acute on chronic hypoxemic respiratory failure.  She has severe COPD, is on 6-8 L/min at baseline. Severe COPD -Continue O2, goal SPO2 > 90%. -Based on her chronic respiratory failure she would do poorly if she were to require mechanical ventilation.  She is not appear to be at risk for this at this time.  Broached the subject with her husband at presentation.  They are considering goals of care, would like for her to be able to participate when her neurological status will allow.  She is full code at this time.  Adenocarcinoma of the lung, on chemoradiation.   Pancytopenia on presentation without any clear evidence for infection or evidence for bleeding. -Continue to follow CBC   Best Practice (right click and "Reselect all SmartList Selections" daily)   Diet/type: Regular consistency (see orders) DVT prophylaxis: other GI prophylaxis: N/A Lines: N/A Foley:  N/A Code Status:  full code Last date of multidisciplinary goals of care discussion [10/17. Discussed GOC briefly with pt's husband 10/17 -- need further discussions and would like to include pt, her son. She is full code at this time.]  Labs   CBC: Recent Labs  Lab 08/26/2021 1155 08/27/2021 1201 09/12/21 0248 09/13/21 0254  WBC 2.0*  --  2.4* 2.1*  NEUTROABS 1.5*  --   --  1.5*  HGB 10.0* 10.9* 10.0* 8.3*  HCT 33.6* 32.0* 33.8* 28.4*  MCV 84.2  --  84.9 87.4  PLT 139*  --  111* 129*    Basic Metabolic Panel: Recent Labs  Lab 09/08/2021 1155 09/10/2021 1201 09/12/21 0248  NA 135 135 137  K 3.5 3.5 3.4*  CL 86* 86* 92*  CO2 38*  --  33*  GLUCOSE 107* 106* 84  BUN _0 CREATININE 0.95 0.80 0.63  CALCIUM 8.6*  --  8.3*   GFR: Estimated Creatinine  Clearance: 77.8 mL/min (by C-G formula based on SCr of 0.63 mg/dL). Recent Labs  Lab 09/07/2021 1155 09/12/21 0248 09/12/21 0646 09/13/21 0254  PROCALCITON  --   --  0.40  --   WBC 2.0* 2.4*  --  2.1*  LATICACIDVEN 1.7  --   --   --     Liver Function Tests: Recent Labs  Lab 09/18/2021 1155 09/12/21 0248  AST 17 14*  ALT 10 10  ALKPHOS 73 72  BILITOT 0.6 0.6  PROT 7.1 6.2*  ALBUMIN 2.7* 2.4*   No results for input(s): LIPASE, AMYLASE in the last 168 hours. Recent Labs  Lab 09/05/2021 1155 09/09/2021 2302  AMMONIA 18 31    ABG    Component Value Date/Time   PHART 7.444 09/16/2021 1745   PCO2ART 54.2 (H) 09/13/2021 1745   PO2ART 35.2 (LL) 09/16/2021 1745   HCO3 36.6 (H) 08/26/2021 1745   TCO2 37 (H) 09/02/2021 1201   O2SAT 66.2 09/18/2021 1745     Coagulation Profile: Recent Labs  Lab 09/01/2021 1155 09/12/21 0248  INR 1.1 1.1    Cardiac Enzymes: No results for input(s): CKTOTAL,  CKMB, CKMBINDEX, TROPONINI in the last 168 hours.  HbA1C: No results found for: HGBA1C  CBG: No results for input(s): GLUCAP in the last 168 hours.   Critical care time: NA      Baltazar Apo, MD, PhD 09/13/2021, 8:50 AM [facility] [phone] or if no answer before 7:00PM call [phone] For any issues after 7:00PM please call eLink [phone]

## 2021-09-13 NOTE — Progress Notes (Signed)
ANTICOAGULATION CONSULT NOTE - Initial Consult  Pharmacy Consult for heparin Indication: atrial fibrillation  Allergies  Allergen Reactions   Dyazide [Hydrochlorothiazide W-Triamterene]     hives   Zestoretic [Lisinopril-Hydrochlorothiazide]     Swelling     Patient Measurements:   Heparin Dosing Weight: 74.6 kg  Vital Signs: Temp: 100.3 F (37.9 C) (10/19 1200) Temp Source: Axillary (10/19 1200) BP: 124/72 (10/19 1600) Pulse Rate: 127 (10/19 1600)  Labs: Recent Labs    09/04/2021 1155 09/20/2021 1201 09/12/21 0248 09/13/21 0254  HGB 10.0* 10.9* 10.0* 8.3*  HCT 33.6* 32.0* 33.8* 28.4*  PLT 139*  --  111* 129*  APTT 32  --   --   --   LABPROT 14.2  --  14.6  --   INR 1.1  --  1.1  --   CREATININE 0.95 0.80 0.63  --     Estimated Creatinine Clearance: 77.8 mL/min (by C-G formula based on SCr of 0.63 mg/dL).   Medical History: Past Medical History:  Diagnosis Date   Angioedema    COPD (chronic obstructive pulmonary disease) (HCC)    FHx: migraine headaches    Hyperlipidemia    Hypertension    Seasonal allergies     Medications:  Scheduled:   amiodarone  150 mg Intravenous Once   chlorhexidine  15 mL Mouth Rinse BID   Chlorhexidine Gluconate Cloth  6 each Topical Daily   heparin  3,000 Units Intravenous Once   mouth rinse  15 mL Mouth Rinse q12n4p   Infusions:   sodium chloride 10 mL/hr at 09/13/21 0235   acyclovir (ZOVIRAX) [phone] mg IVPB Stopped (09/13/21 1451)   amiodarone 60 mg/hr (09/13/21 1807)   Followed by   Derrill Memo ON 09/14/2021] amiodarone     ampicillin (OMNIPEN) IV Stopped (09/13/21 1708)   cefTRIAXone (ROCEPHIN)  IV Stopped (09/13/21 1040)   diltiazem (CARDIZEM) infusion 5 mg/hr (09/13/21 1739)   heparin     sodium chloride     [START ON 09/14/2021] vancomycin      Assessment: 65 yo female with new atrial fibrillation/flutter.  No history of anticoagulation PTA and none received this admit.  Patient case discussed with cardiology who  recommended starting patient on heparin IV.  Baseline aPTT wnl at 32 seconds and INR 1.1.  Patient is pancytopenic likely due to chemo/radiation therapy for lung adenocarcinoma.  Pharmacy consulted to dose heparin.    Today, 09/13/21 -Hgb down from 10 >8.3, plts up to 129 from 111 -No s/sx bleeding per MD  Goal of Therapy:  Heparin level 0.3-0.7 units/ml Monitor platelets by anticoagulation protocol: Yes   Plan:  Give 3000 units bolus x 1 Start heparin infusion at 1100 units/hr Check anti-Xa level in 6 hours and daily while on heparin Continue to monitor H&H and platelets Monitor for s/sx bleeding F/u ability to transition to oral anticoagulant  Dimple Nanas, PharmD 09/13/2021 6:17 PM

## 2021-09-13 NOTE — Progress Notes (Signed)
Subjective:   Patient can not participate in ROS due to no speech and ? comprehension. Family at bedside, states she has been sitting up in bed some and seems to be tracking them better than prior.   Objective: Current vital signs: BP (!) 103/54   Pulse (!) 114   Temp 98.3 F (36.8 C) (Axillary)   Resp (!) 32   SpO2 (!) 87%  Vital signs in last 24 hours: Temp:  [98.1 F (36.7 C)-99.5 F (37.5 C)] 98.3 F (36.8 C) (10/19 0800) Pulse Rate:  [95-136] 114 (10/19 0800) Resp:  [15-32] 32 (10/19 0800) BP: (93-164)/(44-78) 103/54 (10/19 0800) SpO2:  [85 %-100 %] 87 % (10/19 0800)  Intake/Output from previous day: 10/18 0701 - 10/19 0700 In: 1333.9 [I.V.:27.5; IV Piggyback:1306.4] Out: 400 [Urine:400] Intake/Output this shift: Total I/O In: 786.3 [I.V.:26; IV Piggyback:760.3] Out: -  Nutritional status:  Diet Order             Diet NPO time specified  Diet effective now                   Neurologic Exam: Awake, stares mostly, but does look around the room to both sides although she favors a right sided gaze. Pupils are equal in ambient light, but she pulls away when attempting penlight exam.  Her eyes are disconjugate-when OS is midline, OD is slightly to right of midline. She stares straight ahead mostly but will turn head to the right as if looking at her sister.  Does not follow any commands. Speech is non comprehensible, with just moaning. She moans louder when she is touched. Face is symmetric at rest. Patient will not participate in strength exam but when stimulated, she withdraws her LEs. Can not elicit DTRs due to her impaired relaxation and pulling away with examiner touching her.   Lab Results: Results for orders placed or performed during the hospital encounter of 09/12/2021 (from the past 48 hour(s))  Comprehensive metabolic panel     Status: Abnormal   Collection Time: 09/18/2021 11:55 AM  Result Value Ref Range   Sodium 135 135 - 145 mmol/L   Potassium 3.5 3.5 -  5.1 mmol/L   Chloride 86 (L) 98 - 111 mmol/L   CO2 38 (H) 22 - 32 mmol/L   Glucose, Bld 107 (H) 70 - 99 mg/dL    Comment: Glucose reference range applies only to samples taken after fasting for at least 8 hours.   BUN 22 8 - 23 mg/dL   Creatinine, Ser 0.95 0.44 - 1.00 mg/dL   Calcium 8.6 (L) 8.9 - 10.3 mg/dL   Total Protein 7.1 6.5 - 8.1 g/dL   Albumin 2.7 (L) 3.5 - 5.0 g/dL   AST 17 15 - 41 U/L   ALT 10 0 - 44 U/L   Alkaline Phosphatase 73 38 - 126 U/L   Total Bilirubin 0.6 0.3 - 1.2 mg/dL   GFR, Estimated >60 >60 mL/min    Comment: (NOTE) Calculated using the CKD-EPI Creatinine Equation (2021)    Anion gap 11 5 - 15    Comment: Performed at [facility], [place] [address] [place], [place] [address]  CBC with Differential     Status: Abnormal   Collection Time: 09/02/2021 11:55 AM  Result Value Ref Range   WBC 2.0 (L) 4.0 - 10.5 K/uL   RBC 3.99 3.87 - 5.11 MIL/uL   Hemoglobin 10.0 (L) 12.0 - 15.0 g/dL   HCT 33.6 (L) 36.0 -  46.0 %   MCV 84.2 80.0 - 100.0 fL   MCH 25.1 (L) 26.0 - 34.0 pg   MCHC 29.8 (L) 30.0 - 36.0 g/dL   RDW 18.3 (H) 11.5 - 15.5 %   Platelets 139 (L) 150 - 400 K/uL   nRBC 0.0 0.0 - 0.2 %   Neutrophils Relative % 73 %   Neutro Abs 1.5 (L) 1.7 - 7.7 K/uL   Lymphocytes Relative 14 %   Lymphs Abs 0.3 (L) 0.7 - 4.0 K/uL   Monocytes Relative 11 %   Monocytes Absolute 0.2 0.1 - 1.0 K/uL   Eosinophils Relative 0 %   Eosinophils Absolute 0.0 0.0 - 0.5 K/uL   Basophils Relative 1 %   Basophils Absolute 0.0 0.0 - 0.1 K/uL   Immature Granulocytes 1 %   Abs Immature Granulocytes 0.01 0.00 - 0.07 K/uL   Reactive, Benign Lymphocytes PRESENT    Polychromasia PRESENT     Comment: Performed at [facility], [place] [address], [place], [place] [address]  Lactic acid, plasma     Status: None   Collection Time: 09/01/2021 11:55 AM  Result Value Ref Range   Lactic Acid, Venous 1.7 0.5 - 1.9 mmol/L    Comment: Performed at [facility], [place] [address], [place], [place] [address]  Ammonia     Status: None   Collection Time: 09/12/2021 11:55 AM  Result Value Ref Range   Ammonia 18 9 - 35 umol/L    Comment: Performed at [facility], [place] [address], [place], [place] [address]  Protime-INR     Status: None   Collection Time: 09/02/2021 11:55 AM  Result Value Ref Range   Prothrombin Time 14.2 11.4 - 15.2 seconds   INR 1.1 0.8 - 1.2    Comment: (NOTE) INR goal varies based on device and disease states. Performed at [facility], [place] [address] [place], [place] [address]   APTT     Status: None   Collection Time: 09/23/2021 11:55 AM  Result Value Ref Range   aPTT 32 24 - 36 seconds    Comment: Performed at [facility], [place] [address], [place], [place] [address]  Blood Culture (routine x 2)     Status: None (Preliminary result)   Collection Time: 09/05/2021 11:55 AM   Specimen: BLOOD RIGHT HAND  Result Value Ref Range   Specimen Description      BLOOD RIGHT HAND Performed at [place] [address], [place], [place] [address]    Special Requests      BOTTLES DRAWN AEROBIC AND ANAEROBIC Blood Culture adequate volume Performed at [place] [address], [place], [place] [address]    Culture      NO GROWTH 2 DAYS Performed at [facility], [place] [address], [place], [place] [address]    Report Status PENDING   Blood Culture (routine x 2)     Status: None (Preliminary result)   Collection Time: 09/03/2021 11:55 AM   Specimen: BLOOD  Result Value Ref Range   Specimen Description      BLOOD LEFT ANTECUBITAL Performed at [facility], [place] [address] [place], [place] [address]    Special Requests      BOTTLES DRAWN AEROBIC AND ANAEROBIC Blood Culture results may not be optimal due to an excessive volume of blood received in culture bottles Performed at [place]  [address], [place], [place] [address]    Culture      NO GROWTH 2 DAYS Performed at [facility], [place] [address] [place], [place] [address]    Report Status PENDING   I-stat chem 8, ED (not at [facility])     Status: Abnormal   Collection Time: 09/22/2021 12:01 PM  Result Value Ref Range   Sodium 135 135 - 145 mmol/L   Potassium 3.5 3.5 - 5.1 mmol/L   Chloride 86 (L) 98 - 111 mmol/L   BUN 20 8 - 23 mg/dL   Creatinine, Ser 0.80 0.44 - 1.00 mg/dL   Glucose, Bld 106 (H) 70 - 99 mg/dL    Comment: Glucose reference range applies only to samples taken after fasting for at least 8 hours.   Calcium, Ion 1.09 (L) 1.15 - 1.40 mmol/L   TCO2 37 (H) 22 - 32 mmol/L   Hemoglobin 10.9 (L) 12.0 - 15.0 g/dL   HCT 32.0 (L) 36.0 - 46.0 %  Resp Panel by RT-PCR (Flu A&B, Covid) Nasopharyngeal Swab     Status: None   Collection Time: 09/10/2021  1:50 PM   Specimen: Nasopharyngeal Swab; Nasopharyngeal(NP) swabs in vial transport medium  Result Value Ref Range   SARS Coronavirus 2 by RT PCR NEGATIVE NEGATIVE    Comment: (NOTE) SARS-CoV-2 target nucleic acids are NOT DETECTED.  The SARS-CoV-2 RNA is generally detectable in upper respiratory specimens during the acute phase of infection. The lowest concentration of SARS-CoV-2 viral copies this assay can detect is 138 copies/mL. A negative result does not preclude SARS-Cov-2 infection and should not be used as the sole basis for treatment or other patient management decisions. A negative result may occur with  improper specimen collection/handling, submission of specimen other than nasopharyngeal swab, presence of viral mutation(s) within the areas targeted by this assay, and inadequate number of viral copies(<138 copies/mL). A negative result must be combined with clinical observations, patient history, and epidemiological information. The expected result is Negative.  Fact Sheet for Patients:   EntrepreneurPulse.com.au  Fact Sheet for Healthcare Providers:  IncredibleEmployment.be  This test is no t yet approved or cleared by the [place] FDA and  has been authorized for detection and/or diagnosis of SARS-CoV-2 by FDA under an Emergency Use Authorization (EUA). This EUA will remain  in effect (meaning this test can be used) for the duration of the COVID-19 declaration under Section 564(b)(1) of the Act, 21 U.S.C.section 360bbb-3(b)(1), unless the authorization is terminated  or revoked sooner.       Influenza A by PCR NEGATIVE NEGATIVE   Influenza B by PCR NEGATIVE NEGATIVE    Comment: (NOTE) The Xpert Xpress SARS-CoV-2/FLU/RSV plus assay is intended as an aid in the diagnosis of influenza from Nasopharyngeal swab specimens and should not be used as a sole basis for treatment. Nasal washings and aspirates are unacceptable for Xpert Xpress SARS-CoV-2/FLU/RSV testing.  Fact Sheet for Patients: EntrepreneurPulse.com.au  Fact Sheet for Healthcare Providers: IncredibleEmployment.be  This test is not yet approved or cleared by the [place] FDA and has been authorized for detection and/or diagnosis of SARS-CoV-2 by FDA under an Emergency Use Authorization (EUA). This EUA will remain in effect (meaning this test can be used) for the duration of the COVID-19 declaration under Section 564(b)(1) of the Act, 21 U.S.C. section 360bbb-3(b)(1), unless the authorization is terminated or revoked.  Performed at [facility], [place] [address], [place], [place] [address]   Urinalysis, Routine w  reflex microscopic Urine, In & Out Cath     Status: Abnormal   Collection Time: 09/22/2021  2:57 PM  Result Value Ref Range   Color, Urine YELLOW YELLOW   APPearance CLEAR CLEAR   Specific Gravity, Urine 1.026 1.005 - 1.030   pH 7.0 5.0 - 8.0   Glucose, UA NEGATIVE NEGATIVE mg/dL   Hgb urine  dipstick NEGATIVE NEGATIVE   Bilirubin Urine NEGATIVE NEGATIVE   Ketones, ur 20 (A) NEGATIVE mg/dL   Protein, ur 30 (A) NEGATIVE mg/dL   Nitrite NEGATIVE NEGATIVE   Leukocytes,Ua NEGATIVE NEGATIVE   RBC / HPF 0-5 0 - 5 RBC/hpf   WBC, UA 0-5 0 - 5 WBC/hpf   Bacteria, UA NONE SEEN NONE SEEN   Squamous Epithelial / LPF 0-5 0 - 5    Comment: Performed at [facility], [place] [address] [place], [place] [address]  Urine rapid drug screen (hosp performed)     Status: Abnormal   Collection Time: 09/18/2021  2:57 PM  Result Value Ref Range   Opiates POSITIVE (A) NONE DETECTED   Cocaine NONE DETECTED NONE DETECTED   Benzodiazepines NONE DETECTED NONE DETECTED   Amphetamines NONE DETECTED NONE DETECTED   Tetrahydrocannabinol NONE DETECTED NONE DETECTED   Barbiturates NONE DETECTED NONE DETECTED    Comment: (NOTE) DRUG SCREEN FOR MEDICAL PURPOSES ONLY.  IF CONFIRMATION IS NEEDED FOR ANY PURPOSE, NOTIFY LAB WITHIN 5 DAYS.  LOWEST DETECTABLE LIMITS FOR URINE DRUG SCREEN Drug Class                     Cutoff (ng/mL) Amphetamine and metabolites    1000 Barbiturate and metabolites    200 Benzodiazepine                 532 Tricyclics and metabolites     300 Opiates and metabolites        300 Cocaine and metabolites        300 THC                            50 Performed at [facility], [place] [address], [place], [place] [address]   Urine Culture     Status: None   Collection Time: 08/28/2021  2:57 PM   Specimen: Urine, Random  Result Value Ref Range   Specimen Description      URINE, RANDOM Performed at [place] [address] [place], [place] [address]    Special Requests      NONE Performed at [facility], [place] [address] [place], [place] [address]    Culture      NO GROWTH Performed at [facility], [place] [address], [place], [place] [address]    Report Status 09/12/2021 FINAL   Blood gas,  arterial     Status: Abnormal   Collection Time: 09/07/2021  5:45 PM  Result Value Ref Range   FIO2 30.00    O2 Content 2.5 L/min   Mode NASAL CANNULA    pH, Arterial 7.444 7.350 - 7.450   pCO2 arterial 54.2 (H) 32.0 - 48.0 mmHg   pO2, Arterial 35.2 (LL) 83.0 - 108.0 mmHg    Comment: CRITICAL RESULT CALLED TO, READ BACK BY AND VERIFIED WITH: KNUCKLES,C. RN AT 1155 09/20/2021 MULLINS,T    Bicarbonate 36.6 (H) 20.0 - 28.0 mmol/L   Acid-Base Excess 11.2 (H) 0.0 - 2.0 mmol/L  O2 Saturation 66.2 %   Patient temperature 98.6    Collection site LEFT RADIAL    Drawn by [id number]    Allens test (pass/fail) PASS PASS    Comment: Performed at [facility], [place] [address], [place], [place] [address]  MRSA Next Gen by PCR, Nasal     Status: None   Collection Time: 09/19/2021  9:13 PM   Specimen: Nasal Mucosa; Nasal Swab  Result Value Ref Range   MRSA by PCR Next Gen NOT DETECTED NOT DETECTED    Comment: (NOTE) The GeneXpert MRSA Assay (FDA approved for NASAL specimens only), is one component of a comprehensive MRSA colonization surveillance program. It is not intended to diagnose MRSA infection nor to guide or monitor treatment for MRSA infections. Test performance is not FDA approved in patients less than 48 years old. Performed at [facility], [place] [address] [place], [place] [address]   Ammonia     Status: None   Collection Time: 09/17/2021 11:02 PM  Result Value Ref Range   Ammonia 31 9 - 35 umol/L    Comment: Performed at [facility], [place] [address] [place], [place] [address]  HIV Antibody (routine testing w rflx)     Status: None   Collection Time: 09/15/2021 11:02 PM  Result Value Ref Range   HIV Screen 4th Generation wRfx Non Reactive Non Reactive    Comment: Performed at [facility], [place] [address] [place], [place] [address]  Protime-INR     Status: None   Collection Time: 09/12/21  2:48 AM  Result Value Ref Range    Prothrombin Time 14.6 11.4 - 15.2 seconds   INR 1.1 0.8 - 1.2    Comment: (NOTE) INR goal varies based on device and disease states. Performed at [facility], [place] [address] [place], [place] [address]   Cortisol-am, blood     Status: None   Collection Time: 09/12/21  2:48 AM  Result Value Ref Range   Cortisol - AM 18.8 6.7 - 22.6 ug/dL    Comment: Performed at [facility], [place] [address] [place], [place] [address]  Comprehensive metabolic panel     Status: Abnormal   Collection Time: 09/12/21  2:48 AM  Result Value Ref Range   Sodium 137 135 - 145 mmol/L   Potassium 3.4 (L) 3.5 - 5.1 mmol/L   Chloride 92 (L) 98 - 111 mmol/L   CO2 33 (H) 22 - 32 mmol/L   Glucose, Bld 84 70 - 99 mg/dL    Comment: Glucose reference range applies only to samples taken after fasting for at least 8 hours.   BUN 15 8 - 23 mg/dL   Creatinine, Ser 0.63 0.44 - 1.00 mg/dL   Calcium 8.3 (L) 8.9 - 10.3 mg/dL   Total Protein 6.2 (L) 6.5 - 8.1 g/dL   Albumin 2.4 (L) 3.5 - 5.0 g/dL   AST 14 (L) 15 - 41 U/L   ALT 10 0 - 44 U/L   Alkaline Phosphatase 72 38 - 126 U/L   Total Bilirubin 0.6 0.3 - 1.2 mg/dL   GFR, Estimated >60 >60 mL/min    Comment: (NOTE) Calculated using the CKD-EPI Creatinine Equation (2021)    Anion gap 12 5 - 15    Comment: Performed at [facility], [place] [address] [place], [place] [address]  CBC     Status: Abnormal   Collection Time: 09/12/21  2:48 AM  Result Value Ref Range   WBC 2.4 (L) 4.0 - 10.5 K/uL   RBC 3.98 3.87 - 5.11 MIL/uL   Hemoglobin 10.0 (L) 12.0 - 15.0 g/dL   HCT 33.8 (L) 36.0 - 46.0 %   MCV 84.9 80.0 - 100.0 fL   MCH 25.1 (L) 26.0 - 34.0 pg   MCHC 29.6 (L) 30.0 - 36.0 g/dL   RDW 18.6 (H) 11.5 - 15.5 %   Platelets 111 (L) 150 - 400 K/uL    Comment: SPECIMEN CHECKED FOR CLOTS Immature Platelet Fraction may be clinically indicated, consider ordering this additional test [id number] REPEATED TO VERIFY PLATELET COUNT  CONFIRMED BY SMEAR    nRBC 0.0 0.0 - 0.2 %    Comment: Performed at [facility], [place] [address], [place], [place] [address]  Procalcitonin     Status: None   Collection Time: 09/12/21  6:46 AM  Result Value Ref Range   Procalcitonin 0.40 ng/mL    Comment:        Interpretation: PCT (Procalcitonin) <= 0.5 ng/mL: Systemic infection (sepsis) is not likely. Local bacterial infection is possible. (NOTE)       Sepsis PCT Algorithm           Lower Respiratory Tract                                      Infection PCT Algorithm    ----------------------------     ----------------------------         PCT < 0.25 ng/mL                PCT < 0.10 ng/mL          Strongly encourage             Strongly discourage   discontinuation of antibiotics    initiation of antibiotics    ----------------------------     -----------------------------       PCT 0.25 - 0.50 ng/mL            PCT 0.10 - 0.25 ng/mL               OR       >80% decrease in PCT            Discourage initiation of                                            antibiotics      Encourage discontinuation           of antibiotics    ----------------------------     -----------------------------         PCT >= 0.50 ng/mL              PCT 0.26 - 0.50 ng/mL               AND        <80% decrease in PCT             Encourage initiation of                                             antibiotics  Encourage continuation           of antibiotics    ----------------------------     -----------------------------        PCT >= 0.50 ng/mL                  PCT > 0.50 ng/mL               AND         increase in PCT                  Strongly encourage                                      initiation of antibiotics    Strongly encourage escalation           of antibiotics                                     -----------------------------                                           PCT <= 0.25 ng/mL                                                  OR                                        > 80% decrease in PCT                                      Discontinue / Do not initiate                                             antibiotics  Performed at [place] [address] [place], [place] [address]   Vitamin B12     Status: None   Collection Time: 09/12/21  8:55 AM  Result Value Ref Range   Vitamin B-12 278 180 - 914 pg/mL    Comment: (NOTE) This assay is not validated for testing neonatal or myeloproliferative syndrome specimens for Vitamin B12 levels. Performed at [facility], [place] [address], [place], [place] [address]   TSH     Status: None   Collection Time: 09/12/21  8:55 AM  Result Value Ref Range   TSH 2.466 0.350 - 4.500 uIU/mL    Comment: Performed by a 3rd Generation assay with a functional sensitivity of <=0.01 uIU/mL. Performed at [facility], [place] [address] [place], [place] [address]   Folate     Status: Abnormal   Collection Time: 09/12/21  8:55 AM  Result Value Ref Range   Folate 4.4 (L) >5.9 ng/mL    Comment: Performed at [facility], [place] [address] [place], [place] [address]  CBC with Differential/Platelet     Status: Abnormal   Collection Time: 09/13/21  2:54 AM  Result Value Ref Range   WBC 2.1 (L) 4.0 - 10.5 K/uL   RBC 3.25 (L) 3.87 - 5.11 MIL/uL   Hemoglobin 8.3 (L) 12.0 - 15.0 g/dL   HCT 28.4 (L) 36.0 - 46.0 %   MCV 87.4 80.0 - 100.0 fL   MCH 25.5 (L) 26.0 - 34.0 pg   MCHC 29.2 (L) 30.0 - 36.0 g/dL   RDW 18.6 (H) 11.5 - 15.5 %   Platelets 129 (L) 150 - 400 K/uL    Comment: Immature Platelet Fraction may be clinically indicated, consider ordering this additional test [id number] REPEATED TO VERIFY    nRBC 0.0 0.0 - 0.2 %   Neutrophils Relative % 68 %   Neutro Abs 1.5 (L) 1.7 - 7.7 K/uL   Lymphocytes Relative 14 %   Lymphs Abs 0.3 (L) 0.7 - 4.0 K/uL   Monocytes Relative 15 %   Monocytes  Absolute 0.3 0.1 - 1.0 K/uL   Eosinophils Relative 1 %   Eosinophils Absolute 0.0 0.0 - 0.5 K/uL   Basophils Relative 1 %   Basophils Absolute 0.0 0.0 - 0.1 K/uL   Immature Granulocytes 1 %   Abs Immature Granulocytes 0.02 0.00 - 0.07 K/uL    Comment: Performed at [facility], [place] [address], [place], [place] [address]    Recent Results (from the past 240 hour(s))  Blood Culture (routine x 2)     Status: None (Preliminary result)   Collection Time: 09/22/2021 11:55 AM   Specimen: BLOOD RIGHT HAND  Result Value Ref Range Status   Specimen Description   Final    BLOOD RIGHT HAND Performed at [place] [address] [place], [place] [address]    Special Requests   Final    BOTTLES DRAWN AEROBIC AND ANAEROBIC Blood Culture adequate volume Performed at [place] [address], [place], [place] [address]    Culture   Final    NO GROWTH 2 DAYS Performed at [place] [address] [place], [place] [address]    Report Status PENDING  Incomplete  Blood Culture (routine x 2)     Status: None (Preliminary result)   Collection Time: 08/27/2021 11:55 AM   Specimen: BLOOD  Result Value Ref Range Status   Specimen Description   Final    BLOOD LEFT ANTECUBITAL Performed at [place] [address] [place], [place] [address]    Special Requests   Final    BOTTLES DRAWN AEROBIC AND ANAEROBIC Blood Culture results may not be optimal due to an excessive volume of blood received in culture bottles Performed at [place] [address] [place], [place] [address]    Culture   Final    NO GROWTH 2 DAYS Performed at [place] [address], [place], [place] [address]    Report Status PENDING  Incomplete  Resp Panel by RT-PCR (Flu A&B, Covid) Nasopharyngeal Swab     Status: None   Collection Time: 09/08/2021  1:50 PM   Specimen: Nasopharyngeal Swab;  Nasopharyngeal(NP) swabs in vial transport medium  Result Value Ref Range Status   SARS Coronavirus 2 by RT PCR NEGATIVE NEGATIVE Final    Comment: (NOTE) SARS-CoV-2 target nucleic acids are NOT DETECTED.  The SARS-CoV-2 RNA is generally detectable  in upper respiratory specimens during the acute phase of infection. The lowest concentration of SARS-CoV-2 viral copies this assay can detect is 138 copies/mL. A negative result does not preclude SARS-Cov-2 infection and should not be used as the sole basis for treatment or other patient management decisions. A negative result may occur with  improper specimen collection/handling, submission of specimen other than nasopharyngeal swab, presence of viral mutation(s) within the areas targeted by this assay, and inadequate number of viral copies(<138 copies/mL). A negative result must be combined with clinical observations, patient history, and epidemiological information. The expected result is Negative.  Fact Sheet for Patients:  EntrepreneurPulse.com.au  Fact Sheet for Healthcare Providers:  IncredibleEmployment.be  This test is no t yet approved or cleared by the [place] FDA and  has been authorized for detection and/or diagnosis of SARS-CoV-2 by FDA under an Emergency Use Authorization (EUA). This EUA will remain  in effect (meaning this test can be used) for the duration of the COVID-19 declaration under Section 564(b)(1) of the Act, 21 U.S.C.section 360bbb-3(b)(1), unless the authorization is terminated  or revoked sooner.       Influenza A by PCR NEGATIVE NEGATIVE Final   Influenza B by PCR NEGATIVE NEGATIVE Final    Comment: (NOTE) The Xpert Xpress SARS-CoV-2/FLU/RSV plus assay is intended as an aid in the diagnosis of influenza from Nasopharyngeal swab specimens and should not be used as a sole basis for treatment. Nasal washings and aspirates are unacceptable for Xpert Xpress  SARS-CoV-2/FLU/RSV testing.  Fact Sheet for Patients: EntrepreneurPulse.com.au  Fact Sheet for Healthcare Providers: IncredibleEmployment.be  This test is not yet approved or cleared by the [place] FDA and has been authorized for detection and/or diagnosis of SARS-CoV-2 by FDA under an Emergency Use Authorization (EUA). This EUA will remain in effect (meaning this test can be used) for the duration of the COVID-19 declaration under Section 564(b)(1) of the Act, 21 U.S.C. section 360bbb-3(b)(1), unless the authorization is terminated or revoked.  Performed at [facility], [place] [address], [place], [place] [address]   Urine Culture     Status: None   Collection Time: 09/17/2021  2:57 PM   Specimen: Urine, Random  Result Value Ref Range Status   Specimen Description   Final    URINE, RANDOM Performed at [place] [address] [place], [place] [address]    Special Requests   Final    NONE Performed at [facility], [place] [address], [place], [place] [address]    Culture   Final    NO GROWTH Performed at [facility], [place] [address] [place], [place] [address]    Report Status 09/12/2021 FINAL  Final  MRSA Next Gen by PCR, Nasal     Status: None   Collection Time: 08/30/2021  9:13 PM   Specimen: Nasal Mucosa; Nasal Swab  Result Value Ref Range Status   MRSA by PCR Next Gen NOT DETECTED NOT DETECTED Final    Comment: (NOTE) The GeneXpert MRSA Assay (FDA approved for NASAL specimens only), is one component of a comprehensive MRSA colonization surveillance program. It is not intended to diagnose MRSA infection nor to guide or monitor treatment for MRSA infections. Test performance is not FDA approved in patients less than 8 years old. Performed at [facility], [place] [address], [place], [place] [address]     Lipid Panel No results for input(s):  CHOL, TRIG, HDL, CHOLHDL, VLDL, LDLCALC in the  last 72 hours.  Studies/Results: CT Head Wo Contrast  Result Date: 09/15/2021 CLINICAL DATA:  Altered mental status EXAM: CT HEAD WITHOUT CONTRAST TECHNIQUE: Contiguous axial images were obtained from the base of the skull through the vertex without intravenous contrast. COMPARISON:  Brain MRI 07/26/2021 FINDINGS: Brain: There is no acute intracranial hemorrhage, extra-axial fluid collection, or acute infarct. Parenchymal volume is normal.  The ventricles are stable in size. There is no mass lesion.  There is no midline shift. Vascular: No hyperdense vessel or unexpected calcification. Skull: Normal. Negative for fracture or focal lesion. Sinuses/Orbits: The imaged paranasal sinuses are clear. The globes and orbits are unremarkable. Other: None. IMPRESSION: No acute intracranial pathology. Electronically Signed   By: Valetta Mole M.D.   On: 09/12/2021 13:47   CT Angio Chest Pulmonary Embolism (PE) W or WO Contrast  Result Date: 09/02/2021 CLINICAL DATA:  Altered mental status.  History of lung cancer. EXAM: CT ANGIOGRAPHY CHEST WITH CONTRAST TECHNIQUE: Multidetector CT imaging of the chest was performed using the standard protocol during bolus administration of intravenous contrast. Multiplanar CT image reconstructions and MIPs were obtained to evaluate the vascular anatomy. CONTRAST:  65m OMNIPAQUE IOHEXOL 350 MG/ML SOLN COMPARISON:  PET-CT dated June 23, 2021. CTA chest dated April 28, 2021. FINDINGS: Cardiovascular: Satisfactory opacification of the pulmonary arteries to the segmental level. No evidence of pulmonary embolism. Unchanged enlarged central pulmonary arteries. Normal heart size. No pericardial effusion. No thoracic aortic aneurysm or dissection. Mediastinum/Nodes: Unchanged large left suprahilar lymph node measuring 3.8 x 2.4 cm (series 4, image 67), previously 3.7 x 2.3 cm. The thyroid gland, trachea, and esophagus demonstrate no significant  findings. Lungs/Pleura: Enlarging anterior left upper lobe mass with similar mediastinal invasion, but progressive destruction of the anterior left second rib. New perilymphatic nodularity in the left upper lobe adjacent to the mass. Unchanged advanced centrilobular emphysema. Trace left pleural effusion. Minimal subsegmental atelectasis in the left greater than right lower lobes. No pneumothorax. Upper Abdomen: No acute abnormality. Musculoskeletal: Progressive destruction of the anterior left second rib. Review of the MIP images confirms the above findings. IMPRESSION: 1. No evidence of pulmonary embolism. 2. 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 New perilymphatic nodularity in the left upper lobe adjacent to the mass, consistent with lymphangitic carcinomatosis. 3. 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 4. Trace left pleural effusion. Electronically Signed   By: WTitus DubinM.D.   On: 09/09/2021 18:32   MR BRAIN WO CONTRAST  Result Date: 08/27/2021 CLINICAL DATA:  Transient ischemic attack.  Altered mental status. EXAM: MRI HEAD WITHOUT CONTRAST TECHNIQUE: Multiplanar, multiecho pulse sequences of the brain and surrounding structures were obtained without intravenous contrast. COMPARISON:  Head CT earlier same day.  MRI 07/26/2021. FINDINGS: Brain: The study suffers from considerable motion degradation due to poor cooperation. Diffusion imaging does not show any acute or subacute infarction. There are minimal small vessel ischemic changes of the cerebral hemispheric white matter. No sign of large vessel infarction. No mass lesion, hemorrhage, hydrocephalus or extra-axial collection. Vascular: Major vessels at the base of the brain show flow. Skull and upper cervical spine: Negative Sinuses/Orbits: Clear/normal Other: None IMPRESSION: Significantly motion degraded exam. No sign of acute or subacute insult.  Mild chronic small-vessel change of the cerebral hemispheric white matter. Electronically Signed   By: MNelson ChimesM.D.   On: 09/04/2021 20:19   CT ABDOMEN PELVIS W CONTRAST  Result Date: 09/15/2021 CLINICAL DATA:  Altered mental status, acute abdominal pain. EXAM: CT ABDOMEN AND PELVIS WITH CONTRAST TECHNIQUE: Multidetector CT imaging of the abdomen and pelvis was performed using the standard protocol following bolus administration of intravenous contrast. CONTRAST:  77m OMNIPAQUE IOHEXOL 350 MG/ML SOLN COMPARISON:  PET 06/23/2021 and CT abdomen pelvis 04/20/2021. FINDINGS: Lower chest: Image quality in the lung bases is degraded by respiratory motion, limiting diagnostic quality. Heart size normal. No pericardial or pleural effusion. Distal esophagus is unremarkable. Hepatobiliary: Subcentimeter low-attenuation lesion in the right hepatic lobe is too small to characterize. Liver and gallbladder are otherwise unremarkable. No biliary ductal dilatation. Pancreas: Negative. Spleen: Negative. Adrenals/Urinary Tract: Adrenal glands are unremarkable. Low-attenuation lesion in the right kidney measures 1.4 cm, too small to characterize but likely a cyst, possibly minimally complex with a peripheral calcification. Kidneys are otherwise unremarkable. Ureters are decompressed. Bilateral hip arthroplasties create streak artifact, obscuring the majority of the bladder. Stomach/Bowel: Stomach, small bowel and colon are unremarkable. Appendix is not definitely visualized. Vascular/Lymphatic: Atherosclerotic calcification of the aorta. No pathologically enlarged lymph nodes. Reproductive: Uterus is visualized.  No adnexal mass. Other: No free fluid.  Mesenteries and peritoneum are unremarkable. Musculoskeletal: Bilateral hip arthroplasties. Degenerative changes in the spine. IMPRESSION: 1. No acute findings to explain the patient's abdominal pain. 2.  Aortic atherosclerosis (ICD10-I70.0). Electronically Signed   By:  MLorin PicketM.D.   On: 09/13/2021 14:06   DG Chest Port 1 View  Result Date: 09/13/2021 CLINICAL DATA:  Altered mental status in a patient with history of pulmonary neoplasm. EXAM: PORTABLE CHEST 1 VIEW COMPARISON:  Comparison made with prior imaging studies from August of 2022. FINDINGS: EKG leads project over the chest. Trachea is midline. Cardiomediastinal contours and hilar structures are stable with fullness of the LEFT hilum and increased density about the LEFT upper chest compatible with known pulmonary neoplasm. No sign of lobar consolidation or visible pneumothorax. On limited assessment there is no acute skeletal process. Rib destruction of LEFT anterior ribs not as well demonstrated as on prior CT imaging. IMPRESSION: Stable findings of neoplasm in the LEFT chest. No acute cardiopulmonary disease. Electronically Signed   By: GZetta BillsM.D.   On: 08/31/2021 11:43   EEG adult  Result Date: 09/12/2021 YLora Havens MD     09/12/2021  8:19 PM Patient Name: SKHRISTY KALANMRN: [id number] Attending: PLora HavensReferring Physician/Provider: Dr TCherylann RatelDate: 09/12/2021 Duration: 26.41 mins Patient history: 65year old female with altered mental status.  EEG to evaluate for seizure. Level of alertness: Awake AEDs during EEG study: None Technical aspects: This EEG study was done with scalp electrodes positioned according to the 10-20 International system of electrode placement. Electrical activity was acquired at a sampling rate of _0  and reviewed with a high frequency filter of _1  and a low frequency filter of _2 . EEG data were recorded continuously and digitally stored. Description: No posterior dominant rhythm was seen. EEG showed continuous generalized sharply contoured 3 to 6 Hz theta-delta slowing, at times with triphasic morphology. Hyperventilation and photic stimulation were not performed.   ABNORMALITY - Continuous slow, generalized -Triphasic waves,  generalized IMPRESSION: This study is suggestive of moderate diffuse encephalopathy, nonspecific etiology but could be secondary to toxic-metabolic causes, cefepime toxicity. No seizures or definite epileptiform discharges were seen throughout the recording. Priyanka OBarbra Sarks   Medications: I have reviewed the patient's current medications.  Current Facility-Administered Medications:    0.9 %  sodium chloride infusion, 250 mL, Intravenous, Continuous, MLegrand Como  Dinkels, MD, Last Rate: 10 mL/hr at 09/13/21 0235, Restarted at 09/13/21 0235   acetaminophen (TYLENOL) tablet 650 mg, 650 mg, Oral, Q6H PRN **OR** acetaminophen (TYLENOL) suppository 650 mg, 650 mg, Rectal, Q6H PRN, Marylyn Ishihara, Tyrone A, DO, 650 mg at 09/13/21 1811   acyclovir (ZOVIRAX) 745 mg in dextrose 5 % 150 mL IVPB, 10 mg/kg, Intravenous, Q8H, Poindexter, Leann T, RPH, Stopped at 09/13/21 1451   [COMPLETED] amiodarone (NEXTERONE) 1.8 mg/mL load via infusion 150 mg, 150 mg, Intravenous, Once, 150 mg at 09/13/21 1805 **FOLLOWED BY** amiodarone (NEXTERONE PREMIX) 360-4.14 MG/200ML-% (1.8 mg/mL) IV infusion, 60 mg/hr, Intravenous, Continuous, Last Rate: 33.3 mL/hr at 09/13/21 1846, 60 mg/hr at 09/13/21 1846 **FOLLOWED BY** [START ON 09/14/2021] amiodarone (NEXTERONE PREMIX) 360-4.14 MG/200ML-% (1.8 mg/mL) IV infusion, 30 mg/hr, Intravenous, Continuous, Gherghe, Costin M, MD   ampicillin (OMNIPEN) 2 g in sodium chloride 0.9 % 100 mL IVPB, 2 g, Intravenous, Q4H, Poindexter, Leann T, RPH, Stopped at 09/13/21 1708   cefTRIAXone (ROCEPHIN) 2 g in sodium chloride 0.9 % 100 mL IVPB, 2 g, Intravenous, Q12H, Byrum, Rose Fillers, MD, Stopped at 09/13/21 1040   chlorhexidine (PERIDEX) 0.12 % solution 15 mL, 15 mL, Mouth Rinse, BID, Kyle, Tyrone A, DO   Chlorhexidine Gluconate Cloth 2 % PADS 6 each, 6 each, Topical, Daily, Kyle, Tyrone A, DO, 6 each at 09/12/21 2142   [COMPLETED] diltiazem (CARDIZEM) 1 mg/mL load via infusion 10 mg, 10 mg, Intravenous, Once, 10 mg  at 09/13/21 1738 **AND** diltiazem (CARDIZEM) 125 mg in dextrose 5% 125 mL (1 mg/mL) infusion, 5-15 mg/hr, Intravenous, Continuous, Gherghe, Vella Redhead, MD, Stopped at 09/13/21 1817   heparin ADULT infusion 100 units/mL (25000 units/250m), 1,100 Units/hr, Intravenous, Continuous, TDimple Nanas RPH, Last Rate: 11 mL/hr at 09/13/21 1846, 1,100 Units/hr at 09/13/21 1846   MEDLINE mouth rinse, 15 mL, Mouth Rinse, q12n4p, Kyle, Tyrone A, DO   ondansetron (ZOFRAN) tablet 4 mg, 4 mg, Oral, Q6H PRN **OR** ondansetron (ZOFRAN) injection 4 mg, 4 mg, Intravenous, Q6H PRN, Kyle, Tyrone A, DO   [START ON 09/14/2021] vancomycin (VANCOCIN) IVPB 1000 mg/200 mL premix, 1,000 mg, Intravenous, Q12H, Gherghe, CVella Redhead MD   Assessment/Plan: 65y.o. female who presented to the ED 2 days ago for evaluation of altered mental status. She has had progressive decline since Monday and is now without intelligible speech.  - Examination today (Wednesday) reveals patient that is awake and looking around the room. No intelligible speech. She appears to have allodynia, making unintelligible expressions suggestive of pain when being touched. She does not follow commands but withdraws when being touched. She seems to be tracking examiner slightly better today than previously charted. -Imaging: - CTH on arrival is without acute intracranial pathology,  - MRI brain is severely motion limited but within that limitation, there is no acute or subacute insult identified.  - Initial work up revealed patient who met SIRS criteria with unknown infectious source s/p empiric antibiotic coverage. Procalcitonin of 40 in the setting of lung adenoma on chemoradiation, tmax of 100.2 degrees, with leukopenia. Patient was also tachycardic with tachypnea with pO2 of 35.2 and pCO2 of 54.2 on admission. TSH and UA are unremarkable and B12 is slightly low for neurologic standards. There has been no identified source of infection. - Presentation is felt  to be most consistent with toxic metabolic versus infectious encephalopathy with SIRS criteria met, but with unidentified source. She has been initiated on empiric antibiotics. It is possible that Ms. TLemmealso has a component of  delirium versus progressive debilitation encephalopathy with chronic illnesses including chronic hypoxemia, progressive bronchogenic carcinoma in the setting of chemoradiation with new lymphangitis carcinomatosis and leukopenia, elevated temperature and heart rate, and day/night confusion.  - Routine EEG was suggestive of moderate diffuse encephalopathy of non-specific etiology. No seizures or definite epileptiform discharges were seen.  - In addition to the above, DDx also includes meningitis or an MRI-negative viral encephalitis.  -Family does not want to take the risk of intubation and sedation, which would be required for LP as the patient becomes agitated and appears to be in pain with any repositioning as well as during motor and sensory exam. Empiric meningitis-dose antibiotics were recommended.  - Due to possibility of Cefepime toxicity, this was changed to Ceftriaxone.  - Agree with vancomycin - Also on empiric acyclovir and ampicillin   Recommendations: - Continue B12 supplementation. On discharge, continue with 2000 mcg po qd x 3 months, then draw another level (goal over 500).  - Continue current regimen for empiric treatment of meningitis/encephalitis. Family does not want LP.  - Delirium precautions.  - Avoid medications on the Beers List for the elderly.  - Neurohospitalist service will follow on a PRN basis. Please call if there are additional questions.   LOS: 2 days   _0  signed: Dr. Kerney Elbe 09/13/2021  9:52 AM

## 2021-09-13 NOTE — Progress Notes (Addendum)
Called at bedside this afternoon for elevated heart rate into the 180s.  She had a bath and then she was repositioned in bed when this happened.  On my evaluation she remains confused, aphasic, sitting up and is alert.  Stat twelve-lead EKG was obtained which showed possible SVT.  3 mg of adenosine were pushed x2 without much effect, and then 6 mg of adenosine were pushed briefly revealing flutter waves.  She does not have a history of A. fib/flutter.  She is also febrile with a rectal temp of 101.7.  Given flutter findings started diltiazem infusion.  Obtain a 2D echo, cardiology consult in the morning, discussed with Dr. Irish Lack.  This is probably in the setting of her acute febrile illness as detailed in today's progress note.  [name] with critical care was also aware and he was at bedside as well  Spoke with Dr Acie Fredrickson with cardiology who will see her in consultation as well. Start anticoagulation with heparin, d/w family, no prior history of life threatening bleeding and currently there is no clinical suspicion of bleeding. She is slightly pancytopenic.  The patient is critically ill with multiple organ system failure and requires high complexity decision making for assessment and support, frequent evaluation and titration of therapies, advanced monitoring, review of radiographic studies and interpretation of complex data.   Update 6 pm, she is on diltiazem drip without any effect on her heart rate, remains at 170, blood pressure is now on the low side in the 90s. Will switch to Amiodarone  Critical Care Time devoted to patient care services, exclusive of separately billable procedures, described in this note is 35 minutes. 5 pm - 5:35 pm  Carmon Brigandi M. Cruzita Lederer, MD, PhD [facility]  Between 7 am - 7 pm you can contact me via Amion (for emergencies) or [place] (non urgent matters).  I am not available 7 pm - 7 am, please contact night coverage MD/APP via Amion

## 2021-09-14 ENCOUNTER — Inpatient Hospital Stay (HOSPITAL_COMMUNITY): Payer: 59

## 2021-09-14 ENCOUNTER — Encounter (HOSPITAL_COMMUNITY): Payer: Self-pay | Admitting: Internal Medicine

## 2021-09-14 ENCOUNTER — Ambulatory Visit
Admission: RE | Admit: 2021-09-14 | Discharge: 2021-09-14 | Disposition: A | Discharge status: Home / Self Care | Payer: 59 | Source: Ambulatory Visit | Attending: Radiation Oncology | Admitting: Radiation Oncology

## 2021-09-14 ENCOUNTER — Other Ambulatory Visit: Payer: Self-pay

## 2021-09-14 DIAGNOSIS — I480 Paroxysmal atrial fibrillation: Secondary | ICD-10-CM | POA: Diagnosis not present

## 2021-09-14 DIAGNOSIS — J9611 Chronic respiratory failure with hypoxia: Secondary | ICD-10-CM | POA: Diagnosis not present

## 2021-09-14 DIAGNOSIS — G9341 Metabolic encephalopathy: Secondary | ICD-10-CM | POA: Insufficient documentation

## 2021-09-14 DIAGNOSIS — J449 Chronic obstructive pulmonary disease, unspecified: Secondary | ICD-10-CM | POA: Diagnosis not present

## 2021-09-14 DIAGNOSIS — R4182 Altered mental status, unspecified: Secondary | ICD-10-CM | POA: Diagnosis not present

## 2021-09-14 DIAGNOSIS — R410 Disorientation, unspecified: Secondary | ICD-10-CM | POA: Diagnosis not present

## 2021-09-14 DIAGNOSIS — R6510 Systemic inflammatory response syndrome (SIRS) of non-infectious origin without acute organ dysfunction: Secondary | ICD-10-CM | POA: Diagnosis not present

## 2021-09-14 DIAGNOSIS — R9431 Abnormal electrocardiogram [ECG] [EKG]: Secondary | ICD-10-CM

## 2021-09-14 LAB — CBC
HCT: 26.9 % — ABNORMAL LOW (ref 36.0–46.0)
Hemoglobin: 7.8 g/dL — ABNORMAL LOW (ref 12.0–15.0)
MCH: 25.1 pg — ABNORMAL LOW (ref 26.0–34.0)
MCHC: 29 g/dL — ABNORMAL LOW (ref 30.0–36.0)
MCV: 86.5 fL (ref 80.0–100.0)
Platelets: 133 10*3/uL — ABNORMAL LOW (ref 150–400)
RBC: 3.11 MIL/uL — ABNORMAL LOW (ref 3.87–5.11)
RDW: 18.6 % — ABNORMAL HIGH (ref 11.5–15.5)
WBC: 2.9 10*3/uL — ABNORMAL LOW (ref 4.0–10.5)
nRBC: 0 % (ref 0.0–0.2)

## 2021-09-14 LAB — COMPREHENSIVE METABOLIC PANEL
ALT: 9 U/L (ref 0–44)
AST: 14 U/L — ABNORMAL LOW (ref 15–41)
Albumin: 2.2 g/dL — ABNORMAL LOW (ref 3.5–5.0)
Alkaline Phosphatase: 62 U/L (ref 38–126)
Anion gap: 8 (ref 5–15)
BUN: 14 mg/dL (ref 8–23)
CO2: 34 mmol/L — ABNORMAL HIGH (ref 22–32)
Calcium: 7.4 mg/dL — ABNORMAL LOW (ref 8.9–10.3)
Chloride: 97 mmol/L — ABNORMAL LOW (ref 98–111)
Creatinine, Ser: 0.74 mg/dL (ref 0.44–1.00)
GFR, Estimated: >60 mL/min (ref >60)
Glucose, Bld: 112 mg/dL — ABNORMAL HIGH (ref 70–99)
Potassium: 3.3 mmol/L — ABNORMAL LOW (ref 3.5–5.1)
Sodium: 139 mmol/L (ref 135–145)
Total Bilirubin: 0.5 mg/dL (ref 0.3–1.2)
Total Protein: 5.5 g/dL — ABNORMAL LOW (ref 6.5–8.1)

## 2021-09-14 LAB — ECHOCARDIOGRAM COMPLETE
Area-P 1/2: 5.13 cm2
Calc EF: 57.9 %
MV VTI: 3.38 cm2
S' Lateral: 3.4 cm
Single Plane A2C EF: 60 %
Single Plane A4C EF: 58.3 %

## 2021-09-14 LAB — BASIC METABOLIC PANEL
Anion gap: 11 (ref 5–15)
BUN: 10 mg/dL (ref 8–23)
CO2: 30 mmol/L (ref 22–32)
Calcium: 7.9 mg/dL — ABNORMAL LOW (ref 8.9–10.3)
Chloride: 94 mmol/L — ABNORMAL LOW (ref 98–111)
Creatinine, Ser: 0.57 mg/dL (ref 0.44–1.00)
GFR, Estimated: >60 mL/min (ref >60)
Glucose, Bld: 109 mg/dL — ABNORMAL HIGH (ref 70–99)
Potassium: 3 mmol/L — ABNORMAL LOW (ref 3.5–5.1)
Sodium: 135 mmol/L (ref 135–145)

## 2021-09-14 LAB — MAGNESIUM
Magnesium: 1.2 mg/dL — ABNORMAL LOW (ref 1.7–2.4)
Magnesium: 1.9 mg/dL (ref 1.7–2.4)

## 2021-09-14 LAB — HEPARIN LEVEL (UNFRACTIONATED)
Heparin Unfractionated: 0.2 IU/mL — ABNORMAL LOW (ref 0.30–0.70)
Heparin Unfractionated: 0.26 IU/mL — ABNORMAL LOW (ref 0.30–0.70)
Heparin Unfractionated: 0.35 IU/mL (ref 0.30–0.70)

## 2021-09-14 LAB — RPR: RPR Ser Ql: NONREACTIVE

## 2021-09-14 MED ORDER — TRAMADOL HCL 50 MG PO TABS
50.0000 mg | ORAL_TABLET | Freq: Once | ORAL | Status: AC
  Administered 2021-09-15: 50 mg via ORAL
  Filled 2021-09-14: qty 1

## 2021-09-14 MED ORDER — POTASSIUM CHLORIDE 10 MEQ/100ML IV SOLN
10.0000 meq | INTRAVENOUS | Status: AC
  Administered 2021-09-14 (×6): 10 meq via INTRAVENOUS
  Filled 2021-09-14 (×6): qty 100

## 2021-09-14 MED ORDER — MAGNESIUM SULFATE 4 GM/100ML IV SOLN
4.0000 g | Freq: Once | INTRAVENOUS | Status: AC
  Administered 2021-09-14: 4 g via INTRAVENOUS
  Filled 2021-09-14: qty 100

## 2021-09-14 MED ORDER — MAGNESIUM SULFATE 2 GM/50ML IV SOLN
2.0000 g | Freq: Once | INTRAVENOUS | Status: AC
  Administered 2021-09-14: 2 g via INTRAVENOUS
  Filled 2021-09-14: qty 50

## 2021-09-14 MED ORDER — HEPARIN (PORCINE) 25000 UT/250ML-% IV SOLN
1400.0000 [IU]/h | INTRAVENOUS | Status: DC
  Administered 2021-09-14 – 2021-09-19 (×9): 1400 [IU]/h via INTRAVENOUS
  Filled 2021-09-14 (×8): qty 250

## 2021-09-14 MED ORDER — POTASSIUM CHLORIDE 10 MEQ/100ML IV SOLN
10.0000 meq | INTRAVENOUS | Status: AC
Start: 2021-09-14 — End: 2021-09-15
  Administered 2021-09-15 (×4): 10 meq via INTRAVENOUS
  Filled 2021-09-14: qty 100

## 2021-09-14 MED ORDER — MAGNESIUM SULFATE 2 GM/50ML IV SOLN
2.0000 g | Freq: Once | INTRAVENOUS | Status: AC
  Administered 2021-09-15: 2 g via INTRAVENOUS
  Filled 2021-09-14: qty 50

## 2021-09-14 NOTE — Progress Notes (Signed)
K+ 3.3, Mg 1.2 Replaced per protocol

## 2021-09-14 NOTE — Plan of Care (Signed)

## 2021-09-14 NOTE — Progress Notes (Signed)
ANTICOAGULATION CONSULT NOTE - follow up  Pharmacy Consult for heparin Indication: atrial fibrillation  Allergies  Allergen Reactions   Dyazide [Hydrochlorothiazide W-Triamterene]     hives   Zestoretic [Lisinopril-Hydrochlorothiazide]     Swelling     Patient Measurements:   Heparin Dosing Weight: 74.6 kg  Vital Signs: Temp: 97.7 F (36.5 C) (10/19 2330) Temp Source: Axillary (10/19 2330) BP: 130/50 (10/20 0045) Pulse Rate: 109 (10/20 0045)  Labs: Recent Labs    09/08/2021 1155 08/26/2021 1201 09/12/21 0248 09/13/21 0254 09/14/21 0004  HGB 10.0* 10.9* 10.0* 8.3* 7.8*  HCT 33.6* 32.0* 33.8* 28.4* 26.9*  PLT 139*  --  111* 129* 133*  APTT 32  --   --   --   --   LABPROT 14.2  --  14.6  --   --   INR 1.1  --  1.1  --   --   HEPARINUNFRC  --   --   --   --  0.20*  CREATININE 0.95 0.80 0.63  --  0.74     Estimated Creatinine Clearance: 77.8 mL/min (by C-G formula based on SCr of 0.74 mg/dL).   Medical History: Past Medical History:  Diagnosis Date   Angioedema    COPD (chronic obstructive pulmonary disease) (HCC)    FHx: migraine headaches    Hyperlipidemia    Hypertension    Seasonal allergies     Medications:  Scheduled:   chlorhexidine  15 mL Mouth Rinse BID   Chlorhexidine Gluconate Cloth  6 each Topical Daily   mouth rinse  15 mL Mouth Rinse q12n4p   Infusions:   sodium chloride 10 mL/hr at 09/13/21 0235   sodium chloride 10 mL/hr at 09/13/21 2358   acyclovir (ZOVIRAX) [phone] mg IVPB Stopped (09/14/21 0018)   amiodarone 30 mg/hr (09/14/21 0100)   ampicillin (OMNIPEN) IV 2 g (09/14/21 0100)   cefTRIAXone (ROCEPHIN)  IV Stopped (09/13/21 2243)   diltiazem (CARDIZEM) infusion Stopped (09/13/21 1817)   heparin 1,100 Units/hr (09/13/21 2200)   magnesium sulfate bolus IVPB     Followed by   magnesium sulfate bolus IVPB     potassium chloride     vancomycin 1,000 mg (09/13/21 2359)    Assessment: 65 yo female with new atrial  fibrillation/flutter.  No history of anticoagulation PTA and none received this admit.  Patient case discussed with cardiology who recommended starting patient on heparin IV.  Baseline aPTT wnl at 32 seconds and INR 1.1.  Patient is pancytopenic likely due to chemo/radiation therapy for lung adenocarcinoma.  Pharmacy consulted to dose heparin.    Today, 09/14/21 HL 0.20 subtherapeutic on 1100 units/hr -Hgb down from 10 >7.8, plts up to 133 from 111 -No s/sx bleeding per RN  Goal of Therapy:  Heparin level 0.3-0.7 units/ml Monitor platelets by anticoagulation protocol: Yes   Plan:  Increase heparin drip to 1250 units/hr Heparin level in 6 hours Daily CBC Monitor for s/sx bleeding F/u ability to transition to oral anticoagulant  Dolly Rias RPh 09/14/2021, 1:31 AM

## 2021-09-14 NOTE — Progress Notes (Signed)
NAME:  Patricia Sampson, MRN:  [id number], DOB:  Aug 29, 1956, LOS: 3 ADMISSION DATE:  09/22/2021, CONSULTATION DATE: 08/28/2021 REFERRING MD: Dr. Marylyn Ishihara, CHIEF COMPLAINT: Encephalopathy  History of Present Illness:  65 year old woman with very severe COPD, associated chronic hypoxemic respiratory failure (4-8 L/min), hypertension, hyperlipidemia.  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 She was noted to have altered mental status on the morning of 10/17, appeared to be normal when she went to bed the night before.  In the emergency department she was tachycardic, tachypneic with pancytopenia.  Last chemotherapy infusion appears to have been 09/05/2021.  Acute metabolic encephalopathy.  She was treated empirically for possible sepsis, source unknown. Home med list with Percocet, Neurontin.  Her husband notes that he does not believe she is been taking any of her medicines for the last 2 to 3 days.   Pertinent  Medical History   Past Medical History:  Diagnosis Date   Angioedema    COPD (chronic obstructive pulmonary disease) (HCC)    FHx: migraine headaches    Hyperlipidemia    Hypertension    Seasonal allergies     Significant Hospital Events: Including procedures, antibiotic start and stop dates in addition to other pertinent events   CT abdomen pelvis 10/17 >> no acute findings Head CT 10/17 >> normal CT-PA chest 10/17 >> no new infiltrates or significant change in the left upper lobe mass.  No PE MRI brain 10/17 >> Motion degradation compromise interpretation, no acute or subacute infarct, no mass lesion EEG 10/18 with generalized slowing, no evidence of active seizures or epileptiform activity 10/18 acyclovir and ampicillin added to cover possible meningitis 10/19 SVT, likely A. fib/flutter noted after adenosine given.  Hypotension with diltiazem, then started amiodarone.  Back in sinus  tachycardia  Interim History / Subjective:   SVT with what looks like A. fib/flutter after adenosine given early evening 10/19.  No real response to diltiazem infusion, then given amiodarone.  Back in sinus tachycardia Patient continued to be aphasic and to have agitation through the day 10/19.  Now off Precedex I/O positive 8.7 L total Has had hypokalemia, replaced WBC increasing, 2.9 this morning 10/20   Objective   Blood pressure (!) 111/55, pulse (!) 102, temperature 98.3 F (36.8 C), temperature source Axillary, resp. rate (!) 25, SpO2 96 %.        Intake/Output Summary (Last 24 hours) at 09/14/2021 0808 Last data filed at 09/14/2021 0409 Gross per 24 hour  Intake 3456.35 ml  Output 300 ml  Net 3156.35 ml   There were no vitals filed for this visit.  Examination: General: Laying comfortably in bed HENT: Pupils equal, oropharynx clear Lungs: Distant breath sounds, decreased to both bases, no wheezing clinical Cardiovascular: Tachycardic, regular, 105 Abdomen: Nondistended, positive bowel sounds Extremities: No edema Neuro: Markedly improved.  She is awake with eyes open, is able to speak and interact.  Followed commands.  Still oriented only to self, not place or situation  Resolved Hospital Problem list     Assessment & Plan:  Acute encephalopathy, appreciate neurology assistance with the case.  Some evolving evidence for an agitated delirium.  Still considering a toxic metabolic cause, no clear source of infection but consider meningitis.  LP deferred due to associated risk of performing.  No evidence for CVA, no evidence for active seizures or recent seizure.  Vitamin B12 borderline low.  She is on gabapentin at home  but unlikely for this to represent withdrawal. -Agree with continuing meningitis coverage antibiotics including the acyclovir -Precedex discontinued 10/19 -Careful with sedating medications -Ensure adequate oxygenation -B12 supplementation   Acute on  chronic hypoxemic respiratory failure.  She has severe COPD, is on 6-8 L/min at baseline. Severe COPD -Continue O2, goal SPO2 > 90%. -Based on her chronic respiratory failure she would do poorly if she were to require mechanical ventilation.  She is not appear to be at risk for this at this time.  Broached the subject with her husband at presentation.  They are considering goals of care, would like for her to be able to participate when her neurological status will allow.  She is full code at this time.  SVT 10/19.  Adenosine was given and look like A. fib/flutter -Back in sinus tachycardia evening 10/19 -Ensure electrolytes are replaced -Appreciate cardiology evaluation.  Reviewed the case with Dr. Acie Fredrickson today.  She is at some risk for going back into A. fib given her underlying chronic illnesses.  We will have to weigh the risk and benefits (along with oncology) of long-range anticoagulation.  She does not have any history of hemoptysis but she does have chronic anemia  Adenocarcinoma of the lung, on chemoradiation.   Pancytopenia on presentation without any clear evidence for infection or evidence for bleeding. -Continue to follow CBC   Best Practice (right click and "Reselect all SmartList Selections" daily)   Diet/type: Regular consistency (see orders) DVT prophylaxis: other GI prophylaxis: N/A Lines: N/A Foley:  N/A Code Status:  full code Last date of multidisciplinary goals of care discussion [10/17. Discussed GOC briefly with pt's husband 10/17 -- need further discussions and would like to include pt, her son. She is full code at this time.] Family: Updated son at bedside on 10/20  Labs   CBC: Recent Labs  Lab 09/15/2021 1155 08/28/2021 1201 09/12/21 0248 09/13/21 0254 09/14/21 0004  WBC 2.0*  --  2.4* 2.1* 2.9*  NEUTROABS 1.5*  --   --  1.5*  --   HGB 10.0* 10.9* 10.0* 8.3* 7.8*  HCT 33.6* 32.0* 33.8* 28.4* 26.9*  MCV 84.2  --  84.9 87.4 86.5  PLT 139*  --  111* 129*  133*    Basic Metabolic Panel: Recent Labs  Lab 08/28/2021 1155 09/07/2021 1201 09/12/21 0248 09/14/21 0004  NA 135 135 137 139  K 3.5 3.5 3.4* 3.3*  CL 86* 86* 92* 97*  CO2 38*  --  33* 34*  GLUCOSE 107* 106* 84 112*  BUN _0 CREATININE 0.95 0.80 0.63 0.74  CALCIUM 8.6*  --  8.3* 7.4*  MG  --   --   --  1.2*   GFR: Estimated Creatinine Clearance: 77.8 mL/min (by C-G formula based on SCr of 0.74 mg/dL). Recent Labs  Lab 08/30/2021 1155 09/12/21 0248 09/12/21 0646 09/13/21 0254 09/14/21 0004  PROCALCITON  --   --  0.40  --   --   WBC 2.0* 2.4*  --  2.1* 2.9*  LATICACIDVEN 1.7  --   --   --   --     Liver Function Tests: Recent Labs  Lab 09/14/2021 1155 09/12/21 0248 09/14/21 0004  AST 17 14* 14*  ALT _1 ALKPHOS 73 72 62  BILITOT 0.6 0.6 0.5  PROT 7.1 6.2* 5.5*  ALBUMIN 2.7* 2.4* 2.2*   No results for input(s): LIPASE, AMYLASE in the last 168 hours. Recent Labs  Lab 08/27/2021  1155 09/02/2021 2302  AMMONIA 18 31    ABG    Component Value Date/Time   PHART 7.444 09/09/2021 1745   PCO2ART 54.2 (H) 09/13/2021 1745   PO2ART 35.2 (LL) 08/29/2021 1745   HCO3 36.6 (H) 09/10/2021 1745   TCO2 37 (H) 09/21/2021 1201   O2SAT 66.2 09/15/2021 1745     Coagulation Profile: Recent Labs  Lab 08/29/2021 1155 09/12/21 0248  INR 1.1 1.1    Cardiac Enzymes: No results for input(s): CKTOTAL, CKMB, CKMBINDEX, TROPONINI in the last 168 hours.  HbA1C: No results found for: HGBA1C  CBG: No results for input(s): GLUCAP in the last 168 hours.   Critical care time: NA      Baltazar Apo, MD, PhD 09/14/2021, 8:08 AM [facility] [phone] or if no answer before 7:00PM call [phone] For any issues after 7:00PM please call eLink [phone]

## 2021-09-14 NOTE — Progress Notes (Signed)
Progress Note    Patricia Sampson  OFB:[id number] DOB: 10/04/1956  DOA: 09/16/2021 PCP: Greig Right, MD      Brief Narrative:    Medical records reviewed and are as summarized below:  Patricia Sampson is a 65 y.o. female with medical history significant for severe COPD, chronic hypoxemic respiratory failure 4 to 8 L/min oxygen, angioedema, hypertension, hyperlipidemia, recent diagnosis of stage III adenocarcinoma of the left lung with chest wall invasion (in August 2020) undergoing chemoradiation therapy.  She presented to the hospital with altered mental status.  She was admitted to the hospital for SIRS, acute metabolic encephalopathy and pancytopenia.  It is suspected that patient probably has underlying infection however no source of infection has been identified thus far.  She was treated with empiric IV antibiotics and IV fluids.  She developed atrial flutter/atrial fibrillation with RVR and she was treated with IV heparin infusion and IV amiodarone infusion.      Assessment/Plan:   Principal Problem:   SIRS (systemic inflammatory response syndrome) (HCC) Active Problems:   COPD (chronic obstructive pulmonary disease) (HCC)   Chronic respiratory failure with hypoxia (HCC)    SIRS, suspected infection of unclear etiology: Continue empiric IV antibiotics.  Follow-up blood cultures.  Patient declined lumbar puncture.  Acute metabolic encephalopathy: Slowly improving.  Continue supportive care  Paroxysmal atrial fibrillation/atrial flutter with RVR: Continue IV amiodarone and IV heparin drip.  Monitor heparin level per protocol.  Appreciate input from cardiologist.  Hypokalemia and hypomagnesemia: Replete potassium and magnesium and monitor levels.  Stage IIIa lung cancer: Outpatient follow-up with oncologist.  Pancytopenia: Monitor CBC.  No indication for blood or platelet transfusion.  Severe COPD with chronic hypoxemic respiratory failure: He is  bronchodilators as needed.  Continue oxygen via nasal cannula.   Diet Order             Diet NPO time specified  Diet effective now                      Consultants: Cardiologist, intensivist  Procedures: None    Medications:    chlorhexidine  15 mL Mouth Rinse BID   Chlorhexidine Gluconate Cloth  6 each Topical Daily   mouth rinse  15 mL Mouth Rinse q12n4p   Continuous Infusions:  sodium chloride 10 mL/hr at 09/13/21 0235   sodium chloride Stopped (09/14/21 0200)   acyclovir (ZOVIRAX) 614 008 8059 mg IVPB Stopped (09/14/21 0626)   amiodarone 30 mg/hr (09/14/21 0525)   ampicillin (OMNIPEN) IV 2 g (09/14/21 0927)   cefTRIAXone (ROCEPHIN)  IV Stopped (09/13/21 2243)   diltiazem (CARDIZEM) infusion Stopped (09/13/21 1817)   heparin 1,250 Units/hr (09/14/21 0409)   potassium chloride 10 mEq (09/14/21 0922)   vancomycin Stopped (09/14/21 0059)     Anti-infectives (From admission, onward)    Start     Dose/Rate Route Frequency Ordered Stop   09/14/21 0000  vancomycin (VANCOCIN) IVPB 1000 mg/200 mL premix        1,000 mg 200 mL/hr over 60 Minutes Intravenous Every 12 hours 09/13/21 1241     09/13/21 1000  cefTRIAXone (ROCEPHIN) 2 g in sodium chloride 0.9 % 100 mL IVPB        2 g 200 mL/hr over 30 Minutes Intravenous Every 12 hours 09/13/21 0848     09/12/21 2200  acyclovir (ZOVIRAX) 745 mg in dextrose 5 % 150 mL IVPB        10 mg/kg  74.6 kg 164.9 mL/hr  over 60 Minutes Intravenous Every 8 hours 09/12/21 2025     09/12/21 2100  ampicillin (OMNIPEN) 2 g in sodium chloride 0.9 % 100 mL IVPB        2 g 300 mL/hr over 20 Minutes Intravenous Every 4 hours 09/12/21 2025     09/12/21 0000  vancomycin (VANCOREADY) IVPB 750 mg/150 mL  Status:  Discontinued        750 mg 150 mL/hr over 60 Minutes Intravenous Every 12 hours 09/17/2021 1733 09/13/21 1241   09/08/2021 2359  metroNIDAZOLE (FLAGYL) IVPB 500 mg  Status:  Discontinued        500 mg 100 mL/hr over 60 Minutes  Intravenous Every 12 hours 09/19/2021 2127 09/13/21 0847   09/13/2021 2200  ceFEPIme (MAXIPIME) 2 g in sodium chloride 0.9 % 100 mL IVPB  Status:  Discontinued        2 g 200 mL/hr over 30 Minutes Intravenous Every 8 hours 09/10/2021 1733 09/13/21 0848   09/14/2021 1145  vancomycin (VANCOREADY) IVPB 1500 mg/300 mL        1,500 mg 150 mL/hr over 120 Minutes Intravenous  Once 09/12/2021 1117 09/16/2021 1539   08/28/2021 1115  ceFEPIme (MAXIPIME) 2 g in sodium chloride 0.9 % 100 mL IVPB        2 g 200 mL/hr over 30 Minutes Intravenous  Once 09/16/2021 1105 09/17/2021 1216   08/28/2021 1115  metroNIDAZOLE (FLAGYL) IVPB 500 mg        500 mg 100 mL/hr over 60 Minutes Intravenous  Once 09/03/2021 1105 09/25/2021 1246   08/30/2021 1115  vancomycin (VANCOCIN) IVPB 1000 mg/200 mL premix  Status:  Discontinued        1,000 mg 200 mL/hr over 60 Minutes Intravenous  Once 09/24/2021 1105 09/14/2021 1117              Family Communication/Anticipated D/C date and plan/Code Status   DVT prophylaxis: SCDs Start: 09/08/2021 2128     Code Status: Full Code  Family Communication: Plan discussed with her son and sister at the bedside. Disposition Plan:    Status is: Inpatient  Remains inpatient appropriate because: She is on IV medications           Subjective:   Interval events noted.  She is unable to provide any history but she said she feels okay.  Her son and sister were at the bedside.  Objective:    Vitals:   09/14/21 0615 09/14/21 0630 09/14/21 0645 09/14/21 0800  BP: (!) 117/54 (!) 113/38 (!) 111/55   Pulse: (!) 106 (!) 108 (!) 102   Resp: 20 19 (!) 25   Temp:    98.4 F (36.9 C)  TempSrc:    Oral  SpO2: 98% 97% 96%    No data found.   Intake/Output Summary (Last 24 hours) at 09/14/2021 0931 Last data filed at 09/14/2021 0409 Gross per 24 hour  Intake 2670.02 ml  Output 300 ml  Net 2370.02 ml   There were no vitals filed for this visit.  Exam:  GEN: NAD SKIN: Warm and dry EYES:  No pallor or icterus ENT: MMM CV: RRR PULM: CTA B ABD: soft, ND, NT, +BS CNS: AAO x 1 (person), non focal EXT: No edema or tenderness        Data Reviewed:   I have personally reviewed following labs and imaging studies:  Labs: Labs show the following:   Basic Metabolic Panel: Recent Labs  Lab 08/31/2021 1155 09/25/2021 1201 09/12/21 0248  09/14/21 0004  NA 135 135 137 139  K 3.5 3.5 3.4* 3.3*  CL 86* 86* 92* 97*  CO2 38*  --  33* 34*  GLUCOSE 107* 106* 84 112*  BUN _0 CREATININE 0.95 0.80 0.63 0.74  CALCIUM 8.6*  --  8.3* 7.4*  MG  --   --   --  1.2*   GFR Estimated Creatinine Clearance: 77.8 mL/min (by C-G formula based on SCr of 0.74 mg/dL). Liver Function Tests: Recent Labs  Lab 08/31/2021 1155 09/12/21 0248 09/14/21 0004  AST 17 14* 14*  ALT _1 ALKPHOS 73 72 62  BILITOT 0.6 0.6 0.5  PROT 7.1 6.2* 5.5*  ALBUMIN 2.7* 2.4* 2.2*   No results for input(s): LIPASE, AMYLASE in the last 168 hours. Recent Labs  Lab 09/01/2021 1155 09/07/2021 2302  AMMONIA 18 31   Coagulation profile Recent Labs  Lab 09/17/2021 1155 09/12/21 0248  INR 1.1 1.1    CBC: Recent Labs  Lab 09/02/2021 1155 09/17/2021 1201 09/12/21 0248 09/13/21 0254 09/14/21 0004  WBC 2.0*  --  2.4* 2.1* 2.9*  NEUTROABS 1.5*  --   --  1.5*  --   HGB 10.0* 10.9* 10.0* 8.3* 7.8*  HCT 33.6* 32.0* 33.8* 28.4* 26.9*  MCV 84.2  --  84.9 87.4 86.5  PLT 139*  --  111* 129* 133*   Cardiac Enzymes: No results for input(s): CKTOTAL, CKMB, CKMBINDEX, TROPONINI in the last 168 hours. BNP (last 3 results) No results for input(s): PROBNP in the last 8760 hours. CBG: No results for input(s): GLUCAP in the last 168 hours. D-Dimer: No results for input(s): DDIMER in the last 72 hours. Hgb A1c: No results for input(s): HGBA1C in the last 72 hours. Lipid Profile: No results for input(s): CHOL, HDL, LDLCALC, TRIG, CHOLHDL, LDLDIRECT in the last 72 hours. Thyroid function studies: Recent  Labs    09/12/21 0855  TSH 2.466   Anemia work up: Recent Labs    09/12/21 0855  VITAMINB12 278  FOLATE 4.4*   Sepsis Labs: Recent Labs  Lab 09/10/2021 1155 09/12/21 0248 09/12/21 0646 09/13/21 0254 09/14/21 0004  PROCALCITON  --   --  0.40  --   --   WBC 2.0* 2.4*  --  2.1* 2.9*  LATICACIDVEN 1.7  --   --   --   --     Microbiology Recent Results (from the past 240 hour(s))  Blood Culture (routine x 2)     Status: None (Preliminary result)   Collection Time: 09/12/2021 11:55 AM   Specimen: BLOOD RIGHT HAND  Result Value Ref Range Status   Specimen Description   Final    BLOOD RIGHT HAND Performed at [facility], [place] [address] [place], [place] [address]    Special Requests   Final    BOTTLES DRAWN AEROBIC AND ANAEROBIC Blood Culture adequate volume Performed at [facility] [address], [place], [place] [address]    Culture   Final    NO GROWTH 2 DAYS Performed at [facility] [address], [place], [place] [address]    Report Status PENDING  Incomplete  Blood Culture (routine x 2)     Status: None (Preliminary result)   Collection Time: 09/05/2021 11:55 AM   Specimen: BLOOD  Result Value Ref Range Status   Specimen Description   Final    BLOOD LEFT ANTECUBITAL Performed at [facility]  [address], [place], [place] [address]    Special Requests   Final    BOTTLES DRAWN AEROBIC AND ANAEROBIC Blood Culture results may not be optimal due to an excessive volume of blood received in culture bottles Performed at [facility] [address] [place], [place] [address]    Culture   Final    NO GROWTH 2 DAYS Performed at [facility] [address] [place], [place] [address]    Report Status PENDING  Incomplete  Resp Panel by RT-PCR (Flu A&B, Covid) Nasopharyngeal Swab     Status: None   Collection Time: 08/26/2021  1:50 PM   Specimen: Nasopharyngeal Swab;  Nasopharyngeal(NP) swabs in vial transport medium  Result Value Ref Range Status   SARS Coronavirus 2 by RT PCR NEGATIVE NEGATIVE Final    Comment: (NOTE) SARS-CoV-2 target nucleic acids are NOT DETECTED.  The SARS-CoV-2 RNA is generally detectable in upper respiratory specimens during the acute phase of infection. The lowest concentration of SARS-CoV-2 viral copies this assay can detect is 138 copies/mL. A negative result does not preclude SARS-Cov-2 infection and should not be used as the sole basis for treatment or other patient management decisions. A negative result may occur with  improper specimen collection/handling, submission of specimen other than nasopharyngeal swab, presence of viral mutation(s) within the areas targeted by this assay, and inadequate number of viral copies(<138 copies/mL). A negative result must be combined with clinical observations, patient history, and epidemiological information. The expected result is Negative.  Fact Sheet for Patients:  EntrepreneurPulse.com.au  Fact Sheet for Healthcare Providers:  IncredibleEmployment.be  This test is no t yet approved or cleared by the [place] FDA and  has been authorized for detection and/or diagnosis of SARS-CoV-2 by FDA under an Emergency Use Authorization (EUA). This EUA will remain  in effect (meaning this test can be used) for the duration of the COVID-19 declaration under Section 564(b)(1) of the Act, 21 U.S.C.section 360bbb-3(b)(1), unless the authorization is terminated  or revoked sooner.       Influenza A by PCR NEGATIVE NEGATIVE Final   Influenza B by PCR NEGATIVE NEGATIVE Final    Comment: (NOTE) The Xpert Xpress SARS-CoV-2/FLU/RSV plus assay is intended as an aid in the diagnosis of influenza from Nasopharyngeal swab specimens and should not be used as a sole basis for treatment. Nasal washings and aspirates are unacceptable for Xpert Xpress  SARS-CoV-2/FLU/RSV testing.  Fact Sheet for Patients: EntrepreneurPulse.com.au  Fact Sheet for Healthcare Providers: IncredibleEmployment.be  This test is not yet approved or cleared by the [place] FDA and has been authorized for detection and/or diagnosis of SARS-CoV-2 by FDA under an Emergency Use Authorization (EUA). This EUA will remain in effect (meaning this test can be used) for the duration of the COVID-19 declaration under Section 564(b)(1) of the Act, 21 U.S.C. section 360bbb-3(b)(1), unless the authorization is terminated or revoked.  Performed at [facility], [place] [address] [place], [place] [address]   Urine Culture     Status: None   Collection Time: 09/07/2021  2:57 PM   Specimen: Urine, Random  Result Value Ref Range Status   Specimen Description   Final    URINE, RANDOM Performed at [facility] [address] [place], [place] [address]    Special Requests   Final    NONE Performed at [facility], [place] [address] [place],  [address]    Culture   Final  NO GROWTH Performed at [facility], [place] [address], [place], [place] [address]    Report Status 09/12/2021 FINAL  Final  MRSA Next Gen by PCR, Nasal     Status: None   Collection Time: 09/14/2021  9:13 PM   Specimen: Nasal Mucosa; Nasal Swab  Result Value Ref Range Status   MRSA by PCR Next Gen NOT DETECTED NOT DETECTED Final    Comment: (NOTE) The GeneXpert MRSA Assay (FDA approved for NASAL specimens only), is one component of a comprehensive MRSA colonization surveillance program. It is not intended to diagnose MRSA infection nor to guide or monitor treatment for MRSA infections. Test performance is not FDA approved in patients less than 33 years old. Performed at [facility], [place] [address] [place], [place] [address]     Procedures and diagnostic studies:  EEG  adult  Result Date: 09/12/2021 Lora Havens, MD     09/12/2021  8:19 PM Patient Name: VICTORIOUS COSIO MRN: [id number] Epilepsy Attending: Lora Havens Referring Physician/Provider: Dr Cherylann Ratel Date: 09/12/2021 Duration: 26.41 mins Patient history: 65 year old female with altered mental status.  EEG to evaluate for seizure. Level of alertness: Awake AEDs during EEG study: None Technical aspects: This EEG study was done with scalp electrodes positioned according to the 10-20 International system of electrode placement. Electrical activity was acquired at a sampling rate of _0  and reviewed with a high frequency filter of _1  and a low frequency filter of _2 . EEG data were recorded continuously and digitally stored. Description: No posterior dominant rhythm was seen. EEG showed continuous generalized sharply contoured 3 to 6 Hz theta-delta slowing, at times with triphasic morphology. Hyperventilation and photic stimulation were not performed.   ABNORMALITY - Continuous slow, generalized -Triphasic waves, generalized IMPRESSION: This study is suggestive of moderate diffuse encephalopathy, nonspecific etiology but could be secondary to toxic-metabolic causes, cefepime toxicity. No seizures or definite epileptiform discharges were seen throughout the recording. [place]               LOS: 3 days   Shyheim Tanney  [facility]   Pager on [email or web address]. If 7PM-7AM, please contact night-coverage at [email or web address]     09/14/2021, 9:31 AM

## 2021-09-14 NOTE — Progress Notes (Signed)
ANTICOAGULATION CONSULT NOTE - follow up  Pharmacy Consult for heparin Indication: atrial fibrillation  Allergies  Allergen Reactions   Dyazide [Hydrochlorothiazide W-Triamterene]     hives   Zestoretic [Lisinopril-Hydrochlorothiazide]     Swelling     Patient Measurements:   Heparin Dosing Weight: 74.6 kg  Vital Signs: Temp: 97.8 F (36.6 C) (10/20 1920) Temp Source: Oral (10/20 1920) BP: 105/53 (10/20 1700) Pulse Rate: 95 (10/20 1700)  Labs: Recent Labs    09/12/21 0248 09/13/21 0254 09/14/21 0004 09/14/21 0744 09/14/21 1709  HGB 10.0* 8.3* 7.8*  --   --   HCT 33.8* 28.4* 26.9*  --   --   PLT 111* 129* 133*  --   --   LABPROT 14.6  --   --   --   --   INR 1.1  --   --   --   --   HEPARINUNFRC  --   --  0.20* 0.26* 0.35  CREATININE 0.63  --  0.74  --   --      Estimated Creatinine Clearance: 77.8 mL/min (by C-G formula based on SCr of 0.74 mg/dL).   Medical History: Past Medical History:  Diagnosis Date   Angioedema    COPD (chronic obstructive pulmonary disease) ([facility])    FHx: migraine headaches    Hyperlipidemia    Hypertension    Seasonal allergies     Medications:  Scheduled:   chlorhexidine  15 mL Mouth Rinse BID   Chlorhexidine Gluconate Cloth  6 each Topical Daily   mouth rinse  15 mL Mouth Rinse q12n4p   Infusions:   sodium chloride 10 mL/hr at 09/13/21 0235   sodium chloride Stopped (09/14/21 0200)   acyclovir (ZOVIRAX) [phone] mg IVPB Stopped (09/14/21 1718)   amiodarone 30 mg/hr (09/14/21 1844)   ampicillin (OMNIPEN) IV Stopped (09/14/21 1759)   cefTRIAXone (ROCEPHIN)  IV Stopped (09/14/21 1115)   diltiazem (CARDIZEM) infusion Stopped (09/13/21 1817)   heparin 1,400 Units/hr (09/14/21 1200)   vancomycin Stopped (09/14/21 1236)    Assessment: 65 yo female with new atrial fibrillation/flutter.  No history of anticoagulation PTA and none received this admit.  Patient case discussed with cardiology who recommended starting patient on  heparin IV.  Baseline aPTT wnl at 32 seconds and INR 1.1.  Patient is pancytopenic likely due to chemo/radiation therapy for lung adenocarcinoma.  Pharmacy consulted to dose heparin.    Today, 09/14/21 HL 0.20 subtherapeutic on 1100 units/hr -Hgb down from 10 >7.8, plts up to 133 from 111 -No s/sx bleeding per RN  Today 09/14/21 7:31 PM  HL 0.35 therapeutic No bleeding or infusion-related issues per RN  Goal of Therapy:  Heparin level 0.3-0.7 units/ml Monitor platelets by anticoagulation protocol: Yes   Plan:  Continue heparin infusion at 1250 untis/hr Daily HL/CBC Monitor for s/sx bleeding F/u ability to transition to oral anticoagulant  Napoleon Form 09/14/2021, 7:31 PM

## 2021-09-14 NOTE — Progress Notes (Signed)
[facility] Progress Note Patient Name: MASIEL GENTZLER DOB: 1956/01/14 MRN: [id number]   Date of Service  09/14/2021  HPI/Events of Note  K+ 3.0, Mg+ 1.9.  eICU Interventions  Adult electrolyte replacement protocol orders entered for K+ and Mg+.        Kerry Kass Hal Norrington 09/14/2021, 10:46 PM

## 2021-09-14 NOTE — Progress Notes (Signed)
ANTICOAGULATION CONSULT NOTE - follow up  Pharmacy Consult for heparin Indication: atrial fibrillation  Allergies  Allergen Reactions   Dyazide [Hydrochlorothiazide W-Triamterene]     hives   Zestoretic [Lisinopril-Hydrochlorothiazide]     Swelling     Patient Measurements:   Heparin Dosing Weight: 74.6 kg  Vital Signs: Temp: 98.4 F (36.9 C) (10/20 0800) Temp Source: Oral (10/20 0800) BP: 111/55 (10/20 0645) Pulse Rate: 102 (10/20 0645)  Labs: Recent Labs    09/08/2021 1155 09/09/2021 1201 09/12/21 0248 09/13/21 0254 09/14/21 0004 09/14/21 0744  HGB 10.0* 10.9* 10.0* 8.3* 7.8*  --   HCT 33.6* 32.0* 33.8* 28.4* 26.9*  --   PLT 139*  --  111* 129* 133*  --   APTT 32  --   --   --   --   --   LABPROT 14.2  --  14.6  --   --   --   INR 1.1  --  1.1  --   --   --   HEPARINUNFRC  --   --   --   --  0.20* 0.26*  CREATININE 0.95 0.80 0.63  --  0.74  --      Estimated Creatinine Clearance: 77.8 mL/min (by C-G formula based on SCr of 0.74 mg/dL).   Medical History: Past Medical History:  Diagnosis Date   Angioedema    COPD (chronic obstructive pulmonary disease) ([facility])    FHx: migraine headaches    Hyperlipidemia    Hypertension    Seasonal allergies     Medications:  Scheduled:   chlorhexidine  15 mL Mouth Rinse BID   Chlorhexidine Gluconate Cloth  6 each Topical Daily   mouth rinse  15 mL Mouth Rinse q12n4p   Infusions:   sodium chloride 10 mL/hr at 09/13/21 0235   sodium chloride Stopped (09/14/21 0200)   acyclovir (ZOVIRAX) [phone] mg IVPB Stopped (09/14/21 0626)   amiodarone 30 mg/hr (09/14/21 0525)   ampicillin (OMNIPEN) IV 2 g (09/14/21 0927)   cefTRIAXone (ROCEPHIN)  IV Stopped (09/13/21 2243)   diltiazem (CARDIZEM) infusion Stopped (09/13/21 1817)   heparin 1,250 Units/hr (09/14/21 0409)   potassium chloride 10 mEq (09/14/21 0922)   vancomycin Stopped (09/14/21 0059)    Assessment: 65 yo female with new atrial fibrillation/flutter.  No  history of anticoagulation PTA and none received this admit.  Patient case discussed with cardiology who recommended starting patient on heparin IV.  Baseline aPTT wnl at 32 seconds and INR 1.1.  Patient is pancytopenic likely due to chemo/radiation therapy for lung adenocarcinoma.  Pharmacy consulted to dose heparin.    Today, 09/14/21 HL 0.26 subtherapeutic on 1250 units/hr Hgb down from 10 >7.8, plts up to 133 from 111 No s/sx bleeding per RN Per cards, continuing heparin for now but will need to weigh risks/benefits of long term anticoag due to concerns for bleeding in patient and baseline anemia  Goal of Therapy:  Heparin level 0.3-0.7 units/ml Monitor platelets by anticoagulation protocol: Yes   Plan:  Increase heparin drip to 1400 units/hr Repeat heparin level 6 hours after rate change Daily CBC Monitor for s/sx bleeding   Adrian Saran, PharmD, BCPS Secure Chat if ?s 09/14/2021 9:45 AM

## 2021-09-14 NOTE — Consult Note (Addendum)
Cardiology Consultation:   Patient ID: Patricia Sampson MRN: [id number]; DOB: 05/17/1956  Admit date: 08/27/2021 Date of Consult: 09/14/2021  PCP:  Greig Right, MD   [facility] Prov[facility] Cardiologist:  New to [facility] - Dr. Acie Fredrickson     Patient Profile:   Patricia Sampson is a 65 y.o. female with a hx of COPD, lung cancer, HTN, HLD , pulmonary HTN  who is being seen 09/14/2021 for the evaluation of  rapid atrial flutter  at the request of Dr. Lavella Lemons.  History of Present Illness:   Patricia Sampson is a 65 year old female with past medical history of COPD, chronic respiratory failure on 6 L home O2, HTN, HLD, pulmonary hypertension, remote tobacco abuse, anemia and recently diagnosed stage IIIa non-small cell lung cancer in the left upper lobe on chemotherapy.  Patient was seen by [facility] cardiology service remotely in 2014 for evaluation of shortness of breath with exertion and pulmonary hypertension seen on echocardiogram.  It was felt that her shortness of breath is due to pulmonary issue rather than cardiac.  Echocardiogram at the time shows normal ejection fraction of 65 to 70%, RVSP 41 mmHg.  According to patient's son, she has been on 24/7 oxygen therapy at home since 2019 and currently followed by [name] as outpatient.  She uses around 6 L of oxygen at baseline according to her son.  Echocardiogram obtained on 01/06/2018 showed EF 60 to 65%, grade 1 DD, mild TR, PAP pressure 38 mmHg. Unfortunately, she was diagnosed with lung cancer since August 2022 and has been started on chemotherapy.  She is being followed by Dr. Bradd Canary of oncology service.  Oncology service has also been following her anemia.  She denies any recent chest pain, however complained to her son that she has tachycardia with physical exertion.  She presented to [facility] on 09/23/2021 with altered mental status. There is a question if she may have not taken her meds for 2-3 days. MRI of brain shows  motion degradation due to noncooperative patient however no acute finding.  Lab work shows pancytopenia.  She also had fever, tachypnea, and tachycardia on arrival and treated with 3 antibiotic plus acyclovir for SIRS with meningitis coverage. Urine and blood cultures were obtained however no clear source of infection has been identified.  Procalcitonin 0.40.  On the night of 09/13/2021, she went into rapid atrial fibrillation with RVR around 16:56.  She was reportedly given 2 doses of 3 mg adenosine without effect, she was given a 6 mg adenosine which slowed her heart rate down to flutter waves.  However on review of telemetry, I am unable to locate this flutter waves.  Unfortunately, her heart rate remained elevated afterward despite addition of IV diltiazem, prompting the addition of IV amiodarone and IV heparin.  She quickly converted out of atrial fibrillation around 18:36 and has been able to maintain sinus rhythm since.  Cardiology service has been consulted for atrial fibrillation with RVR.   Past Medical History:  Diagnosis Date   Angioedema    COPD (chronic obstructive pulmonary disease) (HCC)    FHx: migraine headaches    Hyperlipidemia    Hypertension    Seasonal allergies     History reviewed. No pertinent surgical history.   Home Medications:  Prior to Admission medications   Medication Sig Start Date End Date Taking? Authorizing Provider  acetaminophen (TYLENOL) 650 MG CR tablet Take 650-1,300 mg by mouth every 8 (eight) hours as needed for pain.  Yes [provider]  furosemide (LASIX) 20 MG tablet Take 20 mg by mouth daily. 05/18/21  Yes [provider]  gabapentin (NEURONTIN) 100 MG capsule Take 100 mg by mouth at bedtime. 07/17/21  Yes [provider]  Magnesium 250 MG TABS Take 250 mg by mouth daily.   Yes [provider]  potassium chloride (MICRO-K) 10 MEQ CR capsule Take 10 mEq by mouth 2 (two) times daily.   Yes [provider]   prochlorperazine (COMPAZINE) 10 MG tablet Take 1 tablet (10 mg total) by mouth every 6 (six) hours as needed for nausea or vomiting. 07/19/21  Yes Curt Bears, MD  rosuvastatin (CRESTOR) 10 MG tablet Take 10 mg by mouth daily.   Yes [provider]  sucralfate (CARAFATE) 1 g tablet Take 1 tablet (1 g total) by mouth 4 (four) times daily. Dissolve each tablet in 15 cc water before use. 09/01/21  Yes Kyung Rudd, MD  albuterol (VENTOLIN HFA) 108 (90 Base) MCG/ACT inhaler Inhale 2 puffs into the lungs every 4 (four) hours as needed for wheezing or shortness of breath. Patient not taking: No sig reported 01/07/20   Collene Gobble, MD  amitriptyline (ELAVIL) 10 MG tablet Take 10 mg by mouth daily.    [provider]  nystatin (MYCOSTATIN) 100000 UNIT/ML suspension Take 5 mLs (500,000 Units total) by mouth 4 (four) times daily. Patient not taking: No sig reported 07/25/21   Hayden Pedro, PA-C  oxyCODONE-acetaminophen (PERCOCET/ROXICET) 5-325 MG tablet Take 1 tablet by mouth every 6 (six) hours as needed for severe pain. Patient not taking: No sig reported 08/04/21   Heilingoetter, Cassandra L, PA-C  SYMBICORT 160-4.5 MCG/ACT inhaler TAKE 2 PUFFS BY MOUTH TWICE A DAY Patient not taking: No sig reported 01/24/21   Collene Gobble, MD  Tiotropium Bromide Monohydrate (SPIRIVA RESPIMAT) 2.5 MCG/ACT AERS Inhale 2 puffs into the lungs daily. Patient not taking: No sig reported 04/15/20   Collene Gobble, MD    Inpatient Medications: Scheduled Meds:  chlorhexidine  15 mL Mouth Rinse BID   Chlorhexidine Gluconate Cloth  6 each Topical Daily   mouth rinse  15 mL Mouth Rinse q12n4p   Continuous Infusions:  sodium chloride 10 mL/hr at 09/13/21 0235   sodium chloride Stopped (09/14/21 0200)   acyclovir (ZOVIRAX) [phone] mg IVPB Stopped (09/14/21 0626)   amiodarone 30 mg/hr (09/14/21 0525)   ampicillin (OMNIPEN) IV 2 g (09/14/21 0927)   cefTRIAXone (ROCEPHIN)  IV Stopped (09/13/21  2243)   diltiazem (CARDIZEM) infusion Stopped (09/13/21 1817)   heparin 1,250 Units/hr (09/14/21 0409)   potassium chloride 10 mEq (09/14/21 0922)   vancomycin Stopped (09/14/21 0059)   PRN Meds: sodium chloride, acetaminophen **OR** acetaminophen, ondansetron **OR** ondansetron (ZOFRAN) IV  Allergies:    Allergies  Allergen Reactions   Dyazide [Hydrochlorothiazide W-Triamterene]     hives   Zestoretic [Lisinopril-Hydrochlorothiazide]     Swelling     Social History:   Social History   Socioeconomic History   Marital status: Married    Spouse name: Not on file   Number of children: Not on file   Years of education: Not on file   Highest education level: Not on file  Occupational History   Not on file  Tobacco Use   Smoking status: Former    Packs/day: 1.00    Years: 30.00    Pack years: 30.00    Types: Cigarettes    Quit date: 11/26/2000    Years since  quitting: 20.8   Smokeless tobacco: Never  Substance and Sexual Activity   Alcohol use: No   Drug use: No   Sexual activity: Not on file  Other Topics Concern   Not on file  Social History Narrative   Not on file   Social Determinants of Health   Financial Resource Strain: Not on file  Food Insecurity: Not on file  Transportation Needs: Not on file  Physical Activity: Not on file  Stress: Not on file  Social Connections: Not on file  Intimate Partner Violence: Not on file    Family History:    Family History  Problem Relation Age of Onset   Diabetes Mellitus II Father    Coronary artery disease Mother    Hypertension Mother    COPD Mother      ROS:  Please see the history of present illness.   All other ROS reviewed and negative.     Physical Exam/Data:   Vitals:   09/14/21 0615 09/14/21 0630 09/14/21 0645 09/14/21 0800  BP: (!) 117/54 (!) 113/38 (!) 111/55   Pulse: (!) 106 (!) 108 (!) 102   Resp: 20 19 (!) 25   Temp:    98.4 F (36.9 C)  TempSrc:    Oral  SpO2: 98% 97% 96%      Intake/Output Summary (Last 24 hours) at 09/14/2021 0934 Last data filed at 09/14/2021 0409 Gross per 24 hour  Intake 2670.02 ml  Output 300 ml  Net 2370.02 ml   Last 3 Weights 09/04/2021 09/04/2021 08/28/2021  Weight (lbs) 164 lb 8 oz (No Data) (No Data)  Weight (kg) 74.617 kg (No Data) (No Data)     There is no height or weight on file to calculate BMI.  General:  chronically ill female, oriented to self and family member, not oriented to location, time and who is the president of [place]. HEENT: normal Neck: no JVD Vascular: No carotid bruits; Distal pulses 2+ bilaterally Cardiac:  normal S1, S2; RRR; no murmur Lungs:  clear to auscultation bilaterally, no wheezing, rhonchi or rales  Abd: soft, nontender, no hepatomegaly  Ext: no edema Musculoskeletal:  No deformities, BUE and BLE strength normal and equal Skin: warm and dry  Neuro:  CNs 2-12 intact, no focal abnormalities noted Psych:  Normal affect   EKG:  The EKG was personally reviewed and demonstrates: SVT versus atrial flutter with RVR on 09/13/2021. Telemetry:  Telemetry was personally reviewed and demonstrates: Short episode of atrial fibrillation with RVR between 16:56 PM and 18:36 PM on the night of 09/13/2021.  Patient has been maintaining sinus tachycardia since with heart rate in the low 100 range.  Relevant CV Studies:  Echo 01/06/2018 - Left ventricle: The cavity size was normal. Systolic function was    normal. The estimated ejection fraction was in the range of 60%    to 65%. Wall motion was normal; there were no regional wall    motion abnormalities. Doppler parameters are consistent with    abnormal left ventricular relaxation (grade 1 diastolic    dysfunction). There was no evidence of elevated ventricular    filling pressure by Doppler parameters.  - Aortic valve: Trileaflet; mildly thickened, mildly calcified    leaflets. There was trivial regurgitation.  - Aortic root: The aortic root was  normal in size.  - Mitral valve: There was mild regurgitation.  - Right ventricle: The cavity size was normal. Wall thickness was    normal. Systolic function was normal.  -  Right atrium: The atrium was normal in size.  - Tricuspid valve: There was mild regurgitation.  - Pulmonary arteries: Systolic pressure was mildly increased. PA    peak pressure: 38 mm Hg (S).  - Inferior vena cava: The vessel was normal in size.  - Pericardium, extracardiac: There was no pericardial effusion.   Laboratory Data:  High Sensitivity Troponin:  No results for input(s): TROPONINIHS in the last 720 hours.   Chemistry Recent Labs  Lab 08/31/2021 1155 09/05/2021 1201 09/12/21 0248 09/14/21 0004  NA 135 135 137 139  K 3.5 3.5 3.4* 3.3*  CL 86* 86* 92* 97*  CO2 38*  --  33* 34*  GLUCOSE 107* 106* 84 112*  BUN _0 CREATININE 0.95 0.80 0.63 0.74  CALCIUM 8.6*  --  8.3* 7.4*  MG  --   --   --  1.2*  GFRNONAA >60  --  >60 >60  ANIONGAP 11  --  12 8    Recent Labs  Lab 09/25/2021 1155 09/12/21 0248 09/14/21 0004  PROT 7.1 6.2* 5.5*  ALBUMIN 2.7* 2.4* 2.2*  AST 17 14* 14*  ALT _1 ALKPHOS 73 72 62  BILITOT 0.6 0.6 0.5   Lipids No results for input(s): CHOL, TRIG, HDL, LABVLDL, LDLCALC, CHOLHDL in the last 168 hours.  Hematology Recent Labs  Lab 09/12/21 0248 09/13/21 0254 09/14/21 0004  WBC 2.4* 2.1* 2.9*  RBC 3.98 3.25* 3.11*  HGB 10.0* 8.3* 7.8*  HCT 33.8* 28.4* 26.9*  MCV 84.9 87.4 86.5  MCH 25.1* 25.5* 25.1*  MCHC 29.6* 29.2* 29.0*  RDW 18.6* 18.6* 18.6*  PLT 111* 129* 133*   Thyroid  Recent Labs  Lab 09/12/21 0855  TSH 2.466    BNPNo results for input(s): BNP, PROBNP in the last 168 hours.  DDimer No results for input(s): DDIMER in the last 168 hours.   Radiology/Studies:  CT Head Wo Contrast  Result Date: 09/20/2021 CLINICAL DATA:  Altered mental status EXAM: CT HEAD WITHOUT CONTRAST TECHNIQUE: Contiguous axial images were obtained from the base of the  skull through the vertex without intravenous contrast. COMPARISON:  Brain MRI 07/26/2021 FINDINGS: Brain: There is no acute intracranial hemorrhage, extra-axial fluid collection, or acute infarct. Parenchymal volume is normal.  The ventricles are stable in size. There is no mass lesion.  There is no midline shift. Vascular: No hyperdense vessel or unexpected calcification. Skull: Normal. Negative for fracture or focal lesion. Sinuses/Orbits: The imaged paranasal sinuses are clear. The globes and orbits are unremarkable. Other: None. IMPRESSION: No acute intracranial pathology. Electronically Signed   By: Valetta Mole M.D.   On: 09/18/2021 13:47   CT Angio Chest Pulmonary Embolism (PE) W or WO Contrast  Result Date: 08/31/2021 CLINICAL DATA:  Altered mental status.  History of lung cancer. EXAM: CT ANGIOGRAPHY CHEST WITH CONTRAST TECHNIQUE: Multidetector CT imaging of the chest was performed using the standard protocol during bolus administration of intravenous contrast. Multiplanar CT image reconstructions and MIPs were obtained to evaluate the vascular anatomy. CONTRAST:  67m OMNIPAQUE IOHEXOL 350 MG/ML SOLN COMPARISON:  PET-CT dated June 23, 2021. CTA chest dated April 28, 2021. FINDINGS: Cardiovascular: Satisfactory opacification of the pulmonary arteries to the segmental level. No evidence of pulmonary embolism. Unchanged enlarged central pulmonary arteries. Normal heart size. No pericardial effusion. No thoracic aortic aneurysm or dissection. Mediastinum/Nodes: Unchanged large left suprahilar lymph node measuring 3.8 x 2.4 cm (series 4, image 67), previously 3.7 x 2.3 cm. The  thyroid gland, trachea, and esophagus demonstrate no significant findings. Lungs/Pleura: Enlarging anterior left upper lobe mass with similar mediastinal invasion, but progressive destruction of the anterior left second rib. New perilymphatic nodularity in the left upper lobe adjacent to the mass. Unchanged advanced centrilobular  emphysema. Trace left pleural effusion. Minimal subsegmental atelectasis in the left greater than right lower lobes. No pneumothorax. Upper Abdomen: No acute abnormality. Musculoskeletal: Progressive destruction of the anterior left second rib. Review of the MIP images confirms the above findings. IMPRESSION: 1. No evidence of pulmonary embolism. 2. 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 New perilymphatic nodularity in the left upper lobe adjacent to the mass, consistent with lymphangitic carcinomatosis. 3. 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 4. Trace left pleural effusion. Electronically Signed   By: Titus Dubin M.D.   On: 08/29/2021 18:32   MR BRAIN WO CONTRAST  Result Date: 09/19/2021 CLINICAL DATA:  Transient ischemic attack.  Altered mental status. EXAM: MRI HEAD WITHOUT CONTRAST TECHNIQUE: Multiplanar, multiecho pulse sequences of the brain and surrounding structures were obtained without intravenous contrast. COMPARISON:  Head CT earlier same day.  MRI 07/26/2021. FINDINGS: Brain: The study suffers from considerable motion degradation due to poor cooperation. Diffusion imaging does not show any acute or subacute infarction. There are minimal small vessel ischemic changes of the cerebral hemispheric white matter. No sign of large vessel infarction. No mass lesion, hemorrhage, hydrocephalus or extra-axial collection. Vascular: Major vessels at the base of the brain show flow. Skull and upper cervical spine: Negative Sinuses/Orbits: Clear/normal Other: None IMPRESSION: Significantly motion degraded exam. No sign of acute or subacute insult. Mild chronic small-vessel change of the cerebral hemispheric white matter. Electronically Signed   By: Nelson Chimes M.D.   On: 09/04/2021 20:19   CT ABDOMEN PELVIS W CONTRAST  Result Date: 08/27/2021 CLINICAL DATA:  Altered mental status, acute abdominal pain.  EXAM: CT ABDOMEN AND PELVIS WITH CONTRAST TECHNIQUE: Multidetector CT imaging of the abdomen and pelvis was performed using the standard protocol following bolus administration of intravenous contrast. CONTRAST:  48m OMNIPAQUE IOHEXOL 350 MG/ML SOLN COMPARISON:  PET 06/23/2021 and CT abdomen pelvis 04/20/2021. FINDINGS: Lower chest: Image quality in the lung bases is degraded by respiratory motion, limiting diagnostic quality. Heart size normal. No pericardial or pleural effusion. Distal esophagus is unremarkable. Hepatobiliary: Subcentimeter low-attenuation lesion in the right hepatic lobe is too small to characterize. Liver and gallbladder are otherwise unremarkable. No biliary ductal dilatation. Pancreas: Negative. Spleen: Negative. Adrenals/Urinary Tract: Adrenal glands are unremarkable. Low-attenuation lesion in the right kidney measures 1.4 cm, too small to characterize but likely a cyst, possibly minimally complex with a peripheral calcification. Kidneys are otherwise unremarkable. Ureters are decompressed. Bilateral hip arthroplasties create streak artifact, obscuring the majority of the bladder. Stomach/Bowel: Stomach, small bowel and colon are unremarkable. Appendix is not definitely visualized. Vascular/Lymphatic: Atherosclerotic calcification of the aorta. No pathologically enlarged lymph nodes. Reproductive: Uterus is visualized.  No adnexal mass. Other: No free fluid.  Mesenteries and peritoneum are unremarkable. Musculoskeletal: Bilateral hip arthroplasties. Degenerative changes in the spine. IMPRESSION: 1. No acute findings to explain the patient's abdominal pain. 2.  Aortic atherosclerosis (ICD10-I70.0). Electronically Signed   By: MLorin PicketM.D.   On: 08/29/2021 14:06   DG Chest Port 1 View  Result Date: 08/26/2021 CLINICAL DATA:  Altered mental status in a patient with history of pulmonary neoplasm. EXAM: PORTABLE CHEST 1 VIEW COMPARISON:  Comparison made with prior  imaging studies  from August of 2022. FINDINGS: EKG leads project over the chest. Trachea is midline. Cardiomediastinal contours and hilar structures are stable with fullness of the LEFT hilum and increased density about the LEFT upper chest compatible with known pulmonary neoplasm. No sign of lobar consolidation or visible pneumothorax. On limited assessment there is no acute skeletal process. Rib destruction of LEFT anterior ribs not as well demonstrated as on prior CT imaging. IMPRESSION: Stable findings of neoplasm in the LEFT chest. No acute cardiopulmonary disease. Electronically Signed   By: Zetta Bills M.D.   On: 08/28/2021 11:43   EEG adult  Result Date: 09/12/2021 Lora Havens, MD     09/12/2021  8:19 PM Patient Name: Patricia Sampson MRN: [id number] Epilepsy Attending: Lora Havens Referring Physician/Provider: Dr Cherylann Ratel Date: 09/12/2021 Duration: 26.41 mins Patient history: 65 year old female with altered mental status.  EEG to evaluate for seizure. Level of alertness: Awake AEDs during EEG study: None Technical aspects: This EEG study was done with scalp electrodes positioned according to the 10-20 International system of electrode placement. Electrical activity was acquired at a sampling rate of 500Hz and reviewed with a high frequency filter of 70Hz and a low frequency filter of 1Hz. EEG data were recorded continuously and digitally stored. Description: No posterior dominant rhythm was seen. EEG showed continuous generalized sharply contoured 3 to 6 Hz theta-delta slowing, at times with triphasic morphology. Hyperventilation and photic stimulation were not performed.   ABNORMALITY - Continuous slow, generalized -Triphasic waves, generalized IMPRESSION: This study is suggestive of moderate diffuse encephalopathy, nonspecific etiology but could be secondary to toxic-metabolic causes, cefepime toxicity. No seizures or definite epileptiform discharges were seen throughout the recording. Priyanka O  Yadav     Assessment and Plan:   Atrial fibrillation with RVR  -In the setting of SIRS.  Reportedly seen flutter waves after given digoxin, however unable to find flutter waves on the telemetry.  There are segments on the telemetry that is more consistent with atrial fibrillation with RVR.  Will review with MD.  -Initially placed on IV diltiazem without good rate control, she was placed on IV amiodarone which converted her to sinus rhythm.  She has been able to maintain sinus rhythm overnight.  -Currently on IV amiodarone and IV heparin.  We will continue both medication for now.  Long-term amiodarone usage is not ideal in this patient will who has severe lung disease.  Will discuss with MD. We will need to weight benefit and risk of anticoagulation therapy in light of her bleeding risk and baseline anemia.  SIRS: Per primary team  Altered mental status: Likely related to #2.  Receiving antibiotic.  Mental status has significantly improved in the past few days.  Acute on chronic respiratory failure: Followed by pulmonology service.  Hypertension: Blood pressure stable.  Hyperlipidemia  COPD on home O2: Per patient's son, she is on 6 L of home O2 at home 24/7 since at least 2019.     Risk Assessment/Risk Scores:   CHA2DS2-VASc Score = 2   This indicates a 2.2% annual risk of stroke. The patient's score is based upon: CHF History: 0 HTN History: 1 Diabetes History: 0 Stroke History: 0 Vascular Disease History: 0 Age Score: 0 Gender Score: 1      For questions or updates, please contact [place] Please consult [email or web address] for contact info under    Hilbert Corrigan, [place]  09/14/2021 9:34 AM  Attending Note:   The patient was  seen and examined.  Agree with assessment and plan as noted above.  Changes made to the above note as needed.  Patient seen and independently examined with Almyra Deforest, PA .   We discussed all aspects of the encounter. I agree with the assessment  and plan as stated above.   Paroxysmal atrial fibrillation: Patient was admitted with SIRS and rapid atrial fibrillation.  She has been started on antibiotics.  She originally received IV adenosine with demonstration of flutter waves.  She did not convert to sinus rhythm.  She was given IV diltiazem but this resulted in hypotension without any reduction of her heart rate.  She was then started on amiodarone which gradually slowed her heart rate and now she has converted to sinus rhythm.  She has been on IV heparin since last night.  Patent long discussion with family members and the patient regarding amiodarone and long-term side effects.  Patient is not coherent enough to understand conversation with family members seem to understand the potential side effects.  I think we should continue with IV amiodarone for the next day.  We can consider changing to oral amiodarone once she is eating and drinking normally.  She has severe lung disease and I suspect that she is at risk for recurrent atrial fibrillation although at this time I cannot tell whether her decompensation that resulted in her admission was more due to her SIRS or her rapid atrial fibrillation.  It is quite possible that if she has recurrent atrial fibrillation without having any concurrent infection that she would tolerate much better.  The other significant question is the question of long-term anticoagulation.  Her CHADS2VASC score  is 2.    She has a left upper lobe nonsmall cell lung cancer.  I would appreciate the input from pulmonary and oncology to know if this carcinoma is prone to bleeding.  If so then I would be less inclined to keep her on long-term anticoagulation.  She also has underlying anemia and pancytopenia.  There is no evidence of GI bleeding with any presence of bleeding anywhere would likely rule out long-term anticoagulation in her.   I have spent a total of 40 minutes with patient reviewing hospital  notes ,  telemetry, EKGs, labs and examining patient as well as establishing an assessment and plan that was discussed with the patient.  > 50% of time was spent in direct patient care.    Thayer Headings, Brooke Bonito., MD, [facility] 09/14/2021, 9:48 AM [address]  [place] Pager [phone]

## 2021-09-15 ENCOUNTER — Inpatient Hospital Stay: Payer: Self-pay

## 2021-09-15 ENCOUNTER — Other Ambulatory Visit: Payer: Self-pay

## 2021-09-15 ENCOUNTER — Ambulatory Visit
Admission: RE | Admit: 2021-09-15 | Discharge: 2021-09-15 | Disposition: A | Discharge status: Home / Self Care | Payer: 59 | Source: Ambulatory Visit | Attending: Radiation Oncology | Admitting: Radiation Oncology

## 2021-09-15 ENCOUNTER — Inpatient Hospital Stay (HOSPITAL_COMMUNITY): Payer: 59

## 2021-09-15 DIAGNOSIS — R4182 Altered mental status, unspecified: Secondary | ICD-10-CM | POA: Diagnosis not present

## 2021-09-15 DIAGNOSIS — R6510 Systemic inflammatory response syndrome (SIRS) of non-infectious origin without acute organ dysfunction: Secondary | ICD-10-CM | POA: Diagnosis not present

## 2021-09-15 DIAGNOSIS — R410 Disorientation, unspecified: Secondary | ICD-10-CM | POA: Diagnosis not present

## 2021-09-15 DIAGNOSIS — C3492 Malignant neoplasm of unspecified part of left bronchus or lung: Secondary | ICD-10-CM | POA: Diagnosis not present

## 2021-09-15 DIAGNOSIS — I480 Paroxysmal atrial fibrillation: Secondary | ICD-10-CM | POA: Diagnosis not present

## 2021-09-15 DIAGNOSIS — J9611 Chronic respiratory failure with hypoxia: Secondary | ICD-10-CM | POA: Diagnosis not present

## 2021-09-15 LAB — BLOOD GAS, ARTERIAL
Acid-Base Excess: 7.6 mmol/L — ABNORMAL HIGH (ref 0.0–2.0)
Acid-Base Excess: 9.9 mmol/L — ABNORMAL HIGH (ref 0.0–2.0)
Bicarbonate: 36.3 mmol/L — ABNORMAL HIGH (ref 20.0–28.0)
Bicarbonate: 36.4 mmol/L — ABNORMAL HIGH (ref 20.0–28.0)
Drawn by: 331471
Drawn by: 331471
FIO2: 64
O2 Content: 11 L/min
O2 Saturation: 97.3 %
O2 Saturation: 85.8 %
Patient temperature: 98.6
Patient temperature: 98.6
pCO2 arterial: 90.6 mmHg (ref 32.0–48.0)
pCO2 arterial: 67.9 mmHg (ref 32.0–48.0)
pH, Arterial: 7.227 — ABNORMAL LOW (ref 7.350–7.450)
pH, Arterial: 7.348 — ABNORMAL LOW (ref 7.350–7.450)
pO2, Arterial: 97.5 mmHg (ref 83.0–108.0)
pO2, Arterial: 51.8 mmHg — ABNORMAL LOW (ref 83.0–108.0)

## 2021-09-15 LAB — BASIC METABOLIC PANEL
Anion gap: 10 (ref 5–15)
Anion gap: 6 (ref 5–15)
BUN: 10 mg/dL (ref 8–23)
BUN: 8 mg/dL (ref 8–23)
CO2: 33 mmol/L — ABNORMAL HIGH (ref 22–32)
CO2: 36 mmol/L — ABNORMAL HIGH (ref 22–32)
Calcium: 7.8 mg/dL — ABNORMAL LOW (ref 8.9–10.3)
Calcium: 8.2 mg/dL — ABNORMAL LOW (ref 8.9–10.3)
Chloride: 93 mmol/L — ABNORMAL LOW (ref 98–111)
Chloride: 92 mmol/L — ABNORMAL LOW (ref 98–111)
Creatinine, Ser: 0.62 mg/dL (ref 0.44–1.00)
Creatinine, Ser: 0.55 mg/dL (ref 0.44–1.00)
GFR, Estimated: >60 mL/min (ref >60)
GFR, Estimated: >60 mL/min (ref >60)
Glucose, Bld: 118 mg/dL — ABNORMAL HIGH (ref 70–99)
Glucose, Bld: 125 mg/dL — ABNORMAL HIGH (ref 70–99)
Potassium: 3 mmol/L — ABNORMAL LOW (ref 3.5–5.1)
Potassium: 3.3 mmol/L — ABNORMAL LOW (ref 3.5–5.1)
Sodium: 136 mmol/L (ref 135–145)
Sodium: 134 mmol/L — ABNORMAL LOW (ref 135–145)

## 2021-09-15 LAB — CBC WITH DIFFERENTIAL/PLATELET
Abs Immature Granulocytes: 0.05 10*3/uL (ref 0.00–0.07)
Basophils Absolute: 0 10*3/uL (ref 0.0–0.1)
Basophils Relative: 0 %
Eosinophils Absolute: 0 10*3/uL (ref 0.0–0.5)
Eosinophils Relative: 0 %
HCT: 26.4 % — ABNORMAL LOW (ref 36.0–46.0)
Hemoglobin: 7.8 g/dL — ABNORMAL LOW (ref 12.0–15.0)
Immature Granulocytes: 2 %
Lymphocytes Relative: 9 %
Lymphs Abs: 0.3 10*3/uL — ABNORMAL LOW (ref 0.7–4.0)
MCH: 25.4 pg — ABNORMAL LOW (ref 26.0–34.0)
MCHC: 29.5 g/dL — ABNORMAL LOW (ref 30.0–36.0)
MCV: 86 fL (ref 80.0–100.0)
Monocytes Absolute: 0.3 10*3/uL (ref 0.1–1.0)
Monocytes Relative: 12 %
Neutro Abs: 2.2 10*3/uL (ref 1.7–7.7)
Neutrophils Relative %: 77 %
Platelets: 137 10*3/uL — ABNORMAL LOW (ref 150–400)
RBC: 3.07 MIL/uL — ABNORMAL LOW (ref 3.87–5.11)
RDW: 18.8 % — ABNORMAL HIGH (ref 11.5–15.5)
WBC: 2.9 10*3/uL — ABNORMAL LOW (ref 4.0–10.5)
nRBC: 0 % (ref 0.0–0.2)

## 2021-09-15 LAB — PHOSPHORUS: Phosphorus: 2.8 mg/dL (ref 2.5–4.6)

## 2021-09-15 LAB — MAGNESIUM: Magnesium: 1.7 mg/dL (ref 1.7–2.4)

## 2021-09-15 LAB — HEPARIN LEVEL (UNFRACTIONATED)
Heparin Unfractionated: 0.55 IU/mL (ref 0.30–0.70)
Heparin Unfractionated: 0.5 IU/mL (ref 0.30–0.70)

## 2021-09-15 IMAGING — DX DG CHEST 1V PORT
1 series · 1 of 1 positions shown · non-contrast
Comparison: CT chest angiography [DATE]

CLINICAL DATA: Dyspnea and respiratory abnormality

EXAM:
PORTABLE CHEST 1 VIEW

[chest ap]
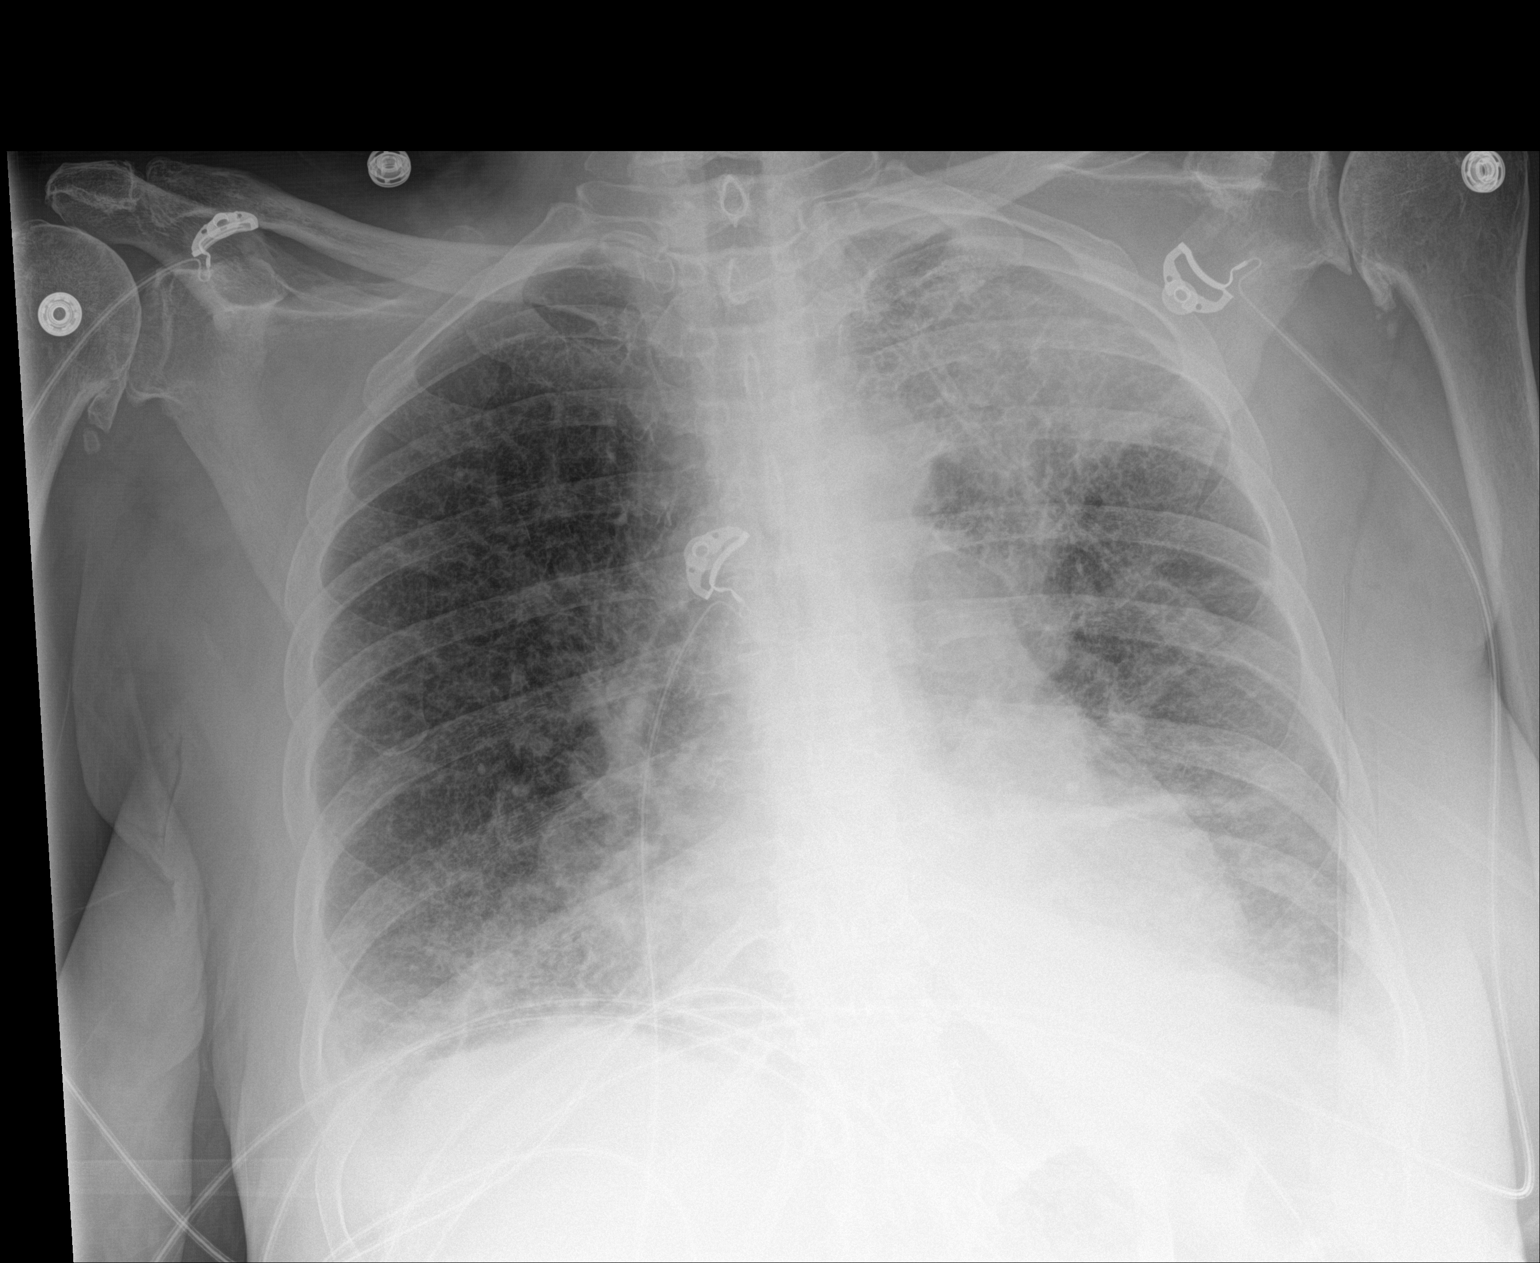

[1 of 1 positions shown; findings below may reference images not displayed]

FINDINGS: The heart and mediastinal contours are within normal limits.

Bibasilar patchy airspace opacities. Left upper lobe airspace
opacity. Increased and coarsened interstitial markings. No pulmonary
edema. Trace right pleural effusion. Possible trace left pleural
effusion. No pneumothorax.

No acute osseous abnormality. Degenerative changes of bilateral
shoulders.
IMPRESSION: 1. Interval development of bibasilar patchy airspace opacities which
may represent a combination of infection/inflammation and/or
atelectasis.
2. Trace right pleural effusion. Possible trace left pleural
effusion.
3. Left upper lobe airspace opacity consistent with known primary
bronchogenic carcinoma better evaluated on CT angiography chest
[DATE].
[DATE].  Emphysema ([KX]-[KX]).

## 2021-09-15 MED ORDER — POTASSIUM CHLORIDE 10 MEQ/100ML IV SOLN
10.0000 meq | Freq: Once | INTRAVENOUS | Status: AC
  Administered 2021-09-15: 10 meq via INTRAVENOUS
  Filled 2021-09-15: qty 100

## 2021-09-15 MED ORDER — HALOPERIDOL LACTATE 5 MG/ML IJ SOLN
3.0000 mg | Freq: Once | INTRAMUSCULAR | Status: AC
  Administered 2021-09-15: 3 mg via INTRAVENOUS
  Filled 2021-09-15: qty 1

## 2021-09-15 NOTE — Progress Notes (Signed)
Attempted to place PICC earlier after BiPAP placed, became agitated and decided not good time for placement. Spoke with [employer], RN, stated, that patient continues to be agitated and placing PICC tonight would not be in best interest. Will continue to monitor.

## 2021-09-15 NOTE — Progress Notes (Signed)
NAME:  Patricia Sampson, MRN:  [id number], DOB:  Oct 01, 1956, LOS: 4 ADMISSION DATE:  09/18/2021, CONSULTATION DATE: 09/24/2021 REFERRING MD: Dr. Marylyn Ishihara, CHIEF COMPLAINT: Encephalopathy  History of Present Illness:  65 year old woman with very severe COPD, associated chronic hypoxemic respiratory failure (4-8 L/min), hypertension, hyperlipidemia.  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 She was noted to have altered mental status on the morning of 10/17, appeared to be normal when she went to bed the night before.  In the emergency department she was tachycardic, tachypneic with pancytopenia.  Last chemotherapy infusion appears to have been 09/05/2021.  Acute metabolic encephalopathy.  She was treated empirically for possible sepsis, source unknown. Home med list with Percocet, Neurontin.  Her husband notes that he does not believe she is been taking any of her medicines for the last 2 to 3 days.   Pertinent  Medical History   Past Medical History:  Diagnosis Date   Angioedema    COPD (chronic obstructive pulmonary disease) (HCC)    FHx: migraine headaches    Hyperlipidemia    Hypertension    Seasonal allergies     Significant Hospital Events: Including procedures, antibiotic start and stop dates in addition to other pertinent events   CT abdomen pelvis 10/17 >> no acute findings Head CT 10/17 >> normal CT-PA chest 10/17 >> no new infiltrates or significant change in the left upper lobe mass.  No PE MRI brain 10/17 >> Motion degradation compromise interpretation, no acute or subacute infarct, no mass lesion  Progressive left upper lobe primary bronchogenic carcinoma with increased destruction of the anterior left second rib. New perilymphatic nodularity in the left upper lobe adjacent to the mass, consistent with lymphangitic carcinomatosis. 3. Unchanged and left suprahilar nodal metastatic  disease.  EEG 10/18 with generalized slowing, no evidence of active seizures or epileptiform activity 10/18 acyclovir and ampicillin added to cover possible meningitis 10/19 SVT, likely A. fib/flutter noted after adenosine given.  Hypotension with diltiazem, then started amiodarone.  Back in sinus tachycardia 10/20 - SVT with what looks like A. fib/flutter after adenosine given early evening 10/19.  No real response to diltiazem infusion, then given amiodarone.  Back in sinus tachycardia, . \atient continued to be aphasic and to have agitation through the day 10/19.  Now off Precedex. I/O positive 8.7 L total. Has had hypokalemia, replaced. WBC increasing, 2.9 this morning 10/20  Interim History / Subjective:    10/21 -husband and son at the bedside.  They tell me that after she had COVID in 2020 she has been on 6 L oxygen.  For the last 3 months before admissions she has had 35 pound weight loss approximately.  August 2022 was diagnosed with cancers in September 2022 was on chemoradiation.  Since chemoradiation there is more failure to thrive with progressive decrease in activity particularly after chemoradiation cycles.  Currently on 10 L nasal cannula pulse ox 95%.  Has not been on BiPAP.  Has not been on the ventilator.  Is not on pressors.  Not on Precedex.  Objective   Blood pressure 116/70, pulse (!) 102, temperature (!) 96.9 F (36.1 C), temperature source Axillary, resp. rate 19, SpO2 90 %.      Estimated body mass index is 23.6 kg/m as calculated from the following:   Height as of 09/04/21: _0  (1.778 m).   Weight as of 09/04/21: 74.6 kg.   Intake/Output Summary (Last 24  hours) at 09/15/2021 1128 Last data filed at 09/15/2021 3354 Gross per 24 hour  Intake 2421.25 ml  Output 400 ml  Net 2021.25 ml   There were no vitals filed for this visit.  Examination: Cachectic elderly frail female lying in the bed right lateral side.  Oxygen on.  Looks exhausted.  Clear to  auscultation abdomen soft mild chronic edema present.  Normal heart sounds is in atrial fibrillation   Resolved Hospital Problem list     Assessment & Plan:  Acute encephalopathy, appreciate neurology assistance with the case.  Some evolving evidence for an agitated delirium.  Still considering a toxic metabolic cause, no clear source of infection but consider meningitis.  LP deferred due to associated risk of performing.  No evidence for CVA, no evidence for active seizures or recent seizure.  Vitamin B12 borderline low.  She is on gabapentin at home but unlikely for this to represent withdrawal.    09/15/2021  -?  Better.  Awake and easily arousable but tends to drift off to sleep.  Confused.  Off Precedex since that 09/13/2021  Plan  meningitis coverage antibiotics including the acyclovir - per neuro - could consider ID consult if needed -Careful with sedating medications -Ensure adequate oxygenation -B12 supplementation   History of COVID in 2020 and oxygen dependent since then per history Acute on chronic hypoxemic respiratory failure.  She has severe COPD, is on 6-8 L/min at baseline. Severe COPD  09/15/2021: Is on 10 L oxygen pulse ox 95%  Plan - Check blood gas 09/15/2021 - consider biPAP QHS -Continue O2, goal SPO2 > 92%. -Based on her chronic respiratory failure she would do poorly if she were to require mechanical ventilation.  She is not appear to be at risk for this at this time.  Broached the subject with her husband at presentation.  They are considering goals of care, would like for her to be able to participate when her neurological status will allow.  She is full code at this time (documented 08/27/21)  SVT 10/19.- PAF - card consult since 09/14/21  10/21 - on amio gtt and heparin gtt  Plan  - per cards   Adenocarcinoma of the lung, on chemoradiation.   - Prior to & Present on Admit  - Stage IIIA (T4, N1, M0) non-small cell lung cancer, adenocarcinoma  presented with large left upper lobe lung mass with chest wall invasion in addition to left suprahilar lymphadenopathy diagnosed in August 2022.  - Progression with rib destruction /bony met - at admit per CT  Plan  - consider Onc consult for prognsosi  Anemia - Prior to & Present on Admit - baseline 10gm% Pancytopenia (new) on presentation without any clear evidence for infection or evidence for bleeding.  plan -Continue to follow CBC  FAilure to thrive with > 30# weight  loss - Prior to & Present on Admit Cancer pain on chronic opioid s - Prior to & Present on Admit Severe Protein calorie malnutiion - baseline alb 2.6   - Prior to & Present on Admit   Plan  - per [facility] - consider pallaitive care     Best Practice (right click and "Reselect all SmartList Selections" daily)   Diet/type: Regular consistency (see orders) DVT prophylaxis: other GI prophylaxis: N/A Lines: N/A Foley:  N/A Code Status:  full code Last date of multidisciplinary goals of care discussion [10/17. Discussed GOC briefly with pt's husband 10/17 -- need further discussions and would like to include pt, her  son. She is full code at this time.] Family: Updated son at bedside on 10/20 and 09/15/21   CCM will round again Monday 10/23. Stay in SDU d/w [facility] MD/.    ATTESTATION & SIGNATURE     Dr. Brand Males, M.D., [facility].C.P Pulmonary and Critical Care Medicine Staff Physician [facility] Pager: [phone], If no answer or between  15:00h - 7:00h: call [phone]  09/15/2021 11:28 AM    LABS    PULMONARY Recent Labs  Lab 09/09/2021 1201 09/09/2021 1745  PHART  --  7.444  PCO2ART  --  54.2*  PO2ART  --  35.2*  HCO3  --  36.6*  TCO2 37*  --   O2SAT  --  66.2    CBC Recent Labs  Lab 09/13/21 0254 09/14/21 0004 09/15/21 0255  HGB 8.3* 7.8* 7.8*  HCT 28.4* 26.9* 26.4*  WBC 2.1* 2.9* 2.9*  PLT 129* 133* 137*     COAGULATION Recent Labs  Lab 09/12/2021 1155 09/12/21 0248  INR 1.1 1.1    CARDIAC  No results for input(s): TROPONINI in the last 168 hours. No results for input(s): PROBNP in the last 168 hours.   CHEMISTRY Recent Labs  Lab 09/09/2021 1155 09/02/2021 1201 09/12/21 0248 09/14/21 0004 09/14/21 2141 09/15/21 0255  NA 135 135 137 139 135 136  K 3.5 3.5 3.4* 3.3* 3.0* 3.0*  CL 86* 86* 92* 97* 94* 93*  CO2 38*  --  33* 34* 30 33*  GLUCOSE 107* 106* 84 112* 109* 118*  BUN _0 CREATININE 0.95 0.80 0.63 0.74 0.57 0.62  CALCIUM 8.6*  --  8.3* 7.4* 7.9* 7.8*  MG  --   --   --  1.2* 1.9 1.7  PHOS  --   --   --   --   --  2.8   Estimated Creatinine Clearance: 77.8 mL/min (by C-G formula based on SCr of 0.62 mg/dL).   LIVER Recent Labs  Lab 09/24/2021 1155 09/12/21 0248 09/14/21 0004  AST 17 14* 14*  ALT _1 ALKPHOS 73 72 62  BILITOT 0.6 0.6 0.5  PROT 7.1 6.2* 5.5*  ALBUMIN 2.7* 2.4* 2.2*  INR 1.1 1.1  --      INFECTIOUS Recent Labs  Lab 09/09/2021 1155 09/12/21 0646  LATICACIDVEN 1.7  --   PROCALCITON  --  0.40     ENDOCRINE CBG (last 3)  No results for input(s): GLUCAP in the last 72 hours.       IMAGING x48h  - image(s) personally visualized  -   highlighted in bold ECHOCARDIOGRAM COMPLETE  Result Date: 09/14/2021    ECHOCARDIOGRAM REPORT   Patient Name:   Patricia Sampson Date of Exam: 09/14/2021 Medical Rec #:  [id number]        Height:       70.0 in Accession #:    [id number]       Weight:       164.5 lb Date of Birth:  March 23, 1956       BSA:          1.921 m Patient Age:    74 years         BP:           121/65 mmHg Patient Gender: F                HR:  104 bpm. Exam Location:  Inpatient Procedure: 2D Echo, Cardiac Doppler and Color Doppler Indications:    Abnormal Ekg  History:        Patient has prior history of Echocardiogram examinations, most                 recent 01/06/2017. Risk Factors:Hypertension.  Sonographer:     Helmut Muster Referring Phys: [place]  1. Left ventricular ejection fraction, by estimation, is 55 to 60%. The left ventricle has normal function. The left ventricle has no regional wall motion abnormalities. Left ventricular diastolic parameters are consistent with Grade I diastolic dysfunction (impaired relaxation).  2. There is mild respirophasic variation of interventricular septum position, possibly due to the patient's underlying lung disease. Right ventricular systolic function is normal. The right ventricular size is normal.  3. The mitral valve is normal in structure. No evidence of mitral valve regurgitation. No evidence of mitral stenosis.  4. The aortic valve is tricuspid. Aortic valve regurgitation is not visualized. No aortic stenosis is present.  5. The inferior vena cava is dilated in size with <50% respiratory variability, suggesting right atrial pressure of 15 mmHg. FINDINGS  Left Ventricle: Left ventricular ejection fraction, by estimation, is 55 to 60%. The left ventricle has normal function. The left ventricle has no regional wall motion abnormalities. The left ventricular internal cavity size was normal in size. There is  no left ventricular hypertrophy. Left ventricular diastolic parameters are consistent with Grade I diastolic dysfunction (impaired relaxation). Right Ventricle: There is mild respirophasic variation of interventricular septum position, possibly due to the patient's underlying lung disease. The right ventricular size is normal. No increase in right ventricular wall thickness. Right ventricular systolic function is normal. Left Atrium: Left atrial size was normal in size. Right Atrium: Right atrial size was normal in size. Pericardium: There is no evidence of pericardial effusion. Mitral Valve: The mitral valve is normal in structure. No evidence of mitral valve regurgitation. No evidence of mitral valve stenosis. MV peak gradient, 4.8 mmHg. The mean  mitral valve gradient is 3.0 mmHg. Tricuspid Valve: The tricuspid valve is normal in structure. Tricuspid valve regurgitation is not demonstrated. Aortic Valve: The aortic valve is tricuspid. Aortic valve regurgitation is not visualized. No aortic stenosis is present. Pulmonic Valve: The pulmonic valve was normal in structure. Pulmonic valve regurgitation is not visualized. Aorta: The aortic root is normal in size and structure. Venous: The inferior vena cava is dilated in size with less than 50% respiratory variability, suggesting right atrial pressure of 15 mmHg. IAS/Shunts: No atrial level shunt detected by color flow Doppler.  LEFT VENTRICLE PLAX 2D LVIDd:         5.00 cm     Diastology LVIDs:         3.40 cm     LV e' medial:    10.90 cm/s LV PW:         0.70 cm     LV E/e' medial:  8.5 LV IVS:        0.70 cm     LV e' lateral:   12.70 cm/s LVOT diam:     2.00 cm     LV E/e' lateral: 7.3 LV SV:         72 LV SV Index:   37 LVOT Area:     3.14 cm  LV Volumes (MOD) LV vol d, MOD A2C: 70.3 ml LV vol d, MOD A4C: 57.6 ml LV vol s, MOD  A2C: 28.1 ml LV vol s, MOD A4C: 24.0 ml LV SV MOD A2C:     42.2 ml LV SV MOD A4C:     57.6 ml LV SV MOD BP:      39.6 ml RIGHT VENTRICLE             IVC RV S prime:     20.90 cm/s  IVC diam: 2.60 cm TAPSE (M-mode): 3.0 cm LEFT ATRIUM             Index        RIGHT ATRIUM           Index LA diam:        2.70 cm 1.41 cm/m   RA Area:     11.10 cm LA Vol (A2C):   40.0 ml 20.82 ml/m  RA Volume:   25.00 ml  13.01 ml/m LA Vol (A4C):   57.4 ml 29.88 ml/m LA Biplane Vol: 52.8 ml 27.49 ml/m  AORTIC VALVE LVOT Vmax:   146.00 cm/s LVOT Vmean:  89.300 cm/s LVOT VTI:    0.229 m  AORTA Ao Root diam: 3.40 cm Ao Asc diam:  3.50 cm MITRAL VALVE MV Area (PHT): 5.13 cm    SHUNTS MV Area VTI:   3.38 cm    Systemic VTI:  0.23 m MV Peak grad:  4.8 mmHg    Systemic Diam: 2.00 cm MV Mean grad:  3.0 mmHg MV Vmax:       1.09 m/s MV Vmean:      83.9 cm/s MV Decel Time: 148 msec MV E velocity: 92.40 cm/s  MV A velocity: 98.50 cm/s MV E/A ratio:  0.94 Dalton McleanMD Electronically signed by Franki Monte Signature Date/Time: 09/14/2021/6:10:19 PM    Final    [place] EKG SITE RITE  Result Date: 09/15/2021 If Site Rite image not attached, placement could not be confirmed due to current cardiac rhythm.

## 2021-09-15 NOTE — Progress Notes (Addendum)
Progress Note    Patricia Sampson  BXI:[id number] DOB: 10-13-1956  DOA: 08/28/2021 PCP: Greig Right, MD      Brief Narrative:    Medical records reviewed and are as summarized below:  Patricia Sampson is a 65 y.o. 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  She presented to the hospital with altered mental status.  She was admitted to the hospital for SIRS, acute metabolic encephalopathy and pancytopenia.  It is suspected that patient probably has underlying infection however no source of infection has been identified thus far.  She was treated with empiric IV antibiotics and IV fluids.  She developed atrial flutter/atrial fibrillation with RVR and she was treated with IV heparin infusion and IV amiodarone infusion.      Assessment/Plan:   Principal Problem:   SIRS (systemic inflammatory response syndrome) (HCC) Active Problems:   COPD (chronic obstructive pulmonary disease) (HCC)   Chronic respiratory failure with hypoxia (HCC)   Altered mental status    SIRS, suspected infection of unclear etiology: Continue empiric IV antibiotics.  No growth on blood cultures thus far.  Patient/family declined lumbar puncture.  Consulted infectious disease specialist to assist with management.  Reordered lumbar puncture.  Her husband will consent to lumbar puncture patient's condition is stabilized with BiPAP.  Acute metabolic encephalopathy: Fluctuating mental status.  Continue supportive care  Acute hypoxemic and hypercapnic respiratory failure: She has been started on BiPAP.  ABG showed pH 7.227, PCO2 19.6 and PO2 97.5 on 64% oxygen.  Paroxysmal atrial fibrillation/atrial flutter with RVR: She converted to normal sinus rhythm.   Cardiologist plans to stop IV amiodarone and IV heparin.  Appreciate input from cardiologist.  Hypokalemia and hypomagnesemia: Continue potassium repletion.  Magnesium level is improved.  Monitor electrolytes.  Stage IIIa non-small cell lung cancer: She was on chemoradiation therapy prior to admission.  Outpatient follow-up with oncologist.  Pancytopenia: Monitor CBC.  No indication for blood or platelet transfusion.  Severe COPD with chronic hypoxemic and hypercapnic respiratory failure: Continue bronchodilators as needed.  She is on 4 to 6 L/min oxygen at home.  Case discussed with Dr. Chase Caller, intensivist. Consult palliative care to establish goals of care   Diet Order             Diet NPO time specified  Diet effective now                      Consultants: Cardiologist, intensivist  Procedures: None    Medications:    chlorhexidine  15 mL Mouth Rinse BID   Chlorhexidine Gluconate Cloth  6 each Topical Daily   mouth rinse  15 mL Mouth Rinse q12n4p   Continuous Infusions:  sodium chloride 10 mL/hr at 09/13/21 0235   sodium chloride Stopped (09/14/21 0200)   acyclovir (ZOVIRAX) [phone] mg IVPB Stopped (09/15/21 0249)   amiodarone 30 mg/hr (09/15/21 0604)   ampicillin (OMNIPEN) IV Stopped (09/15/21 7290)   cefTRIAXone (ROCEPHIN)  IV Stopped (09/15/21 0114)   diltiazem (CARDIZEM) infusion Stopped (09/13/21 1817)   heparin 1,400 Units/hr (09/15/21 2111)   potassium chloride     vancomycin Stopped (09/15/21 0400)     Anti-infectives (From admission, onward)    Start     Dose/Rate Route Frequency Ordered Stop   09/14/21 0000  vancomycin (VANCOCIN) IVPB 1000 mg/200 mL premix        1,000 mg 200 mL/hr over 60 Minutes Intravenous Every 12 hours 09/13/21 1241     09/13/21 1000  cefTRIAXone (ROCEPHIN) 2 g in sodium chloride 0.9 % 100 mL IVPB        2 g 200 mL/hr over 30 Minutes Intravenous Every 12 hours 09/13/21 0848     09/12/21 2200  acyclovir (ZOVIRAX)  745 mg in dextrose 5 % 150 mL IVPB        10 mg/kg  74.6 kg 164.9 mL/hr over 60 Minutes Intravenous Every 8 hours 09/12/21 2025     09/12/21 2100  ampicillin (OMNIPEN) 2 g in sodium chloride 0.9 % 100 mL IVPB        2 g 300 mL/hr over 20 Minutes Intravenous Every 4 hours 09/12/21 2025     09/12/21 0000  vancomycin (VANCOREADY) IVPB 750 mg/150 mL  Status:  Discontinued        750 mg 150 mL/hr over 60 Minutes Intravenous Every 12 hours 09/01/2021 1733 09/13/21 1241   09/03/2021 2359  metroNIDAZOLE (FLAGYL) IVPB 500 mg  Status:  Discontinued        500 mg 100 mL/hr over 60 Minutes Intravenous Every 12 hours 09/20/2021 2127 09/13/21 0847   09/02/2021 2200  ceFEPIme (MAXIPIME) 2 g in sodium chloride 0.9 % 100 mL IVPB  Status:  Discontinued        2 g 200 mL/hr over 30 Minutes Intravenous Every 8 hours 09/05/2021 1733 09/13/21 0848   09/01/2021 1145  vancomycin (VANCOREADY) IVPB 1500 mg/300 mL        1,500 mg 150 mL/hr over 120 Minutes Intravenous  Once 09/10/2021 1117 09/06/2021 1539   09/10/2021 1115  ceFEPIme (MAXIPIME) 2 g in sodium chloride 0.9 % 100 mL IVPB        2 g 200 mL/hr over 30 Minutes Intravenous  Once 09/16/2021 1105 09/08/2021 1216   09/10/2021 1115  metroNIDAZOLE (FLAGYL) IVPB 500 mg        500 mg 100 mL/hr over 60 Minutes Intravenous  Once 09/22/2021 1105 09/06/2021 1246   09/08/2021 1115  vancomycin (VANCOCIN) IVPB 1000 mg/200 mL premix  Status:  Discontinued        1,000 mg 200 mL/hr over 60 Minutes Intravenous  Once 09/14/2021 1105 08/31/2021 1117              Family Communication/Anticipated D/C date and plan/Code Status   DVT prophylaxis: SCDs Start: 09/21/2021 2128     Code Status: Full Code  Family Communication: Plan discussed with her husband at the bedside. Disposition Plan:    Status is: Inpatient  Remains inpatient appropriate because: She is on IV medications           Subjective:   Interval events noted.  She is somnolent and unable to provide any history.  Her  husband was at the bedside.  Her husband said patient had a restless night and was confused overnight.    Objective:    Vitals:   09/15/21 0400 09/15/21 0500 09/15/21 0600 09/15/21 0800  BP: 113/60 111/71 116/70   Pulse: 96 100 (!) 102   Resp: _0 Temp: 97.9 F (36.6 C)   (!) 96.9 F (36.1 C)  TempSrc: Oral   Axillary  SpO2: 96% 91% 90%    No data found.   Intake/Output Summary (Last 24 hours) at 09/15/2021 0838 Last data filed at 09/15/2021 0626 Gross per  24 hour  Intake 2421.25 ml  Output 400 ml  Net 2021.25 ml   There were no vitals filed for this visit.  Exam:  GEN: NAD SKIN: Warm and dry EYES: No pallor or icterus ENT: MMM CV: RRR PULM: CTA B ABD: soft, ND, NT, +BS CNS: Drowsy but arousable.  She moves all 4 extremities spontaneously EXT: No edema or tenderness       Data Reviewed:   I have personally reviewed following labs and imaging studies:  Labs: Labs show the following:   Basic Metabolic Panel: Recent Labs  Lab 09/12/2021 1155 09/22/2021 1201 09/12/21 0248 09/14/21 0004 09/14/21 2141 09/15/21 0255  NA 135 135 137 139 135 136  K 3.5 3.5 3.4* 3.3* 3.0* 3.0*  CL 86* 86* 92* 97* 94* 93*  CO2 38*  --  33* 34* 30 33*  GLUCOSE 107* 106* 84 112* 109* 118*  BUN _0 CREATININE 0.95 0.80 0.63 0.74 0.57 0.62  CALCIUM 8.6*  --  8.3* 7.4* 7.9* 7.8*  MG  --   --   --  1.2* 1.9 1.7  PHOS  --   --   --   --   --  2.8   GFR Estimated Creatinine Clearance: 77.8 mL/min (by C-G formula based on SCr of 0.62 mg/dL). Liver Function Tests: Recent Labs  Lab 09/12/2021 1155 09/12/21 0248 09/14/21 0004  AST 17 14* 14*  ALT _1 ALKPHOS 73 72 62  BILITOT 0.6 0.6 0.5  PROT 7.1 6.2* 5.5*  ALBUMIN 2.7* 2.4* 2.2*   No results for input(s): LIPASE, AMYLASE in the last 168 hours. Recent Labs  Lab 09/12/2021 1155 09/23/2021 2302  AMMONIA 18 31   Coagulation profile Recent Labs  Lab 09/06/2021 1155 09/12/21 0248  INR 1.1 1.1     CBC: Recent Labs  Lab 09/17/2021 1155 08/29/2021 1201 09/12/21 0248 09/13/21 0254 09/14/21 0004 09/15/21 0255  WBC 2.0*  --  2.4* 2.1* 2.9* 2.9*  NEUTROABS 1.5*  --   --  1.5*  --  2.2  HGB 10.0* 10.9* 10.0* 8.3* 7.8* 7.8*  HCT 33.6* 32.0* 33.8* 28.4* 26.9* 26.4*  MCV 84.2  --  84.9 87.4 86.5 86.0  PLT 139*  --  111* 129* 133* 137*   Cardiac Enzymes: No results for input(s): CKTOTAL, CKMB, CKMBINDEX, TROPONINI in the last 168 hours. BNP (last 3 results) No results for input(s): PROBNP in the last 8760 hours. CBG: No results for input(s): GLUCAP in the last 168 hours. D-Dimer: No results for input(s): DDIMER in the last 72 hours. Hgb A1c: No results for input(s): HGBA1C in the last 72 hours. Lipid Profile: No results for input(s): CHOL, HDL, LDLCALC, TRIG, CHOLHDL, LDLDIRECT in the last 72 hours. Thyroid function studies: Recent Labs    09/12/21 0855  TSH 2.466   Anemia work up: Recent Labs    09/12/21 0855  VITAMINB12 278  FOLATE 4.4*   Sepsis Labs: Recent Labs  Lab 09/14/2021 1155 09/12/21 0248 09/12/21 0646 09/13/21 0254 09/14/21 0004 09/15/21 0255  PROCALCITON  --   --  0.40  --   --   --   WBC 2.0* 2.4*  --  2.1* 2.9* 2.9*  LATICACIDVEN 1.7  --   --   --   --   --     Microbiology Recent Results (from the past 240 hour(s))  Blood Culture (routine x 2)     Status: None (Preliminary result)  Collection Time: 09/04/2021 11:55 AM   Specimen: BLOOD RIGHT HAND  Result Value Ref Range Status   Specimen Description   Final    BLOOD RIGHT HAND Performed at [facility] [address] [place], [place] [address]    Special Requests   Final    BOTTLES DRAWN AEROBIC AND ANAEROBIC Blood Culture adequate volume Performed at [facility] [address], [place], [place] [address]    Culture   Final    NO GROWTH 3 DAYS Performed at [facility], [place] [address], [place], [place] [address]    Report Status PENDING   Incomplete  Blood Culture (routine x 2)     Status: None (Preliminary result)   Collection Time: 09/02/2021 11:55 AM   Specimen: BLOOD  Result Value Ref Range Status   Specimen Description   Final    BLOOD LEFT ANTECUBITAL Performed at [facility] [address] [place], [place] [address]    Special Requests   Final    BOTTLES DRAWN AEROBIC AND ANAEROBIC Blood Culture results may not be optimal due to an excessive volume of blood received in culture bottles Performed at [facility] [address], [place], [place] [address]    Culture   Final    NO GROWTH 3 DAYS Performed at [facility], [place] [address], [place],  [address]    Report Status PENDING  Incomplete  Resp Panel by RT-PCR (Flu A&B, Covid) Nasopharyngeal Swab     Status: None   Collection Time: 09/14/2021  1:50 PM   Specimen: Nasopharyngeal Swab; Nasopharyngeal(NP) swabs in vial transport medium  Result Value Ref Range Status   SARS Coronavirus 2 by RT PCR NEGATIVE NEGATIVE Final    Comment: (NOTE) SARS-CoV-2 target nucleic acids are NOT DETECTED.  The SARS-CoV-2 RNA is generally detectable in upper respiratory specimens during the acute phase of infection. The lowest concentration of SARS-CoV-2 viral copies this assay can detect is 138 copies/mL. A negative result does not preclude SARS-Cov-2 infection and should not be used as the sole basis for treatment or other patient management decisions. A negative result may occur with  improper specimen collection/handling, submission of specimen other than nasopharyngeal swab, presence of viral mutation(s) within the areas targeted by this assay, and inadequate number of viral copies(<138 copies/mL). A negative result must be combined with clinical observations, patient history, and epidemiological information. The expected result is Negative.  Fact Sheet for Patients:  EntrepreneurPulse.com.au  Fact  Sheet for Healthcare Providers:  IncredibleEmployment.be  This test is no t yet approved or cleared by the [place] FDA and  has been authorized for detection and/or diagnosis of SARS-CoV-2 by FDA under an Emergency Use Authorization (EUA). This EUA will remain  in effect (meaning this test can be used) for the duration of the COVID-19 declaration under Section 564(b)(1) of the Act, 21 U.S.C.section 360bbb-3(b)(1), unless the authorization is terminated  or revoked sooner.       Influenza A by PCR NEGATIVE NEGATIVE Final   Influenza B by PCR NEGATIVE NEGATIVE Final    Comment: (NOTE) The Xpert Xpress SARS-CoV-2/FLU/RSV plus assay is intended as an aid in the diagnosis of influenza from Nasopharyngeal swab specimens and should not be used as a sole basis for treatment. Nasal washings and aspirates are unacceptable for Xpert Xpress SARS-CoV-2/FLU/RSV testing.  Fact Sheet for Patients: EntrepreneurPulse.com.au  Fact Sheet for Healthcare Providers: IncredibleEmployment.be  This test is not yet  approved or cleared by the [place] and has been authorized for detection and/or diagnosis of SARS-CoV-2 by FDA under an Emergency Use Authorization (EUA). This EUA will remain in effect (meaning this test can be used) for the duration of the COVID-19 declaration under Section 564(b)(1) of the Act, 21 U.S.C. section 360bbb-3(b)(1), unless the authorization is terminated or revoked.  Performed at [facility], [place] [address], [place], [place] [address]   Urine Culture     Status: None   Collection Time: 09/22/2021  2:57 PM   Specimen: Urine, Random  Result Value Ref Range Status   Specimen Description   Final    URINE, RANDOM Performed at [facility] [address], [place], [place] [address]    Special Requests   Final    NONE Performed at [facility], [place]  [address] [place], [place] [address]    Culture   Final    NO GROWTH Performed at [facility], [place] [address], [place], [place] [address]    Report Status 09/12/2021 FINAL  Final  MRSA Next Gen by PCR, Nasal     Status: None   Collection Time: 09/21/2021  9:13 PM   Specimen: Nasal Mucosa; Nasal Swab  Result Value Ref Range Status   MRSA by PCR Next Gen NOT DETECTED NOT DETECTED Final    Comment: (NOTE) The GeneXpert MRSA Assay (FDA approved for NASAL specimens only), is one component of a comprehensive MRSA colonization surveillance program. It is not intended to diagnose MRSA infection nor to guide or monitor treatment for MRSA infections. Test performance is not FDA approved in patients less than 14 years old. Performed at [facility], [place] [address] [place], [place] [address]     Procedures and diagnostic studies:  ECHOCARDIOGRAM COMPLETE  Result Date: 09/14/2021    ECHOCARDIOGRAM REPORT   Patient Name:   CORRISA GIBBY Date of Exam: 09/14/2021 Medical Rec #:  [id number]        Height:       70.0 in Accession #:    [id number]       Weight:       164.5 lb Date of Birth:  04-16-1956       BSA:          1.921 m Patient Age:    61 years         BP:           121/65 mmHg Patient Gender: F                HR:           104 bpm. Exam Location:  Inpatient Procedure: 2D Echo, Cardiac Doppler and Color Doppler Indications:    Abnormal Ekg  History:        Patient has prior history of Echocardiogram examinations, most                 recent 01/06/2017. Risk Factors:Hypertension.  Sonographer:    Helmut Muster Referring Phys: [place]  1. Left ventricular ejection fraction, by estimation, is 55 to 60%. The left ventricle has normal function. The left ventricle has no regional wall motion abnormalities. Left ventricular diastolic parameters are consistent with Grade I diastolic dysfunction (impaired relaxation).  2. There is mild respirophasic  variation of interventricular septum position, possibly due to the patient's underlying lung disease. Right ventricular systolic function is normal. The right ventricular size  is normal.  3. The mitral valve is normal in structure. No evidence of mitral valve regurgitation. No evidence of mitral stenosis.  4. The aortic valve is tricuspid. Aortic valve regurgitation is not visualized. No aortic stenosis is present.  5. The inferior vena cava is dilated in size with <50% respiratory variability, suggesting right atrial pressure of 15 mmHg. FINDINGS  Left Ventricle: Left ventricular ejection fraction, by estimation, is 55 to 60%. The left ventricle has normal function. The left ventricle has no regional wall motion abnormalities. The left ventricular internal cavity size was normal in size. There is  no left ventricular hypertrophy. Left ventricular diastolic parameters are consistent with Grade I diastolic dysfunction (impaired relaxation). Right Ventricle: There is mild respirophasic variation of interventricular septum position, possibly due to the patient's underlying lung disease. The right ventricular size is normal. No increase in right ventricular wall thickness. Right ventricular systolic function is normal. Left Atrium: Left atrial size was normal in size. Right Atrium: Right atrial size was normal in size. Pericardium: There is no evidence of pericardial effusion. Mitral Valve: The mitral valve is normal in structure. No evidence of mitral valve regurgitation. No evidence of mitral valve stenosis. MV peak gradient, 4.8 mmHg. The mean mitral valve gradient is 3.0 mmHg. Tricuspid Valve: The tricuspid valve is normal in structure. Tricuspid valve regurgitation is not demonstrated. Aortic Valve: The aortic valve is tricuspid. Aortic valve regurgitation is not visualized. No aortic stenosis is present. Pulmonic Valve: The pulmonic valve was normal in structure. Pulmonic valve regurgitation is not visualized.  Aorta: The aortic root is normal in size and structure. Venous: The inferior vena cava is dilated in size with less than 50% respiratory variability, suggesting right atrial pressure of 15 mmHg. IAS/Shunts: No atrial level shunt detected by color flow Doppler.  LEFT VENTRICLE PLAX 2D LVIDd:         5.00 cm     Diastology LVIDs:         3.40 cm     LV e' medial:    10.90 cm/s LV PW:         0.70 cm     LV E/e' medial:  8.5 LV IVS:        0.70 cm     LV e' lateral:   12.70 cm/s LVOT diam:     2.00 cm     LV E/e' lateral: 7.3 LV SV:         72 LV SV Index:   37 LVOT Area:     3.14 cm  LV Volumes (MOD) LV vol d, MOD A2C: 70.3 ml LV vol d, MOD A4C: 57.6 ml LV vol s, MOD A2C: 28.1 ml LV vol s, MOD A4C: 24.0 ml LV SV MOD A2C:     42.2 ml LV SV MOD A4C:     57.6 ml LV SV MOD BP:      39.6 ml RIGHT VENTRICLE             IVC RV S prime:     20.90 cm/s  IVC diam: 2.60 cm TAPSE (M-mode): 3.0 cm LEFT ATRIUM             Index        RIGHT ATRIUM           Index LA diam:        2.70 cm 1.41 cm/m   RA Area:     11.10 cm LA Vol (A2C):   40.0 ml 20.82 ml/m  RA Volume:   25.00 ml  13.01 ml/m LA Vol (A4C):   57.4 ml 29.88 ml/m LA Biplane Vol: 52.8 ml 27.49 ml/m  AORTIC VALVE LVOT Vmax:   146.00 cm/s LVOT Vmean:  89.300 cm/s LVOT VTI:    0.229 m  AORTA Ao Root diam: 3.40 cm Ao Asc diam:  3.50 cm MITRAL VALVE MV Area (PHT): 5.13 cm    SHUNTS MV Area VTI:   3.38 cm    Systemic VTI:  0.23 m MV Peak grad:  4.8 mmHg    Systemic Diam: 2.00 cm MV Mean grad:  3.0 mmHg MV Vmax:       1.09 m/s MV Vmean:      83.9 cm/s MV Decel Time: 148 msec MV E velocity: 92.40 cm/s MV A velocity: 98.50 cm/s MV E/A ratio:  0.94 Dalton McleanMD Electronically signed by Franki Monte Signature Date/Time: 09/14/2021/6:10:19 PM    Final                LOS: 4 days   Mallori Araque  [facility]   Pager on [email or web address]. If 7PM-7AM, please contact night-coverage at [email or web address]     09/15/2021, 8:38 AM

## 2021-09-15 NOTE — Progress Notes (Signed)
Progress Note  Patient Name: Patricia Sampson Date of Encounter: 09/15/2021  [facility] Cardiologist:   Penne Rosenstock   Subjective   65 year old female with a history of COPD, chronic respiratory failure on 6 L O2, hypertension, hyperlipidemia, pulmonary hypertension and non-small cell lung cancer.  She developed rapid atrial fibrillation yesterday and we are asked to see her.  IV diltiazem resulted only in drop in her blood pressure and really did not slow her heart rate.  She was started on amiodarone.  Since yesterday she is done much better.  She converted to sinus rhythm after short run of amiodarone and has has maintained sinus rhythm.  She is sleeping comfortably in bed today.  I talked with her husband this morning.  Sinus rhythm around 100.  Inpatient Medications    Scheduled Meds:  chlorhexidine  15 mL Mouth Rinse BID   Chlorhexidine Gluconate Cloth  6 each Topical Daily   mouth rinse  15 mL Mouth Rinse q12n4p   Continuous Infusions:  sodium chloride 10 mL/hr at 09/13/21 0235   sodium chloride Stopped (09/14/21 0200)   acyclovir (ZOVIRAX) [phone] mg IVPB Stopped (09/15/21 0249)   amiodarone 30 mg/hr (09/15/21 0604)   ampicillin (OMNIPEN) IV Stopped (09/15/21 2878)   cefTRIAXone (ROCEPHIN)  IV Stopped (09/15/21 0114)   diltiazem (CARDIZEM) infusion Stopped (09/13/21 1817)   heparin 1,400 Units/hr (09/15/21 6767)   potassium chloride     vancomycin Stopped (09/15/21 0400)   PRN Meds: sodium chloride, acetaminophen **OR** acetaminophen, ondansetron **OR** ondansetron (ZOFRAN) IV   Vital Signs    Vitals:   09/15/21 0304 09/15/21 0400 09/15/21 0500 09/15/21 0600  BP:  113/60 111/71 116/70  Pulse: (!) 105 96 100 (!) 102  Resp: (!) _0 Temp:  97.9 F (36.6 C)    TempSrc:  Oral    SpO2: 96% 96% 91% 90%    Intake/Output Summary (Last 24 hours) at 09/15/2021 0743 Last data filed at 09/15/2021 2094 Gross per 24 hour  Intake 2421.25 ml  Output 400 ml  Net  2021.25 ml   Last 3 Weights 09/04/2021 09/04/2021 08/28/2021  Weight (lbs) 164 lb 8 oz (No Data) (No Data)  Weight (kg) 74.617 kg (No Data) (No Data)      Telemetry     - Personally Reviewed  ECG     - Personally Reviewed  Physical Exam   GEN: middle age female,  nad  Neck: No JVD Cardiac: RRR, no murmurs, rubs, or gallops.  Respiratory: Clear to auscultation bilaterally. GI: Soft, nontender, non-distended  MS: No edema; No deformity. Neuro:  Nonfocal  Psych: Normal affect   Labs    High Sensitivity Troponin:  No results for input(s): TROPONINIHS in the last 720 hours.   Chemistry Recent Labs  Lab 09/25/2021 1155 09/10/2021 1201 09/12/21 0248 09/14/21 0004 09/14/21 2141 09/15/21 0255  NA 135   < > 137 139 135 136  K 3.5   < > 3.4* 3.3* 3.0* 3.0*  CL 86*   < > 92* 97* 94* 93*  CO2 38*  --  33* 34* 30 33*  GLUCOSE 107*   < > 84 112* 109* 118*  BUN 22   < > _1 CREATININE 0.95   < > 0.63 0.74 0.57 0.62  CALCIUM 8.6*  --  8.3* 7.4* 7.9* 7.8*  MG  --   --   --  1.2* 1.9 1.7  PROT 7.1  --  6.2* 5.5*  --   --  ALBUMIN 2.7*  --  2.4* 2.2*  --   --   AST 17  --  14* 14*  --   --   ALT 10  --  10 9  --   --   ALKPHOS 73  --  72 62  --   --   BILITOT 0.6  --  0.6 0.5  --   --   GFRNONAA >60  --  >60 >60 >60 >60  ANIONGAP 11  --  _0 < > = values in this interval not displayed.    Lipids No results for input(s): CHOL, TRIG, HDL, LABVLDL, LDLCALC, CHOLHDL in the last 168 hours.  Hematology Recent Labs  Lab 09/13/21 0254 09/14/21 0004 09/15/21 0255  WBC 2.1* 2.9* 2.9*  RBC 3.25* 3.11* 3.07*  HGB 8.3* 7.8* 7.8*  HCT 28.4* 26.9* 26.4*  MCV 87.4 86.5 86.0  MCH 25.5* 25.1* 25.4*  MCHC 29.2* 29.0* 29.5*  RDW 18.6* 18.6* 18.8*  PLT 129* 133* 137*   Thyroid  Recent Labs  Lab 09/12/21 0855  TSH 2.466    BNPNo results for input(s): BNP, PROBNP in the last 168 hours.  DDimer No results for input(s): DDIMER in the last 168 hours.   Radiology     ECHOCARDIOGRAM COMPLETE  Result Date: 09/14/2021    ECHOCARDIOGRAM REPORT   Patient Name:   KHLOIE HAMADA Date of Exam: 09/14/2021 Medical Rec #:  [id number]        Height:       70.0 in Accession #:    [id number]       Weight:       164.5 lb Date of Birth:  07-Apr-1956       BSA:          1.921 m Patient Age:    67 years         BP:           121/65 mmHg Patient Gender: F                HR:           104 bpm. Exam Location:  Inpatient Procedure: 2D Echo, Cardiac Doppler and Color Doppler Indications:    Abnormal Ekg  History:        Patient has prior history of Echocardiogram examinations, most                 recent 01/06/2017. Risk Factors:Hypertension.  Sonographer:    Helmut Muster Referring Phys: [place]  1. Left ventricular ejection fraction, by estimation, is 55 to 60%. The left ventricle has normal function. The left ventricle has no regional wall motion abnormalities. Left ventricular diastolic parameters are consistent with Grade I diastolic dysfunction (impaired relaxation).  2. There is mild respirophasic variation of interventricular septum position, possibly due to the patient's underlying lung disease. Right ventricular systolic function is normal. The right ventricular size is normal.  3. The mitral valve is normal in structure. No evidence of mitral valve regurgitation. No evidence of mitral stenosis.  4. The aortic valve is tricuspid. Aortic valve regurgitation is not visualized. No aortic stenosis is present.  5. The inferior vena cava is dilated in size with <50% respiratory variability, suggesting right atrial pressure of 15 mmHg. FINDINGS  Left Ventricle: Left ventricular ejection fraction, by estimation, is 55 to 60%. The left ventricle has normal function. The left ventricle has no regional wall motion abnormalities. The left  ventricular internal cavity size was normal in size. There is  no left ventricular hypertrophy. Left ventricular diastolic parameters  are consistent with Grade I diastolic dysfunction (impaired relaxation). Right Ventricle: There is mild respirophasic variation of interventricular septum position, possibly due to the patient's underlying lung disease. The right ventricular size is normal. No increase in right ventricular wall thickness. Right ventricular systolic function is normal. Left Atrium: Left atrial size was normal in size. Right Atrium: Right atrial size was normal in size. Pericardium: There is no evidence of pericardial effusion. Mitral Valve: The mitral valve is normal in structure. No evidence of mitral valve regurgitation. No evidence of mitral valve stenosis. MV peak gradient, 4.8 mmHg. The mean mitral valve gradient is 3.0 mmHg. Tricuspid Valve: The tricuspid valve is normal in structure. Tricuspid valve regurgitation is not demonstrated. Aortic Valve: The aortic valve is tricuspid. Aortic valve regurgitation is not visualized. No aortic stenosis is present. Pulmonic Valve: The pulmonic valve was normal in structure. Pulmonic valve regurgitation is not visualized. Aorta: The aortic root is normal in size and structure. Venous: The inferior vena cava is dilated in size with less than 50% respiratory variability, suggesting right atrial pressure of 15 mmHg. IAS/Shunts: No atrial level shunt detected by color flow Doppler.  LEFT VENTRICLE PLAX 2D LVIDd:         5.00 cm     Diastology LVIDs:         3.40 cm     LV e' medial:    10.90 cm/s LV PW:         0.70 cm     LV E/e' medial:  8.5 LV IVS:        0.70 cm     LV e' lateral:   12.70 cm/s LVOT diam:     2.00 cm     LV E/e' lateral: 7.3 LV SV:         72 LV SV Index:   37 LVOT Area:     3.14 cm  LV Volumes (MOD) LV vol d, MOD A2C: 70.3 ml LV vol d, MOD A4C: 57.6 ml LV vol s, MOD A2C: 28.1 ml LV vol s, MOD A4C: 24.0 ml LV SV MOD A2C:     42.2 ml LV SV MOD A4C:     57.6 ml LV SV MOD BP:      39.6 ml RIGHT VENTRICLE             IVC RV S prime:     20.90 cm/s  IVC diam: 2.60 cm TAPSE  (M-mode): 3.0 cm LEFT ATRIUM             Index        RIGHT ATRIUM           Index LA diam:        2.70 cm 1.41 cm/m   RA Area:     11.10 cm LA Vol (A2C):   40.0 ml 20.82 ml/m  RA Volume:   25.00 ml  13.01 ml/m LA Vol (A4C):   57.4 ml 29.88 ml/m LA Biplane Vol: 52.8 ml 27.49 ml/m  AORTIC VALVE LVOT Vmax:   146.00 cm/s LVOT Vmean:  89.300 cm/s LVOT VTI:    0.229 m  AORTA Ao Root diam: 3.40 cm Ao Asc diam:  3.50 cm MITRAL VALVE MV Area (PHT): 5.13 cm    SHUNTS MV Area VTI:   3.38 cm    Systemic VTI:  0.23 m MV Peak grad:  4.8  mmHg    Systemic Diam: 2.00 cm MV Mean grad:  3.0 mmHg MV Vmax:       1.09 m/s MV Vmean:      83.9 cm/s MV Decel Time: 148 msec MV E velocity: 92.40 cm/s MV A velocity: 98.50 cm/s MV E/A ratio:  0.94 Dalton McleanMD Electronically signed by Franki Monte Signature Date/Time: 09/14/2021/6:10:19 PM    Final     Cardiac Studies      Patient Profile     65 y.o. female with COPD, non-small cell lung cancer, pulmonary hypertension who now has been found to have paroxysmal A. fib.  Assessment & Plan    .  Paroxysmal atrial fibrillation: She is converted back to sinus rhythm and has maintained sinus rhythm.  At this point I would agree that she does not need IV amiodarone.  I think we should consider a short course of oral amiodarone for perhaps a month or so.  This would pose minimal risk of developing amiodarone toxicity in worsening lung disease but may keep her in sinus rhythm and keep her feeling well.  We can certainly stop the amiodarone if she does not have any recurrent episodes of atrial fibrillation.  There is also the question of how long to continue with anticoagulation.  Her atrial fibrillation was in the setting of SIRS and ICU admission.  Her CHADS2 VASC score  is 2.  There is no evidence of hemoptysis or GI bleeding. She has pancytopenia and her hemoglobin has been stable.  I would favor continuing her on Eliquis 5 mg twice a day what she is able to take  medications by mouth.  If there is any evidence of worsening anemia or any evidence of bleeding then we should stop the anticoagulation.      For questions or updates, please contact [place] Please consult [email or web address] for contact info under        Signed, Mertie Moores, MD  09/15/2021, 7:43 AM

## 2021-09-15 NOTE — Progress Notes (Signed)
ANTICOAGULATION CONSULT NOTE - follow up  Pharmacy Consult for heparin Indication: atrial fibrillation  Allergies  Allergen Reactions   Dyazide [Hydrochlorothiazide W-Triamterene]     hives   Zestoretic [Lisinopril-Hydrochlorothiazide]     Swelling     Patient Measurements:   Heparin Dosing Weight: 74.6 kg  Vital Signs: Temp: 98.3 F (36.8 C) (10/20 2330) Temp Source: Oral (10/20 2330) BP: 108/60 (10/20 2300) Pulse Rate: 93 (10/21 0100)  Labs: Recent Labs    09/13/21 0254 09/13/21 0254 09/14/21 0004 09/14/21 0744 09/14/21 1709 09/14/21 2141 09/15/21 0255  HGB 8.3*  --  7.8*  --   --   --  7.8*  HCT 28.4*  --  26.9*  --   --   --  26.4*  PLT 129*  --  133*  --   --   --  137*  HEPARINUNFRC  --    < > 0.20* 0.26* 0.35  --  0.55  CREATININE  --   --  0.74  --   --  0.57 0.62   < > = values in this interval not displayed.     Estimated Creatinine Clearance: 77.8 mL/min (by C-G formula based on SCr of 0.62 mg/dL).   Medical History: Past Medical History:  Diagnosis Date   Angioedema    COPD (chronic obstructive pulmonary disease) ([place])    FHx: migraine headaches    Hyperlipidemia    Hypertension    Seasonal allergies     Medications:  Scheduled:   chlorhexidine  15 mL Mouth Rinse BID   Chlorhexidine Gluconate Cloth  6 each Topical Daily   mouth rinse  15 mL Mouth Rinse q12n4p   Infusions:   sodium chloride 10 mL/hr at 09/13/21 0235   sodium chloride Stopped (09/14/21 0200)   acyclovir (ZOVIRAX) [phone] mg IVPB Stopped (09/15/21 0249)   amiodarone 30 mg/hr (09/14/21 1844)   ampicillin (OMNIPEN) IV Stopped (09/15/21 0249)   cefTRIAXone (ROCEPHIN)  IV Stopped (09/15/21 0114)   diltiazem (CARDIZEM) infusion Stopped (09/13/21 1817)   heparin 1,400 Units/hr (09/14/21 1200)   potassium chloride 10 mEq (09/15/21 0359)   vancomycin Stopped (09/15/21 0400)    Assessment: 65 yo female with new atrial fibrillation/flutter.  No history of anticoagulation  PTA and none received this admit.  Patient case discussed with cardiology who recommended starting patient on heparin IV.  Baseline aPTT wnl at 32 seconds and INR 1.1.  Patient is pancytopenic likely due to chemo/radiation therapy for lung adenocarcinoma.  Pharmacy consulted to dose heparin.    Today, 09/15/21 HL 0.55, therapeutic on 1250 units/hr -Hgb down from 10 >7.8, plts up to 137 from 111 -No s/sx bleeding per RN   Goal of Therapy:  Heparin level 0.3-0.7 units/ml Monitor platelets by anticoagulation protocol: Yes   Plan:  Continue heparin infusion at 1250 untis/hr Daily HL/CBC Monitor for s/sx bleeding F/u ability to transition to oral anticoagulant  Dolly Rias RPh 09/15/2021, 4:16 AM

## 2021-09-15 NOTE — Progress Notes (Signed)
ANTICOAGULATION CONSULT NOTE - follow up  Pharmacy Consult for heparin Indication: atrial fibrillation  Allergies  Allergen Reactions   Dyazide [Hydrochlorothiazide W-Triamterene]     hives   Zestoretic [Lisinopril-Hydrochlorothiazide]     Swelling     Patient Measurements:   Heparin Dosing Weight: 74.6 kg  Vital Signs: Temp: 97.4 F (36.3 C) (10/21 1200) Temp Source: Axillary (10/21 1200) BP: 114/66 (10/21 1500) Pulse Rate: 97 (10/21 1500)  Labs: Recent Labs    09/13/21 0254 09/13/21 0254 09/14/21 0004 09/14/21 0744 09/14/21 1709 09/14/21 2141 09/15/21 0255 09/15/21 1443  HGB 8.3*  --  7.8*  --   --   --  7.8*  --   HCT 28.4*  --  26.9*  --   --   --  26.4*  --   PLT 129*  --  133*  --   --   --  137*  --   HEPARINUNFRC  --   --  0.20*   < > 0.35  --  0.55 0.50  CREATININE  --    < > 0.74  --   --  0.57 0.62 0.55   < > = values in this interval not displayed.     Estimated Creatinine Clearance: 77.8 mL/min (by C-G formula based on SCr of 0.55 mg/dL).   Medical History: Past Medical History:  Diagnosis Date   Angioedema    COPD (chronic obstructive pulmonary disease) ([place])    FHx: migraine headaches    Hyperlipidemia    Hypertension    Seasonal allergies     Medications:  Scheduled:   chlorhexidine  15 mL Mouth Rinse BID   Chlorhexidine Gluconate Cloth  6 each Topical Daily   mouth rinse  15 mL Mouth Rinse q12n4p   Infusions:   sodium chloride 10 mL/hr at 09/15/21 1400   sodium chloride Stopped (09/14/21 0200)   acyclovir (ZOVIRAX) [phone] mg IVPB Stopped (09/15/21 1538)   amiodarone 30 mg/hr (09/15/21 1400)   ampicillin (OMNIPEN) IV Stopped (09/15/21 1254)   cefTRIAXone (ROCEPHIN)  IV Stopped (09/15/21 1105)   diltiazem (CARDIZEM) infusion Stopped (09/13/21 1817)   heparin 1,400 Units/hr (09/15/21 1400)   vancomycin Stopped (09/15/21 1224)    Assessment: 65 yo female with new atrial fibrillation/flutter.  No history of anticoagulation  PTA and none received this admit.  Patient case discussed with cardiology who recommended starting patient on heparin IV.  Baseline aPTT wnl at 32 seconds and INR 1.1.  Patient is pancytopenic likely due to chemo/radiation therapy for lung adenocarcinoma.  Pharmacy consulted to dose heparin.    Today, 09/15/21 -Confirmatory heparin level drawn this afternoon due to IV being discontinued at some point during the night last night -HL therapeutic at 0.50 on heparin 1250 units/hr -CBC stable -No s/sx bleeding reported, no further issues with line running  Goal of Therapy:  Heparin level 0.3-0.7 units/ml Monitor platelets by anticoagulation protocol: Yes   Plan:  Continue heparin infusion at 1250 units/hr Daily HL/CBC Monitor for s/sx bleeding F/u ability to transition to oral anticoagulant  Dimple Nanas, PharmD 09/15/2021 3:44 PM

## 2021-09-15 NOTE — Consult Note (Addendum)
[place] for Infectious Disease    Date of Admission:  09/10/2021     Reason for Consult: altered mental status     Referring Physician: Dr Mal Misty  Current antibiotics: Acyclovir 10/18-pres Ampicillin 10/18-pres Ceftriaxone 10/19-pres Vancomycin 10/17-pres  Previous antibiotics: Cefepime 10/17-10/18 Metronidazole 10/17-10/18   ASSESSMENT:    65 y.o. female admitted with:  Encephalopathy: with concern for infectious etiology given presentation with sepsis physiology and fevers earlier this admission.  Her husband and son report that she may be some better this afternoon than when she was first admitted from a mental status perspective.  Work up thus far unrevealing but she has been unable to get LP for further evaluation.  Other causes being considered are agitated delirium, toxic metabolic encephalopathy, respiratory acidosis, or ? leptomeningeal carcinomatosis Stage IIIa non-small cell lung cancer: receiving chemoradiation as outpatient Severe COPD with acute on chronic hypoxic respiratory failure Atrial fibrillation/flutter with RVR Pancytopenia  RECOMMENDATIONS:    Unclear if infectious etiology to her presentation but reasonable to continue treating as such for now Ideally would obtain LP to further help with ruling in or ruling out certain diagnosis, however, this is currently prohibitive If she does not require sedation/intubation then family would tentatively agree to LP  If unable to get LP in next day or so will start to de-escalate therapy.  Likely ampicillin while maintaining her on ceftriaxone, acyclovir Will stop her vanco today since unlikely to have resistant strep pneumonia and MRSA nares PCR is negative Will follow   Principal Problem:   SIRS (systemic inflammatory response syndrome) (HCC) Active Problems:   COPD (chronic obstructive pulmonary disease) (HCC)   Chronic respiratory failure with hypoxia (HCC)   Altered mental  status   MEDICATIONS:    Scheduled Meds: . chlorhexidine  15 mL Mouth Rinse BID  . Chlorhexidine Gluconate Cloth  6 each Topical Daily  . mouth rinse  15 mL Mouth Rinse q12n4p   Continuous Infusions: . sodium chloride 10 mL/hr at 09/15/21 1400  . sodium chloride Stopped (09/14/21 0200)  . acyclovir (ZOVIRAX) [phone] mg IVPB 745 mg (09/15/21 1438)  . amiodarone 30 mg/hr (09/15/21 1400)  . ampicillin (OMNIPEN) IV Stopped (09/15/21 1254)  . cefTRIAXone (ROCEPHIN)  IV Stopped (09/15/21 1105)  . diltiazem (CARDIZEM) infusion Stopped (09/13/21 1817)  . heparin 1,400 Units/hr (09/15/21 1400)  . vancomycin Stopped (09/15/21 1224)   PRN Meds:.sodium chloride, acetaminophen **OR** acetaminophen, ondansetron **OR** ondansetron (ZOFRAN) IV  HPI:    GEANINE VANDEKAMP is a 65 y.o. female with PMHx of COPD, chronic respiratory failure on 6 liters oxygen, HTN, HLD, prior tobacco use, and recently diagnosed stage IIIa non-small cell lung cancer in the left upper lobe and currently on chemotherapy.  She presented on 09/20/2021 with encephalopathy.  Her presentation was felt to be most likely due to toxic metabolic vs infectious etiology of unknown source as she met sepsis criteria on admission and has been febrile with Tmax 101.7 on HD#2.  Work up thus far has not revealed an infectious etiology but she remains on broad spectrum antibiotic coverage and antiviral coverage as well. Unfortunately, obtaining an LP might require intubation and sedation which the family does not want to risk so this has not been pursued.  We are consulted for further recommendations.    Past Medical History:  Diagnosis Date  . Angioedema   . COPD (chronic obstructive pulmonary disease) ([place])   . FHx: migraine headaches   . Hyperlipidemia   . Hypertension   .  Seasonal allergies     Social History   Tobacco Use  . Smoking status: Former    Packs/day: 1.00    Years: 30.00    Pack years: 30.00    Types: Cigarettes     Quit date: 11/26/2000    Years since quitting: 20.8  . Smokeless tobacco: Never  Substance Use Topics  . Alcohol use: No  . Drug use: No    Family History  Problem Relation Age of Onset  . Diabetes Mellitus II Father   . Coronary artery disease Mother   . Hypertension Mother   . COPD Mother     Allergies  Allergen Reactions  . Dyazide [Hydrochlorothiazide W-Triamterene]     hives  . Zestoretic [Lisinopril-Hydrochlorothiazide]     Swelling     Review of Systems  Unable to perform ROS: Mental status change   OBJECTIVE:   Blood pressure 117/65, pulse 98, temperature (!) 97.4 F (36.3 C), temperature source Axillary, resp. rate (!) 27, SpO2 91 %. There is no height or weight on file to calculate BMI.  Physical Exam Constitutional:      Comments: Ill appearing woman, lying in bed, NAD.  HENT:     Head: Normocephalic and atraumatic.  Eyes:     Extraocular Movements: Extraocular movements intact.     Conjunctiva/sclera: Conjunctivae normal.  Cardiovascular:     Rate and Rhythm: Normal rate.  Pulmonary:     Effort: Pulmonary effort is normal. No respiratory distress.     Comments: She is on nasal cannula and breath sounds are coarse, diminished at the bases.  Abdominal:     General: There is no distension.     Palpations: Abdomen is soft.     Tenderness: There is no abdominal tenderness. There is no guarding or rebound.  Musculoskeletal:        General: Normal range of motion.     Cervical back: Normal range of motion and neck supple.  Skin:    General: Skin is warm and dry.     Findings: No rash.  Neurological:     General: No focal deficit present.     Cranial Nerves: No cranial nerve deficit.     Comments: She does not respond verbally to questions She does follow commands     Lab Results: Lab Results  Component Value Date   WBC 2.9 (L) 09/15/2021   HGB 7.8 (L) 09/15/2021   HCT 26.4 (L) 09/15/2021   MCV 86.0 09/15/2021   PLT 137 (L) 09/15/2021    Lab  Results  Component Value Date   NA 136 09/15/2021   K 3.0 (L) 09/15/2021   CO2 33 (H) 09/15/2021   GLUCOSE 118 (H) 09/15/2021   BUN 10 09/15/2021   CREATININE 0.62 09/15/2021   CALCIUM 7.8 (L) 09/15/2021   GFRNONAA >60 09/15/2021    Lab Results  Component Value Date   ALT 9 09/14/2021   AST 14 (L) 09/14/2021   ALKPHOS 62 09/14/2021   BILITOT 0.5 09/14/2021    No results found for: CRP  No results found for: ESRSEDRATE  I have reviewed the micro and lab results in Epic.  Imaging: ECHOCARDIOGRAM COMPLETE  Result Date: 09/14/2021    ECHOCARDIOGRAM REPORT   Patient Name:   RON JUNCO Date of Exam: 09/14/2021 Medical Rec #:  [id number]        Height:       70.0 in Accession #:    [id number]       Weight:  164.5 lb Date of Birth:  1956-06-25       BSA:          1.921 m Patient Age:    72 years         BP:           121/65 mmHg Patient Gender: F                HR:           104 bpm. Exam Location:  Inpatient Procedure: 2D Echo, Cardiac Doppler and Color Doppler Indications:    Abnormal Ekg  History:        Patient has prior history of Echocardiogram examinations, most                 recent 01/06/2017. Risk Factors:Hypertension.  Sonographer:    Helmut Muster Referring Phys: [place]  1. Left ventricular ejection fraction, by estimation, is 55 to 60%. The left ventricle has normal function. The left ventricle has no regional wall motion abnormalities. Left ventricular diastolic parameters are consistent with Grade I diastolic dysfunction (impaired relaxation).  2. There is mild respirophasic variation of interventricular septum position, possibly due to the patient's underlying lung disease. Right ventricular systolic function is normal. The right ventricular size is normal.  3. The mitral valve is normal in structure. No evidence of mitral valve regurgitation. No evidence of mitral stenosis.  4. The aortic valve is tricuspid. Aortic valve regurgitation is  not visualized. No aortic stenosis is present.  5. The inferior vena cava is dilated in size with <50% respiratory variability, suggesting right atrial pressure of 15 mmHg. FINDINGS  Left Ventricle: Left ventricular ejection fraction, by estimation, is 55 to 60%. The left ventricle has normal function. The left ventricle has no regional wall motion abnormalities. The left ventricular internal cavity size was normal in size. There is  no left ventricular hypertrophy. Left ventricular diastolic parameters are consistent with Grade I diastolic dysfunction (impaired relaxation). Right Ventricle: There is mild respirophasic variation of interventricular septum position, possibly due to the patient's underlying lung disease. The right ventricular size is normal. No increase in right ventricular wall thickness. Right ventricular systolic function is normal. Left Atrium: Left atrial size was normal in size. Right Atrium: Right atrial size was normal in size. Pericardium: There is no evidence of pericardial effusion. Mitral Valve: The mitral valve is normal in structure. No evidence of mitral valve regurgitation. No evidence of mitral valve stenosis. MV peak gradient, 4.8 mmHg. The mean mitral valve gradient is 3.0 mmHg. Tricuspid Valve: The tricuspid valve is normal in structure. Tricuspid valve regurgitation is not demonstrated. Aortic Valve: The aortic valve is tricuspid. Aortic valve regurgitation is not visualized. No aortic stenosis is present. Pulmonic Valve: The pulmonic valve was normal in structure. Pulmonic valve regurgitation is not visualized. Aorta: The aortic root is normal in size and structure. Venous: The inferior vena cava is dilated in size with less than 50% respiratory variability, suggesting right atrial pressure of 15 mmHg. IAS/Shunts: No atrial level shunt detected by color flow Doppler.  LEFT VENTRICLE PLAX 2D LVIDd:         5.00 cm     Diastology LVIDs:         3.40 cm     LV e' medial:    10.90  cm/s LV PW:         0.70 cm     LV E/e' medial:  8.5 LV IVS:  0.70 cm     LV e' lateral:   12.70 cm/s LVOT diam:     2.00 cm     LV E/e' lateral: 7.3 LV SV:         72 LV SV Index:   37 LVOT Area:     3.14 cm  LV Volumes (MOD) LV vol d, MOD A2C: 70.3 ml LV vol d, MOD A4C: 57.6 ml LV vol s, MOD A2C: 28.1 ml LV vol s, MOD A4C: 24.0 ml LV SV MOD A2C:     42.2 ml LV SV MOD A4C:     57.6 ml LV SV MOD BP:      39.6 ml RIGHT VENTRICLE             IVC RV S prime:     20.90 cm/s  IVC diam: 2.60 cm TAPSE (M-mode): 3.0 cm LEFT ATRIUM             Index        RIGHT ATRIUM           Index LA diam:        2.70 cm 1.41 cm/m   RA Area:     11.10 cm LA Vol (A2C):   40.0 ml 20.82 ml/m  RA Volume:   25.00 ml  13.01 ml/m LA Vol (A4C):   57.4 ml 29.88 ml/m LA Biplane Vol: 52.8 ml 27.49 ml/m  AORTIC VALVE LVOT Vmax:   146.00 cm/s LVOT Vmean:  89.300 cm/s LVOT VTI:    0.229 m  AORTA Ao Root diam: 3.40 cm Ao Asc diam:  3.50 cm MITRAL VALVE MV Area (PHT): 5.13 cm    SHUNTS MV Area VTI:   3.38 cm    Systemic VTI:  0.23 m MV Peak grad:  4.8 mmHg    Systemic Diam: 2.00 cm MV Mean grad:  3.0 mmHg MV Vmax:       1.09 m/s MV Vmean:      83.9 cm/s MV Decel Time: 148 msec MV E velocity: 92.40 cm/s MV A velocity: 98.50 cm/s MV E/A ratio:  0.94 Dalton McleanMD Electronically signed by Franki Monte Signature Date/Time: 09/14/2021/6:10:19 PM    Final    [place] EKG SITE RITE  Result Date: 09/15/2021 If Site Rite image not attached, placement could not be confirmed due to current cardiac rhythm.    Imaging independently reviewed in Epic.  Rayn[facility] for Infectious Disease [facility] [phone] pager 09/15/2021, 2:58 PM

## 2021-09-15 NOTE — Progress Notes (Signed)
   Recent Labs  Lab 09/05/2021 1201 09/12/2021 1745 09/15/21 1225  PHART  --  7.444 7.227*  PCO2ART  --  54.2* 90.6*  PO2ART  --  35.2* 97.5  HCO3  --  36.6* 36.3*  TCO2 37*  --   --   O2SAT  --  66.2 97.3    ABG significantly worse but acute on chronic respiratory acidosis.  This could be contributing to some of the encephalopathy  Plan - We started BiPAP earlier and this is to continue through the night -Later on she got some agitation.  We will give 3 mg Haldol x1 if QTC is less than 5 ms - Avoid opioids and benzos to the extent possible [currently on none] -Precedex if needed -Track ABG     SIGNATURE    Dr. Brand Males, M.D., F.C.C.P,  Pulmonary and Critical Care Medicine Staff Physician, [facility] Director - Interstitial Lung Disease  Program  Pulmonary [place] at [place], [place], [address]  NPI Number:  NPI #[id number]  Pager: [phone], If no answer  -> Check AMION or Try [phone] Telephone (clinical office): [phone] Telephone (research): [phone]  3:20 PM 09/15/2021

## 2021-09-15 NOTE — Progress Notes (Signed)
Brief Progress Note CXR results show Interval development of bibasilar patchy airspace opacities which may represent a combination of infection/inflammation and/or atelectasis. Trace right pleural effusion. Possible trace left pleural effusion. Left upper lobe airspace opacity consistent with known primary bronchogenic carcinoma better evaluated on CT angiography chest 09/07/2021. BiPAP should help re-recruit with positive pressure.  Will need aggressive Pulmonary Toilet / IS when she is off BiPAP  Repeat ABG is ordered for 1700 today to ensure CO2 of 90 is responding to therapy.   Magdalen Spatz, MSN, AGACNP-BC [place] for personal pager PCCM on call pager [phone]  09/15/2021 3:50 PM

## 2021-09-16 DIAGNOSIS — R410 Disorientation, unspecified: Secondary | ICD-10-CM | POA: Diagnosis not present

## 2021-09-16 DIAGNOSIS — C3492 Malignant neoplasm of unspecified part of left bronchus or lung: Secondary | ICD-10-CM

## 2021-09-16 DIAGNOSIS — Z515 Encounter for palliative care: Secondary | ICD-10-CM

## 2021-09-16 DIAGNOSIS — J449 Chronic obstructive pulmonary disease, unspecified: Secondary | ICD-10-CM | POA: Diagnosis not present

## 2021-09-16 DIAGNOSIS — I480 Paroxysmal atrial fibrillation: Secondary | ICD-10-CM | POA: Diagnosis not present

## 2021-09-16 DIAGNOSIS — J9611 Chronic respiratory failure with hypoxia: Secondary | ICD-10-CM | POA: Diagnosis not present

## 2021-09-16 DIAGNOSIS — Z7189 Other specified counseling: Secondary | ICD-10-CM | POA: Diagnosis not present

## 2021-09-16 DIAGNOSIS — R4182 Altered mental status, unspecified: Secondary | ICD-10-CM | POA: Diagnosis not present

## 2021-09-16 DIAGNOSIS — R6510 Systemic inflammatory response syndrome (SIRS) of non-infectious origin without acute organ dysfunction: Secondary | ICD-10-CM | POA: Diagnosis not present

## 2021-09-16 LAB — CBC WITH DIFFERENTIAL/PLATELET
Abs Immature Granulocytes: 0.09 10*3/uL — ABNORMAL HIGH (ref 0.00–0.07)
Basophils Absolute: 0 10*3/uL (ref 0.0–0.1)
Basophils Relative: 0 %
Eosinophils Absolute: 0 10*3/uL (ref 0.0–0.5)
Eosinophils Relative: 0 %
HCT: 25.2 % — ABNORMAL LOW (ref 36.0–46.0)
Hemoglobin: 7.4 g/dL — ABNORMAL LOW (ref 12.0–15.0)
Immature Granulocytes: 4 %
Lymphocytes Relative: 9 %
Lymphs Abs: 0.2 10*3/uL — ABNORMAL LOW (ref 0.7–4.0)
MCH: 25.7 pg — ABNORMAL LOW (ref 26.0–34.0)
MCHC: 29.4 g/dL — ABNORMAL LOW (ref 30.0–36.0)
MCV: 87.5 fL (ref 80.0–100.0)
Monocytes Absolute: 0.3 10*3/uL (ref 0.1–1.0)
Monocytes Relative: 13 %
Neutro Abs: 1.8 10*3/uL (ref 1.7–7.7)
Neutrophils Relative %: 74 %
Platelets: 138 10*3/uL — ABNORMAL LOW (ref 150–400)
RBC: 2.88 MIL/uL — ABNORMAL LOW (ref 3.87–5.11)
RDW: 18.6 % — ABNORMAL HIGH (ref 11.5–15.5)
WBC: 2.4 10*3/uL — ABNORMAL LOW (ref 4.0–10.5)
nRBC: 0 % (ref 0.0–0.2)

## 2021-09-16 LAB — BASIC METABOLIC PANEL
Anion gap: 6 (ref 5–15)
BUN: 7 mg/dL — ABNORMAL LOW (ref 8–23)
CO2: 37 mmol/L — ABNORMAL HIGH (ref 22–32)
Calcium: 8.2 mg/dL — ABNORMAL LOW (ref 8.9–10.3)
Chloride: 91 mmol/L — ABNORMAL LOW (ref 98–111)
Creatinine, Ser: 0.55 mg/dL (ref 0.44–1.00)
GFR, Estimated: >60 mL/min (ref >60)
Glucose, Bld: 116 mg/dL — ABNORMAL HIGH (ref 70–99)
Potassium: 3.1 mmol/L — ABNORMAL LOW (ref 3.5–5.1)
Sodium: 134 mmol/L — ABNORMAL LOW (ref 135–145)

## 2021-09-16 LAB — CULTURE, BLOOD (ROUTINE X 2)
Culture: NO GROWTH
Culture: NO GROWTH
Special Requests: ADEQUATE

## 2021-09-16 LAB — VITAMIN B1: Vitamin B1 (Thiamine): 46.7 nmol/L — ABNORMAL LOW (ref 66.5–200.0)

## 2021-09-16 LAB — HEPARIN LEVEL (UNFRACTIONATED): Heparin Unfractionated: 0.63 IU/mL (ref 0.30–0.70)

## 2021-09-16 LAB — MAGNESIUM: Magnesium: 1.9 mg/dL (ref 1.7–2.4)

## 2021-09-16 MED ORDER — SODIUM CHLORIDE 0.9% FLUSH: 10.0000 mL | INTRAVENOUS | Status: DC | PRN

## 2021-09-16 MED ORDER — MORPHINE SULFATE (PF) 2 MG/ML IV SOLN
2.0000 mg | Freq: Once | INTRAVENOUS | Status: AC
  Administered 2021-09-16: 2 mg via INTRAVENOUS
  Filled 2021-09-16: qty 1

## 2021-09-16 MED ORDER — IPRATROPIUM-ALBUTEROL 0.5-2.5 (3) MG/3ML IN SOLN
3.0000 mL | RESPIRATORY_TRACT | Status: DC | PRN
  Administered 2021-09-16 – 2021-09-18 (×6): 3 mL via RESPIRATORY_TRACT
  Filled 2021-09-16 (×5): qty 3

## 2021-09-16 MED ORDER — MAGNESIUM SULFATE 2 GM/50ML IV SOLN
2.0000 g | Freq: Once | INTRAVENOUS | Status: AC
  Administered 2021-09-16: 2 g via INTRAVENOUS
  Filled 2021-09-16: qty 50

## 2021-09-16 MED ORDER — IPRATROPIUM-ALBUTEROL 0.5-2.5 (3) MG/3ML IN SOLN
3.0000 mL | Freq: Four times a day (QID) | RESPIRATORY_TRACT | Status: DC | PRN
  Administered 2021-09-16: 3 mL via RESPIRATORY_TRACT
  Filled 2021-09-16: qty 3

## 2021-09-16 MED ORDER — IPRATROPIUM-ALBUTEROL 0.5-2.5 (3) MG/3ML IN SOLN
3.0000 mL | Freq: Three times a day (TID) | RESPIRATORY_TRACT | Status: DC
  Administered 2021-09-16 – 2021-09-18 (×6): 3 mL via RESPIRATORY_TRACT
  Filled 2021-09-16 (×6): qty 3

## 2021-09-16 MED ORDER — SODIUM CHLORIDE 0.9% FLUSH
10.0000 mL | Freq: Two times a day (BID) | INTRAVENOUS | Status: DC
  Administered 2021-09-17: 20 mL
  Administered 2021-09-17: 10 mL
  Administered 2021-09-18: 20 mL
  Administered 2021-09-18 – 2021-09-19 (×3): 30 mL
  Administered 2021-09-20 (×2): 10 mL
  Administered 2021-09-21: 20 mL
  Administered 2021-09-21: 10 mL
  Administered 2021-09-22 (×2): 30 mL
  Administered 2021-09-23 – 2021-09-27 (×6): 10 mL
  Administered 2021-09-28: 40 mL
  Administered 2021-09-29: 30 mL
  Administered 2021-09-29 – 2021-09-30 (×2): 10 mL
  Administered 2021-09-30: 30 mL
  Administered 2021-10-01: 20 mL
  Administered 2021-10-02 – 2021-10-04 (×4): 10 mL
  Administered 2021-10-05: 20 mL
  Administered 2021-10-05: 10 mL
  Administered 2021-10-06: 20 mL
  Administered 2021-10-07 – 2021-10-10 (×7): 10 mL

## 2021-09-16 MED ORDER — POTASSIUM CHLORIDE 10 MEQ/100ML IV SOLN
10.0000 meq | INTRAVENOUS | Status: AC
  Administered 2021-09-16 (×6): 10 meq via INTRAVENOUS
  Filled 2021-09-16 (×6): qty 100

## 2021-09-16 NOTE — Consult Note (Signed)
Consultation Note Date: 09/16/2021   Patient Name: Patricia Sampson  DOB: 1956-08-21  MRN: [id number]  Age / Sex: 65 y.o., female  PCP: Greig Right, MD Referring Physician: Flora Lipps, MD  Reason for Consultation: Establishing goals of care  HPI/Patient Profile: 65 y.o. female  with past medical history of severe COPD, chronic hypoxic respiratory failure on 4-8 L, hypertension, hyperlipidemia with recent diagnosis of stage III adenocarcinoma with large left upper lobe mass and chest wall invasion in August of this year who is currently undergoing chemoradiation admitted on 08/28/2021 with altered mental status with concern for sepsis.  Her mental status over the past several days has remained poor and far from her baseline.  Palliative consulted for goals of care.  Clinical Assessment and Goals of Care: I met today with Patricia Sampson.   I introduced palliative care as specialized medical care for people living with serious illness. It focuses on providing relief from the symptoms and stress of a serious illness. The goal is to improve quality of life for both the patient and the family.  She tells me that she is married and lives in [place].  She has a son and a daughter.  Her faith and her family are the most important things to her.  We discussed clinical course this admission.  While she is certainly much more awake and alert today versus prior documentation, I am not sure she really understands everything that is been going on with her condition, particularly events of the past several days.    We discussed about advance care planning and why it would be helpful to understand her goals moving forward and also to understand he would be important to help make decisions on her behalf if she is unable to make them for herself.  She agrees this would be beneficial and would like to wait until her family is  present to discuss.  When I asked, she told me to call her son to set up meeting with family.   Questions and concerns addressed.   PMT will continue to support holistically.   SUMMARY OF RECOMMENDATIONS   -Full code/full scope -Patricia Sampson mental status appears to be much better today than has been documented for the last several days.  She is awake and alert and answers simple questions appropriately.  She does not remember anything about the past few days.  She is open to further discussion about her situation and goals but feels it would be helpful for family to participate as well.   -I called and left a message for her son requesting return call to set up a family meeting.  Code Status/Advance Care Planning: Full code Additional Recommendations (Limitations, Scope, Preferences): Full Scope Treatment  Psycho-social/Spiritual:  Desire for further Chaplaincy support: Did not address today Additional Recommendations: Caregiving  Support/Resources  Prognosis:  Guarded  Discharge Planning: To Be Determined      Primary Diagnoses: Present on Admission:  SIRS (systemic inflammatory response syndrome) (HCC)  Chronic respiratory failure with hypoxia (HCC)  COPD (chronic obstructive pulmonary disease) ([place])   I have reviewed the medical record, interviewed the patient and family, and examined the patient. The following aspects are pertinent.  Past Medical History:  Diagnosis Date   Angioedema    COPD (chronic obstructive pulmonary disease) (HCC)    FHx: migraine headaches    Hyperlipidemia    Hypertension    Seasonal allergies    Social History   Socioeconomic History   Marital status: Married    Spouse name: Not on file   Number of children: Not on file   Years of education: Not on file   Highest education level: Not on file  Occupational History   Not on file  Tobacco Use   Smoking status: Former    Packs/day: 1.00    Years: 30.00    Pack years: 30.00    Types:  Cigarettes    Quit date: 11/26/2000    Years since quitting: 20.8   Smokeless tobacco: Never  Substance and Sexual Activity   Alcohol use: No   Drug use: No   Sexual activity: Not on file  Other Topics Concern   Not on file  Social History Narrative   Not on file   Social Determinants of Health   Financial Resource Strain: Not on file  Food Insecurity: Not on file  Transportation Needs: Not on file  Physical Activity: Not on file  Stress: Not on file  Social Connections: Not on file   Family History  Problem Relation Age of Onset   Diabetes Mellitus II Father    Coronary artery disease Mother    Hypertension Mother    COPD Mother    Scheduled Meds:  chlorhexidine  15 mL Mouth Rinse BID   Chlorhexidine Gluconate Cloth  6 each Topical Daily   ipratropium-albuterol  3 mL Nebulization TID   mouth rinse  15 mL Mouth Rinse q12n4p   Continuous Infusions:  sodium chloride 250 mL (09/16/21 1617)   sodium chloride Stopped (09/14/21 0200)   acyclovir (ZOVIRAX) [phone] mg IVPB Stopped (09/16/21 1434)   amiodarone 30 mg/hr (09/16/21 1700)   ampicillin (OMNIPEN) IV Stopped (09/16/21 1648)   cefTRIAXone (ROCEPHIN)  IV Stopped (09/16/21 1122)   heparin 1,400 Units/hr (09/16/21 1700)   PRN Meds:.sodium chloride, acetaminophen **OR** acetaminophen, ipratropium-albuterol, ondansetron **OR** ondansetron (ZOFRAN) IV Medications Prior to Admission:  Prior to Admission medications   Medication Sig Start Date End Date Taking? Authorizing Provider  acetaminophen (TYLENOL) 650 MG CR tablet Take 650-1,300 mg by mouth every 8 (eight) hours as needed for pain.   Yes [provider]  furosemide (LASIX) 20 MG tablet Take 20 mg by mouth daily. 05/18/21  Yes [provider]  gabapentin (NEURONTIN) 100 MG capsule Take 100 mg by mouth at bedtime. 07/17/21  Yes [provider]  Magnesium 250 MG TABS Take 250 mg by mouth daily.   Yes [provider]  potassium chloride  (MICRO-K) 10 MEQ CR capsule Take 10 mEq by mouth 2 (two) times daily.   Yes [provider]  prochlorperazine (COMPAZINE) 10 MG tablet Take 1 tablet (10 mg total) by mouth every 6 (six) hours as needed for nausea or vomiting. 07/19/21  Yes Curt Bears, MD  rosuvastatin (CRESTOR) 10 MG tablet Take 10 mg by mouth daily.   Yes [provider]  sucralfate (CARAFATE) 1 g tablet Take 1 tablet (1 g total) by mouth 4 (four) times daily. Dissolve each tablet in 15 cc water before use. 09/01/21  Yes  Kyung Rudd, MD  albuterol (VENTOLIN HFA) 108 (90 Base) MCG/ACT inhaler Inhale 2 puffs into the lungs every 4 (four) hours as needed for wheezing or shortness of breath. Patient not taking: No sig reported 01/07/20   Collene Gobble, MD  amitriptyline (ELAVIL) 10 MG tablet Take 10 mg by mouth daily.    [provider]  nystatin (MYCOSTATIN) 100000 UNIT/ML suspension Take 5 mLs (500,000 Units total) by mouth 4 (four) times daily. Patient not taking: No sig reported 07/25/21   Hayden Pedro, PA-C  oxyCODONE-acetaminophen (PERCOCET/ROXICET) 5-325 MG tablet Take 1 tablet by mouth every 6 (six) hours as needed for severe pain. Patient not taking: No sig reported 08/04/21   Heilingoetter, Cassandra L, PA-C  SYMBICORT 160-4.5 MCG/ACT inhaler TAKE 2 PUFFS BY MOUTH TWICE A DAY Patient not taking: No sig reported 01/24/21   Collene Gobble, MD  Tiotropium Bromide Monohydrate (SPIRIVA RESPIMAT) 2.5 MCG/ACT AERS Inhale 2 puffs into the lungs daily. Patient not taking: No sig reported 04/15/20   Collene Gobble, MD   Allergies  Allergen Reactions   Dyazide [Hydrochlorothiazide W-Triamterene]     hives   Zestoretic [Lisinopril-Hydrochlorothiazide]     Swelling    Review of Systems  Constitutional:  Positive for activity change.  Respiratory:  Positive for chest tightness and shortness of breath.   Neurological:  Positive for weakness.  Psychiatric/Behavioral:  Positive for confusion  and sleep disturbance.    Physical Exam General: Alert, awake, in no acute distress.   HEENT: No bruits, no goiter, no JVD Heart: Regular rate and rhythm. No murmur appreciated. Lungs: Good air movement, clear Abdomen: Soft, nontender, nondistended, positive bowel sounds.   Ext: +edema Skin: Warm and dry Neuro: Grossly intact, nonfocal.   Vital Signs: BP (!) 110/58   Pulse (!) 113   Temp (!) 97.3 F (36.3 C) (Oral)   Resp (!) 22   Ht _0  (1.753 m)   Wt 75 kg   SpO2 90%   BMI 24.42 kg/m  Pain Scale: 0-10   Pain Score: 0-No pain   SpO2: SpO2: 90 % O2 Device:SpO2: 90 % O2 Flow Rate: .O2 Flow Rate (L/min): 6 L/min  IO: Intake/output summary:  Intake/Output Summary (Last 24 hours) at 09/16/2021 1834 Last data filed at 09/16/2021 1700 Gross per 24 hour  Intake 2977.75 ml  Output 200 ml  Net 2777.75 ml    LBM: Last BM Date:  (PTA) Baseline Weight: Weight: 75 kg Most recent weight: Weight: 75 kg     Palliative Assessment/Data:   Flowsheet Rows    Flowsheet Row Most Recent Value  Intake Tab   Referral Department Hospitalist  Unit at Time of Referral ICU  Palliative Care Primary Diagnosis Sepsis/Infectious Disease  Date Notified 09/15/21  Palliative Care Type New Palliative care  Reason for referral Clarify Goals of Care  Date of Admission 08/27/2021  Date first seen by Palliative Care 09/16/21  # of days Palliative referral response time 1 Day(s)  # of days IP prior to Palliative referral 4  Clinical Assessment   Palliative Performance Scale Score 40%  Psychosocial & Spiritual Assessment   Palliative Care Outcomes        Time In: 1500 Time Out: 1620 Time Total: 80 Greater than 50%  of this time was spent counseling and coordinating care related to the above assessment and plan.  Signed by: Micheline Rough, MD   Please contact Palliative Medicine Team phone at [phone] for questions and concerns.  For  individual provider: See  Shea Evans

## 2021-09-16 NOTE — Progress Notes (Signed)
Peripherally Inserted Central Catheter Placement  The IV Nurse has discussed with the patient and/or persons authorized to consent for the patient, the purpose of this procedure and the potential benefits and risks involved with this procedure.  The benefits include less needle sticks, lab draws from the catheter, and the patient may be discharged home with the catheter. Risks include, but not limited to, infection, bleeding, blood clot (thrombus formation), and puncture of an artery; nerve damage and irregular heartbeat and possibility to perform a PICC exchange if needed/ordered by physician.  Alternatives to this procedure were also discussed.  Bard Power PICC patient education guide, fact sheet on infection prevention and patient information card has been provided to patient /or left at bedside.  Consent signed by son yesterday.  Husband and son currently at bedside in agreement today.  PICC Placement Documentation  PICC Triple Lumen 09/16/21 PICC Right Brachial 35 cm 0 cm (Active)  Indication for Insertion or Continuance of Line Vasoactive infusions;Administration of hyperosmolar/irritating solutions (i.e. TPN, Vancomycin, etc.);Poor Vasculature-patient has had multiple peripheral attempts or PIVs lasting less than 24 hours;Limited venous access - need for IV therapy >5 days (PICC only) 09/16/21 1853  Exposed Catheter (cm) 0 cm 09/16/21 1853  Site Assessment Clean;Intact;Dry 09/16/21 1853  Lumen #1 Status Flushed;Saline locked;Blood return noted 09/16/21 1853  Lumen #2 Status Flushed;Saline locked;Blood return noted 09/16/21 1853  Lumen #3 Status Flushed;Saline locked;Blood return noted 09/16/21 1853  Dressing Type Transparent;Securing device 09/16/21 1853  Dressing Status Clean;Dry;Intact 09/16/21 1853  Antimicrobial disc in place? Yes 09/16/21 1853  Safety Lock Not Applicable 08/65/78 4696  Line Care Connections checked and tightened 09/16/21 1853  Line Adjustment (NICU/IV Team Only) No  09/16/21 1853  Dressing Intervention New dressing 09/16/21 1853  Dressing Change Due 09/23/21 09/16/21 1853       Rolena Infante 09/16/2021, 6:53 PM

## 2021-09-16 NOTE — Progress Notes (Signed)
Progress Note  Patient Name: Patricia Sampson Date of Encounter: 09/16/2021  [facility] Cardiologist:Nahser  Subjective   Some ongoig SOB  Inpatient Medications    Scheduled Meds:  chlorhexidine  15 mL Mouth Rinse BID   Chlorhexidine Gluconate Cloth  6 each Topical Daily   mouth rinse  15 mL Mouth Rinse q12n4p   Continuous Infusions:  sodium chloride Stopped (09/16/21 0538)   sodium chloride Stopped (09/14/21 0200)   acyclovir (ZOVIRAX) [phone] mg IVPB Stopped (09/16/21 0636)   amiodarone 30 mg/hr (09/16/21 0600)   ampicillin (OMNIPEN) IV Stopped (09/16/21 0510)   cefTRIAXone (ROCEPHIN)  IV Stopped (09/15/21 2207)   diltiazem (CARDIZEM) infusion Stopped (09/13/21 1817)   heparin 1,400 Units/hr (09/16/21 0600)   potassium chloride 10 mEq (09/16/21 0651)   PRN Meds: sodium chloride, acetaminophen **OR** acetaminophen, ondansetron **OR** ondansetron (ZOFRAN) IV   Vital Signs    Vitals:   09/16/21 0400 09/16/21 0448 09/16/21 0500 09/16/21 0600  BP: 118/69  112/66 (!) 113/50  Pulse:   89 91  Resp: (!) 24  20 (!) 21  Temp:  (!) 96.2 F (35.7 C)    TempSrc:  Axillary    SpO2: 95%  99% 97%    Intake/Output Summary (Last 24 hours) at 09/16/2021 1884 Last data filed at 09/16/2021 0600 Gross per 24 hour  Intake 3785.06 ml  Output 200 ml  Net 3585.06 ml   Last 3 Weights 09/04/2021 09/04/2021 08/28/2021  Weight (lbs) 164 lb 8 oz (No Data) (No Data)  Weight (kg) 74.617 kg (No Data) (No Data)      Telemetry    SR - Personally Reviewed  ECG    N/a - Personally Reviewed  Physical Exam   GEN: No acute distress.   Neck: No JVD Cardiac: RRR, no murmurs, rubs, or gallops.  Respiratory: mild coarse breath sounds bilaterlally GI: Soft, nontender, non-distended  MS: No edema; No deformity. Neuro:  Nonfocal  Psych: Normal affect   Labs    High Sensitivity Troponin:  No results for input(s): TROPONINIHS in the last 720 hours.   Chemistry Recent Labs  Lab  09/14/2021 1155 09/17/2021 1201 09/12/21 0248 09/12/21 0248 09/14/21 0004 09/14/21 2141 09/15/21 0255 09/15/21 1443 09/16/21 0259  NA 135   < > 137  --  139 135 136 134* 134*  K 3.5   < > 3.4*  --  3.3* 3.0* 3.0* 3.3* 3.1*  CL 86*   < > 92*  --  97* 94* 93* 92* 91*  CO2 38*  --  33*  --  34* 30 33* 36* 37*  GLUCOSE 107*   < > 84  --  112* 109* 118* 125* 116*  BUN 22   < > 15  --  _0 7*  CREATININE 0.95   < > 0.63  --  0.74 0.57 0.62 0.55 0.55  CALCIUM 8.6*  --  8.3*  --  7.4* 7.9* 7.8* 8.2* 8.2*  MG  --   --   --    < > 1.2* 1.9 1.7  --  1.9  PROT 7.1  --  6.2*  --  5.5*  --   --   --   --   ALBUMIN 2.7*  --  2.4*  --  2.2*  --   --   --   --   AST 17  --  14*  --  14*  --   --   --   --  ALT 10  --  10  --  9  --   --   --   --   ALKPHOS 73  --  72  --  62  --   --   --   --   BILITOT 0.6  --  0.6  --  0.5  --   --   --   --   GFRNONAA >60  --  >60  --  >60 >60 >60 >60 >60  ANIONGAP 11  --  12  --  _0 < > = values in this interval not displayed.    Lipids No results for input(s): CHOL, TRIG, HDL, LABVLDL, LDLCALC, CHOLHDL in the last 168 hours.  Hematology Recent Labs  Lab 09/14/21 0004 09/15/21 0255 09/16/21 0259  WBC 2.9* 2.9* 2.4*  RBC 3.11* 3.07* 2.88*  HGB 7.8* 7.8* 7.4*  HCT 26.9* 26.4* 25.2*  MCV 86.5 86.0 87.5  MCH 25.1* 25.4* 25.7*  MCHC 29.0* 29.5* 29.4*  RDW 18.6* 18.8* 18.6*  PLT 133* 137* 138*   Thyroid  Recent Labs  Lab 09/12/21 0855  TSH 2.466    BNPNo results for input(s): BNP, PROBNP in the last 168 hours.  DDimer No results for input(s): DDIMER in the last 168 hours.   Radiology    DG CHEST PORT 1 VIEW  Result Date: 09/15/2021 CLINICAL DATA:  Dyspnea and respiratory abnormality EXAM: PORTABLE CHEST 1 VIEW COMPARISON:  CT chest angiography 09/14/2021 FINDINGS: The heart and mediastinal contours are within normal limits. Bibasilar patchy airspace opacities. Left upper lobe airspace opacity. Increased and coarsened  interstitial markings. No pulmonary edema. Trace right pleural effusion. Possible trace left pleural effusion. No pneumothorax. No acute osseous abnormality. Degenerative changes of bilateral shoulders. IMPRESSION: 1. Interval development of bibasilar patchy airspace opacities which may represent a combination of infection/inflammation and/or atelectasis. 2. Trace right pleural effusion. Possible trace left pleural effusion. 3. Left upper lobe airspace opacity consistent with known primary bronchogenic carcinoma better evaluated on CT angiography chest 09/01/2021. 4.  Emphysema (ICD10-J43.9). Electronically Signed   By: Iven Finn M.D.   On: 09/15/2021 15:08   ECHOCARDIOGRAM COMPLETE  Result Date: 09/14/2021    ECHOCARDIOGRAM REPORT   Patient Name:   Patricia Sampson Date of Exam: 09/14/2021 Medical Rec #:  [id number]        Height:       70.0 in Accession #:    [id number]       Weight:       164.5 lb Date of Birth:  [birth year]       BSA:          1.921 m Patient Age:    65 years         BP:           121/65 mmHg Patient Gender: F                HR:           104 bpm. Exam Location:  Inpatient Procedure: 2D Echo, Cardiac Doppler and Color Doppler Indications:    Abnormal Ekg  History:        Patient has prior history of Echocardiogram examinations, most                 recent 01/06/2017. Risk Factors:Hypertension.  Sonographer:    Helmut Muster Referring Phys: [place]  1. Left ventricular ejection fraction, by estimation, is 55 to  60%. The left ventricle has normal function. The left ventricle has no regional wall motion abnormalities. Left ventricular diastolic parameters are consistent with Grade I diastolic dysfunction (impaired relaxation).  2. There is mild respirophasic variation of interventricular septum position, possibly due to the patient's underlying lung disease. Right ventricular systolic function is normal. The right ventricular size is normal.  3. The mitral valve  is normal in structure. No evidence of mitral valve regurgitation. No evidence of mitral stenosis.  4. The aortic valve is tricuspid. Aortic valve regurgitation is not visualized. No aortic stenosis is present.  5. The inferior vena cava is dilated in size with <50% respiratory variability, suggesting right atrial pressure of 15 mmHg. FINDINGS  Left Ventricle: Left ventricular ejection fraction, by estimation, is 55 to 60%. The left ventricle has normal function. The left ventricle has no regional wall motion abnormalities. The left ventricular internal cavity size was normal in size. There is  no left ventricular hypertrophy. Left ventricular diastolic parameters are consistent with Grade I diastolic dysfunction (impaired relaxation). Right Ventricle: There is mild respirophasic variation of interventricular septum position, possibly due to the patient's underlying lung disease. The right ventricular size is normal. No increase in right ventricular wall thickness. Right ventricular systolic function is normal. Left Atrium: Left atrial size was normal in size. Right Atrium: Right atrial size was normal in size. Pericardium: There is no evidence of pericardial effusion. Mitral Valve: The mitral valve is normal in structure. No evidence of mitral valve regurgitation. No evidence of mitral valve stenosis. MV peak gradient, 4.8 mmHg. The mean mitral valve gradient is 3.0 mmHg. Tricuspid Valve: The tricuspid valve is normal in structure. Tricuspid valve regurgitation is not demonstrated. Aortic Valve: The aortic valve is tricuspid. Aortic valve regurgitation is not visualized. No aortic stenosis is present. Pulmonic Valve: The pulmonic valve was normal in structure. Pulmonic valve regurgitation is not visualized. Aorta: The aortic root is normal in size and structure. Venous: The inferior vena cava is dilated in size with less than 50% respiratory variability, suggesting right atrial pressure of 15 mmHg. IAS/Shunts: No  atrial level shunt detected by color flow Doppler.  LEFT VENTRICLE PLAX 2D LVIDd:         5.00 cm     Diastology LVIDs:         3.40 cm     LV e' medial:    10.90 cm/s LV PW:         0.70 cm     LV E/e' medial:  8.5 LV IVS:        0.70 cm     LV e' lateral:   12.70 cm/s LVOT diam:     2.00 cm     LV E/e' lateral: 7.3 LV SV:         72 LV SV Index:   37 LVOT Area:     3.14 cm  LV Volumes (MOD) LV vol d, MOD A2C: 70.3 ml LV vol d, MOD A4C: 57.6 ml LV vol s, MOD A2C: 28.1 ml LV vol s, MOD A4C: 24.0 ml LV SV MOD A2C:     42.2 ml LV SV MOD A4C:     57.6 ml LV SV MOD BP:      39.6 ml RIGHT VENTRICLE             IVC RV S prime:     20.90 cm/s  IVC diam: 2.60 cm TAPSE (M-mode): 3.0 cm LEFT ATRIUM  Index        RIGHT ATRIUM           Index LA diam:        2.70 cm 1.41 cm/m   RA Area:     11.10 cm LA Vol (A2C):   40.0 ml 20.82 ml/m  RA Volume:   25.00 ml  13.01 ml/m LA Vol (A4C):   57.4 ml 29.88 ml/m LA Biplane Vol: 52.8 ml 27.49 ml/m  AORTIC VALVE LVOT Vmax:   146.00 cm/s LVOT Vmean:  89.300 cm/s LVOT VTI:    0.229 m  AORTA Ao Root diam: 3.40 cm Ao Asc diam:  3.50 cm MITRAL VALVE MV Area (PHT): 5.13 cm    SHUNTS MV Area VTI:   3.38 cm    Systemic VTI:  0.23 m MV Peak grad:  4.8 mmHg    Systemic Diam: 2.00 cm MV Mean grad:  3.0 mmHg MV Vmax:       1.09 m/s MV Vmean:      83.9 cm/s MV Decel Time: 148 msec MV E velocity: 92.40 cm/s MV A velocity: 98.50 cm/s MV E/A ratio:  0.94 Dalton McleanMD Electronically signed by Franki Monte Signature Date/Time: 09/14/2021/6:10:19 PM    Final    [place] EKG SITE RITE  Result Date: 09/15/2021 If Site Rite image not attached, placement could not be confirmed due to current cardiac rhythm.   Cardiac Studies     Patient Profile     65 y.o. female with COPD, non-small cell lung cancer, pulmonary hypertension who now has been found to have paroxysmal A. fib.  Assessment & Plan    1.PAF - new diagnosis this admission, occurred in the setting of sepsis.  -  issues with PAF this admission - converted to SR on IV amio,  - she is on hep gtt, can convert to oral eliquis when ok from ICU/internal medicine standpoint. Convert IV to oral amio when off consistently off bipap and able to take PO more consistently, perhaps tomorrow. Plan would be for short course of oral amiodarone, afib may have been driven by her sepsis, with lung disease would want to limit overall course as well.    2. Respiratory failure/Severe COPD - followed by pulmonary, has been on bipap  3. Lung cancer  For questions or updates, please contact [place] Please consult [email or web address] for contact info under        Signed, Carlyle Dolly, MD  09/16/2021, 7:28 AM

## 2021-09-16 NOTE — Progress Notes (Signed)
Pt was taken of the bipap by the RN. She was very nervous. Pt is not in any distress and resting well.

## 2021-09-16 NOTE — Progress Notes (Addendum)
RT called in the room to give pt breathing tx. Pt was having SOB. RT asked again about bipap and pt states the bipap is not an option. Pt is on 10L [place]. Pt is resting well at this time. VSS.

## 2021-09-16 NOTE — Evaluation (Signed)
Clinical/Bedside Swallow Evaluation Patient Details  Name: Patricia Sampson MRN: [id number] Date of Birth: Mar 01, 1956  Today's Date: 09/16/2021 Time: SLP Start Time (ACUTE ONLY): 20 SLP Stop Time (ACUTE ONLY): 1420 SLP Time Calculation (min) (ACUTE ONLY): 25 min  Past Medical History:  Past Medical History:  Diagnosis Date   Angioedema    COPD (chronic obstructive pulmonary disease) (HCC)    FHx: migraine headaches    Hyperlipidemia    Hypertension    Seasonal allergies    Past Surgical History: History reviewed. No pertinent surgical history. HPI:  Patient is a 65 y.o. female with PMH: very severe COPD, associated chronic hypoxemic respiratory failure, HTN, HLD. 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 She was having AMS on 10/17 and when in ED was tachycardic, tachypneic with pancytopenia. She was admitted with acute metabolic encephalopathy and treated empirically for possible sepsis, source unknown. CXR revealed left LL airspace opacity consistent with known primary broncogenic carcinoma, trace right pleural effusion, interval development of bibasilar patchy airspace opacities which may represent a combination of infection/inflammation and/or atelectasis.    Assessment / Plan / Recommendation  Clinical Impression  Patient presents with clinical s/s of pharyngeal phase dysphagia. No overt s/s aspiration or penetration observed. Patient reported that approximately 3 weeks after her first chemoradiation treatment, she started to have difficulty swallowing and since then has been mainly eating soft solids and liquids. When taking sips or bites, patient initially would accept a very small bolus but with subsequent swallows she would accept a slightly larger amount. She appeared to perform a more effortful swallow than would be her baseline but denied any pain or discomfort. SLP discussed  swallow strategies with patient and her daughter, advising her to take her time, eat small amounts and take breaks, stop eating/drinking if SpO2 goes below 85%. SLP will f/u with patient's diet toleration, determine readiness for upgraded solids trials and determine if need for objective swallow study (MBS). SLP Visit Diagnosis: Dysphagia, unspecified (R13.10)    Aspiration Risk  No limitations;Mild aspiration risk    Diet Recommendation Dysphagia 1 (Puree);Thin liquid   Liquid Administration via: Cup;Straw Medication Administration: Whole meds with puree Supervision: Full supervision/cueing for compensatory strategies;Staff to assist with self feeding;Comment (family may assist as well) Compensations: Minimize environmental distractions;Slow rate;Small sips/bites Postural Changes: Seated upright at 90 degrees    Other  Recommendations Oral Care Recommendations: Oral care BID    Recommendations for follow up therapy are one component of a multi-disciplinary discharge planning process, led by the attending physician.  Recommendations may be updated based on patient status, additional functional criteria and insurance authorization.  Follow up Recommendations None      Frequency and Duration min 2x/week  1 week       Prognosis Prognosis for Safe Diet Advancement: Good Barriers to Reach Goals: Time post onset      Swallow Study   General Date of Onset: 08/29/2021 HPI: Patient is a 65 y.o. female with PMH: very severe COPD, associated chronic hypoxemic respiratory failure, HTN, HLD. 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 She was having AMS on 10/17 and when in ED was tachycardic, tachypneic with pancytopenia. She was admitted with acute metabolic encephalopathy and treated empirically for possible sepsis, source unknown. CXR revealed left LL airspace opacity consistent with known primary  broncogenic carcinoma, trace right  pleural effusion, interval development of bibasilar patchy airspace opacities which may represent a combination of infection/inflammation and/or atelectasis. Type of Study: Bedside Swallow Evaluation Previous Swallow Assessment: none found Diet Prior to this Study: NPO Temperature Spikes Noted: No Respiratory Status: Nasal cannula History of Recent Intubation: No Behavior/Cognition: Alert;Cooperative;Pleasant mood Oral Cavity Assessment: Within Functional Limits Oral Care Completed by SLP: Recent completion by staff Oral Cavity - Dentition: Dentures, not available;Edentulous Vision: Functional for self-feeding Self-Feeding Abilities: Total assist;Needs assist;Needs set up Patient Positioning: Upright in bed Volitional Cough: Strong Volitional Swallow: Able to elicit    Oral/Motor/Sensory Function Overall Oral Motor/Sensory Function: Within functional limits   Ice Chips     Thin Liquid Thin Liquid: Impaired Presentation: Straw Pharyngeal  Phase Impairments: Other (comments) Other Comments: patient appeared to require extra effort when initiating swallow    Nectar Thick     Honey Thick     Puree Puree: Impaired Pharyngeal Phase Impairments: Other (comments) Other Comments: patient appeared to swallow more effortfully with puree solids   Solid     Solid: Not tested Other Comments: patient does not have dentures and they are not fitting as well     Sonia Baller, MA, CCC-SLP Speech Therapy

## 2021-09-16 NOTE — Progress Notes (Signed)
[facility] ADULT ICU REPLACEMENT PROTOCOL   The patient does apply for the [facility] Adult ICU Electrolyte Replacment Protocol based on the criteria listed below:   1.Exclusion criteria: TCTS patients, ECMO patients, and Dialysis patients 2. Is GFR >/= 30 ml/min? Yes.    Patient's GFR today is >60 3. Is SCr </= 2? Yes.   Patient's SCr is 0.55 mg/dL 4. Did SCr increase >/= 0.5 in 24 hours? No. 5.Pt's weight >40kg  Yes.   6. Abnormal electrolyte(s): Mag 1.9, K+ 3.1  7. Electrolytes replaced per protocol 8.  Call MD STAT for K+ </= 2.5, Phos </= 1, or Mag </= 1 Physician:  n/a  Darlys Gales 09/16/2021 5:07 AM

## 2021-09-16 NOTE — Progress Notes (Signed)
RT inquired pt about the use of bipap and recommended to use it tonight. Pt was strongly refusing. Pt said she wore it last night and she is not going to wear it tonight. Advised pt to let the RN know if she changes her mind. Pt again said no she is not going to wear it.

## 2021-09-16 NOTE — Progress Notes (Signed)
Patient began to experience difficulty breathing. Attempted to place pt on bipap. Patient and family refused to use bipap. Patient and family were educated on the use of bipap, and explained the risks for refusing. RT called to bedside for breathing treatment.

## 2021-09-16 NOTE — Progress Notes (Signed)
NAME:  Patricia Sampson, MRN:  [id number], DOB:  [birth year], LOS: 5 ADMISSION DATE:  09/13/2021, CONSULTATION DATE: 09/10/2021 REFERRING MD: Dr. Marylyn Ishihara, CHIEF COMPLAINT: Encephalopathy  History of Present Illness:  65 year old woman with very severe COPD, associated chronic hypoxemic respiratory failure (4-8 L/min), hypertension, hyperlipidemia.  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 She was noted to have altered mental status on the morning of 10/17, appeared to be normal when she went to bed the night before.  In the emergency department she was tachycardic, tachypneic with pancytopenia.  Last chemotherapy infusion appears to have been 09/05/2021.  Acute metabolic encephalopathy.  She was treated empirically for possible sepsis, source unknown. Home med list with Percocet, Neurontin.  Her husband notes that he does not believe she is been taking any of her medicines for the last 2 to 3 days.   Pertinent  Medical History   Past Medical History:  Diagnosis Date   Angioedema    COPD (chronic obstructive pulmonary disease) (HCC)    FHx: migraine headaches    Hyperlipidemia    Hypertension    Seasonal allergies     Significant Hospital Events: Including procedures, antibiotic start and stop dates in addition to other pertinent events   CT abdomen pelvis 10/17 >> no acute findings Head CT 10/17 >> normal CT-PA chest 10/17 >> no new infiltrates or significant change in the left upper lobe mass.  No PE MRI brain 10/17 >> Motion degradation compromise interpretation, no acute or subacute infarct, no mass lesion  Progressive left upper lobe primary bronchogenic carcinoma with increased destruction of the anterior left second rib. New perilymphatic nodularity in the left upper lobe adjacent to the mass, consistent with lymphangitic carcinomatosis. 3. Unchanged and left suprahilar nodal metastatic  disease.  EEG 10/18 with generalized slowing, no evidence of active seizures or epileptiform activity 10/18 acyclovir and ampicillin added to cover possible meningitis 10/19 SVT, likely A. fib/flutter noted after adenosine given.  Hypotension with diltiazem, then started amiodarone.  Back in sinus tachycardia 10/20 - SVT with what looks like A. fib/flutter after adenosine given early evening 10/19.  No real response to diltiazem infusion, then given amiodarone.  Back in sinus tachycardia, . \atient continued to be aphasic and to have agitation through the day 10/19.  Now off Precedex. I/O positive 8.7 L total. Has had hypokalemia, replaced. WBC increasing, 2.9 this morning 10/20  10/21 -husband and son at the bedside.  They tell me that after she had COVID in 2020 she has been on 6 L oxygen.  For the last 3 months before admissions she has had 35 pound weight loss approximately.  August 2022 was diagnosed with cancers in September 2022 was on chemoradiation.  Since chemoradiation there is more failure to thrive with progressive decrease in activity particularly after chemoradiation cycles. Currently on 10 L nasal cannula pulse ox 95%.  Has not been on BiPAP.  Has not been on the ventilator.  Is not on pressors.  Not on Precedex.   Interim History / Subjective:   10/22 -had severe worsening of respiratory acidosis acute on chronic with a pH of 7.22 and a PCO2 of 90 associated with worsening bilateral lower lobe atelectasis [has left upper lobe cancer] on chest x-ray compared to 09/23/2021.  Treated with BiPAP.  Blood gas subsequent improvement but did not tolerate BiPAP.  Nonetheless mental status today better.  Husband also acknowledges that she is looking  better.  Cachectic elderly female.  Significant physical deconditioning . currently on 6 L nasal cannula pulse ox 95%.  Continues on IV heparin infusion and amio infusion.  On acyclovir and ampicillin and ceftriaxone for potential meningitis.   Afebrile since 09/13/2021 but leukopenic with a white count of 2.4K.  Culture negative so far  Objective   Blood pressure 119/66, pulse 92, temperature (!) 97.3 F (36.3 C), temperature source Oral, resp. rate (!) 22, weight 75 kg, SpO2 99 %.    FiO2 (%):  [50 %] 50 % Estimated body mass index is 23.72 kg/m as calculated from the following:   Height as of 09/04/21: _0  (1.778 m).   Weight as of this encounter: 75 kg.   Intake/Output Summary (Last 24 hours) at 09/16/2021 1227 Last data filed at 09/16/2021 1200 Gross per 24 hour  Intake 3010.89 ml  Output 200 ml  Net 2810.89 ml   Filed Weights   09/16/21 1200  Weight: 75 kg    Examination: Cachectic elderly female.  Significant physical deconditioning but nevertheless looking stronger.  More alert and actually oriented.  Able to have a conversation.  There is a marked difference compared to yesterday.  Clear to auscultation with distant breath sounds.  Oxygen on.  Normal heart sounds but in atrial fibrillation abdomen soft.  Chronic venous stasis edema present.     Resolved Hospital Problem list     Assessment & Plan:  Acute encephalopathy, appreciate neurology assistance with the case.  Some evolving evidence for an agitated delirium.  Still considering a toxic metabolic cause, no clear source of infection but consider meningitis.  LP deferred due to associated risk of performing.  No evidence for CVA, no evidence for active seizures or recent seizure.  Vitamin B12 borderline low.  She is on gabapentin at home but unlikely for this to represent withdrawal.  Off Precedex since 09/13/2021    09/16/2021  -marked improvement in last 24 hours with normalization of mental status after treating with BiPAP for acute on chronic hypoxemic and hypercarbic respiratory failure   Plan - meningitis coverage antibiotics including the acyclovir - per neuro - could consider ID consult if needed -hospitalist to decide endpoint -Avoid  hypercarbia and acidosis -Careful with sedating medications -Ensure adequate oxygenation -for racial pulse ox -pulse ox goal greater than equal to 92% -B12 supplementation   History of COVID in 2020 and oxygen dependent since then per history Acute on chronic hypoxemic respiratory failure.  She has severe COPD, is on 6-8 L/min at baseline. Severe COPD  09/16/2021: 6 L oxygen, pulse ox 95%.  Had severe worsening and hypercarbic respiratory failure 09/16/2019 improved after using BiPAP for a few hours but then became intolerant  Plan -BiPAP nightly mandatory [counseled patient and she is accepting] -Continue O2, goal SPO2 > 92%. -Not an ideal intubation candidate [goals of care ongoing but currently full code]      SVT 10/19.- PAF - card consult since 09/14/21  10/21 and 09/16/2021- on amio gtt and heparin gtt  Plan  - per cards   Adenocarcinoma of the lung, on chemoradiation.   - Prior to & Present on Admit  - Stage IIIA (T4, N1, M0) non-small cell lung cancer, adenocarcinoma presented with large left upper lobe lung mass with chest wall invasion in addition to left suprahilar lymphadenopathy diagnosed in August 2022.  - Progression with rib destruction /bony met - at admit per CT  Plan  - consider Onc consult for prognsiss if needed  Anemia - Prior to & Present on Admit - baseline 10gm% Pancytopenia (new) on presentation  09/12/2021 -with reductions in white count and platelets. without any clear evidence for infection or evidence for bleeding.   10/22 -platelet 137 stable, white count 2.4K and stable hemoglobin in the sevens and stable no active bleeding  plan -Continue to follow CBC -- PRBC for hgb </= 6.9gm%    - exceptions are   -  if ACS susepcted/confirmed then transfuse for hgb </= 8.0gm%,  or    -  active bleeding with hemodynamic instability, then transfuse regardless of hemoglobin value   At at all times try to transfuse 1 unit prbc as possible with  exception of active hemorrhage   FAilure to thrive with > 30# weight  loss - Prior to & Present on Admit Cancer pain on chronic opioid s - Prior to & Present on Admit Severe Protein calorie malnutiion - baseline alb 2.6   - Prior to & Present on Admit   Plan  - per [facility] - consider pallaitive care consult     Best Practice (right click and "Reselect all SmartList Selections" daily)   Diet/type: Regular consistency (see orders) DVT prophylaxis: other GI prophylaxis: N/A Lines: N/A Foley:  N/A Code Status:  full code Last date of multidisciplinary goals of care discussion [10/17. Discussed GOC briefly with pt's husband 10/17 -- need further discussions and would like to include pt, her son. She is full code at this time.] Family: Updated son at bedside on 10/20 and 09/15/21 and husband 09/16/2021  Dispo: Can go to progressive unit.  Discussed with hospitalist.  CCM will see her 1-2 times a week.  Might need pulmonary consult at follow-up but I suspect she is hospice eligible but this depending on goals of care   ATTESTATION & SIGNATURE     Dr. Brand Males, M.D., [facility].C.P Pulmonary and Critical Care Medicine Staff Physician [facility] Pager: [phone], If no answer or between  15:00h - 7:00h: call [phone]  09/16/2021 12:27 PM    LABS       PULMONARY Recent Labs  Lab 09/10/2021 1201 09/18/2021 1745 09/15/21 1225 09/15/21 1700  PHART  --  7.444 7.227* 7.348*  PCO2ART  --  54.2* 90.6* 67.9*  PO2ART  --  35.2* 97.5 51.8*  HCO3  --  36.6* 36.3* 36.4*  TCO2 37*  --   --   --   O2SAT  --  66.2 97.3 85.8    CBC Recent Labs  Lab 09/14/21 0004 09/15/21 0255 09/16/21 0259  HGB 7.8* 7.8* 7.4*  HCT 26.9* 26.4* 25.2*  WBC 2.9* 2.9* 2.4*  PLT 133* 137* 138*    COAGULATION Recent Labs  Lab 09/10/2021 1155 09/12/21 0248  INR 1.1 1.1    CARDIAC  No results for input(s): TROPONINI in the last 168  hours. No results for input(s): PROBNP in the last 168 hours.   CHEMISTRY Recent Labs  Lab 09/14/21 0004 09/14/21 2141 09/15/21 0255 09/15/21 1443 09/16/21 0259  NA 139 135 136 134* 134*  K 3.3* 3.0* 3.0* 3.3* 3.1*  CL 97* 94* 93* 92* 91*  CO2 34* 30 33* 36* 37*  GLUCOSE 112* 109* 118* 125* 116*  BUN _0 7*  CREATININE 0.74 0.57 0.62 0.55 0.55  CALCIUM 7.4* 7.9* 7.8* 8.2* 8.2*  MG 1.2* 1.9 1.7  --  1.9  PHOS  --   --  2.8  --   --    Estimated Creatinine Clearance: 77.8 mL/min (by C-G formula based on SCr of 0.55 mg/dL).   LIVER Recent Labs  Lab 09/14/2021 1155 09/12/21 0248 09/14/21 0004  AST 17 14* 14*  ALT _0 ALKPHOS 73 72 62  BILITOT 0.6 0.6 0.5  PROT 7.1 6.2* 5.5*  ALBUMIN 2.7* 2.4* 2.2*  INR 1.1 1.1  --      INFECTIOUS Recent Labs  Lab 09/01/2021 1155 09/12/21 0646  LATICACIDVEN 1.7  --   PROCALCITON  --  0.40     ENDOCRINE CBG (last 3)  No results for input(s): GLUCAP in the last 72 hours.       IMAGING x48h  - image(s) personally visualized  -   highlighted in bold DG CHEST PORT 1 VIEW  Result Date: 09/15/2021 CLINICAL DATA:  Dyspnea and respiratory abnormality EXAM: PORTABLE CHEST 1 VIEW COMPARISON:  CT chest angiography 09/12/2021 FINDINGS: The heart and mediastinal contours are within normal limits. Bibasilar patchy airspace opacities. Left upper lobe airspace opacity. Increased and coarsened interstitial markings. No pulmonary edema. Trace right pleural effusion. Possible trace left pleural effusion. No pneumothorax. No acute osseous abnormality. Degenerative changes of bilateral shoulders. IMPRESSION: 1. Interval development of bibasilar patchy airspace opacities which may represent a combination of infection/inflammation and/or atelectasis. 2. Trace right pleural effusion. Possible trace left pleural effusion. 3. Left upper lobe airspace opacity consistent with known primary bronchogenic carcinoma better evaluated on CT  angiography chest 09/14/2021. 4.  Emphysema (ICD10-J43.9). Electronically Signed   By: Iven Finn M.D.   On: 09/15/2021 15:08   [place] EKG SITE RITE  Result Date: 09/15/2021 If Site Rite image not attached, placement could not be confirmed due to current cardiac rhythm.

## 2021-09-16 NOTE — Progress Notes (Signed)
PROGRESS NOTE  Patricia Sampson XVQ:[id number] DOB: 1956-03-11 DOA: 09/23/2021 PCP: Greig Right, MD   LOS: 5 days   Brief narrative: Patricia Sampson is a 65 y.o. 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  In the ED patient was tachypneic and tachycardic and had pancytopenia.  Last chemotherapy was 09/05/2021.  Chest x-ray and CT head scan was negative.  Patient was started on broad-spectrum antibiotics for possible sepsis and patient was admitted to the hospital.    Assessment/Plan:  Principal Problem:   SIRS (systemic inflammatory response syndrome) (HCC) Active Problems:   COPD (chronic obstructive pulmonary disease) (HCC)   Chronic respiratory failure with hypoxia (HCC)   Altered mental status   Acute metabolic encephalopathy.  Improved.  Patient is much more alert awake and communicative today.  Possible sepsis and baseline hypoxemia.  Unclear etiology.  Patient also had aphasia.  She was initially started on broad-spectrum antibiotic on vancomycin, ampicillin and acyclovir and Rocephin..  Neurology on board and recommending lumbar puncture but family had refused..  Still NPO..  EEG on 09/12/2021 showed moderate diffuse encephalopathy..  Temperature max of 97.9 F. Empirically being treated for possible meningitis.  On acyclovir, Rocephin and ampicillin.  Infectious disease on board. Blood culture and urine culture no growth so far.  COVID and flu was negative.  CT scan of the abdomen pelvis was unremarkable.  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  ABG yesterday showed PCO2 of 67.  ABG day before was 90.  Would benefit from BiPAP if mental capacity  decreases.  Agitation during hospitalization.  Agitated delirium.  Patient also required Precedex briefly but was discontinued on 10/19..  Only appears to be comfortable.  Low vitamin B-12.  Goal more than 400.  Plan for treatment B12 supplements when p.o. is okay..  Paroxysmal A. fib with RVR.  Patient was on Cardizem drip which caused hypotension.  Patient is on amiodarone drip..  Cardiology was consulted.  On IV heparin drip.  CHA2DS2-VASc score of 2.  Patient does have history of anemia and pancytopenia.  Risk of bleeding from anticoagulation in the long-term.  Rate controlled at this time.  Hypokalemia and hypomagnesia.  Continue to replenish aggressively.  Received 2 g of IV magnesium and 60 mEq of IV potassium today.  Recheck levels in a.m.  Acute on chronic hypoxic respiratory failure.  Patient has severe COPD and 4 to 6 L of oxygen at baseline.  Patient required nonrebreather mask on admission.  PCCM on board.  Patient was on BiPAP as well which has been discontinued at this time.  Saturation goal of more than 90%.  Patient is very poor candidate for mechanical ventilation.  Continue Unasyn.  Chest x-ray yesterday showed some basilar atelectasis.  Will need continued incentive spirometry.  Add DuoNebs.  Adenocarcinoma of the lung on chemoradiation.  Stage IIIa with chest wall invasion.  With pancytopenia on presentation.  Follows up with Dr. Theda Belfast as outpatient.  Palliative care has been consulted for goals of care.  Pancytopenia.  We will continue to monitor.  Latest WBC count of 2.4 hemoglobin of 7.4 and platelet of 138.  Continue to monitor.  Difficult IV access.  PICC line placement has been consulted.  Ethics/goals  of care patient with multiple comorbidities and lung cancer.  Palliative care has been consulted for goals of care.  Overall prognosis is guarded.  Spoke with the patient's family at bedside.  DVT prophylaxis: SCDs Start: 09/12/2021 2128   Code Status: Full  code  Family Communication: Spoke with the patient's family at bedside  Status is: Inpatient  Remains inpatient appropriate because: Critically ill patient, on multiple IV drips and IV antibiotics,  Consultants: PCCM Infectious disease Neurology  Procedures: EEG  Anti-infectives:  Acyclovir Ampicillin Rocephin  Anti-infectives (From admission, onward)    Start     Dose/Rate Route Frequency Ordered Stop   09/14/21 0000  vancomycin (VANCOCIN) IVPB 1000 mg/200 mL premix  Status:  Discontinued        1,000 mg 200 mL/hr over 60 Minutes Intravenous Every 12 hours 09/13/21 1241 09/15/21 1542   09/13/21 1000  cefTRIAXone (ROCEPHIN) 2 g in sodium chloride 0.9 % 100 mL IVPB        2 g 200 mL/hr over 30 Minutes Intravenous Every 12 hours 09/13/21 0848     09/12/21 2200  acyclovir (ZOVIRAX) 745 mg in dextrose 5 % 150 mL IVPB        10 mg/kg  74.6 kg 164.9 mL/hr over 60 Minutes Intravenous Every 8 hours 09/12/21 2025     09/12/21 2100  ampicillin (OMNIPEN) 2 g in sodium chloride 0.9 % 100 mL IVPB        2 g 300 mL/hr over 20 Minutes Intravenous Every 4 hours 09/12/21 2025     09/12/21 0000  vancomycin (VANCOREADY) IVPB 750 mg/150 mL  Status:  Discontinued        750 mg 150 mL/hr over 60 Minutes Intravenous Every 12 hours 09/03/2021 1733 09/13/21 1241   08/30/2021 2359  metroNIDAZOLE (FLAGYL) IVPB 500 mg  Status:  Discontinued        500 mg 100 mL/hr over 60 Minutes Intravenous Every 12 hours 08/30/2021 2127 09/13/21 0847   09/09/2021 2200  ceFEPIme (MAXIPIME) 2 g in sodium chloride 0.9 % 100 mL IVPB  Status:  Discontinued        2 g 200 mL/hr over 30 Minutes Intravenous Every 8 hours 09/12/2021 1733 09/13/21 0848   09/02/2021 1145  vancomycin (VANCOREADY) IVPB 1500 mg/300 mL        1,500 mg 150 mL/hr over 120 Minutes Intravenous  Once 09/16/2021 1117 09/09/2021 1539   09/05/2021 1115  ceFEPIme (MAXIPIME) 2 g in sodium chloride 0.9 % 100 mL IVPB        2 g 200 mL/hr over 30 Minutes Intravenous   Once 09/06/2021 1105 08/30/2021 1216   09/09/2021 1115  metroNIDAZOLE (FLAGYL) IVPB 500 mg        500 mg 100 mL/hr over 60 Minutes Intravenous  Once 09/09/2021 1105 09/20/2021 1246   09/25/2021 1115  vancomycin (VANCOCIN) IVPB 1000 mg/200 mL premix  Status:  Discontinued        1,000 mg 200 mL/hr over 60 Minutes Intravenous  Once 09/25/2021 1105 09/17/2021 1117      Subjective: Today, patient was seen and examined at bedside.  Nursing staff reports some wheezing and chest tightness.  Denies any nausea vomiting or abdominal pain.  Denies overt pain.  Objective: Vitals:   09/16/21 0500 09/16/21 0600  BP: 112/66 (!) 113/50  Pulse: 89 91  Resp: 20 (!) 21  Temp:    SpO2: 99% 97%    Intake/Output Summary (Last 24 hours) at 09/16/2021 0300 Last data filed  at 09/16/2021 0600 Gross per 24 hour  Intake 3785.06 ml  Output 200 ml  Net 3585.06 ml   There were no vitals filed for this visit. There is no height or weight on file to calculate BMI.   Physical Exam: GENERAL: Patient is awake and oriented to place and person.  Appears ill and deconditioned.  On high flow nasal cannula, slow to respond. HENT: No scleral pallor or icterus. Pupils equally reactive to light. Oral mucosa is moist NECK: is supple, no gross swelling noted. CHEST: Decreased breath sounds bilaterally.  Coarse breath sounds noted. CVS: S1 and S2 heard, no murmur. Regular rate and rhythm.  ABDOMEN: Soft, non-tender, bowel sounds are present. EXTREMITIES: No edema. CNS: Alert awake and oriented to place and person SKIN: warm and dry without rashes.  Data Review: I have personally reviewed the following laboratory data and studies,  CBC: Recent Labs  Lab 09/02/2021 1155 08/29/2021 1201 09/12/21 0248 09/13/21 0254 09/14/21 0004 09/15/21 0255 09/16/21 0259  WBC 2.0*  --  2.4* 2.1* 2.9* 2.9* 2.4*  NEUTROABS 1.5*  --   --  1.5*  --  2.2 1.8  HGB 10.0*   < > 10.0* 8.3* 7.8* 7.8* 7.4*  HCT 33.6*   < > 33.8* 28.4* 26.9* 26.4* 25.2*   MCV 84.2  --  84.9 87.4 86.5 86.0 87.5  PLT 139*  --  111* 129* 133* 137* 138*   < > = values in this interval not displayed.   Basic Metabolic Panel: Recent Labs  Lab 09/14/21 0004 09/14/21 2141 09/15/21 0255 09/15/21 1443 09/16/21 0259  NA 139 135 136 134* 134*  K 3.3* 3.0* 3.0* 3.3* 3.1*  CL 97* 94* 93* 92* 91*  CO2 34* 30 33* 36* 37*  GLUCOSE 112* 109* 118* 125* 116*  BUN _0 7*  CREATININE 0.74 0.57 0.62 0.55 0.55  CALCIUM 7.4* 7.9* 7.8* 8.2* 8.2*  MG 1.2* 1.9 1.7  --  1.9  PHOS  --   --  2.8  --   --    Liver Function Tests: Recent Labs  Lab 09/10/2021 1155 09/12/21 0248 09/14/21 0004  AST 17 14* 14*  ALT _1 ALKPHOS 73 72 62  BILITOT 0.6 0.6 0.5  PROT 7.1 6.2* 5.5*  ALBUMIN 2.7* 2.4* 2.2*   No results for input(s): LIPASE, AMYLASE in the last 168 hours. Recent Labs  Lab 09/02/2021 1155 09/02/2021 2302  AMMONIA 18 31   Cardiac Enzymes: No results for input(s): CKTOTAL, CKMB, CKMBINDEX, TROPONINI in the last 168 hours. BNP (last 3 results) No results for input(s): BNP in the last 8760 hours.  ProBNP (last 3 results) No results for input(s): PROBNP in the last 8760 hours.  CBG: No results for input(s): GLUCAP in the last 168 hours. Recent Results (from the past 240 hour(s))  Blood Culture (routine x 2)     Status: None (Preliminary result)   Collection Time: 09/06/2021 11:55 AM   Specimen: BLOOD RIGHT HAND  Result Value Ref Range Status   Specimen Description   Final    BLOOD RIGHT HAND Performed at [facility] [address], [place], [place] [address]    Special Requests   Final    BOTTLES DRAWN AEROBIC AND ANAEROBIC Blood Culture adequate volume Performed at [facility] [address], [place], [place] [address]    Culture   Final    NO GROWTH 4 DAYS Performed at [facility],  [address] [place], [place] [address]    Report Status PENDING  Incomplete  Blood Culture (routine x 2)      Status: None (Preliminary result)   Collection Time: 08/28/2021 11:55 AM   Specimen: BLOOD  Result Value Ref Range Status   Specimen Description   Final    BLOOD LEFT ANTECUBITAL Performed at [facility] [address] [place], [place] [address]    Special Requests   Final    BOTTLES DRAWN AEROBIC AND ANAEROBIC Blood Culture results may not be optimal due to an excessive volume of blood received in culture bottles Performed at [facility] [address], [place], [place] [address]    Culture   Final    NO GROWTH 4 DAYS Performed at [facility], [place] [address], [place], [place] [address]    Report Status PENDING  Incomplete  Resp Panel by RT-PCR (Flu A&B, Covid) Nasopharyngeal Swab     Status: None   Collection Time: 08/29/2021  1:50 PM   Specimen: Nasopharyngeal Swab; Nasopharyngeal(NP) swabs in vial transport medium  Result Value Ref Range Status   SARS Coronavirus 2 by RT PCR NEGATIVE NEGATIVE Final    Comment: (NOTE) SARS-CoV-2 target nucleic acids are NOT DETECTED.  The SARS-CoV-2 RNA is generally detectable in upper respiratory specimens during the acute phase of infection. The lowest concentration of SARS-CoV-2 viral copies this assay can detect is 138 copies/mL. A negative result does not preclude SARS-Cov-2 infection and should not be used as the sole basis for treatment or other patient management decisions. A negative result may occur with  improper specimen collection/handling, submission of specimen other than nasopharyngeal swab, presence of viral mutation(s) within the areas targeted by this assay, and inadequate number of viral copies(<138 copies/mL). A negative result must be combined with clinical observations, patient history, and epidemiological information. The expected result is Negative.  Fact Sheet for Patients:  EntrepreneurPulse.com.au  Fact Sheet for Healthcare Providers:   IncredibleEmployment.be  This test is no t yet approved or cleared by the [place] FDA and  has been authorized for detection and/or diagnosis of SARS-CoV-2 by FDA under an Emergency Use Authorization (EUA). This EUA will remain  in effect (meaning this test can be used) for the duration of the COVID-19 declaration under Section 564(b)(1) of the Act, 21 U.S.C.section 360bbb-3(b)(1), unless the authorization is terminated  or revoked sooner.       Influenza A by PCR NEGATIVE NEGATIVE Final   Influenza B by PCR NEGATIVE NEGATIVE Final    Comment: (NOTE) The Xpert Xpress SARS-CoV-2/FLU/RSV plus assay is intended as an aid in the diagnosis of influenza from Nasopharyngeal swab specimens and should not be used as a sole basis for treatment. Nasal washings and aspirates are unacceptable for Xpert Xpress SARS-CoV-2/FLU/RSV testing.  Fact Sheet for Patients: EntrepreneurPulse.com.au  Fact Sheet for Healthcare Providers: IncredibleEmployment.be  This test is not yet approved or cleared by the [place] FDA and has been authorized for detection and/or diagnosis of SARS-CoV-2 by FDA under an Emergency Use Authorization (EUA). This EUA will remain in effect (meaning this test can be used) for the duration of the COVID-19 declaration under Section 564(b)(1) of the Act, 21 U.S.C. section 360bbb-3(b)(1), unless the authorization is terminated or revoked.  Performed at [facility], [place] [address] [place], [place] [address]   Urine Culture     Status: None   Collection Time: 08/30/2021  2:57 PM  Specimen: Urine, Random  Result Value Ref Range Status   Specimen Description   Final    URINE, RANDOM Performed at [facility] [address], [place], [place] [address]    Special Requests   Final    NONE Performed at [facility], [place] [address], [place], [place]  [address]    Culture   Final    NO GROWTH Performed at [facility], [place] [address] [place], [place] [address]    Report Status 09/12/2021 FINAL  Final  MRSA Next Gen by PCR, Nasal     Status: None   Collection Time: 09/08/2021  9:13 PM   Specimen: Nasal Mucosa; Nasal Swab  Result Value Ref Range Status   MRSA by PCR Next Gen NOT DETECTED NOT DETECTED Final    Comment: (NOTE) The GeneXpert MRSA Assay (FDA approved for NASAL specimens only), is one component of a comprehensive MRSA colonization surveillance program. It is not intended to diagnose MRSA infection nor to guide or monitor treatment for MRSA infections. Test performance is not FDA approved in patients less than 31 years old. Performed at [facility], [place] [address] [place],  [address]      Studies: DG CHEST PORT 1 VIEW  Result Date: 09/15/2021 CLINICAL DATA:  Dyspnea and respiratory abnormality EXAM: PORTABLE CHEST 1 VIEW COMPARISON:  CT chest angiography 09/22/2021 FINDINGS: The heart and mediastinal contours are within normal limits. Bibasilar patchy airspace opacities. Left upper lobe airspace opacity. Increased and coarsened interstitial markings. No pulmonary edema. Trace right pleural effusion. Possible trace left pleural effusion. No pneumothorax. No acute osseous abnormality. Degenerative changes of bilateral shoulders. IMPRESSION: 1. Interval development of bibasilar patchy airspace opacities which may represent a combination of infection/inflammation and/or atelectasis. 2. Trace right pleural effusion. Possible trace left pleural effusion. 3. Left upper lobe airspace opacity consistent with known primary bronchogenic carcinoma better evaluated on CT angiography chest 09/20/2021. 4.  Emphysema (ICD10-J43.9). Electronically Signed   By: Iven Finn M.D.   On: 09/15/2021 15:08   ECHOCARDIOGRAM COMPLETE  Result Date: 09/14/2021    ECHOCARDIOGRAM REPORT   Patient Name:   Patricia Sampson Date of Exam: 09/14/2021 Medical Rec #:  [id number]        Height:       70.0 in Accession #:    [id number]       Weight:       164.5 lb Date of Birth:  [birth year]       BSA:          1.921 m Patient Age:    74 years         BP:           121/65 mmHg Patient Gender: F                HR:           104 bpm. Exam Location:  Inpatient Procedure: 2D Echo, Cardiac Doppler and Color Doppler Indications:    Abnormal Ekg  History:        Patient has prior history of Echocardiogram examinations, most                 recent 01/06/2017. Risk Factors:Hypertension.  Sonographer:    Helmut Muster Referring Phys: [place]  1. Left ventricular ejection fraction, by estimation, is 55 to 60%. The left ventricle has normal function. The left ventricle has no regional wall motion abnormalities.  Left ventricular diastolic parameters are consistent with Grade I diastolic dysfunction (impaired relaxation).  2. There is mild respirophasic variation of interventricular septum position, possibly due to the patient's underlying lung disease. Right ventricular systolic function is normal. The right ventricular size is normal.  3. The mitral valve is normal in structure. No evidence of mitral valve regurgitation. No evidence of mitral stenosis.  4. The aortic valve is tricuspid. Aortic valve regurgitation is not visualized. No aortic stenosis is present.  5. The inferior vena cava is dilated in size with <50% respiratory variability, suggesting right atrial pressure of 15 mmHg. FINDINGS  Left Ventricle: Left ventricular ejection fraction, by estimation, is 55 to 60%. The left ventricle has normal function. The left ventricle has no regional wall motion abnormalities. The left ventricular internal cavity size was normal in size. There is  no left ventricular hypertrophy. Left ventricular diastolic parameters are consistent with Grade I diastolic dysfunction (impaired relaxation). Right Ventricle: There is mild  respirophasic variation of interventricular septum position, possibly due to the patient's underlying lung disease. The right ventricular size is normal. No increase in right ventricular wall thickness. Right ventricular systolic function is normal. Left Atrium: Left atrial size was normal in size. Right Atrium: Right atrial size was normal in size. Pericardium: There is no evidence of pericardial effusion. Mitral Valve: The mitral valve is normal in structure. No evidence of mitral valve regurgitation. No evidence of mitral valve stenosis. MV peak gradient, 4.8 mmHg. The mean mitral valve gradient is 3.0 mmHg. Tricuspid Valve: The tricuspid valve is normal in structure. Tricuspid valve regurgitation is not demonstrated. Aortic Valve: The aortic valve is tricuspid. Aortic valve regurgitation is not visualized. No aortic stenosis is present. Pulmonic Valve: The pulmonic valve was normal in structure. Pulmonic valve regurgitation is not visualized. Aorta: The aortic root is normal in size and structure. Venous: The inferior vena cava is dilated in size with less than 50% respiratory variability, suggesting right atrial pressure of 15 mmHg. IAS/Shunts: No atrial level shunt detected by color flow Doppler.  LEFT VENTRICLE PLAX 2D LVIDd:         5.00 cm     Diastology LVIDs:         3.40 cm     LV e' medial:    10.90 cm/s LV PW:         0.70 cm     LV E/e' medial:  8.5 LV IVS:        0.70 cm     LV e' lateral:   12.70 cm/s LVOT diam:     2.00 cm     LV E/e' lateral: 7.3 LV SV:         72 LV SV Index:   37 LVOT Area:     3.14 cm  LV Volumes (MOD) LV vol d, MOD A2C: 70.3 ml LV vol d, MOD A4C: 57.6 ml LV vol s, MOD A2C: 28.1 ml LV vol s, MOD A4C: 24.0 ml LV SV MOD A2C:     42.2 ml LV SV MOD A4C:     57.6 ml LV SV MOD BP:      39.6 ml RIGHT VENTRICLE             IVC RV S prime:     20.90 cm/s  IVC diam: 2.60 cm TAPSE (M-mode): 3.0 cm LEFT ATRIUM             Index        RIGHT ATRIUM  Index LA diam:        2.70 cm  1.41 cm/m   RA Area:     11.10 cm LA Vol (A2C):   40.0 ml 20.82 ml/m  RA Volume:   25.00 ml  13.01 ml/m LA Vol (A4C):   57.4 ml 29.88 ml/m LA Biplane Vol: 52.8 ml 27.49 ml/m  AORTIC VALVE LVOT Vmax:   146.00 cm/s LVOT Vmean:  89.300 cm/s LVOT VTI:    0.229 m  AORTA Ao Root diam: 3.40 cm Ao Asc diam:  3.50 cm MITRAL VALVE MV Area (PHT): 5.13 cm    SHUNTS MV Area VTI:   3.38 cm    Systemic VTI:  0.23 m MV Peak grad:  4.8 mmHg    Systemic Diam: 2.00 cm MV Mean grad:  3.0 mmHg MV Vmax:       1.09 m/s MV Vmean:      83.9 cm/s MV Decel Time: 148 msec MV E velocity: 92.40 cm/s MV A velocity: 98.50 cm/s MV E/A ratio:  0.94 Dalton McleanMD Electronically signed by Franki Monte Signature Date/Time: 09/14/2021/6:10:19 PM    Final    [place] EKG SITE RITE  Result Date: 09/15/2021 If Site Rite image not attached, placement could not be confirmed due to current cardiac rhythm.     Flora Lipps, MD  [facility] 09/16/2021  If 7PM-7AM, please contact night-coverage

## 2021-09-16 NOTE — Progress Notes (Signed)
Spoke with RN re PICC order.  States has adequate IV access at this point.  Will secure chat if any needs change.

## 2021-09-16 NOTE — Progress Notes (Signed)
ANTICOAGULATION CONSULT NOTE - follow up  Pharmacy Consult for heparin Indication: atrial fibrillation  Allergies  Allergen Reactions   Dyazide [Hydrochlorothiazide W-Triamterene]     hives   Zestoretic [Lisinopril-Hydrochlorothiazide]     Swelling     Patient Measurements:   Heparin Dosing Weight: 74.6 kg  Vital Signs: Temp: 97.4 F (36.3 C) (10/22 0003) Temp Source: Axillary (10/22 0003) BP: 104/65 (10/22 0300) Pulse Rate: 95 (10/22 0300)  Labs: Recent Labs    09/14/21 0004 09/14/21 0744 09/15/21 0255 09/15/21 1443 09/16/21 0259  HGB 7.8*  --  7.8*  --  7.4*  HCT 26.9*  --  26.4*  --  25.2*  PLT 133*  --  137*  --  138*  HEPARINUNFRC 0.20*   < > 0.55 0.50 0.63  CREATININE 0.74   < > 0.62 0.55 0.55   < > = values in this interval not displayed.     Estimated Creatinine Clearance: 77.8 mL/min (by C-G formula based on SCr of 0.55 mg/dL).   Medical History: Past Medical History:  Diagnosis Date   Angioedema    COPD (chronic obstructive pulmonary disease) ([place])    FHx: migraine headaches    Hyperlipidemia    Hypertension    Seasonal allergies     Medications:  Scheduled:   chlorhexidine  15 mL Mouth Rinse BID   Chlorhexidine Gluconate Cloth  6 each Topical Daily   mouth rinse  15 mL Mouth Rinse q12n4p   Infusions:   sodium chloride 10 mL/hr at 09/16/21 0200   sodium chloride Stopped (09/14/21 0200)   acyclovir (ZOVIRAX) [phone] mg IVPB Stopped (09/15/21 2357)   amiodarone 30 mg/hr (09/16/21 0200)   ampicillin (OMNIPEN) IV Stopped (09/16/21 0144)   cefTRIAXone (ROCEPHIN)  IV Stopped (09/15/21 2207)   diltiazem (CARDIZEM) infusion Stopped (09/13/21 1817)   heparin 1,400 Units/hr (09/16/21 0200)    Assessment: 65 yo female with new atrial fibrillation/flutter.  No history of anticoagulation PTA and none received this admit.  Patient case discussed with cardiology who recommended starting patient on heparin IV.  Baseline aPTT wnl at 32 seconds and  INR 1.1.  Patient is pancytopenic likely due to chemo/radiation therapy for lung adenocarcinoma.  Pharmacy consulted to dose heparin.    Today, 09/16/21 HL 0.63 therapeutic on 1400 units/hr -Hgb down from 10 >7.4, plts up to 138 from 111 -No s/sx bleeding per RN   Goal of Therapy:  Heparin level 0.3-0.7 units/ml Monitor platelets by anticoagulation protocol: Yes   Plan:  Continue heparin infusion at 1400 untis/hr Daily HL/CBC Monitor for s/sx bleeding F/u ability to transition to oral anticoagulant  Dolly Rias RPh 09/16/2021, 4:14 AM

## 2021-09-16 NOTE — Progress Notes (Signed)
Pt requested to be taken off BiPap and placed on HFNC.... placed her on HFNC 7L and Sats are above 95%... will continue to monitor pt. O2

## 2021-09-17 ENCOUNTER — Inpatient Hospital Stay (HOSPITAL_COMMUNITY): Payer: 59

## 2021-09-17 DIAGNOSIS — Z515 Encounter for palliative care: Secondary | ICD-10-CM | POA: Diagnosis not present

## 2021-09-17 DIAGNOSIS — J9622 Acute and chronic respiratory failure with hypercapnia: Secondary | ICD-10-CM

## 2021-09-17 DIAGNOSIS — I480 Paroxysmal atrial fibrillation: Secondary | ICD-10-CM | POA: Diagnosis not present

## 2021-09-17 DIAGNOSIS — J9621 Acute and chronic respiratory failure with hypoxia: Secondary | ICD-10-CM

## 2021-09-17 DIAGNOSIS — Z7189 Other specified counseling: Secondary | ICD-10-CM | POA: Diagnosis not present

## 2021-09-17 DIAGNOSIS — C3492 Malignant neoplasm of unspecified part of left bronchus or lung: Secondary | ICD-10-CM | POA: Diagnosis not present

## 2021-09-17 DIAGNOSIS — R6510 Systemic inflammatory response syndrome (SIRS) of non-infectious origin without acute organ dysfunction: Secondary | ICD-10-CM | POA: Diagnosis not present

## 2021-09-17 LAB — COMPREHENSIVE METABOLIC PANEL
ALT: 8 U/L (ref 0–44)
AST: 10 U/L — ABNORMAL LOW (ref 15–41)
Albumin: 2.1 g/dL — ABNORMAL LOW (ref 3.5–5.0)
Alkaline Phosphatase: 71 U/L (ref 38–126)
Anion gap: 8 (ref 5–15)
BUN: 6 mg/dL — ABNORMAL LOW (ref 8–23)
CO2: 35 mmol/L — ABNORMAL HIGH (ref 22–32)
Calcium: 8.4 mg/dL — ABNORMAL LOW (ref 8.9–10.3)
Chloride: 89 mmol/L — ABNORMAL LOW (ref 98–111)
Creatinine, Ser: 0.51 mg/dL (ref 0.44–1.00)
GFR, Estimated: >60 mL/min (ref >60)
Glucose, Bld: 105 mg/dL — ABNORMAL HIGH (ref 70–99)
Potassium: 3.7 mmol/L (ref 3.5–5.1)
Sodium: 132 mmol/L — ABNORMAL LOW (ref 135–145)
Total Bilirubin: 0.4 mg/dL (ref 0.3–1.2)
Total Protein: 6.1 g/dL — ABNORMAL LOW (ref 6.5–8.1)

## 2021-09-17 LAB — CBC
HCT: 27.6 % — ABNORMAL LOW (ref 36.0–46.0)
Hemoglobin: 7.8 g/dL — ABNORMAL LOW (ref 12.0–15.0)
MCH: 25 pg — ABNORMAL LOW (ref 26.0–34.0)
MCHC: 28.3 g/dL — ABNORMAL LOW (ref 30.0–36.0)
MCV: 88.5 fL (ref 80.0–100.0)
Platelets: 131 10*3/uL — ABNORMAL LOW (ref 150–400)
RBC: 3.12 MIL/uL — ABNORMAL LOW (ref 3.87–5.11)
RDW: 18.6 % — ABNORMAL HIGH (ref 11.5–15.5)
WBC: 2.7 10*3/uL — ABNORMAL LOW (ref 4.0–10.5)
nRBC: 0 % (ref 0.0–0.2)

## 2021-09-17 LAB — BLOOD GAS, ARTERIAL
Acid-Base Excess: 12.6 mmol/L — ABNORMAL HIGH (ref 0.0–2.0)
Bicarbonate: 40.3 mmol/L — ABNORMAL HIGH (ref 20.0–28.0)
Drawn by: 860031
O2 Saturation: 87.8 %
Patient temperature: 98.6
pCO2 arterial: 81.5 mmHg (ref 32.0–48.0)
pH, Arterial: 7.315 — ABNORMAL LOW (ref 7.350–7.450)
pO2, Arterial: 56.1 mmHg — ABNORMAL LOW (ref 83.0–108.0)

## 2021-09-17 LAB — HEPARIN LEVEL (UNFRACTIONATED): Heparin Unfractionated: 0.55 IU/mL (ref 0.30–0.70)

## 2021-09-17 LAB — MAGNESIUM: Magnesium: 1.9 mg/dL (ref 1.7–2.4)

## 2021-09-17 LAB — PHOSPHORUS: Phosphorus: 3 mg/dL (ref 2.5–4.6)

## 2021-09-17 IMAGING — DX DG CHEST 1V PORT
1 series · 1 of 1 positions shown · non-contrast
Comparison: Radiograph [DATE]; CT chest [DATE]

CLINICAL DATA: Stage IIIA adenocarcinoma with large left upper lobe
mass and chest wall invasion, undergoing chemotherapy.

EXAM:
PORTABLE CHEST 1 VIEW

[chest ap]
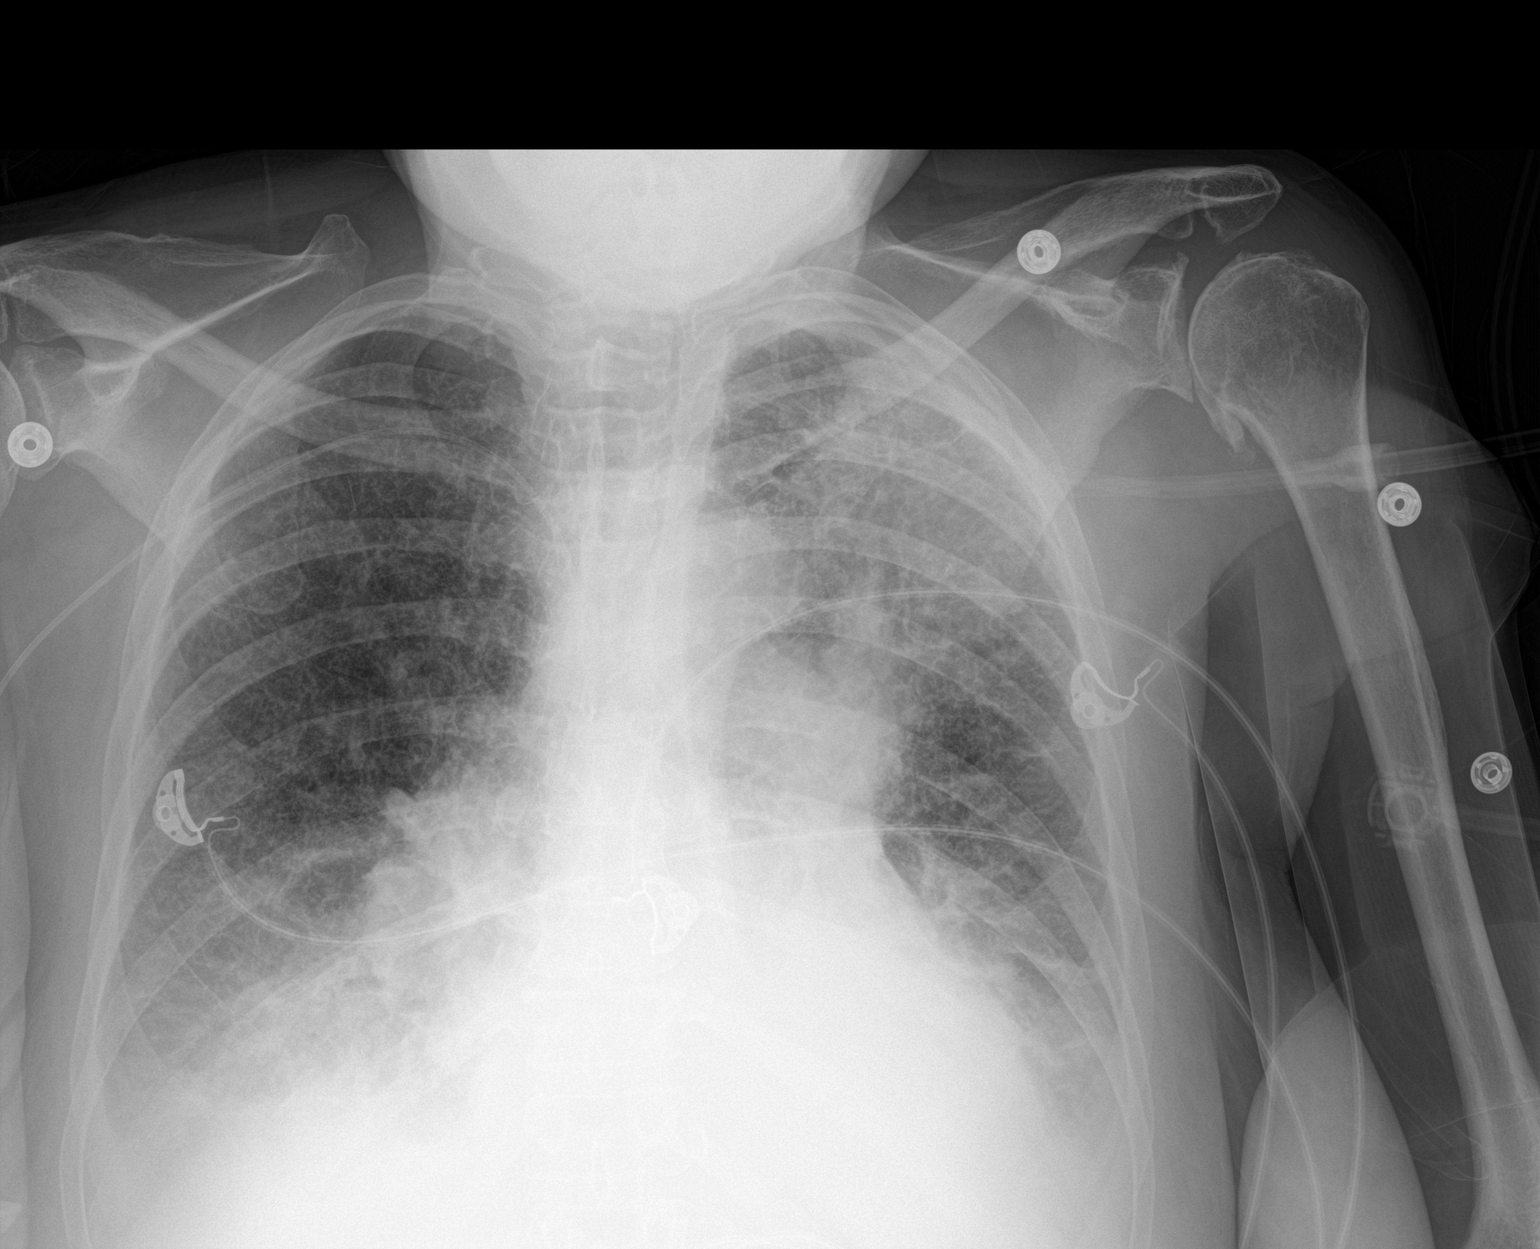

[1 of 1 positions shown; findings below may reference images not displayed]

FINDINGS: Right upper extremity PICC tip projects at the level of the upper
SVC. Nodular opacity overlying the left upper lobe, consistent with
left lung mass seen on CT [DATE]. Nodular opacity at the left
hilum corresponds to the enlarged node seen on cross-sectional
imaging. Diffuse interstitial thickening, similar to [DATE].
Small bilateral pleural effusions and increasing patchy airspace
opacities in the lung bases. No pneumothorax. Heart size is
difficult to assess due to adjacent consolidations but appears
stable.
IMPRESSION: 1. Right upper extremity PICC tip projects at the level of the upper
SVC.
2. Increasing bilateral small pleural effusions and bibasilar
airspace opacities, favored to represent atelectasis, though
infection is not excluded.
3. Diffuse hazy interstitial thickening is favored to reflect
pulmonary edema but could also represent lymphangitic
carcinomatosis.
4. No significant change in the nodular opacity in the left upper
lung, corresponding to the left upper lobe mass better appreciated
on prior cross-sectional imaging.

## 2021-09-17 MED ORDER — MAGNESIUM SULFATE 2 GM/50ML IV SOLN
2.0000 g | Freq: Once | INTRAVENOUS | Status: AC
  Administered 2021-09-17: 2 g via INTRAVENOUS
  Filled 2021-09-17: qty 50

## 2021-09-17 MED ORDER — LORAZEPAM 0.5 MG PO TABS
0.5000 mg | ORAL_TABLET | ORAL | Status: DC | PRN
  Administered 2021-09-17 – 2021-09-18 (×3): 0.5 mg via ORAL
  Filled 2021-09-17 (×4): qty 1

## 2021-09-17 MED ORDER — CHLORHEXIDINE GLUCONATE CLOTH 2 % EX PADS
6.0000 | MEDICATED_PAD | Freq: Every day | CUTANEOUS | Status: DC
  Administered 2021-09-18 – 2021-09-21 (×4): 6 via TOPICAL

## 2021-09-17 MED ORDER — VITAMIN B-12 1000 MCG PO TABS
1000.0000 ug | ORAL_TABLET | Freq: Every day | ORAL | Status: DC
  Administered 2021-09-17 – 2021-09-30 (×13): 1000 ug via ORAL
  Filled 2021-09-17 (×14): qty 1

## 2021-09-17 MED ORDER — CHLORHEXIDINE GLUCONATE CLOTH 2 % EX PADS: 6.0000 | MEDICATED_PAD | Freq: Every day | CUTANEOUS | Status: DC

## 2021-09-17 MED ORDER — TRAZODONE HCL 50 MG PO TABS
50.0000 mg | ORAL_TABLET | Freq: Once | ORAL | Status: AC
  Administered 2021-09-17: 50 mg via ORAL
  Filled 2021-09-17: qty 1

## 2021-09-17 MED ORDER — LORAZEPAM 2 MG/ML IJ SOLN
0.5000 mg | Freq: Once | INTRAMUSCULAR | Status: AC
  Administered 2021-09-17: 0.5 mg via INTRAVENOUS
  Filled 2021-09-17: qty 1

## 2021-09-17 NOTE — Progress Notes (Signed)
Daily Progress Note   Patient Name: Patricia Sampson       Date: 09/17/2021 DOB: [birth year]  Age: 65 y.o. MRN#: [id number] Attending Physician: Flora Lipps, MD Primary Care Physician: Greig Right, MD Admit Date: 09/20/2021  Reason for Consultation/Follow-up: Establishing goals of care  Subjective: I saw and examined Patricia Sampson and met with Patricia Sampson, Patricia Sampson, and his wife at the bedside.  Patricia Sampson was on the BiPAP but was sleepy after receiving Ativan due to anxiety.  She was tolerating BiPAP well at time of my encounter.  I met with Patricia Sampson and we had a long discussion regarding Patricia clinical course to this point in time.  He expressed being surprised to hear that CT scan showed progression of disease as he was under the impression that things were improving with chemoradiation.  He would like for me to reach out to clarify further with radiation oncologist if they feel new imaging displays significant progression in light of the fact she is currently receiving treatment.  We discussed the things most important to Patricia Sampson.  He reports that this is Patricia faith and Patricia family.  She is an active member in [facility] and they are very supportive of Patricia.  She has 3 children (2 biological) and 6 grandchildren.  She enjoys fishing and watching Patricia grandchildren play basketball.  Patricia Sampson is helpful that she will be able to recover to the point of being able to enjoy these things.  We discussed BiPAP, mechanical ventilation, CO2 retention, and Patricia chronic COPD and how this relates to these issues as well as Patricia cancer.  Patricia Sampson reports that family now understands the importance of compliance with BiPAP and that they will work to ensure that she wears it reliably.  She is tolerating it well  at time of my encounter (however she is sleepy due to receiving Ativan to help with this).  Length of Stay: 6  Current Medications: Scheduled Meds:  . chlorhexidine  15 mL Mouth Rinse BID  . [START ON 09/19/2021] Chlorhexidine Gluconate Cloth  6 each Topical Daily  . ipratropium-albuterol  3 mL Nebulization TID  . mouth rinse  15 mL Mouth Rinse q12n4p  . sodium chloride flush  10-40 mL Intracatheter Q12H  . vitamin B-12  1,000 mcg Oral Daily  Continuous Infusions: . sodium chloride 250 mL (09/16/21 1617)  . sodium chloride Stopped (09/17/21 2148)  . acyclovir (ZOVIRAX) [phone] mg IVPB 165 mL/hr at 09/17/21 2200  . amiodarone 30 mg/hr (09/17/21 2200)  . cefTRIAXone (ROCEPHIN)  IV 200 mL/hr at 09/17/21 2200  . heparin 1,400 Units/hr (09/17/21 2202)    PRN Meds: sodium chloride, acetaminophen **OR** acetaminophen, ipratropium-albuterol, LORazepam, ondansetron **OR** ondansetron (ZOFRAN) IV, sodium chloride flush  Physical Exam         General: Sleepy but arousable  HEENT: On BiPAP  heart: Regular rate and rhythm. No murmur appreciated. Lungs: Diminished air movement, clear Abdomen: Soft, nontender, nondistended, positive bowel sounds.   Ext: No significant edema Skin: Warm and dry  Vital Signs: BP 120/65   Pulse 99   Temp 98 F (36.7 C) (Oral)   Resp 17   Ht _0  (1.753 m)   Wt 75 kg   SpO2 96%   BMI 24.42 kg/m  SpO2: SpO2: 96 % O2 Device: O2 Device: High Flow Nasal Cannula O2 Flow Rate: O2 Flow Rate (L/min): 12 L/min  Intake/output summary:  Intake/Output Summary (Last 24 hours) at 09/17/2021 2225 Last data filed at 09/17/2021 2200 Gross per 24 hour  Intake 1823.96 ml  Output 500 ml  Net 1323.96 ml   LBM: Last BM Date:  (PTA) Baseline Weight: Weight: 75 kg Most recent weight: Weight: 75 kg       Palliative Assessment/Data:    Flowsheet Rows    Flowsheet Row Most Recent Value  Intake Tab   Referral Department Hospitalist  Unit at Time of  Referral ICU  Palliative Care Primary Diagnosis Sepsis/Infectious Disease  Date Notified 09/15/21  Palliative Care Type New Palliative care  Reason for referral Clarify Goals of Care  Date of Admission 09/03/2021  Date first seen by Palliative Care 09/16/21  # of days Palliative referral response time 1 Day(s)  # of days IP prior to Palliative referral 4  Clinical Assessment   Palliative Performance Scale Score 40%  Psychosocial & Spiritual Assessment   Palliative Care Outcomes        Patient Active Problem List   Diagnosis Date Noted  . Acute on chronic respiratory failure with hypoxia and hypercapnia (HCC)   . PAF (paroxysmal atrial fibrillation) ([place])   . Altered mental status   . SIRS (systemic inflammatory response syndrome) ([place]) 08/28/2021  . Microcytic anemia 08/21/2021  . Adenocarcinoma of left lung, stage 3 ([place]) 07/19/2021  . Encounter for antineoplastic chemotherapy 07/19/2021  . Cancer associated pain 07/19/2021  . Pneumonia due to COVID-19 virus 11/25/2019  . Shortness of breath 11/18/2019  . Chronic respiratory failure with hypoxia ([place]) 11/18/2019  . COPD (chronic obstructive pulmonary disease) ([place]) 01/27/2014  . Hypoxemia 01/27/2014  . Secondary pulmonary arterial hypertension ([place]) 01/27/2014    Palliative Care Assessment & Plan   Patient Profile: 65 y.o. female  with past medical history of severe COPD, chronic hypoxic respiratory failure on 4-8 L, hypertension, hyperlipidemia with recent diagnosis of stage III adenocarcinoma with large left upper lobe mass and chest wall invasion in August of this year who is currently undergoing chemoradiation admitted on 09/08/2021 with altered mental status with concern for sepsis.  Patricia mental status over the past several days has remained poor and far from Patricia baseline.  Palliative consulted for goals of care.  Recommendations/Plan: Full code/full scope Family requested input from oncology/radiation oncology regarding  what appears to be progression of cancer on most recent CT scan.  Patricia Sampson reports being very surprised that she would have disease progression as he thought that she was doing well with treatment.  He feels that the question of where things stand with cancer will potentially drive other decision making and family cannot make any other decisions until he understand better what is going on from a cancer perspective. Plan for follow-up meeting tomorrow at 4:30 PM.  Goals of Care and Additional Recommendations: Limitations on Scope of Treatment: Full Scope Treatment  Code Status:    Code Status Orders  (From admission, onward)           Start     Ordered   09/25/2021 2128  Full code  Continuous        09/17/2021 2127           Code Status History     This patient has a current code status but no historical code status.       Prognosis:  Unable to determine  Discharge Planning: To Be Determined  Care plan was discussed with patient's Sampson  Thank you for allowing the Palliative Medicine Team to assist in the care of this patient.   Total Time 50 Prolonged Time Billed No      Greater than 50%  of this time was spent counseling and coordinating care related to the above assessment and plan.  Micheline Rough, MD  Please contact Palliative Medicine Team phone at [phone] for questions and concerns.

## 2021-09-17 NOTE — Progress Notes (Signed)
    Interdisciplinary Goals of Care Family Meeting   Date carried out:: 09/17/2021  Location of the meeting: Bedside  Member's involved: Physician, Bedside Registered Nurse, Family Member or next of kin, and Other: Son and another female famiy member  Patricia Sampson, Patricia Sampson: husband/son    Discussion: We discussed goals of care for [name] .  Explained Progression of cancer on CT 09/25/2021. He was not aware of this result. Explained that she is refusing BiPAP and reluctant to have ABG but getting hypercarbic and verge of intubation but being non commital on CPR/INtubation status . Explained that to help with communication and setting goals - called palliative care. He wanted to make sure that agenda was not hospice. Explained the difference and parallels between palliative care and hospice. He is willing to meet with Dr Domingo Cocking. HE says he will make sure his mom wears BiPAP  Code status: Full Code  Disposition: Continue current acute care  Time spent for the meeting: 15 min  Patricia Sampson 09/17/2021, 1:22 PM

## 2021-09-17 NOTE — Progress Notes (Signed)
Patient son request RN into room as patient was complaining of trouble breathing. Patient stated she wanted to give the BIPAP a try to see if she could tolerate. Patient discussed with RN about anxiety that the BIPAP mask caused her the first trial. Pokhrel MD notified of concerns and 0.5 mg Ativan ordered for anxiety and patient placed on BIPAP at 1300.

## 2021-09-17 NOTE — Progress Notes (Signed)
ANTICOAGULATION CONSULT NOTE - follow up  Pharmacy Consult for heparin Indication: atrial fibrillation  Allergies  Allergen Reactions   Dyazide [Hydrochlorothiazide W-Triamterene]     hives   Zestoretic [Lisinopril-Hydrochlorothiazide]     Swelling     Patient Measurements: Height: _0  (175.3 cm) Weight: 75 kg (165 lb 5.5 oz) IBW/kg (Calculated) : 66.2 Heparin Dosing Weight: 74.6 kg  Vital Signs: Temp: 97.8 F (36.6 C) (10/22 2300) Temp Source: Axillary (10/22 2300) BP: 108/54 (10/23 0000) Pulse Rate: 103 (10/22 2300)  Labs: Recent Labs    09/15/21 0255 09/15/21 1443 09/16/21 0259 09/17/21 0420  HGB 7.8*  --  7.4* 7.8*  HCT 26.4*  --  25.2* 27.6*  PLT 137*  --  138* 131*  HEPARINUNFRC 0.55 0.50 0.63 0.55  CREATININE 0.62 0.55 0.55 0.51     Estimated Creatinine Clearance: 75.2 mL/min (by C-G formula based on SCr of 0.51 mg/dL).   Medical History: Past Medical History:  Diagnosis Date   Angioedema    COPD (chronic obstructive pulmonary disease) ([place])    FHx: migraine headaches    Hyperlipidemia    Hypertension    Seasonal allergies     Medications:  Scheduled:   chlorhexidine  15 mL Mouth Rinse BID   Chlorhexidine Gluconate Cloth  6 each Topical Daily   ipratropium-albuterol  3 mL Nebulization TID   mouth rinse  15 mL Mouth Rinse q12n4p   sodium chloride flush  10-40 mL Intracatheter Q12H   Infusions:   sodium chloride 250 mL (09/16/21 1617)   sodium chloride 10 mL/hr at 09/16/21 1922   acyclovir (ZOVIRAX) [phone] mg IVPB Stopped (09/16/21 2242)   amiodarone 30 mg/hr (09/16/21 2049)   ampicillin (OMNIPEN) IV 2 g (09/17/21 0436)   cefTRIAXone (ROCEPHIN)  IV Stopped (09/16/21 2219)   heparin 1,400 Units/hr (09/16/21 1900)    Assessment: 65 yo female with new atrial fibrillation/flutter.  No history of anticoagulation PTA and none received this admit.  Patient case discussed with cardiology who recommended starting patient on heparin IV.   Baseline aPTT wnl at 32 seconds and INR 1.1.  Patient is pancytopenic likely due to chemo/radiation therapy for lung adenocarcinoma.  Pharmacy consulted to dose heparin.    Today, 09/17/21 HL 0.55 therapeutic on 1400 units/hr -Hgb 7.8  plts 131 -No s/sx bleeding per RN   Goal of Therapy:  Heparin level 0.3-0.7 units/ml Monitor platelets by anticoagulation protocol: Yes   Plan:  Continue heparin infusion at 1400 untis/hr Daily HL/CBC Monitor for s/sx bleeding F/u ability to transition to oral anticoagulant  Dolly Rias RPh 09/17/2021, 5:14 AM

## 2021-09-17 NOTE — Plan of Care (Signed)
Discussed with patient an husband plan of care for the evening, pain management, respiratory needs and medications to assist with wearing the Bipap with some teach back displayed.  Problem: Education: Goal: Knowledge of General Education information will improve Description: Including pain rating scale, medication(s)/side effects and non-pharmacologic comfort measures Outcome: Progressing   Problem: Health Behavior/Discharge Planning: Goal: Ability to manage health-related needs will improve Outcome: Progressing

## 2021-09-17 NOTE — Progress Notes (Signed)
Progress Note  Patient Name: Patricia Sampson Date of Encounter: 09/17/2021  [facility] Cardiologist: Nahser  Subjective   Ongoing SOB  Inpatient Medications    Scheduled Meds:  chlorhexidine  15 mL Mouth Rinse BID   Chlorhexidine Gluconate Cloth  6 each Topical Daily   ipratropium-albuterol  3 mL Nebulization TID   mouth rinse  15 mL Mouth Rinse q12n4p   sodium chloride flush  10-40 mL Intracatheter Q12H   Continuous Infusions:  sodium chloride 250 mL (09/16/21 1617)   sodium chloride 10 mL/hr at 09/16/21 1922   acyclovir (ZOVIRAX) [phone] mg IVPB Stopped (09/17/21 8841)   amiodarone 30 mg/hr (09/16/21 2049)   ampicillin (OMNIPEN) IV Stopped (09/17/21 6606)   cefTRIAXone (ROCEPHIN)  IV Stopped (09/16/21 2219)   heparin 1,400 Units/hr (09/16/21 1900)   PRN Meds: sodium chloride, acetaminophen **OR** acetaminophen, ipratropium-albuterol, ondansetron **OR** ondansetron (ZOFRAN) IV, sodium chloride flush   Vital Signs    Vitals:   09/17/21 0300 09/17/21 0400 09/17/21 0451 09/17/21 0500  BP: (!) 102/46 (!) 98/52  112/62  Pulse:    99  Resp: 12 13  (!) 24  Temp:      TempSrc:      SpO2:   94% (!) 89%  Weight:      Height:        Intake/Output Summary (Last 24 hours) at 09/17/2021 0724 Last data filed at 09/16/2021 2000 Gross per 24 hour  Intake 1460.67 ml  Output 600 ml  Net 860.67 ml   Last 3 Weights 09/16/2021 09/16/2021 09/04/2021  Weight (lbs) 165 lb 5.5 oz 165 lb 5.5 oz 164 lb 8 oz  Weight (kg) 75 kg 75 kg 74.617 kg      Telemetry    SR - Personally Reviewed  ECG    N/a - Personally Reviewed  Physical Exam   GEN: No acute distress.   Neck: No JVD Cardiac: RRR, no murmurs, rubs, or gallops.  Respiratory: Clear to auscultation bilaterally. GI: Soft, nontender, non-distended  MS: No edema; No deformity. Neuro:  Nonfocal  Psych: Normal affect   Labs    High Sensitivity Troponin:  No results for input(s): TROPONINIHS in the last 720  hours.   Chemistry Recent Labs  Lab 09/12/21 0248 09/14/21 0004 09/14/21 2141 09/15/21 0255 09/15/21 1443 09/16/21 0259 09/17/21 0420  NA 137 139   < > 136 134* 134* 132*  K 3.4* 3.3*   < > 3.0* 3.3* 3.1* 3.7  CL 92* 97*   < > 93* 92* 91* 89*  CO2 33* 34*   < > 33* 36* 37* 35*  GLUCOSE 84 112*   < > 118* 125* 116* 105*  BUN 15 14   < > 10 8 7* 6*  CREATININE 0.63 0.74   < > 0.62 0.55 0.55 0.51  CALCIUM 8.3* 7.4*   < > 7.8* 8.2* 8.2* 8.4*  MG  --  1.2*   < > 1.7  --  1.9 1.9  PROT 6.2* 5.5*  --   --   --   --  6.1*  ALBUMIN 2.4* 2.2*  --   --   --   --  2.1*  AST 14* 14*  --   --   --   --  10*  ALT 10 9  --   --   --   --  8  ALKPHOS 72 62  --   --   --   --  71  BILITOT 0.6  0.5  --   --   --   --  0.4  GFRNONAA >60 >60   < > >60 >60 >60 >60  ANIONGAP 12 8   < > _0 < > = values in this interval not displayed.    Lipids No results for input(s): CHOL, TRIG, HDL, LABVLDL, LDLCALC, CHOLHDL in the last 168 hours.  Hematology Recent Labs  Lab 09/15/21 0255 09/16/21 0259 09/17/21 0420  WBC 2.9* 2.4* 2.7*  RBC 3.07* 2.88* 3.12*  HGB 7.8* 7.4* 7.8*  HCT 26.4* 25.2* 27.6*  MCV 86.0 87.5 88.5  MCH 25.4* 25.7* 25.0*  MCHC 29.5* 29.4* 28.3*  RDW 18.8* 18.6* 18.6*  PLT 137* 138* 131*   Thyroid  Recent Labs  Lab 09/12/21 0855  TSH 2.466    BNPNo results for input(s): BNP, PROBNP in the last 168 hours.  DDimer No results for input(s): DDIMER in the last 168 hours.   Radiology    DG CHEST PORT 1 VIEW  Result Date: 09/15/2021 CLINICAL DATA:  Dyspnea and respiratory abnormality EXAM: PORTABLE CHEST 1 VIEW COMPARISON:  CT chest angiography 09/07/2021 FINDINGS: The heart and mediastinal contours are within normal limits. Bibasilar patchy airspace opacities. Left upper lobe airspace opacity. Increased and coarsened interstitial markings. No pulmonary edema. Trace right pleural effusion. Possible trace left pleural effusion. No pneumothorax. No acute osseous abnormality.  Degenerative changes of bilateral shoulders. IMPRESSION: 1. Interval development of bibasilar patchy airspace opacities which may represent a combination of infection/inflammation and/or atelectasis. 2. Trace right pleural effusion. Possible trace left pleural effusion. 3. Left upper lobe airspace opacity consistent with known primary bronchogenic carcinoma better evaluated on CT angiography chest 09/06/2021. 4.  Emphysema (ICD10-J43.9). Electronically Signed   By: Iven Finn M.D.   On: 09/15/2021 15:08   [place] EKG SITE RITE  Result Date: 09/15/2021 If Site Rite image not attached, placement could not be confirmed due to current cardiac rhythm.   Cardiac Studies   08/2021 echo IMPRESSIONS     1. Left ventricular ejection fraction, by estimation, is 55 to 60%. The  left ventricle has normal function. The left ventricle has no regional  wall motion abnormalities. Left ventricular diastolic parameters are  consistent with Grade I diastolic  dysfunction (impaired relaxation).   2. There is mild respirophasic variation of interventricular septum  position, possibly due to the patient's underlying lung disease. Right  ventricular systolic function is normal. The right ventricular size is  normal.   3. The mitral valve is normal in structure. No evidence of mitral valve  regurgitation. No evidence of mitral stenosis.   4. The aortic valve is tricuspid. Aortic valve regurgitation is not  visualized. No aortic stenosis is present.   5. The inferior vena cava is dilated in size with <50% respiratory  variability, suggesting right atrial pressure of 15 mmHg.   Patient Profile     65 y.o. female with COPD, non-small cell lung cancer, pulmonary hypertension who now has been found to have paroxysmal A. fib.  Assessment & Plan    1.PAF - new diagnosis this admission, occurred in the setting of sepsis.  - converted to SR on IV amio. Have not converated to oral due to being on bipap and some  recent dysphagia. Can convert if remains off bipap with improved consistent oral intake.  - she is on hep gtt, can convert to oral eliquis when ok from ICU/internal medicine standpoint and taking oral more regularly.  - Plan  would be for short course of oral amiodarone, afib may have been driven by her sepsis, with lung disease would want to limit overall course as well.      2. Respiratory failure/Severe COPD - followed by pulmonary, has been on bipap   3. Lung cancer  For questions or updates, please contact [place] Please consult [email or web address] for contact info under        Signed, Carlyle Dolly, MD  09/17/2021, 7:24 AM

## 2021-09-17 NOTE — Progress Notes (Addendum)
PROGRESS NOTE  Patricia Sampson [id number]:[id number] DOB: [birth year] DOA: 09/10/2021 PCP: Greig Right, MD   LOS: 6 days   Brief narrative:  Patricia Sampson is a 65 y.o. 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  In the ED, patient was tachypneic and tachycardic and had pancytopenia.  Last chemotherapy was 09/05/2021.  Chest x-ray and CT head scan was negative.  Patient was started on broad-spectrum antibiotics for possible sepsis and patient was admitted to the hospital.    Assessment/Plan:  Principal Problem:   SIRS (systemic inflammatory response syndrome) (HCC) Active Problems:   COPD (chronic obstructive pulmonary disease) (HCC)   Chronic respiratory failure with hypoxia (HCC)   Altered mental status   PAF (paroxysmal atrial fibrillation) (HCC)   Acute on chronic respiratory failure with hypoxia and hypercapnia (HCC)  Acute metabolic encephalopathy.  Improved.  Patient was encouraged on using BiPAP at nighttime and had a prolonged discussion about it..  Patient was seen by neurology who recommended lumbar puncture but patient has denied it.  Empirically on antibiotic for possible meningitis.  Currently on acyclovir Rocephin and ampicillin.  We will follow infectious disease recommendations on further need for antibiotics.  Blood culture urine culture negative so far.  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    Low vitamin B-12.  Goal more than 400.  Start oral vitamin B12  Paroxysmal A. fib with RVR.  Cardiology on board.  On heparin drip.  On amiodarone drip.  CHA2DS2-VASc score of 2.  Pancytopenia noted.  Risk of bleeding from anticoagulation in the long-term.  Rate  controlled at this time.  Hypokalemia and hypomagnesia.  Improved after replacement.  Acute on chronic hypoxic respiratory failure.  Patient has severe COPD and 4 to 6 L of oxygen at baseline.  Patient required nonrebreather mask on admission.  PCCM on board.  Cussed about at least nocturnal BiPAP as well as the patient's husband at bedside.  Spoke with critical care at bedside as well patient is very poor candidate for mechanical ventilation.  Currently on Unasyn.  Continue duo nebs intensive spirometry.  Adenocarcinoma of the lung on chemoradiation.  Stage IIIa with chest wall invasion.  With pancytopenia on presentation.  Follows up with Dr. Theda [place] as outpatient.  Palliative care has been consulted for goals of care.  Pancytopenia.  Mild at this time.  We will continue to monitor.  Difficult IV access.  Status post PICC line placement.  Ethics/goals of care patient with multiple comorbidities and lung cancer.  .  Overall prognosis is guarded.  Spoke with the patient's familyat bedside.  Still full code.  Very likely to have adverse outcome if CPR or intubation is planned.  Spoke with the patient 's family at bedside. Will need continued discussion with the family.  Palliative care on board  DVT prophylaxis: SCDs Start: 08/27/2021 2128   Code Status: Full code  Family Communication: Spoke with the patient's family at bedside.  Status is: Inpatient  Remains inpatient appropriate because:  on multiple IV drips and IV antibiotics, respiratory failure,  Consultants: PCCM Infectious disease Neurology  Procedures: EEG BiPAP placement  Anti-infectives:  Acyclovir Ampicillin Rocephin   Subjective: Today, patient was seen and examined at bedside.  Patient  had required frequent albuterol treatment during the nighttime.  Patient's family at bedside.  Had refused to have BiPAP and is still refusing lumbar puncture.    Objective: Vitals:   09/17/21 0830 09/17/21 0900  BP:  124/65   Pulse:  99  Resp:  (!) 25  Temp: (!) 97.5 F (36.4 C)   SpO2: 92% (!) 85%    Intake/Output Summary (Last 24 hours) at 09/17/2021 1036 Last data filed at 09/17/2021 0800 Gross per 24 hour  Intake 1150.39 ml  Output 625 ml  Net 525.39 ml    Filed Weights   09/16/21 1200 09/16/21 1600  Weight: 75 kg 75 kg   Body mass index is 24.42 kg/m.   Physical Exam:  GENERAL: Patient is awake and oriented to place and person.  Communicative, appears ill and deconditioned.  On high flow nasal cannula,  HENT: No scleral pallor or icterus. Pupils equally reactive to light. Oral mucosa is moist NECK: is supple, no gross swelling noted. CHEST: Decreased breath sounds bilaterally.  Coarse breath sounds noted. CVS: S1 and S2 heard, no murmur. Regular rate and rhythm.  ABDOMEN: Soft, non-tender, bowel sounds are present. EXTREMITIES: No edema. CNS: Alert awake and oriented to place and person SKIN: warm and dry without rashes.  Data Review: I have personally reviewed the following laboratory data and studies,  CBC: Recent Labs  Lab 09/09/2021 1155 09/06/2021 1201 09/13/21 0254 09/14/21 0004 09/15/21 0255 09/16/21 0259 09/17/21 0420  WBC 2.0*   < > 2.1* 2.9* 2.9* 2.4* 2.7*  NEUTROABS 1.5*  --  1.5*  --  2.2 1.8  --   HGB 10.0*   < > 8.3* 7.8* 7.8* 7.4* 7.8*  HCT 33.6*   < > 28.4* 26.9* 26.4* 25.2* 27.6*  MCV 84.2   < > 87.4 86.5 86.0 87.5 88.5  PLT 139*   < > 129* 133* 137* 138* 131*   < > = values in this interval not displayed.    Basic Metabolic Panel: Recent Labs  Lab 09/14/21 0004 09/14/21 2141 09/15/21 0255 09/15/21 1443 09/16/21 0259 09/17/21 0420  NA 139 135 136 134* 134* 132*  K 3.3* 3.0* 3.0* 3.3* 3.1* 3.7  CL 97* 94* 93* 92* 91* 89*  CO2 34* 30 33* 36* 37* 35*  GLUCOSE 112* 109* 118* 125* 116* 105*  BUN _0 7* 6*  CREATININE 0.74 0.57 0.62 0.55 0.55 0.51  CALCIUM 7.4* 7.9* 7.8* 8.2* 8.2* 8.4*  MG 1.2* 1.9 1.7  --  1.9 1.9  PHOS  --   --  2.8  --   --   3.0    Liver Function Tests: Recent Labs  Lab 09/04/2021 1155 09/12/21 0248 09/14/21 0004 09/17/21 0420  AST 17 14* 14* 10*  ALT _1 ALKPHOS 73 72 62 71  BILITOT 0.6 0.6 0.5 0.4  PROT 7.1 6.2* 5.5* 6.1*  ALBUMIN 2.7* 2.4* 2.2* 2.1*    No results for input(s): LIPASE, AMYLASE in the last 168 hours. Recent Labs  Lab 08/28/2021 1155 09/14/2021 2302  AMMONIA 18 31    Cardiac Enzymes: No results for input(s): CKTOTAL, CKMB, CKMBINDEX, TROPONINI in the last 168 hours. BNP (last 3 results) No results for input(s): BNP in the last 8760 hours.  ProBNP (last 3 results) No results for input(s): PROBNP in the last 8760 hours.  CBG: No results for input(s): GLUCAP in the last 168 hours. Recent Results (from the past 240 hour(s))  Blood Culture (routine  x 2)     Status: None   Collection Time: 09/09/2021 11:55 AM   Specimen: BLOOD RIGHT HAND  Result Value Ref Range Status   Specimen Description   Final    BLOOD RIGHT HAND Performed at [facility] [address] [place], [place] [address]    Special Requests   Final    BOTTLES DRAWN AEROBIC AND ANAEROBIC Blood Culture adequate volume Performed at [facility] [address], [place], [place] [address]    Culture   Final    NO GROWTH 5 DAYS Performed at [facility], [place] [address] [place], [place] [address]    Report Status 09/16/2021 FINAL  Final  Blood Culture (routine x 2)     Status: None   Collection Time: 09/24/2021 11:55 AM   Specimen: BLOOD  Result Value Ref Range Status   Specimen Description   Final    BLOOD LEFT ANTECUBITAL Performed at [facility] [address], [place], [place] [address]    Special Requests   Final    BOTTLES DRAWN AEROBIC AND ANAEROBIC Blood Culture results may not be optimal due to an excessive volume of blood received in culture bottles Performed at [facility] [address], [place], [place]  [address]    Culture   Final    NO GROWTH 5 DAYS Performed at [facility], [place] [address], [place], [place] [address]    Report Status 09/16/2021 FINAL  Final  Resp Panel by RT-PCR (Flu A&B, Covid) Nasopharyngeal Swab     Status: None   Collection Time: 09/20/2021  1:50 PM   Specimen: Nasopharyngeal Swab; Nasopharyngeal(NP) swabs in vial transport medium  Result Value Ref Range Status   SARS Coronavirus 2 by RT PCR NEGATIVE NEGATIVE Final    Comment: (NOTE) SARS-CoV-2 target nucleic acids are NOT DETECTED.  The SARS-CoV-2 RNA is generally detectable in upper respiratory specimens during the acute phase of infection. The lowest concentration of SARS-CoV-2 viral copies this assay can detect is 138 copies/mL. A negative result does not preclude SARS-Cov-2 infection and should not be used as the sole basis for treatment or other patient management decisions. A negative result may occur with  improper specimen collection/handling, submission of specimen other than nasopharyngeal swab, presence of viral mutation(s) within the areas targeted by this assay, and inadequate number of viral copies(<138 copies/mL). A negative result must be combined with clinical observations, patient history, and epidemiological information. The expected result is Negative.  Fact Sheet for Patients:  EntrepreneurPulse.com.au  Fact Sheet for Healthcare Providers:  IncredibleEmployment.be  This test is no t yet approved or cleared by the [place] FDA and  has been authorized for detection and/or diagnosis of SARS-CoV-2 by FDA under an Emergency Use Authorization (EUA). This EUA will remain  in effect (meaning this test can be used) for the duration of the COVID-19 declaration under Section 564(b)(1) of the Act, 21 U.S.C.section 360bbb-3(b)(1), unless the authorization is terminated  or revoked sooner.       Influenza A by PCR NEGATIVE NEGATIVE Final   Influenza  B by PCR NEGATIVE NEGATIVE Final    Comment: (NOTE) The Xpert Xpress SARS-CoV-2/FLU/RSV plus assay is intended as an aid in the diagnosis of influenza from Nasopharyngeal swab specimens and should not be used as a sole basis for treatment. Nasal washings and aspirates are unacceptable for Xpert Xpress SARS-CoV-2/FLU/RSV testing.  Fact Sheet for Patients: EntrepreneurPulse.com.au  Fact Sheet  for Healthcare Providers: IncredibleEmployment.be  This test is not yet approved or cleared by the [place] and has been authorized for detection and/or diagnosis of SARS-CoV-2 by FDA under an Emergency Use Authorization (EUA). This EUA will remain in effect (meaning this test can be used) for the duration of the COVID-19 declaration under Section 564(b)(1) of the Act, 21 U.S.C. section 360bbb-3(b)(1), unless the authorization is terminated or revoked.  Performed at [facility], [place] [address] [place], [place] [address]   Urine Culture     Status: None   Collection Time: 08/30/2021  2:57 PM   Specimen: Urine, Random  Result Value Ref Range Status   Specimen Description   Final    URINE, RANDOM Performed at [facility] [address], [place], [place] [address]    Special Requests   Final    NONE Performed at [facility], [place] [address], [place], [place] [address]    Culture   Final    NO GROWTH Performed at [facility], [place] [address] [place], [place] [address]    Report Status 09/12/2021 FINAL  Final  MRSA Next Gen by PCR, Nasal     Status: None   Collection Time: 09/02/2021  9:13 PM   Specimen: Nasal Mucosa; Nasal Swab  Result Value Ref Range Status   MRSA by PCR Next Gen NOT DETECTED NOT DETECTED Final    Comment: (NOTE) The GeneXpert MRSA Assay (FDA approved for NASAL specimens only), is one component of a comprehensive MRSA colonization surveillance program. It is not  intended to diagnose MRSA infection nor to guide or monitor treatment for MRSA infections. Test performance is not FDA approved in patients less than 79 years old. Performed at [facility], [place] [address] [place], [place] [address]       Studies: DG CHEST PORT 1 VIEW  Result Date: 09/15/2021 CLINICAL DATA:  Dyspnea and respiratory abnormality EXAM: PORTABLE CHEST 1 VIEW COMPARISON:  CT chest angiography 09/01/2021 FINDINGS: The heart and mediastinal contours are within normal limits. Bibasilar patchy airspace opacities. Left upper lobe airspace opacity. Increased and coarsened interstitial markings. No pulmonary edema. Trace right pleural effusion. Possible trace left pleural effusion. No pneumothorax. No acute osseous abnormality. Degenerative changes of bilateral shoulders. IMPRESSION: 1. Interval development of bibasilar patchy airspace opacities which may represent a combination of infection/inflammation and/or atelectasis. 2. Trace right pleural effusion. Possible trace left pleural effusion. 3. Left upper lobe airspace opacity consistent with known primary bronchogenic carcinoma better evaluated on CT angiography chest 09/23/2021. 4.  Emphysema (ICD10-J43.9). Electronically Signed   By: Iven Finn M.D.   On: 09/15/2021 15:08      Flora Lipps, MD  [facility] 09/17/2021  If 7PM-7AM, please contact night-coverage

## 2021-09-17 NOTE — Progress Notes (Signed)
Brief ID note:  Mental status appears better over the last day or so from what has been charted.  She continues on ampicillin, ceftriaxone, acyclovir for potential meningitis.  Afebrile over the last 24 hours with T-max 97.8.  Cultures remain negative and she was unable to undergo lumbar puncture to help rule in or rule out an infectious etiology to her presentation.  For now, will continue ceftriaxone and acyclovir.  Will stop ampicillin at this time.  Dr. Linus Salmons will be back tomorrow.    Raynelle Highland for Infectious Disease [facility] 09/17/2021, 7:56 AM

## 2021-09-17 NOTE — Progress Notes (Signed)
RT NOTE:  RT obtained ABG on pt per CCMD order, husband expressed some frustrations about having to stick his wife for more blood and was wondering why RT was doing so. RT explained that we were looking for any CO2 retention changes. Pt also very adamant about not wanting to go on the BiPAP machine.

## 2021-09-17 NOTE — Progress Notes (Signed)
NAME:  Patricia Sampson, MRN:  [id number], DOB:  1956-04-22, LOS: 6 ADMISSION DATE:  09/17/2021, CONSULTATION DATE: 09/17/2021 REFERRING MD: Dr. Marylyn Ishihara, CHIEF COMPLAINT: Encephalopathy  BRIEF  65 year old woman with very severe COPD, associated chronic hypoxemic respiratory failure (4-8 L/min), hypertension, hyperlipidemia.  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 She was noted to have altered mental status on the morning of 10/17, appeared to be normal when she went to bed the night before.  In the emergency department she was tachycardic, tachypneic with pancytopenia.  Last chemotherapy infusion appears to have been 09/05/2021.  Acute metabolic encephalopathy.  She was treated empirically for possible sepsis, source unknown. Home med list with Percocet, Neurontin.  Her husband notes that he does not believe she is been taking any of her medicines for the last 2 to 3 days.   Pertinent  Medical History   Past Medical History:  Diagnosis Date   Angioedema    COPD (chronic obstructive pulmonary disease) (HCC)    FHx: migraine headaches    Hyperlipidemia    Hypertension    Seasonal allergies     Significant Hospital Events: Including procedures, antibiotic start and stop dates in addition to other pertinent events   CT abdomen pelvis 10/17 >> no acute findings Head CT 10/17 >> normal CT-PA chest 10/17 >> no new infiltrates or significant change in the left upper lobe mass.  No PE MRI brain 10/17 >> Motion degradation compromise interpretation, no acute or subacute infarct, no mass lesion  Progressive left upper lobe primary bronchogenic carcinoma with increased destruction of the anterior left second rib. New perilymphatic nodularity in the left upper lobe adjacent to the mass, consistent with lymphangitic carcinomatosis. 3. 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  EEG 10/18 with  generalized slowing, no evidence of active seizures or epileptiform activity 10/18 acyclovir and ampicillin added to cover possible meningitis 10/19 SVT, likely A. fib/flutter noted after adenosine given.  Hypotension with diltiazem, then started amiodarone.  Back in sinus tachycardia 10/20 - SVT with what looks like A. fib/flutter after adenosine given early evening 10/19.  No real response to diltiazem infusion, then given amiodarone.  Back in sinus tachycardia, . \atient continued to be aphasic and to have agitation through the day 10/19.  Now off Precedex. I/O positive 8.7 L total. Has had hypokalemia, replaced. WBC increasing, 2.9 this morning 10/20  10/21 -husband and son at the bedside.  They tell me that after she had COVID in 2020 she has been on 6 L oxygen.  For the last 3 months before admissions she has had 35 pound weight loss approximately.  August 2022 was diagnosed with cancers in September 2022 was on chemoradiation.  Since chemoradiation there is more failure to thrive with progressive decrease in activity particularly after chemoradiation cycles. Currently on 10 L nasal cannula pulse ox 95%.  Has not been on BiPAP.  Has not been on the ventilator.  Is not on pressors.  Not on Precedex  10/22 -had severe worsening of respiratory acidosis acute on chronic with a pH of 7.22 and a PCO2 of 90 associated with worsening bilateral lower lobe atelectasis [has left upper lobe cancer] on chest x-ray compared to 09/21/2021.  Treated with BiPAP.  Blood gas subsequent improvement but did not tolerate BiPAP.  Nonetheless mental status today better.  Husband also acknowledges that she is looking better.  Cachectic elderly female.  Significant physical deconditioning .  currently on 6 L nasal cannula pulse ox 95%. Continues on IV heparin infusion and amio infusion.  On acyclovir and ampicillin and ceftriaxone for potential meningitis.  Afebrile since 09/13/2021 but leukopenic with a white count of 2.4K.   Culture negative so far Pall care consult - full code + but family meeting being arranged   Interim History / Subjective:   10/23 - now up at 12L [place]. Repeated refusing bipap.  Had refused LP as well. Afebrile since 10/19. Ampicilling stopped by ID.  Ceftiraxone and acyclovir being continued.  On IVamio and IV heparin. Cards ok with change to eilquis from heparin if patient can swallow. Still awake and wathcing TV. Husband next to her. AGain refused BiPAP due to claustrophobia. Discussed code status with presence of Dr Louanne Belton - husband and she non-committal but they do understand refusing BiPAP increases risk for intubation. Made them aware of cancer progression with bony met  Objective   Blood pressure 124/65, pulse 99, temperature (!) 97.5 F (36.4 C), temperature source Oral, resp. rate (!) 25, height _0  (1.753 m), weight 75 kg, SpO2 (!) 85 %.      Estimated body mass index is 24.42 kg/m as calculated from the following:   Height as of this encounter: _1  (1.753 m).   Weight as of this encounter: 75 kg.   Intake/Output Summary (Last 24 hours) at 09/17/2021 0927 Last data filed at 09/17/2021 0800 Gross per 24 hour  Intake 1275.61 ml  Output 625 ml  Net 650.61 ml   Filed Weights   09/16/21 1200 09/16/21 1600  Weight: 75 kg 75 kg    Examination: General Appearance:  Cachectic. Sitting in bed. Frail. [place] TV Head:  Normocephalic, without obvious abnormality, atraumatic Eyes:  PERRL - yes, conjunctiva/corneas - muddy     Ears:  Normal external ear canals, both ears Nose:  G tube - no bu has [place] 12L - pulse 90% Throat:  ETT TUBE - no.  Neck:  Supple,  No enlargement/tenderness/nodules Lungs: Clear to auscultation bilaterally - DIminished overall Heart:  S1 and S2 normal, no murmur, CVP - no.  Pressors - no Abdomen:  Soft, no masses, no organomegaly Genitalia / Rectal:  Not done Extremities:  Extremities- intact Skin:  ntact in exposed areas . LEFT UPPER CHEST -  RAdiation burn with hyperpigmentation Sacral area - not examined Neurologic:  Sedation - none -> RASS - +1 . Moves all 4s - yes. CAM-ICU - neg . Orientation - x3+        Resolved Hospital Problem list     Assessment & Plan:  Acute encephalopathy, appreciate neurology assistance with the case.  Some evolving evidence for an agitated delirium.  Still considering a toxic metabolic cause, no clear source of infection but consider meningitis.  LP deferred due to associated risk of performing.  No evidence for CVA, no evidence for active seizures or recent seizure.  Vitamin B12 borderline low.  She is on gabapentin at home but unlikely for this to represent withdrawal.  Off Precedex since 09/13/2021    09/17/2021  - Normal mental stauts x 36h since using BiPAP and improvement in hypercarbia. REfuses to use BiPAP QHS at this point. ID narrowing meningitis coverage.    Plan - meningitis coverage antibiotics per ID -Avoid hypercarbia and acidosis but she rfuses BiPAP -Careful with sedating medications -Ensure adequate oxygenation -for racial pulse ox -pulse ox goal greater than equal to 92% -B12 supplementation   History of COVID in 2020  and oxygen dependent since then per history Acute on chronic hypoxemic respiratory failure.  She has severe COPD, is on 6-8 L/min at baseline. Severe COPD  09/17/2021: 6 L oxygen, pulse ox 95% yesterday to 12L [place] pulse ox 91% today ( Had severe worsening and hypercarbic respiratory failure 09/16/2019 improved after using BiPAP for a few hours but then became intolerant_  Plan -BiPAP nightly mandatory [but she is refusing) - recheck abg 10/23 - recheck cxr 10/23 -Continue O2, goal SPO2 > 92%. -Not an ideal intubation candidate [goals of care ongoing but currently full code] esp refusing biPAP and worsening lung cancer       SVT 10/19.- PAF - card consult since 09/14/21  10/21 and 09/16/2021- on amio gtt and heparin gtt  Plan  - per cards an  dtriad   Adenocarcinoma of the lung, on chemoradiation.   - Prior to & Present on Admit  - Stage IIIA (T4, N1, M0) non-small cell lung cancer, adenocarcinoma presented with large left upper lobe lung mass with chest wall invasion in addition to left suprahilar lymphadenopathy diagnosed in August 2022.  - Progression with rib destruction /bony met - happened prior to  admit but diagnosed post admit on CT  Plan  - consider Onc consult for prognsosis if needed  Anemia - Prior to & Present on Admit - baseline 10gm% Pancytopenia (new) on presentation  09/08/2021 -with reductions in white count and platelets. without any clear evidence for infection or evidence for bleeding.   10/23 -platelet 137 stable, white count 2.7K and stable hemoglobin in the 7s  -  stable v some improved;  no active bleeding  plan -Continue to follow CBC -- PRBC for hgb </= 6.9gm%    - exceptions are   -  if ACS susepcted/confirmed then transfuse for hgb </= 8.0gm%,  or    -  active bleeding with hemodynamic instability, then transfuse regardless of hemoglobin value   At at all times try to transfuse 1 unit prbc as possible with exception of active hemorrhage   Mild hypomagnesmia 09/17/21  Plan  - replete   FAilure to thrive with > 30# weight  loss - Prior to & Present on Admit Cancer pain on chronic opioid s - Prior to & Present on Admit Severe Protein calorie malnutiion - baseline alb 2.6   - Prior to & Present on Admit   Plan  - per triad - consider pallaitive care consult     Best Practice (right click and "Reselect all SmartList Selections" daily)   Diet/type: Regular consistency (see orders) DVT prophylaxis: other GI prophylaxis: N/A Lines: N/A Foley:  N/A Code Status:  full code Last date of multidisciplinary goals of care discussion [10/17. Discussed GOC briefly with pt's husband 10/17 -- need further discussions and would like to include pt, her son. She is full code at this  time.]  Family: Updated son at bedside on 10/20 and 09/15/21 and husband 09/16/2021. Husband and patient 09/17/21 at bedside - DNR/DNI recommended/   Dispo: Can still go to progressive unit.  Discussed with hospitalist.  CCM will see her 1-2 times a week.  Might need pulmonary consult at follow-up but I suspect she is hospice eligible but this is depending on goals of care. Appreciate pall cae consult   ATTESTATION & SIGNATURE     Dr. Brand Males, M.D., [facility].C.P Pulmonary and Critical Care Medicine Staff Physician [facility] Pager: [phone], If no answer or  between  15:00h - 7:00h: call [phone]  09/17/2021 9:27 AM    LABS       PULMONARY Recent Labs  Lab 09/13/2021 1201 09/07/2021 1745 09/15/21 1225 09/15/21 1700  PHART  --  7.444 7.227* 7.348*  PCO2ART  --  54.2* 90.6* 67.9*  PO2ART  --  35.2* 97.5 51.8*  HCO3  --  36.6* 36.3* 36.4*  TCO2 37*  --   --   --   O2SAT  --  66.2 97.3 85.8    CBC Recent Labs  Lab 09/15/21 0255 09/16/21 0259 09/17/21 0420  HGB 7.8* 7.4* 7.8*  HCT 26.4* 25.2* 27.6*  WBC 2.9* 2.4* 2.7*  PLT 137* 138* 131*    COAGULATION Recent Labs  Lab 09/16/2021 1155 09/12/21 0248  INR 1.1 1.1    CARDIAC  No results for input(s): TROPONINI in the last 168 hours. No results for input(s): PROBNP in the last 168 hours.   CHEMISTRY Recent Labs  Lab 09/14/21 0004 09/14/21 2141 09/15/21 0255 09/15/21 1443 09/16/21 0259 09/17/21 0420  NA 139 135 136 134* 134* 132*  K 3.3* 3.0* 3.0* 3.3* 3.1* 3.7  CL 97* 94* 93* 92* 91* 89*  CO2 34* 30 33* 36* 37* 35*  GLUCOSE 112* 109* 118* 125* 116* 105*  BUN _0 7* 6*  CREATININE 0.74 0.57 0.62 0.55 0.55 0.51  CALCIUM 7.4* 7.9* 7.8* 8.2* 8.2* 8.4*  MG 1.2* 1.9 1.7  --  1.9 1.9  PHOS  --   --  2.8  --   --  3.0   Estimated Creatinine Clearance: 75.2 mL/min (by C-G formula based on SCr of 0.51 mg/dL).   LIVER Recent Labs  Lab  08/31/2021 1155 09/12/21 0248 09/14/21 0004 09/17/21 0420  AST 17 14* 14* 10*  ALT _1 ALKPHOS 73 72 62 71  BILITOT 0.6 0.6 0.5 0.4  PROT 7.1 6.2* 5.5* 6.1*  ALBUMIN 2.7* 2.4* 2.2* 2.1*  INR 1.1 1.1  --   --      INFECTIOUS Recent Labs  Lab 09/23/2021 1155 09/12/21 0646  LATICACIDVEN 1.7  --   PROCALCITON  --  0.40     ENDOCRINE CBG (last 3)  No results for input(s): GLUCAP in the last 72 hours.       IMAGING x48h  - image(s) personally visualized  -   highlighted in bold DG CHEST PORT 1 VIEW  Result Date: 09/15/2021 CLINICAL DATA:  Dyspnea and respiratory abnormality EXAM: PORTABLE CHEST 1 VIEW COMPARISON:  CT chest angiography 09/10/2021 FINDINGS: The heart and mediastinal contours are within normal limits. Bibasilar patchy airspace opacities. Left upper lobe airspace opacity. Increased and coarsened interstitial markings. No pulmonary edema. Trace right pleural effusion. Possible trace left pleural effusion. No pneumothorax. No acute osseous abnormality. Degenerative changes of bilateral shoulders. IMPRESSION: 1. Interval development of bibasilar patchy airspace opacities which may represent a combination of infection/inflammation and/or atelectasis. 2. Trace right pleural effusion. Possible trace left pleural effusion. 3. Left upper lobe airspace opacity consistent with known primary bronchogenic carcinoma better evaluated on CT angiography chest 09/21/2021. 4.  Emphysema (ICD10-J43.9). Electronically Signed   By: Iven Finn M.D.   On: 09/15/2021 15:08

## 2021-09-17 NOTE — Progress Notes (Signed)
RT was called again for pt requesting Neb tx. PRN neb given. Asked pt again about bipap but she refused again. Pt is resting at this time. No resp distress noted.

## 2021-09-18 ENCOUNTER — Ambulatory Visit: Payer: 59

## 2021-09-18 DIAGNOSIS — J9611 Chronic respiratory failure with hypoxia: Secondary | ICD-10-CM | POA: Diagnosis not present

## 2021-09-18 DIAGNOSIS — Z7189 Other specified counseling: Secondary | ICD-10-CM | POA: Diagnosis not present

## 2021-09-18 DIAGNOSIS — R0689 Other abnormalities of breathing: Secondary | ICD-10-CM

## 2021-09-18 DIAGNOSIS — J449 Chronic obstructive pulmonary disease, unspecified: Secondary | ICD-10-CM | POA: Diagnosis not present

## 2021-09-18 DIAGNOSIS — I480 Paroxysmal atrial fibrillation: Secondary | ICD-10-CM | POA: Diagnosis not present

## 2021-09-18 DIAGNOSIS — R0600 Dyspnea, unspecified: Secondary | ICD-10-CM

## 2021-09-18 DIAGNOSIS — R918 Other nonspecific abnormal finding of lung field: Secondary | ICD-10-CM

## 2021-09-18 DIAGNOSIS — C3492 Malignant neoplasm of unspecified part of left bronchus or lung: Secondary | ICD-10-CM | POA: Diagnosis not present

## 2021-09-18 DIAGNOSIS — R6510 Systemic inflammatory response syndrome (SIRS) of non-infectious origin without acute organ dysfunction: Secondary | ICD-10-CM | POA: Diagnosis not present

## 2021-09-18 LAB — COMPREHENSIVE METABOLIC PANEL
ALT: 7 U/L (ref 0–44)
AST: 11 U/L — ABNORMAL LOW (ref 15–41)
Albumin: 2 g/dL — ABNORMAL LOW (ref 3.5–5.0)
Alkaline Phosphatase: 68 U/L (ref 38–126)
Anion gap: 6 (ref 5–15)
BUN: <5 mg/dL — ABNORMAL LOW (ref 8–23)
CO2: 40 mmol/L — ABNORMAL HIGH (ref 22–32)
Calcium: 8.2 mg/dL — ABNORMAL LOW (ref 8.9–10.3)
Chloride: 88 mmol/L — ABNORMAL LOW (ref 98–111)
Creatinine, Ser: 0.55 mg/dL (ref 0.44–1.00)
GFR, Estimated: >60 mL/min (ref >60)
Glucose, Bld: 91 mg/dL (ref 70–99)
Potassium: 3.3 mmol/L — ABNORMAL LOW (ref 3.5–5.1)
Sodium: 134 mmol/L — ABNORMAL LOW (ref 135–145)
Total Bilirubin: 0.3 mg/dL (ref 0.3–1.2)
Total Protein: 5.8 g/dL — ABNORMAL LOW (ref 6.5–8.1)

## 2021-09-18 LAB — CBC
HCT: 25.1 % — ABNORMAL LOW (ref 36.0–46.0)
Hemoglobin: 7.2 g/dL — ABNORMAL LOW (ref 12.0–15.0)
MCH: 25 pg — ABNORMAL LOW (ref 26.0–34.0)
MCHC: 28.7 g/dL — ABNORMAL LOW (ref 30.0–36.0)
MCV: 87.2 fL (ref 80.0–100.0)
Platelets: 159 10*3/uL (ref 150–400)
RBC: 2.88 MIL/uL — ABNORMAL LOW (ref 3.87–5.11)
RDW: 18.8 % — ABNORMAL HIGH (ref 11.5–15.5)
WBC: 2.5 10*3/uL — ABNORMAL LOW (ref 4.0–10.5)
nRBC: 0.8 % — ABNORMAL HIGH (ref 0.0–0.2)

## 2021-09-18 LAB — MAGNESIUM: Magnesium: 2 mg/dL (ref 1.7–2.4)

## 2021-09-18 LAB — HEPARIN LEVEL (UNFRACTIONATED): Heparin Unfractionated: 0.38 IU/mL (ref 0.30–0.70)

## 2021-09-18 LAB — BRAIN NATRIURETIC PEPTIDE: B Natriuretic Peptide: 80.3 pg/mL (ref 0.0–100.0)

## 2021-09-18 LAB — PHOSPHORUS: Phosphorus: 2.5 mg/dL (ref 2.5–4.6)

## 2021-09-18 MED ORDER — FENTANYL CITRATE (PF) 100 MCG/2ML IJ SOLN
25.0000 ug | INTRAMUSCULAR | Status: DC | PRN
  Administered 2021-09-18 – 2021-10-04 (×11): 25 ug via INTRAVENOUS
  Filled 2021-09-18 (×11): qty 2

## 2021-09-18 MED ORDER — IPRATROPIUM-ALBUTEROL 0.5-2.5 (3) MG/3ML IN SOLN
3.0000 mL | RESPIRATORY_TRACT | Status: DC
  Administered 2021-09-18 – 2021-09-20 (×10): 3 mL via RESPIRATORY_TRACT
  Filled 2021-09-18 (×10): qty 3

## 2021-09-18 MED ORDER — LORAZEPAM 2 MG/ML IJ SOLN
0.5000 mg | INTRAMUSCULAR | Status: DC | PRN
  Administered 2021-09-18 – 2021-09-30 (×21): 0.5 mg via INTRAVENOUS
  Filled 2021-09-18 (×23): qty 1

## 2021-09-18 MED ORDER — POTASSIUM CHLORIDE 10 MEQ/50ML IV SOLN
10.0000 meq | INTRAVENOUS | Status: AC
  Administered 2021-09-18 (×6): 10 meq via INTRAVENOUS
  Filled 2021-09-18 (×6): qty 50

## 2021-09-18 MED ORDER — ENSURE ENLIVE PO LIQD
237.0000 mL | Freq: Three times a day (TID) | ORAL | Status: DC
  Administered 2021-09-19 – 2021-09-28 (×20): 237 mL via ORAL

## 2021-09-18 MED ORDER — FUROSEMIDE 10 MG/ML IJ SOLN
60.0000 mg | Freq: Once | INTRAMUSCULAR | Status: AC
  Administered 2021-09-18: 60 mg via INTRAVENOUS
  Filled 2021-09-18: qty 6

## 2021-09-18 MED ORDER — LORAZEPAM 2 MG/ML IJ SOLN
INTRAMUSCULAR | Status: AC
  Filled 2021-09-18: qty 1

## 2021-09-18 MED ORDER — FENTANYL CITRATE (PF) 100 MCG/2ML IJ SOLN
INTRAMUSCULAR | Status: AC
  Administered 2021-09-18: 25 ug via INTRAVENOUS
  Filled 2021-09-18: qty 2

## 2021-09-18 MED ORDER — LORAZEPAM 2 MG/ML IJ SOLN
1.0000 mg | Freq: Once | INTRAMUSCULAR | Status: AC
  Administered 2021-09-18: 0.5 mg via INTRAVENOUS

## 2021-09-18 MED ORDER — METHYLPREDNISOLONE SODIUM SUCC 40 MG IJ SOLR
40.0000 mg | INTRAMUSCULAR | Status: DC
  Administered 2021-09-18 – 2021-09-19 (×2): 40 mg via INTRAVENOUS
  Filled 2021-09-18 (×2): qty 1

## 2021-09-18 MED FILL — Dexamethasone Sodium Phosphate Inj 100 MG/10ML: INTRAMUSCULAR | Qty: 1 | Status: AC

## 2021-09-18 NOTE — Progress Notes (Addendum)
Progress Note  Patient Name: Patricia Sampson Date of Encounter: 09/18/2021  [facility] Cardiologist: Mertie Moores, MD   Subjective   Had increased air hunger and SOB this AM, was treated with Ativan and Fentanyl per PCCM. Now more comfortable and sleeping. Sister at bedside.  Inpatient Medications    Scheduled Meds:  chlorhexidine  15 mL Mouth Rinse BID   Chlorhexidine Gluconate Cloth  6 each Topical Daily   ipratropium-albuterol  3 mL Nebulization TID   mouth rinse  15 mL Mouth Rinse q12n4p   sodium chloride flush  10-40 mL Intracatheter Q12H   vitamin B-12  1,000 mcg Oral Daily   Continuous Infusions:  sodium chloride 250 mL (09/16/21 1617)   sodium chloride Stopped (09/18/21 0523)   acyclovir (ZOVIRAX) [phone] mg IVPB 745 mg (09/18/21 1324)   amiodarone 30 mg/hr (09/18/21 1005)   heparin 1,400 Units/hr (09/18/21 1005)   PRN Meds: sodium chloride, acetaminophen **OR** acetaminophen, fentaNYL (SUBLIMAZE) injection, ipratropium-albuterol, LORazepam, ondansetron **OR** ondansetron (ZOFRAN) IV, sodium chloride flush   Vital Signs    Vitals:   09/18/21 0700 09/18/21 0820 09/18/21 0900 09/18/21 1000  BP: (!) 112/55  124/62 (!) 103/55  Pulse: (!) 102  (!) 107 100  Resp: 20  (!) 33 (!) 22  Temp:  97.7 F (36.5 C)    TempSrc:  Oral    SpO2: 97% 99% (!) 86% 92%  Weight:      Height:        Intake/Output Summary (Last 24 hours) at 09/18/2021 1342 Last data filed at 09/18/2021 1005 Gross per 24 hour  Intake 3197.06 ml  Output 1100 ml  Net 2097.06 ml   Last 3 Weights 09/18/2021 09/16/2021 09/16/2021  Weight (lbs) 188 lb 4.4 oz 165 lb 5.5 oz 165 lb 5.5 oz  Weight (kg) 85.4 kg 75 kg 75 kg      Telemetry    NSR rare PAC - Personally Reviewed  Physical Exam   GEN: No acute distress.   Neck: No JVD Cardiac: RRR, no murmurs, rubs, or gallops.  Respiratory: Decreased BS bilaterally. No wheezing, rales or rhonchi. GI: Soft, nontender, non-distended  MS: No  edema; No deformity. Neuro:  Sleeping Psych: Not agitated   Labs    High Sensitivity Troponin:  No results for input(s): TROPONINIHS in the last 720 hours.   Chemistry Recent Labs  Lab 09/14/21 0004 09/14/21 2141 09/16/21 0259 09/17/21 0420 09/18/21 0228  NA 139   < > 134* 132* 134*  K 3.3*   < > 3.1* 3.7 3.3*  CL 97*   < > 91* 89* 88*  CO2 34*   < > 37* 35* 40*  GLUCOSE 112*   < > 116* 105* 91  BUN 14   < > 7* 6* <5*  CREATININE 0.74   < > 0.55 0.51 0.55  CALCIUM 7.4*   < > 8.2* 8.4* 8.2*  MG 1.2*   < > 1.9 1.9 2.0  PROT 5.5*  --   --  6.1* 5.8*  ALBUMIN 2.2*  --   --  2.1* 2.0*  AST 14*  --   --  10* 11*  ALT 9  --   --  8 7  ALKPHOS 62  --   --  71 68  BILITOT 0.5  --   --  0.4 0.3  GFRNONAA >60   < > >60 >60 >60  ANIONGAP 8   < > _0 < > = values  in this interval not displayed.    Lipids No results for input(s): CHOL, TRIG, HDL, LABVLDL, LDLCALC, CHOLHDL in the last 168 hours.  Hematology Recent Labs  Lab 09/16/21 0259 09/17/21 0420 09/18/21 0228  WBC 2.4* 2.7* 2.5*  RBC 2.88* 3.12* 2.88*  HGB 7.4* 7.8* 7.2*  HCT 25.2* 27.6* 25.1*  MCV 87.5 88.5 87.2  MCH 25.7* 25.0* 25.0*  MCHC 29.4* 28.3* 28.7*  RDW 18.6* 18.6* 18.8*  PLT 138* 131* 159   Thyroid  Recent Labs  Lab 09/12/21 0855  TSH 2.466    BNP Recent Labs  Lab 09/18/21 0228  BNP 80.3    DDimer No results for input(s): DDIMER in the last 168 hours.   Radiology    DG CHEST PORT 1 VIEW  Result Date: 09/17/2021 CLINICAL DATA:  Stage IIIA adenocarcinoma with large left upper lobe mass and chest wall invasion, undergoing chemotherapy. EXAM: PORTABLE CHEST 1 VIEW COMPARISON:  Radiograph 09/15/2021; CT chest 09/23/2021 FINDINGS: Right upper extremity PICC tip projects at the level of the upper SVC. Nodular opacity overlying the left upper lobe, consistent with left lung mass seen on CT 09/07/2021. Nodular opacity at the left hilum corresponds to the enlarged node seen on cross-sectional  imaging. Diffuse interstitial thickening, similar to 09/15/2021. Small bilateral pleural effusions and increasing patchy airspace opacities in the lung bases. No pneumothorax. Heart size is difficult to assess due to adjacent consolidations but appears stable. IMPRESSION: 1. Right upper extremity PICC tip projects at the level of the upper SVC. 2. Increasing bilateral small pleural effusions and bibasilar airspace opacities, favored to represent atelectasis, though infection is not excluded. 3. Diffuse hazy interstitial thickening is favored to reflect pulmonary edema but could also represent lymphangitic carcinomatosis. 4. No significant change in the nodular opacity in the left upper lung, corresponding to the left upper lobe mass better appreciated on prior cross-sectional imaging. Electronically Signed   By: Ileana Roup M.D.   On: 09/17/2021 11:09    Cardiac Studies   2D echo 08/2021   1. Left ventricular ejection fraction, by estimation, is 55 to 60%. The  left ventricle has normal function. The left ventricle has no regional  wall motion abnormalities. Left ventricular diastolic parameters are  consistent with Grade I diastolic  dysfunction (impaired relaxation).   2. There is mild respirophasic variation of interventricular septum  position, possibly due to the patient's underlying lung disease. Right  ventricular systolic function is normal. The right ventricular size is  normal.   3. The mitral valve is normal in structure. No evidence of mitral valve  regurgitation. No evidence of mitral stenosis.   4. The aortic valve is tricuspid. Aortic valve regurgitation is not  visualized. No aortic stenosis is present.   5. The inferior vena cava is dilated in size with <50% respiratory  variability, suggesting right atrial pressure of 15 mmHg.   Patient Profile     65 y.o. female with severe COPD with chronic hypoxic respiratory failure on 4-8L, HTN, HLD, remote tobacco abuse, lung mass  04/2021 at OSH later confirmed as lung cancer by pathology 06/2021 undergoing chemoradiation admitted with AMS, concern for sepsis, meningitis, and worsening respiratory failure. Imaging shows progressive bronchogenic carcinoma with increased destruction of anterior left second rib, new perilymphatic nodularity in the left upper lobe adjacent to the mass, consistent with lymphangitic carcinomatosis, and Unchanged and left suprahilar nodal metastatic disease. Requiring BiPAP for oxygenation; palliative care consultation planned for goals of care. Cardiology consulted for atrial fibrillation, converted  on IV amiodarone.   Assessment & Plan    1. Paroxysmal atrial fibrillation - new diagnosis this admission, occurred in setting of multiple stressors - 2D echo with normal LVEF, mild mild respirophasic variation of interventricular septum position, possibly due to the patient's underlying lung disease, dilated IVC - converted to NSR on IV amiodarone - has not converted to oral form due to BiPAP and recent dysphagia - will discuss continued plans with MD for continuation vs discontinuation with observation  - remains on heparin drip at this time, not appropriate to convert to oral form given clinical status - TSH wnl  2. Acute on chronic respiratory failure/severe COPD/progression of lung cancer - followed by pulmonary, has been on BiPAP, has been borderline for needing intubation this admission but palliative consulted for [place] - pulmonary has recommended to give ativan/fentanyl this AM for air hunger - per IM/PCCM  3. Essential HTN - tends to be hypotensive or low-normal this admission  Remainder of medical problems per primary teams.  For questions or updates, please contact [place] Please consult [email or web address] for contact info under        Signed, Sanda Klein, MD  09/18/2021, 1:42 PM     I have seen and examined the patient along with Charlie Pitter, PA-C .  I have reviewed the  chart, notes and new data.  I agree with PA/NP's note.  Key new complaints: sleepy, no CV complaints Key examination changes: regular rhythm Key new findings / data: sinus tachycardia on monitor and ECG, rare PVCs  PLAN: Continue IV amiodarone until able to take PO.  Sanda Klein, MD, [place] [phone] 09/18/2021, 1:42 PM

## 2021-09-18 NOTE — Progress Notes (Signed)
Daily Progress Note   Patient Name: Patricia Sampson       Date: 09/18/2021 DOB: July 03, 1956  Age: 65 y.o. MRN#: [id number] Attending Physician: Flora Lipps, MD Primary Care Physician: Greig Right, MD Admit Date: 09/15/2021  Reason for Consultation/Follow-up: Establishing goals of care  Subjective: I saw and examined Patricia Sampson and met with her son, Chello, and patient's sister at the bedside.  Patricia Sampson was once again on BiPAP but sleepy and did not participate in conversation.  She was tolerating BiPAP well.  Discussed with Chelo regarding clinical course overnight and he understands continued concern about her breathing in relation to COPD and underlying lung cancer.  Family understands that this is multifactorial and not just a cancer issue, however, her cancer plays a large part in their decision making about goals and they really feel that they need to have input from radiation oncology prior to having a meeting to further discuss further goals of care.  He affirmed that family understands importance of compliance with BiPAP and that whoever is staying with her will work to ensure that she wears it when needed.  Length of Stay: 7  Current Medications: Scheduled Meds:  . chlorhexidine  15 mL Mouth Rinse BID  . Chlorhexidine Gluconate Cloth  6 each Topical Daily  . feeding supplement  237 mL Oral TID BM  . ipratropium-albuterol  3 mL Nebulization Q4H  . mouth rinse  15 mL Mouth Rinse q12n4p  . methylPREDNISolone (SOLU-MEDROL) injection  40 mg Intravenous Q24H  . sodium chloride flush  10-40 mL Intracatheter Q12H  . vitamin B-12  1,000 mcg Oral Daily    Continuous Infusions: . sodium chloride 250 mL (09/16/21 1617)  . sodium chloride Stopped (09/18/21 0523)  .  acyclovir (ZOVIRAX) [phone] mg IVPB Stopped (09/18/21 1424)  . amiodarone 30 mg/hr (09/18/21 1500)  . heparin 1,400 Units/hr (09/18/21 1500)    PRN Meds: sodium chloride, acetaminophen **OR** acetaminophen, fentaNYL (SUBLIMAZE) injection, ipratropium-albuterol, LORazepam, ondansetron **OR** ondansetron (ZOFRAN) IV, sodium chloride flush  Physical Exam         General: Sleepy but arousable  HEENT: On BiPAP  heart: Regular rate and rhythm. No murmur appreciated. Lungs: Diminished air movement, clear Abdomen: Soft, nontender, nondistended, positive bowel sounds.   Ext: No significant edema Skin: Warm and dry  Vital Signs:  BP 134/69   Pulse (!) 110   Temp 97.7 F (36.5 C) (Axillary)   Resp (!) 22   Ht _0  (1.753 m)   Wt 85.4 kg   SpO2 95%   BMI 27.80 kg/m  SpO2: SpO2: 95 % O2 Device: O2 Device: Bi-PAP O2 Flow Rate: O2 Flow Rate (L/min): 8 L/min  Intake/output summary:  Intake/Output Summary (Last 24 hours) at 09/18/2021 1618 Last data filed at 09/18/2021 1500 Gross per 24 hour  Intake 2174.03 ml  Output 625 ml  Net 1549.03 ml    LBM: Last BM Date: 09/18/21 Baseline Weight: Weight: 75 kg Most recent weight: Weight: 85.4 kg       Palliative Assessment/Data:    Flowsheet Rows    Flowsheet Row Most Recent Value  Intake Tab   Referral Department Hospitalist  Unit at Time of Referral ICU  Palliative Care Primary Diagnosis Sepsis/Infectious Disease  Date Notified 09/15/21  Palliative Care Type New Palliative care  Reason for referral Clarify Goals of Care  Date of Admission 09/24/2021  Date first seen by Palliative Care 09/16/21  # of days Palliative referral response time 1 Day(s)  # of days IP prior to Palliative referral 4  Clinical Assessment   Palliative Performance Scale Score 40%  Psychosocial & Spiritual Assessment   Palliative Care Outcomes        Patient Active Problem List   Diagnosis Date Noted  . Acute on chronic respiratory failure with  hypoxia and hypercapnia (HCC)   . PAF (paroxysmal atrial fibrillation) ([place])   . Altered mental status   . SIRS (systemic inflammatory response syndrome) ([place]) 09/23/2021  . Microcytic anemia 08/21/2021  . Adenocarcinoma of left lung, stage 3 ([place]) 07/19/2021  . Encounter for antineoplastic chemotherapy 07/19/2021  . Cancer associated pain 07/19/2021  . Pneumonia due to COVID-19 virus 11/25/2019  . Shortness of breath 11/18/2019  . Chronic respiratory failure with hypoxia ([place]) 11/18/2019  . COPD (chronic obstructive pulmonary disease) ([place]) 01/27/2014  . Hypoxemia 01/27/2014  . Secondary pulmonary arterial hypertension ([place]) 01/27/2014    Palliative Care Assessment & Plan   Patient Profile: 65 y.o. female  with past medical history of severe COPD, chronic hypoxic respiratory failure on 4-8 L, hypertension, hyperlipidemia with recent diagnosis of stage III adenocarcinoma with large left upper lobe mass and chest wall invasion in August of this year who is currently undergoing chemoradiation admitted on 08/31/2021 with altered mental status with concern for sepsis.  Her mental status over the past several days has remained poor and far from her baseline.  Palliative consulted for goals of care.  Recommendations/Plan: Full code/full scope I met again today with patient's son and her sister.  Family still feels that they need to have input from radiation oncology prior to being able to have a meeting to discuss long-term goals of care. Yesterday, family reported being very surprised that she would have disease progression as he thought that she was doing well with treatment.  He feels that the question of where things stand with cancer will potentially drive other decision making and family cannot make any other decisions until he understand better what is going on from a cancer perspective. Secure chat sent to radiation oncology to see if they have any input on changes on most recent imaging  and how this would impact thoughts on her response to treatment and potential prognosis. I will ask another member of the palliative medicine team to follow-up again tomorrow.  Goals of Care  and Additional Recommendations: Limitations on Scope of Treatment: Full Scope Treatment  Code Status:    Code Status Orders  (From admission, onward)           Start     Ordered   09/14/2021 2128  Full code  Continuous        09/12/2021 2127           Code Status History     This patient has a current code status but no historical code status.       Prognosis:  Unable to determine  Discharge Planning: To Be Determined  Care plan was discussed with patient's son  Thank you for allowing the Palliative Medicine Team to assist in the care of this patient.   Total Time 30 Prolonged Time Billed No   Greater than 50%  of this time was spent counseling and coordinating care related to the above assessment and plan.   Micheline Rough, MD  Please contact Palliative Medicine Team phone at [phone] for questions and concerns.

## 2021-09-18 NOTE — Progress Notes (Signed)
ANTICOAGULATION CONSULT NOTE - follow up  Pharmacy Consult for heparin Indication: atrial fibrillation  Allergies  Allergen Reactions   Dyazide [Hydrochlorothiazide W-Triamterene]     hives   Zestoretic [Lisinopril-Hydrochlorothiazide]     Swelling     Patient Measurements: Height: _0  (175.3 cm) Weight: 85.4 kg (188 lb 4.4 oz) IBW/kg (Calculated) : 66.2 Heparin Dosing Weight: 74.6 kg  Vital Signs: Temp: 98 F (36.7 C) (10/24 0332) Temp Source: Oral (10/24 0332) BP: 112/55 (10/24 0700) Pulse Rate: 102 (10/24 0700)  Labs: Recent Labs    09/16/21 0259 09/17/21 0420 09/18/21 0228  HGB 7.4* 7.8* 7.2*  HCT 25.2* 27.6* 25.1*  PLT 138* 131* 159  HEPARINUNFRC 0.63 0.55 0.38  CREATININE 0.55 0.51 0.55     Estimated Creatinine Clearance: 84 mL/min (by C-G formula based on SCr of 0.55 mg/dL).    Medications: Infusions:   sodium chloride 250 mL (09/16/21 1617)   sodium chloride Stopped (09/18/21 0523)   acyclovir (ZOVIRAX) [phone] mg IVPB Stopped (09/18/21 7471)   amiodarone 30 mg/hr (09/18/21 0618)   cefTRIAXone (ROCEPHIN)  IV Stopped (09/17/21 2218)   heparin 1,400 Units/hr (09/18/21 0618)   potassium chloride 10 mEq (09/18/21 8550)    Assessment: 65 yo female with new atrial fibrillation/flutter.  No history of anticoagulation PTA and none received this admit.  Patient case discussed with cardiology who recommended starting patient on heparin IV.  Baseline aPTT wnl at 32 seconds and INR 1.1.  Patient is pancytopenic likely due to chemo/radiation therapy for lung adenocarcinoma.  Pharmacy consulted to dose heparin.    Today, 09/18/21 Heparin level 0.38, therapeutic on 1400 units/hr -Hgb low/decreased to 7.2, Plt increased to 159k -No s/sx bleeding reported  Goal of Therapy:  Heparin level 0.3-0.7 units/ml Monitor platelets by anticoagulation protocol: Yes   Plan:  Continue heparin infusion at 1400 untis/hr Daily HL/CBC Monitor for s/sx bleeding F/u  long-term plans, ability to transition to oral anticoagulant   Gretta Arab PharmD, BCPS Clinical Pharmacist [facility] main pharmacy [phone] 09/18/2021 7:34 AM

## 2021-09-18 NOTE — Progress Notes (Signed)
Pt AM K+ 3.3 with creat 0.55 and GFR >60. CCM ELink electrolyte protocol initiated.

## 2021-09-18 NOTE — Progress Notes (Signed)
Transporter arrived to take patient to radiation treatment. Pt still very anxious despite earlier dose of ativan. Pt having difficulty with oxygenation. Saturations dropped down to 79 with increased WOB. Placed back on BIPAP, radiation treatment delayed.

## 2021-09-18 NOTE — Progress Notes (Signed)
Palliative care brief note  Plan for family meeting this afternoon at 25.  Patient son, daughter, and husband to attend.  Micheline Rough, MD [facility] Team [phone]

## 2021-09-18 NOTE — Progress Notes (Addendum)
NAME:  Patricia Sampson, MRN:  [id number], DOB:  05-12-1956, LOS: 7 ADMISSION DATE:  09/09/2021, CONSULTATION DATE: 09/03/2021 REFERRING MD: Dr. Marylyn Ishihara, CHIEF COMPLAINT: Encephalopathy  BRIEF  65 year old woman with very severe COPD, associated chronic hypoxemic respiratory failure (4-8 L/min), hypertension, hyperlipidemia.  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 She was noted to have altered mental status on the morning of 10/17, appeared to be normal when she went to bed the night before.  In the emergency department she was tachycardic, tachypneic with pancytopenia.  Last chemotherapy infusion appears to have been 09/05/2021.  Acute metabolic encephalopathy.  She was treated empirically for possible sepsis, source unknown. Home med list with Percocet, Neurontin.  Her husband notes that he does not believe she is been taking any of her medicines for the last 2 to 3 days.   Pertinent  Medical History   Past Medical History:  Diagnosis Date   Angioedema    COPD (chronic obstructive pulmonary disease) (HCC)    FHx: migraine headaches    Hyperlipidemia    Hypertension    Seasonal allergies     Significant Hospital Events: Including procedures, antibiotic start and stop dates in addition to other pertinent events   CT abdomen pelvis 10/17 >> no acute findings Head CT 10/17 >> normal CT-PA chest 10/17 >> no new infiltrates or significant change in the left upper lobe mass.  No PE MRI brain 10/17 >> Motion degradation compromise interpretation, no acute or subacute infarct, no mass lesion  Progressive left upper lobe primary bronchogenic carcinoma with increased destruction of the anterior left second rib. New perilymphatic nodularity in the left upper lobe adjacent to the mass, consistent with lymphangitic carcinomatosis. 3. 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  EEG 10/18 with  generalized slowing, no evidence of active seizures or epileptiform activity 10/18 acyclovir and ampicillin added to cover possible meningitis 10/19 SVT, likely A. fib/flutter noted after adenosine given.  Hypotension with diltiazem, then started amiodarone.  Back in sinus tachycardia 10/20 - SVT with what looks like A. fib/flutter after adenosine given early evening 10/19.  No real response to diltiazem infusion, then given amiodarone.  Back in sinus tachycardia, . \atient continued to be aphasic and to have agitation through the day 10/19.  Now off Precedex. I/O positive 8.7 L total. Has had hypokalemia, replaced. WBC increasing, 2.9 this morning 10/20  10/21 -husband and son at the bedside.  They tell me that after she had COVID in 2020 she has been on 6 L oxygen.  For the last 3 months before admissions she has had 35 pound weight loss approximately.  August 2022 was diagnosed with cancers in September 2022 was on chemoradiation.  Since chemoradiation there is more failure to thrive with progressive decrease in activity particularly after chemoradiation cycles. Currently on 10 L nasal cannula pulse ox 95%.  Has not been on BiPAP.  Has not been on the ventilator.  Is not on pressors.  Not on Precedex  10/22 -had severe worsening of respiratory acidosis acute on chronic with a pH of 7.22 and a PCO2 of 90 associated with worsening bilateral lower lobe atelectasis [has left upper lobe cancer] on chest x-ray compared to 09/25/2021.  Treated with BiPAP.  Blood gas subsequent improvement but did not tolerate BiPAP.  Nonetheless mental status today better.  Husband also acknowledges that she is looking better.  Cachectic elderly female.  Significant physical deconditioning .  currently on 6 L nasal cannula pulse ox 95%. Continues on IV heparin infusion and amio infusion.  On acyclovir and ampicillin and ceftriaxone for potential meningitis.  Afebrile since 09/13/2021 but leukopenic with a white count of 2.4K.   Culture negative so far Pall care consult - full code + but family meeting being arranged   Interim History / Subjective:  Today she is more agitated on BiPAP. Off bipap she is SOB and feels like it's hard to catch her breath and generally feels poorly. She complains of pain all over. Her sister is at bedside today and feels that she looks uncomfortable.  Objective   Blood pressure (!) 103/55, pulse 100, temperature 97.7 F (36.5 C), temperature source Oral, resp. rate (!) 22, height _0  (1.753 m), weight 85.4 kg, SpO2 92 %.    FiO2 (%):  [55 %] 55 % Estimated body mass index is 27.8 kg/m as calculated from the following:   Height as of this encounter: _1  (1.753 m).   Weight as of this encounter: 85.4 kg.   Intake/Output Summary (Last 24 hours) at 09/18/2021 1156 Last data filed at 09/18/2021 1005 Gross per 24 hour  Intake 3197.06 ml  Output 1100 ml  Net 2097.06 ml    Filed Weights   09/16/21 1200 09/16/21 1600 09/18/21 0500  Weight: 75 kg 75 kg 85.4 kg    Examination: General Appearance:  cachectic elderly woman in moderate respiratory distress complaining of pain Head:  [place]/AT, eyes anicteric  Neck: supple Lungs: rhales on the left, no wheezing, using some accessory muscles Heart:  S1S2, RRR, no murmurs Abdomen:  soft, NT, ND Extremities:  no LE edema, no cyanosis, +clubbing Skin: warm, dry Neurologic:  awake and alert, answering some questions but giving short answers, tracks, moving all extremities but agitated and squirming in her bed  Na+ 143 K+ 2.2 AST 11 WBC 2.5 H/H 7.2/25.1   Resolved Hospital Problem list     Assessment & Plan:   Acute on chronic respiratory failure with hypoxia and hypercapnia-due to acute COPD exacerbation and progressive lung adenocarcinoma.  She has very widespread cancer in the left lung -Given Ativan for air hunger this morning - Continue bronchodilators-increased every 4 hours -Resume Solu-Medrol -lasix today -BiPAP as  needed -agree with palliative care meeting; do not anticipate that progressing to intubation would improve her chances of surviving this illness  Acute encephalopathy with agitated delirium.   -Patient's family has refused LP, but she has been on empiric treatment for meningitis and encephalitis - Family visitation, DNI reorientation, supportive care - Avoid deliria genic meds were possible.  This is very tough with her shortness of breath and anxiety. - Continue antibiotics for now -Continue B12  Acute on chronic pain -fentanyl PRN today  SVT, paroxysmal AF -Continue amiodarone and heparin - Appreciate cardiology's input - Telemetry  Adenocarcinoma of the lung, on chemoradiation- Stage IIIA (T4, N1, M0) non-small cell lung cancer, adenocarcinoma presented with large left upper lobe lung mass with chest wall invasion in addition to left suprahilar lymphadenopathy diagnosed in August 2022.  Despite treatment she has had progression with rib destruction  from bony metastesis  -Palliative care consultation; family meeting today  Chronic anemia  Acute pancytopenia, resolved Acute leukopenia -Monitor -Transfuse for hemoglobin less than 7 or hemodynamically significant bleeding  Hyponatremia -Monitor  Hypokalemia - Repleted - Monitor  Mild hypomagnesmia, resolved  Failure to thrive with > 30# weight  loss PTA Cancer pain on chronic opioid Severe Protein  calorie malnutrition  -Palliative care consult - Adding Ensure 3 times daily   Mrs. Antonucci sister was updated at bedside.  She is very acutely uncomfortable.  She has chronic illness that is unlikely to improve.  Anticipate she will die in the hospital this admission, likely in the coming days.  Plan for goals of care meeting this afternoon.  Unfortunately she is high risk for decompensation at any time.   Best Practice (right click and "Reselect all SmartList Selections" daily)   Diet/type: Regular consistency (see  orders) DVT prophylaxis: other GI prophylaxis: N/A Lines: N/A Foley:  N/A Code Status:  full code Last date of multidisciplinary goals of care discussion [10/17. Discussed GOC briefly with pt's husband 10/17 -- need further discussions and would like to include pt, her son. She is full code at this time.]  Family: Updated son at bedside on 10/20 and 09/15/21 and husband 09/16/2021. Husband and patient 09/17/21 at bedside - DNR/DNI recommended/   Dispo: Can still go to progressive unit.  Discussed with hospitalist.  CCM will see her 1-2 times a week.  Might need pulmonary consult at follow-up but I suspect she is hospice eligible but this is depending on goals of care. Appreciate pall cae consult   LABS       PULMONARY Recent Labs  Lab 09/23/2021 1201 08/27/2021 1745 09/15/21 1225 09/15/21 1700 09/17/21 1000  PHART  --  7.444 7.227* 7.348* 7.315*  PCO2ART  --  54.2* 90.6* 67.9* 81.5*  PO2ART  --  35.2* 97.5 51.8* 56.1*  HCO3  --  36.6* 36.3* 36.4* 40.3*  TCO2 37*  --   --   --   --   O2SAT  --  66.2 97.3 85.8 87.8     CBC Recent Labs  Lab 09/16/21 0259 09/17/21 0420 09/18/21 0228  HGB 7.4* 7.8* 7.2*  HCT 25.2* 27.6* 25.1*  WBC 2.4* 2.7* 2.5*  PLT 138* 131* 159     COAGULATION Recent Labs  Lab 09/12/21 0248  INR 1.1     CARDIAC  No results for input(s): TROPONINI in the last 168 hours. No results for input(s): PROBNP in the last 168 hours.   CHEMISTRY Recent Labs  Lab 09/14/21 2141 09/15/21 0255 09/15/21 1443 09/16/21 0259 09/17/21 0420 09/18/21 0228  NA 135 136 134* 134* 132* 134*  K 3.0* 3.0* 3.3* 3.1* 3.7 3.3*  CL 94* 93* 92* 91* 89* 88*  CO2 30 33* 36* 37* 35* 40*  GLUCOSE 109* 118* 125* 116* 105* 91  BUN _0 7* 6* <5*  CREATININE 0.57 0.62 0.55 0.55 0.51 0.55  CALCIUM 7.9* 7.8* 8.2* 8.2* 8.4* 8.2*  MG 1.9 1.7  --  1.9 1.9 2.0  PHOS  --  2.8  --   --  3.0 2.5    Estimated Creatinine Clearance: 84 mL/min (by C-G formula based on SCr  of 0.55 mg/dL).   LIVER Recent Labs  Lab 09/12/21 0248 09/14/21 0004 09/17/21 0420 09/18/21 0228  AST 14* 14* 10* 11*  ALT _1 ALKPHOS 72 62 71 68  BILITOT 0.6 0.5 0.4 0.3  PROT 6.2* 5.5* 6.1* 5.8*  ALBUMIN 2.4* 2.2* 2.1* 2.0*  INR 1.1  --   --   --      This patient is critically ill with multiple organ system failure which requires frequent high complexity decision making, assessment, support, evaluation, and titration of therapies. This was completed through the application of advanced monitoring technologies and extensive  interpretation of multiple databases. During this encounter critical care time was devoted to patient care services described in this note for 42 minutes.  Julian Hy, DO 09/18/21 3:01 PM [facility]

## 2021-09-18 NOTE — Progress Notes (Signed)
Patricia Sampson for Infectious Disease   Reason for visit: Follow up on altered mental status  Interval History: remains afebrile since 10/19.  Sister at bedside.  Poor sleep overnight.   Day 8 total antibiotics Day 7 acyclovir Day 6 ceftriaxone   Physical Exam: Constitutional:  Vitals:   09/18/21 0900 09/18/21 1000  BP: 124/62 (!) 103/55  Pulse: (!) 107 100  Resp: (!) 33 (!) 22  Temp:    SpO2: (!) 86% 92%   patient appears in NAD HENT: + oxygen by nasal cannula; no meningismus Respiratory: Normal respiratory effort; CTA B Cardiovascular: RRR  Review of Systems: Unable to be assessed due to mental status She did not wake up enough to give any history  Lab Results  Component Value Date   WBC 2.5 (L) 09/18/2021   HGB 7.2 (L) 09/18/2021   HCT 25.1 (L) 09/18/2021   MCV 87.2 09/18/2021   PLT 159 09/18/2021    Lab Results  Component Value Date   CREATININE 0.55 09/18/2021   BUN <5 (L) 09/18/2021   NA 134 (L) 09/18/2021   K 3.3 (L) 09/18/2021   CL 88 (L) 09/18/2021   CO2 40 (H) 09/18/2021    Lab Results  Component Value Date   ALT 7 09/18/2021   AST 11 (L) 09/18/2021   ALKPHOS 68 09/18/2021     Microbiology: Recent Results (from the past 240 hour(s))  Blood Culture (routine x 2)     Status: None   Collection Time: 09/17/2021 11:55 AM   Specimen: BLOOD RIGHT HAND  Result Value Ref Range Status   Specimen Description   Final    BLOOD RIGHT HAND Performed at [facility], [place] [address], [place], [place] [address]    Special Requests   Final    BOTTLES DRAWN AEROBIC AND ANAEROBIC Blood Culture adequate volume Performed at [place] [address], [place], [place] [address]    Culture   Final    NO GROWTH 5 DAYS Performed at [facility], [place] [address] [place], [place] [address]    Report Status 09/16/2021 FINAL  Final  Blood Culture (routine x 2)     Status: None   Collection Time: 09/20/2021 11:55 AM    Specimen: BLOOD  Result Value Ref Range Status   Specimen Description   Final    BLOOD LEFT ANTECUBITAL Performed at [place] [address] [place], [place] [address]    Special Requests   Final    BOTTLES DRAWN AEROBIC AND ANAEROBIC Blood Culture results may not be optimal due to an excessive volume of blood received in culture bottles Performed at [place] [address], [place], [place] [address]    Culture   Final    NO GROWTH 5 DAYS Performed at [facility], [place] [address], [place], [place] [address]    Report Status 09/16/2021 FINAL  Final  Resp Panel by RT-PCR (Flu A&B, Covid) Nasopharyngeal Swab     Status: None   Collection Time: 08/28/2021  1:50 PM   Specimen: Nasopharyngeal Swab; Nasopharyngeal(NP) swabs in vial transport medium  Result Value Ref Range Status   SARS Coronavirus 2 by RT PCR NEGATIVE NEGATIVE Final    Comment: (NOTE) SARS-CoV-2 target nucleic acids are NOT DETECTED.  The SARS-CoV-2 RNA is generally detectable in upper respiratory specimens during the acute phase of infection. The lowest concentration of SARS-CoV-2 viral copies this assay can detect  is 138 copies/mL. A negative result does not preclude SARS-Cov-2 infection and should not be used as the sole basis for treatment or other patient management decisions. A negative result may occur with  improper specimen collection/handling, submission of specimen other than nasopharyngeal swab, presence of viral mutation(s) within the areas targeted by this assay, and inadequate number of viral copies(<138 copies/mL). A negative result must be combined with clinical observations, patient history, and epidemiological information. The expected result is Negative.  Fact Sheet for Patients:  EntrepreneurPulse.com.au  Fact Sheet for Healthcare Providers:  IncredibleEmployment.be  This test is no t yet approved or  cleared by the [place] FDA and  has been authorized for detection and/or diagnosis of SARS-CoV-2 by FDA under an Emergency Use Authorization (EUA). This EUA will remain  in effect (meaning this test can be used) for the duration of the COVID-19 declaration under Section 564(b)(1) of the Act, 21 U.S.C.section 360bbb-3(b)(1), unless the authorization is terminated  or revoked sooner.       Influenza A by PCR NEGATIVE NEGATIVE Final   Influenza B by PCR NEGATIVE NEGATIVE Final    Comment: (NOTE) The Xpert Xpress SARS-CoV-2/FLU/RSV plus assay is intended as an aid in the diagnosis of influenza from Nasopharyngeal swab specimens and should not be used as a sole basis for treatment. Nasal washings and aspirates are unacceptable for Xpert Xpress SARS-CoV-2/FLU/RSV testing.  Fact Sheet for Patients: EntrepreneurPulse.com.au  Fact Sheet for Healthcare Providers: IncredibleEmployment.be  This test is not yet approved or cleared by the [place] FDA and has been authorized for detection and/or diagnosis of SARS-CoV-2 by FDA under an Emergency Use Authorization (EUA). This EUA will remain in effect (meaning this test can be used) for the duration of the COVID-19 declaration under Section 564(b)(1) of the Act, 21 U.S.C. section 360bbb-3(b)(1), unless the authorization is terminated or revoked.  Performed at [facility], [place] [address], [place], [place] [address]   Urine Culture     Status: None   Collection Time: 09/15/2021  2:57 PM   Specimen: Urine, Random  Result Value Ref Range Status   Specimen Description   Final    URINE, RANDOM Performed at [place] [address] [place], [place] [address]    Special Requests   Final    NONE Performed at [facility], [place] [address], [place], [place] [address]    Culture   Final    NO GROWTH Performed at [facility],  [place] [address], [place], [place] [address]    Report Status 09/12/2021 FINAL  Final  MRSA Next Gen by PCR, Nasal     Status: None   Collection Time: 08/30/2021  9:13 PM   Specimen: Nasal Mucosa; Nasal Swab  Result Value Ref Range Status   MRSA by PCR Next Gen NOT DETECTED NOT DETECTED Final    Comment: (NOTE) The GeneXpert MRSA Assay (FDA approved for NASAL specimens only), is one component of a comprehensive MRSA colonization surveillance program. It is not intended to diagnose MRSA infection nor to guide or monitor treatment for MRSA infections. Test performance is not FDA approved in patients less than 65 years old. Performed at [facility], [place] [address] [place], [place] [address]     Impression/Plan:  1. Altered mental status - some improvement overall but continues to be weak and does not respond much this am, likely due to fatigue and poor sleep.  She remains afebrile and unfortunately  no LP able to be done to rule out any infectious process.  No concerns on exam of ongoing meningitic symptoms though and likely with the encephalopathy, would be more concerning for a type of viral encephalitis, though unknown without the LP.  Therefore, I will stop the ceftriaxone and just continue with acyclovir for possible encephalitis.  ? Other etiologies such as brain mets.  MRI with significant motion.   2.  Left lung adenocarcinoma - now with metastatic disease to the left second rib.  Unable to do her scheduled radiation today.  Certainly may be cause of #1.    3.  Lymphopenia - ongoing secondary to chemotherapy.  Has been stable and will continue to monitor.

## 2021-09-18 NOTE — Progress Notes (Signed)
SLP Cancellation Note  Patient Details Name: Patricia Sampson MRN: [id number] DOB: 1956/01/15   Cancelled treatment:       Reason Eval/Treat Not Completed: Medical issues which prohibited therapy. Per RN, patient was able to eat some PO's this morning but she then had to go on BiPap and has been on that all day. SLP to f/u with patient tomorrow.  Sonia Baller, MA, CCC-SLP Speech Therapy

## 2021-09-18 NOTE — Progress Notes (Signed)
Patricia Sampson was sleeping at time of visit, but Chaplain was able to check in with patient through talking with nurse.  Nurse did a great of explaining the challenges Patricia Sampson is facing right now.  Chaplain has spent time with Patricia Sampson's son and husband in the past. They have both conveyed their disbelief around the changes happening with Patricia Sampson's awareness and cognition.     Chaplain will follow-up.    09/18/21 1200  Clinical Encounter Type  Visited With Patient not available  Visit Type Follow-up

## 2021-09-18 NOTE — Progress Notes (Signed)
PT Cancellation Note  Patient Details Name: Patricia Sampson MRN: [id number] DOB: 08/23/1956   Cancelled Treatment:    Reason Eval/Treat Not Completed: Medical issues which prohibited therapy, on BiPAP, has meeting with Palliative Care Team today.    Claretha Cooper 09/18/2021, 5:09 PM [place] Pager [phone] Office [phone]

## 2021-09-18 NOTE — Progress Notes (Signed)
PROGRESS NOTE  Patricia Sampson:[id number] DOB: 10-03-1956 DOA: 09/22/2021 PCP: Greig Right, MD   LOS: 7 days   Brief narrative:  Patricia Sampson is a 65 y.o. 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  In the ED, patient was tachypneic and tachycardic and had pancytopenia.  Last chemotherapy was 09/05/2021.  Chest x-ray and CT head scan was negative.  Patient was started on broad-spectrum antibiotics for possible sepsis and patient was admitted to the hospital.    Assessment/Plan:  Principal Problem:   SIRS (systemic inflammatory response syndrome) (HCC) Active Problems:   COPD (chronic obstructive pulmonary disease) (HCC)   Chronic respiratory failure with hypoxia (HCC)   Altered mental status   PAF (paroxysmal atrial fibrillation) (HCC)   Acute on chronic respiratory failure with hypoxia and hypercapnia (HCC)  Acute metabolic encephalopathy.   Improved.  Encouraged her using BiPAP.  Empirically on antibiotic for possible meningitis initially.  ID and neurology was consulted.  Blood culture urine culture negative so far.  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  Also ID recommendations on antibiotic.  Low vitamin B-12.  Goal more than 400.  Continue vitamin B12  Paroxysmal A. fib with RVR.  Cardiology on board.  On heparin and amiodarone drip.  CHA2DS2-VASc score of 2.  Pancytopenia noted.  Risk of bleeding from anticoagulation in the long-term.  Rate controlled at this time.  TSH within normal limits.  Hypokalemia.  Patient will receive IV potassium and will follow closely.  Acute on chronic hypoxic respiratory failure.  Patient has severe COPD and 4 to 6  L of oxygen at baseline.  Patient required nonrebreather mask on admission.  Currently on 8 L of oxygen by nasal cannula PCCM on board.  Had a prolonged discussion about nocturnal BiPAP with her yesterday which she agreed on able to keep for few hours.  Adenocarcinoma of the lung on chemoradiation.  Stage IIIa with chest wall invasion.  With pancytopenia on presentation.  Follows up with Dr. Theda Belfast as outpatient.  Palliative care has been consulted for goals of care.  Palliative care consulting with radiation oncology for further management plan.  Pancytopenia.  Mild, latest hemoglobin of 7.2 and WBC at 13.5.  Platelet improved at 159  Difficult IV access.  Status post PICC line placement.  Essential hypertension.  Borderline low blood pressure today.  Need to monitor closely.  Ethics/goals of care patient with multiple comorbidities and lung cancer.  Overall prognosis is guarded.  Very likely to have adverse outcome if CPR or intubation is planned.  Palliative care on board.  Spoke with the patient's sister at bedside.  Palliative care is having a family meeting today.  DVT prophylaxis: SCDs Start: 09/10/2021 2128  Code Status: Full code  Family Communication: Spoke with the patient's sister at bedside.  Status is: Inpatient  Remains inpatient appropriate because:  on multiple IV drips and IV antibiotics, respiratory failure, goals of care discussion  Consultants: PCCM Infectious disease Neurology Palliative care  Procedures: EEG BiPAP placement  Anti-infectives:  Acyclovir Ampicillin Rocephin  Subjective:  Today, patient was seen and examined at bedside.  Patient complains of mild cough with shortness of breath.  No chest pain.  Patient's sister at bedside.  Objective: Vitals:   09/18/21 0820 09/18/21 1000  BP:  (!) 103/55  Pulse:  100  Resp:  (!) 22  Temp: 97.7 F (36.5 C)   SpO2: 99% 92%    Intake/Output Summary (Last 24 hours) at 09/18/2021 1014 Last data  filed at 09/18/2021 1005 Gross per 24 hour  Intake 3197.06 ml  Output 1100 ml  Net 2097.06 ml    Filed Weights   09/16/21 1200 09/16/21 1600 09/18/21 0500  Weight: 75 kg 75 kg 85.4 kg   Body mass index is 27.8 kg/m.   Physical Exam:  GENERAL: Patient is awake and oriented to place and person.  Appears weak and deconditioned.  On high flow nasal cannula. HENT: No scleral pallor or icterus. Pupils equally reactive to light. Oral mucosa is moist NECK: is supple, no gross swelling noted. CHEST: Decreased breath sounds bilaterally.  Coarse breath sounds noted. CVS: S1 and S2 heard, no murmur. Regular rate and rhythm.  ABDOMEN: Soft, non-tender, bowel sounds are present. EXTREMITIES: No edema. CNS: Alert awake and oriented to place and person SKIN: warm and dry without rashes.  Data Review: I have personally reviewed the following laboratory data and studies,  CBC: Recent Labs  Lab 09/21/2021 1155 08/28/2021 1201 09/13/21 0254 09/14/21 0004 09/15/21 0255 09/16/21 0259 09/17/21 0420 09/18/21 0228  WBC 2.0*   < > 2.1* 2.9* 2.9* 2.4* 2.7* 2.5*  NEUTROABS 1.5*  --  1.5*  --  2.2 1.8  --   --   HGB 10.0*   < > 8.3* 7.8* 7.8* 7.4* 7.8* 7.2*  HCT 33.6*   < > 28.4* 26.9* 26.4* 25.2* 27.6* 25.1*  MCV 84.2   < > 87.4 86.5 86.0 87.5 88.5 87.2  PLT 139*   < > 129* 133* 137* 138* 131* 159   < > = values in this interval not displayed.    Basic Metabolic Panel: Recent Labs  Lab 09/14/21 2141 09/15/21 0255 09/15/21 1443 09/16/21 0259 09/17/21 0420 09/18/21 0228  NA 135 136 134* 134* 132* 134*  K 3.0* 3.0* 3.3* 3.1* 3.7 3.3*  CL 94* 93* 92* 91* 89* 88*  CO2 30 33* 36* 37* 35* 40*  GLUCOSE 109* 118* 125* 116* 105* 91  BUN _0 7* 6* <5*  CREATININE 0.57 0.62 0.55 0.55 0.51 0.55  CALCIUM 7.9* 7.8* 8.2* 8.2* 8.4* 8.2*  MG 1.9 1.7  --  1.9 1.9 2.0  PHOS  --  2.8  --   --  3.0 2.5    Liver Function Tests: Recent Labs  Lab 09/05/2021 1155 09/12/21 0248 09/14/21 0004  09/17/21 0420 09/18/21 0228  AST 17 14* 14* 10* 11*  ALT _1 ALKPHOS 73 72 62 71 68  BILITOT 0.6 0.6 0.5 0.4 0.3  PROT 7.1 6.2* 5.5* 6.1* 5.8*  ALBUMIN 2.7* 2.4* 2.2* 2.1* 2.0*    No results for input(s): LIPASE, AMYLASE in the last 168 hours. Recent Labs  Lab 09/08/2021 1155 09/13/2021 2302  AMMONIA 18 31    Cardiac Enzymes: No results for input(s): CKTOTAL, CKMB, CKMBINDEX, TROPONINI in the last 168 hours. BNP (last 3 results) No results for input(s): BNP in the last 8760 hours.  ProBNP (last 3 results) No results for input(s): PROBNP in the last 8760 hours.  CBG: No results for input(s): GLUCAP in the last 168 hours. Recent Results (from the past 240 hour(s))  Blood Culture (routine x 2)     Status: None   Collection  Time: 09/10/2021 11:55 AM   Specimen: BLOOD RIGHT HAND  Result Value Ref Range Status   Specimen Description   Final    BLOOD RIGHT HAND Performed at [facility] [address] [place], [place] [address]    Special Requests   Final    BOTTLES DRAWN AEROBIC AND ANAEROBIC Blood Culture adequate volume Performed at [place] [address] [place], [place] [address]    Culture   Final    NO GROWTH 5 DAYS Performed at [facility], [place] [address], [place], [place] [address]    Report Status 09/16/2021 FINAL  Final  Blood Culture (routine x 2)     Status: None   Collection Time: 09/21/2021 11:55 AM   Specimen: BLOOD  Result Value Ref Range Status   Specimen Description   Final    BLOOD LEFT ANTECUBITAL Performed at [place] [address], [place], [place] [address]    Special Requests   Final    BOTTLES DRAWN AEROBIC AND ANAEROBIC Blood Culture results may not be optimal due to an excessive volume of blood received in culture bottles Performed at [place] [address] [place], [place] [address]    Culture   Final    NO GROWTH 5 DAYS Performed  at [facility], [place] [address] [place], [place] [address]    Report Status 09/16/2021 FINAL  Final  Resp Panel by RT-PCR (Flu A&B, Covid) Nasopharyngeal Swab     Status: None   Collection Time: 09/21/2021  1:50 PM   Specimen: Nasopharyngeal Swab; Nasopharyngeal(NP) swabs in vial transport medium  Result Value Ref Range Status   SARS Coronavirus 2 by RT PCR NEGATIVE NEGATIVE Final    Comment: (NOTE) SARS-CoV-2 target nucleic acids are NOT DETECTED.  The SARS-CoV-2 RNA is generally detectable in upper respiratory specimens during the acute phase of infection. The lowest concentration of SARS-CoV-2 viral copies this assay can detect is 138 copies/mL. A negative result does not preclude SARS-Cov-2 infection and should not be used as the sole basis for treatment or other patient management decisions. A negative result may occur with  improper specimen collection/handling, submission of specimen other than nasopharyngeal swab, presence of viral mutation(s) within the areas targeted by this assay, and inadequate number of viral copies(<138 copies/mL). A negative result must be combined with clinical observations, patient history, and epidemiological information. The expected result is Negative.  Fact Sheet for Patients:  EntrepreneurPulse.com.au  Fact Sheet for Healthcare Providers:  IncredibleEmployment.be  This test is no t yet approved or cleared by the [place] FDA and  has been authorized for detection and/or diagnosis of SARS-CoV-2 by FDA under an Emergency Use Authorization (EUA). This EUA will remain  in effect (meaning this test can be used) for the duration of the COVID-19 declaration under Section 564(b)(1) of the Act, 21 U.S.C.section 360bbb-3(b)(1), unless the authorization is terminated  or revoked sooner.       Influenza A by PCR NEGATIVE NEGATIVE Final   Influenza B by PCR NEGATIVE NEGATIVE Final    Comment: (NOTE) The  Xpert Xpress SARS-CoV-2/FLU/RSV plus assay is intended as an aid in the diagnosis of influenza from Nasopharyngeal swab specimens and should not be used as a sole basis for treatment. Nasal washings and aspirates are unacceptable for Xpert Xpress SARS-CoV-2/FLU/RSV testing.  Fact Sheet for Patients: EntrepreneurPulse.com.au  Fact Sheet for Healthcare Providers: IncredibleEmployment.be  This test is not yet approved  or cleared by the [place] and has been authorized for detection and/or diagnosis of SARS-CoV-2 by FDA under an Emergency Use Authorization (EUA). This EUA will remain in effect (meaning this test can be used) for the duration of the COVID-19 declaration under Section 564(b)(1) of the Act, 21 U.S.C. section 360bbb-3(b)(1), unless the authorization is terminated or revoked.  Performed at [facility], [place] [address] [place], [place] [address]   Urine Culture     Status: None   Collection Time: 08/28/2021  2:57 PM   Specimen: Urine, Random  Result Value Ref Range Status   Specimen Description   Final    URINE, RANDOM Performed at [place] [address], [place], [place] [address]    Special Requests   Final    NONE Performed at [facility], [place] [address] [place], [place] [address]    Culture   Final    NO GROWTH Performed at [facility], [place] [address], [place], [place] [address]    Report Status 09/12/2021 FINAL  Final  MRSA Next Gen by PCR, Nasal     Status: None   Collection Time: 09/02/2021  9:13 PM   Specimen: Nasal Mucosa; Nasal Swab  Result Value Ref Range Status   MRSA by PCR Next Gen NOT DETECTED NOT DETECTED Final    Comment: (NOTE) The GeneXpert MRSA Assay (FDA approved for NASAL specimens only), is one component of a comprehensive MRSA colonization surveillance program. It is not intended to diagnose MRSA infection nor to guide or  monitor treatment for MRSA infections. Test performance is not FDA approved in patients less than 79 years old. Performed at [facility], [place] [address] [place], [place] [address]       Studies: DG CHEST PORT 1 VIEW  Result Date: 09/17/2021 CLINICAL DATA:  Stage IIIA adenocarcinoma with large left upper lobe mass and chest wall invasion, undergoing chemotherapy. EXAM: PORTABLE CHEST 1 VIEW COMPARISON:  Radiograph 09/15/2021; CT chest 09/09/2021 FINDINGS: Right upper extremity PICC tip projects at the level of the upper SVC. Nodular opacity overlying the left upper lobe, consistent with left lung mass seen on CT 09/06/2021. Nodular opacity at the left hilum corresponds to the enlarged node seen on cross-sectional imaging. Diffuse interstitial thickening, similar to 09/15/2021. Small bilateral pleural effusions and increasing patchy airspace opacities in the lung bases. No pneumothorax. Heart size is difficult to assess due to adjacent consolidations but appears stable. IMPRESSION: 1. Right upper extremity PICC tip projects at the level of the upper SVC. 2. Increasing bilateral small pleural effusions and bibasilar airspace opacities, favored to represent atelectasis, though infection is not excluded. 3. Diffuse hazy interstitial thickening is favored to reflect pulmonary edema but could also represent lymphangitic carcinomatosis. 4. No significant change in the nodular opacity in the left upper lung, corresponding to the left upper lobe mass better appreciated on prior cross-sectional imaging. Electronically Signed   By: Ileana Roup M.D.   On: 09/17/2021 11:09      Flora Lipps, MD  [facility] 09/18/2021  If 7PM-7AM, please contact night-coverage

## 2021-09-19 ENCOUNTER — Other Ambulatory Visit: Payer: Self-pay | Admitting: Physician Assistant

## 2021-09-19 ENCOUNTER — Ambulatory Visit: Payer: 59 | Admitting: Physician Assistant

## 2021-09-19 ENCOUNTER — Ambulatory Visit: Payer: 59

## 2021-09-19 ENCOUNTER — Other Ambulatory Visit: Payer: 59

## 2021-09-19 ENCOUNTER — Ambulatory Visit
Admission: RE | Admit: 2021-09-19 | Discharge: 2021-09-19 | Disposition: A | Discharge status: Home / Self Care | Payer: 59 | Source: Ambulatory Visit | Attending: Radiation Oncology | Admitting: Radiation Oncology

## 2021-09-19 DIAGNOSIS — C7951 Secondary malignant neoplasm of bone: Secondary | ICD-10-CM

## 2021-09-19 DIAGNOSIS — Z7189 Other specified counseling: Secondary | ICD-10-CM | POA: Diagnosis not present

## 2021-09-19 DIAGNOSIS — Z515 Encounter for palliative care: Secondary | ICD-10-CM

## 2021-09-19 DIAGNOSIS — J441 Chronic obstructive pulmonary disease with (acute) exacerbation: Secondary | ICD-10-CM

## 2021-09-19 DIAGNOSIS — R6510 Systemic inflammatory response syndrome (SIRS) of non-infectious origin without acute organ dysfunction: Secondary | ICD-10-CM | POA: Diagnosis not present

## 2021-09-19 DIAGNOSIS — I480 Paroxysmal atrial fibrillation: Secondary | ICD-10-CM

## 2021-09-19 DIAGNOSIS — C3492 Malignant neoplasm of unspecified part of left bronchus or lung: Secondary | ICD-10-CM | POA: Diagnosis not present

## 2021-09-19 DIAGNOSIS — J9621 Acute and chronic respiratory failure with hypoxia: Secondary | ICD-10-CM | POA: Diagnosis not present

## 2021-09-19 LAB — CBC
HCT: 27.3 % — ABNORMAL LOW (ref 36.0–46.0)
Hemoglobin: 8 g/dL — ABNORMAL LOW (ref 12.0–15.0)
MCH: 25.5 pg — ABNORMAL LOW (ref 26.0–34.0)
MCHC: 29.3 g/dL — ABNORMAL LOW (ref 30.0–36.0)
MCV: 86.9 fL (ref 80.0–100.0)
Platelets: 131 10*3/uL — ABNORMAL LOW (ref 150–400)
RBC: 3.14 MIL/uL — ABNORMAL LOW (ref 3.87–5.11)
RDW: 18.7 % — ABNORMAL HIGH (ref 11.5–15.5)
WBC: 3 10*3/uL — ABNORMAL LOW (ref 4.0–10.5)
nRBC: 0 % (ref 0.0–0.2)

## 2021-09-19 LAB — BASIC METABOLIC PANEL
Anion gap: 6 (ref 5–15)
BUN: 5 mg/dL — ABNORMAL LOW (ref 8–23)
CO2: 46 mmol/L — ABNORMAL HIGH (ref 22–32)
Calcium: 8.3 mg/dL — ABNORMAL LOW (ref 8.9–10.3)
Chloride: 83 mmol/L — ABNORMAL LOW (ref 98–111)
Creatinine, Ser: 0.6 mg/dL (ref 0.44–1.00)
GFR, Estimated: >60 mL/min (ref >60)
Glucose, Bld: 118 mg/dL — ABNORMAL HIGH (ref 70–99)
Potassium: 3.6 mmol/L (ref 3.5–5.1)
Sodium: 135 mmol/L (ref 135–145)

## 2021-09-19 LAB — HEPARIN LEVEL (UNFRACTIONATED): Heparin Unfractionated: 0.59 IU/mL (ref 0.30–0.70)

## 2021-09-19 LAB — MAGNESIUM: Magnesium: 1.5 mg/dL — ABNORMAL LOW (ref 1.7–2.4)

## 2021-09-19 MED ORDER — AMIODARONE HCL 200 MG PO TABS
200.0000 mg | ORAL_TABLET | Freq: Every day | ORAL | Status: DC
  Administered 2021-09-19 – 2021-09-29 (×11): 200 mg via ORAL
  Filled 2021-09-19 (×11): qty 1

## 2021-09-19 MED ORDER — DEXTROSE 5 % IV SOLN
10.0000 mg/kg | Freq: Three times a day (TID) | INTRAVENOUS | Status: AC
  Administered 2021-09-19 – 2021-09-26 (×21): 745 mg via INTRAVENOUS
  Filled 2021-09-19 (×2): qty 14.9
  Filled 2021-09-19: qty 10
  Filled 2021-09-19: qty 5
  Filled 2021-09-19 (×3): qty 14.9
  Filled 2021-09-19: qty 10
  Filled 2021-09-19 (×2): qty 14.9
  Filled 2021-09-19: qty 10
  Filled 2021-09-19 (×11): qty 14.9

## 2021-09-19 MED ORDER — FUROSEMIDE 10 MG/ML IJ SOLN
60.0000 mg | Freq: Two times a day (BID) | INTRAMUSCULAR | Status: AC
  Administered 2021-09-19 (×2): 60 mg via INTRAVENOUS
  Filled 2021-09-19 (×2): qty 6

## 2021-09-19 NOTE — Progress Notes (Signed)
Progress Note  Patient Name: Patricia Sampson Date of Encounter: 09/19/2021  [facility] Cardiologist: Mertie Moores, MD   Subjective   Alert and oriented. Looks comfortable. Maintaining NSR. Tolerating fluids, apple sauce.  Inpatient Medications    Scheduled Meds:  chlorhexidine  15 mL Mouth Rinse BID   Chlorhexidine Gluconate Cloth  6 each Topical Daily   feeding supplement  237 mL Oral TID BM   furosemide  60 mg Intravenous BID   ipratropium-albuterol  3 mL Nebulization Q4H   mouth rinse  15 mL Mouth Rinse q12n4p   methylPREDNISolone (SOLU-MEDROL) injection  40 mg Intravenous Q24H   sodium chloride flush  10-40 mL Intracatheter Q12H   vitamin B-12  1,000 mcg Oral Daily   Continuous Infusions:  sodium chloride 250 mL (09/16/21 1617)   sodium chloride Stopped (09/18/21 0523)   acyclovir (ZOVIRAX) [phone] mg IVPB Stopped (09/19/21 0606)   amiodarone 30 mg/hr (09/19/21 0938)   heparin 1,400 Units/hr (09/19/21 0938)   PRN Meds: sodium chloride, acetaminophen **OR** acetaminophen, fentaNYL (SUBLIMAZE) injection, ipratropium-albuterol, LORazepam, ondansetron **OR** ondansetron (ZOFRAN) IV, sodium chloride flush   Vital Signs    Vitals:   09/19/21 0812 09/19/21 0900 09/19/21 1000 09/19/21 1112  BP:  117/65 122/68   Pulse:  95    Resp:  (!) 23 20   Temp: 97.9 F (36.6 C)     TempSrc: Oral     SpO2: 100% (!) 88%  92%  Weight:      Height:        Intake/Output Summary (Last 24 hours) at 09/19/2021 1123 Last data filed at 09/19/2021 0938 Gross per 24 hour  Intake 1319.37 ml  Output 1200 ml  Net 119.37 ml   Last 3 Weights 09/18/2021 09/16/2021 09/16/2021  Weight (lbs) 188 lb 4.4 oz 165 lb 5.5 oz 165 lb 5.5 oz  Weight (kg) 85.4 kg 75 kg 75 kg      Telemetry    Sinus rhythm/sinus tachycardia, no evidence of recurrent atrial fibrillation.- Personally Reviewed  ECG    No new tracing- Personally Reviewed  Physical Exam  Stuporous.  Wearing BiPAP GEN:  No acute distress.   Neck: No JVD Cardiac: RRR, no murmurs, rubs, or gallops.  Respiratory: Diminished breath sounds throughout, but otherwise clear to auscultation bilaterally. GI: Soft, nontender, non-distended  MS: No edema; No deformity. Neuro: grossly nonfocal Psych: normal affect  Labs    High Sensitivity Troponin:  No results for input(s): TROPONINIHS in the last 720 hours.   Chemistry Recent Labs  Lab 09/14/21 0004 09/14/21 2141 09/17/21 0420 09/18/21 0228 09/19/21 0513  NA 139   < > 132* 134* 135  K 3.3*   < > 3.7 3.3* 3.6  CL 97*   < > 89* 88* 83*  CO2 34*   < > 35* 40* 46*  GLUCOSE 112*   < > 105* 91 118*  BUN 14   < > 6* <5* 5*  CREATININE 0.74   < > 0.51 0.55 0.60  CALCIUM 7.4*   < > 8.4* 8.2* 8.3*  MG 1.2*   < > 1.9 2.0 1.5*  PROT 5.5*  --  6.1* 5.8*  --   ALBUMIN 2.2*  --  2.1* 2.0*  --   AST 14*  --  10* 11*  --   ALT 9  --  8 7  --   ALKPHOS 62  --  71 68  --   BILITOT 0.5  --  0.4 0.3  --  GFRNONAA >60   < > >60 >60 >60  ANIONGAP 8   < > _0 < > = values in this interval not displayed.    Lipids No results for input(s): CHOL, TRIG, HDL, LABVLDL, LDLCALC, CHOLHDL in the last 168 hours.  Hematology Recent Labs  Lab 09/17/21 0420 09/18/21 0228 09/19/21 0513  WBC 2.7* 2.5* 3.0*  RBC 3.12* 2.88* 3.14*  HGB 7.8* 7.2* 8.0*  HCT 27.6* 25.1* 27.3*  MCV 88.5 87.2 86.9  MCH 25.0* 25.0* 25.5*  MCHC 28.3* 28.7* 29.3*  RDW 18.6* 18.8* 18.7*  PLT 131* 159 131*   Thyroid No results for input(s): TSH, FREET4 in the last 168 hours.  BNP Recent Labs  Lab 09/18/21 0228  BNP 80.3    DDimer No results for input(s): DDIMER in the last 168 hours.   Radiology    No results found.  Cardiac Studies   2D echo 08/2021   1. Left ventricular ejection fraction, by estimation, is 55 to 60%. The  left ventricle has normal function. The left ventricle has no regional  wall motion abnormalities. Left ventricular diastolic parameters are  consistent with  Grade I diastolic  dysfunction (impaired relaxation).   2. There is mild respirophasic variation of interventricular septum  position, possibly due to the patient's underlying lung disease. Right  ventricular systolic function is normal. The right ventricular size is  normal.   3. The mitral valve is normal in structure. No evidence of mitral valve  regurgitation. No evidence of mitral stenosis.   4. The aortic valve is tricuspid. Aortic valve regurgitation is not  visualized. No aortic stenosis is present.   5. The inferior vena cava is dilated in size with <50% respiratory  variability, suggesting right atrial pressure of 15 mmHg.   Patient Profile     65 y.o. female with severe COPD with chronic hypoxic respiratory failure on 4-8L, HTN, HLD, remote tobacco abuse, lung mass 04/2021 at OSH later confirmed as lung cancer by pathology 06/2021 undergoing chemoradiation admitted with AMS, concern for sepsis, meningitis, and worsening respiratory failure. Imaging shows progressive bronchogenic carcinoma with increased destruction of anterior left second rib, new perilymphatic nodularity in the left upper lobe adjacent to the mass, consistent with lymphangitic carcinomatosis, and Unchanged and left suprahilar nodal metastatic disease. Requiring BiPAP for oxygenation; palliative care consultation planned for goals of care. Cardiology consulted for atrial fibrillation, converted on IV amiodarone.  Assessment & Plan    No recurrence of paroxysmal atrial fibrillation.  Significant clinical improvement after resolution of hypercapnia, but her overall condition seems poor and her respiratory status is marginal.  Palliative care has been consulted, but family would like feedback regarding her response to external beam radiation therapy before making a decision about further care.   Switch amiodarone to PO. Unless primary team anticipates invasive procedures, switch to oral anticoagulants (Eliquis 5 mg twice  daily). If no recurrence of afib as an outpatient, may consider stopping the amio and anticoagulant.  [facility] will sign off.   Medication Recommendations:  amiodarone 200 mg daily, Eliquis 5 mg twice daily Other recommendations (labs, testing, etc):  30 day event monitor as OP Follow up as an outpatient:  after event monitor is completed    For questions or updates, please contact [place] Please consult [email or web address] for contact info under        Signed, Sanda Klein, MD  09/19/2021, 11:23 AM

## 2021-09-19 NOTE — Progress Notes (Signed)
[facility] for Infectious Disease   Reason for visit: Follow up on altered mental status  Interval History: remains afebrile.  WBC 3.0.  sister at bedside.   Day 9 total antibiotics Day 8 acyclovir   Physical Exam: Constitutional:  Vitals:   09/19/21 1208 09/19/21 1511  BP:    Pulse:    Resp:    Temp: 97.8 F (36.6 C) 97.6 F (36.4 C)  SpO2:  97%  She appears in nad HENT: + nasal cannula Respiratory: normal respriatory effort Cardiovascular: irr Neuro: alert, able to answer questions  Review of Systems: Constitutional: no fever, no chills  Lab Results  Component Value Date   WBC 3.0 (L) 09/19/2021   HGB 8.0 (L) 09/19/2021   HCT 27.3 (L) 09/19/2021   MCV 86.9 09/19/2021   PLT 131 (L) 09/19/2021    Lab Results  Component Value Date   CREATININE 0.60 09/19/2021   BUN 5 (L) 09/19/2021   NA 135 09/19/2021   K 3.6 09/19/2021   CL 83 (L) 09/19/2021   CO2 46 (H) 09/19/2021    Lab Results  Component Value Date   ALT 7 09/18/2021   AST 11 (L) 09/18/2021   ALKPHOS 68 09/18/2021     Microbiology: Recent Results (from the past 240 hour(s))  Blood Culture (routine x 2)     Status: None   Collection Time: 09/06/2021 11:55 AM   Specimen: BLOOD RIGHT HAND  Result Value Ref Range Status   Specimen Description   Final    BLOOD RIGHT HAND Performed at [facility], [place] [address], [place], [place] [address]    Special Requests   Final    BOTTLES DRAWN AEROBIC AND ANAEROBIC Blood Culture adequate volume Performed at [employer] [address], [place], [place] [address]    Culture   Final    NO GROWTH 5 DAYS Performed at [facility], [place] [address], [place], [place] [address]    Report Status 09/16/2021 FINAL  Final  Blood Culture (routine x 2)     Status: None   Collection Time: 09/05/2021 11:55 AM   Specimen: BLOOD  Result Value Ref Range Status   Specimen Description   Final    BLOOD LEFT  ANTECUBITAL Performed at [facility] [facility] [address], [place], [place] [address]    Special Requests   Final    BOTTLES DRAWN AEROBIC AND ANAEROBIC Blood Culture results may not be optimal due to an excessive volume of blood received in culture bottles Performed at [place] [address], [place], [place] [address]    Culture   Final    NO GROWTH 5 DAYS Performed at [facility], [place] [address] [place], [place] [address]    Report Status 09/16/2021 FINAL  Final  Resp Panel by RT-PCR (Flu A&B, Covid) Nasopharyngeal Swab     Status: None   Collection Time: 09/01/2021  1:50 PM   Specimen: Nasopharyngeal Swab; Nasopharyngeal(NP) swabs in vial transport medium  Result Value Ref Range Status   SARS Coronavirus 2 by RT PCR NEGATIVE NEGATIVE Final    Comment: (NOTE) SARS-CoV-2 target nucleic acids are NOT DETECTED.  The SARS-CoV-2 RNA is generally detectable in upper respiratory specimens during the acute phase of infection. The lowest concentration of SARS-CoV-2 viral copies this assay can detect is 138 copies/mL. A negative result does not preclude SARS-Cov-2 infection and should not be used as the sole basis  for treatment or other patient management decisions. A negative result may occur with  improper specimen collection/handling, submission of specimen other than nasopharyngeal swab, presence of viral mutation(s) within the areas targeted by this assay, and inadequate number of viral copies(<138 copies/mL). A negative result must be combined with clinical observations, patient history, and epidemiological information. The expected result is Negative.  Fact Sheet for Patients:  EntrepreneurPulse.com.au  Fact Sheet for Healthcare Providers:  IncredibleEmployment.be  This test is no t yet approved or cleared by the [place] FDA and  has been authorized for detection and/or diagnosis of  SARS-CoV-2 by FDA under an Emergency Use Authorization (EUA). This EUA will remain  in effect (meaning this test can be used) for the duration of the COVID-19 declaration under Section 564(b)(1) of the Act, 21 U.S.C.section 360bbb-3(b)(1), unless the authorization is terminated  or revoked sooner.       Influenza A by PCR NEGATIVE NEGATIVE Final   Influenza B by PCR NEGATIVE NEGATIVE Final    Comment: (NOTE) The Xpert Xpress SARS-CoV-2/FLU/RSV plus assay is intended as an aid in the diagnosis of influenza from Nasopharyngeal swab specimens and should not be used as a sole basis for treatment. Nasal washings and aspirates are unacceptable for Xpert Xpress SARS-CoV-2/FLU/RSV testing.  Fact Sheet for Patients: EntrepreneurPulse.com.au  Fact Sheet for Healthcare Providers: IncredibleEmployment.be  This test is not yet approved or cleared by the [place] FDA and has been authorized for detection and/or diagnosis of SARS-CoV-2 by FDA under an Emergency Use Authorization (EUA). This EUA will remain in effect (meaning this test can be used) for the duration of the COVID-19 declaration under Section 564(b)(1) of the Act, 21 U.S.C. section 360bbb-3(b)(1), unless the authorization is terminated or revoked.  Performed at [facility], [place] [address], [place], [place] [address]   Urine Culture     Status: None   Collection Time: 08/27/2021  2:57 PM   Specimen: Urine, Random  Result Value Ref Range Status   Specimen Description   Final    URINE, RANDOM Performed at [place] [address], [place], [place] [address]    Special Requests   Final    NONE Performed at [facility], [place] [address], [place], [place] [address]    Culture   Final    NO GROWTH Performed at [facility], [place] [address], [place], [place] [address]    Report Status 09/12/2021 FINAL  Final  MRSA Next Gen  by PCR, Nasal     Status: None   Collection Time: 09/13/2021  9:13 PM   Specimen: Nasal Mucosa; Nasal Swab  Result Value Ref Range Status   MRSA by PCR Next Gen NOT DETECTED NOT DETECTED Final    Comment: (NOTE) The GeneXpert MRSA Assay (FDA approved for NASAL specimens only), is one component of a comprehensive MRSA colonization surveillance program. It is not intended to diagnose MRSA infection nor to guide or monitor treatment for MRSA infections. Test performance is not FDA approved in patients less than 67 years old. Performed at [facility], [place] [address], [place], [place] [address]     Impression/Plan:  1. Altered mental status - much more alert and interactive today.  No new concerns.  She remains on acyclovir for concern for encephalitis, though no LP so not able to rule in or out.   Will plan on 14 days of IV acyclovir total and stop.   2.  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  Chemoradiation when tolerated.    3.  Lymphopenia - stable and WBC 3.0 today.  Can continue to monitor intermittently.   I will sign off, call with any questions.

## 2021-09-19 NOTE — TOC Initial Note (Signed)
Transition of Care [facility]) - Initial/Assessment Note    Patient Details  Name: Patricia Sampson MRN: [id number] Date of Birth: [birth year]  [facility] of [facility]) CM/SW Contact:    Leeroy Cha, RN Phone Number: 09/19/2021, 10:02 AM  Clinical Narrative:                 65 year old woman with very severe COPD, associated chronic hypoxemic respiratory failure (4-8 L/min), hypertension, hyperlipidemia.  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 She was noted to have altered mental status on the morning of 10/17, appeared to be normal when she went to bed the night before.  In the emergency department she was tachycardic, tachypneic with pancytopenia.  Last chemotherapy infusion appears to have been 09/05/2021.  Acute metabolic encephalopathy.  She was treated empirically for possible sepsis, source unknown. Home med list with Percocet, Neurontin.  Her husband notes that he does not believe she is been taking any of her medicines for the last 2 to 3 days.     Pertinent  Medical History        Past Medical History:  Diagnosis Date   Angioedema     COPD (chronic obstructive pulmonary disease) (HCC)     FHx: migraine headaches     Hyperlipidemia     Hypertension     Seasonal allergies        Significant Hospital Events: Including procedures, antibiotic start and stop dates in addition to other pertinent events   CT abdomen pelvis 10/17 >> no acute findings Head CT 10/17 >> normal CT-PA chest 10/17 >> no new infiltrates or significant change in the left upper lobe mass.  No PE MRI brain 10/17 >> Motion degradation compromise interpretation, no acute or subacute infarct, no mass lesion  Progressive left upper lobe primary bronchogenic carcinoma with increased destruction of the anterior left second rib. New perilymphatic nodularity in the left upper lobe adjacent to the mass, consistent with  lymphangitic carcinomatosis. 3. 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   EEG 10/18 with generalized slowing, no evidence of active seizures or epileptiform activity 10/18 acyclovir and ampicillin added to cover possible meningitis 10/19 SVT, likely A. fib/flutter noted after adenosine given.  Hypotension with diltiazem, then started amiodarone.  Back in sinus tachycardia 10/20 - SVT with what looks like A. fib/flutter after adenosine given early evening 10/19.  No real response to diltiazem infusion, then given amiodarone.  Back in sinus tachycardia, . \atient continued to be aphasic and to have agitation through the day 10/19.  Now off Precedex. I/O positive 8.7 L total. Has had hypokalemia, replaced. WBC increasing, 2.9 this morning 10/20   10/21 -husband and son at the bedside.  They tell me that after she had COVID in 2020 she has been on 6 L oxygen.  For the last 3 months before admissions she has had 35 pound weight loss approximately.  August 2022 was diagnosed with cancers in September 2022 was on chemoradiation.  Since chemoradiation there is more failure to thrive with progressive decrease in activity particularly after chemoradiation cycles. Currently on 10 L nasal cannula pulse ox 95%.  Has not been on BiPAP.  Has not been on the ventilator.  Is not on pressors.  Not on Precedex   10/22 -had severe worsening of respiratory acidosis acute on chronic with a pH of 7.22 and a PCO2 of 90 associated with worsening bilateral lower lobe atelectasis [has  left upper lobe cancer] on chest x-ray compared to 09/20/2021.  Treated with BiPAP.  Blood gas subsequent improvement but did not tolerate BiPAP.  Nonetheless mental status today better.  Husband also acknowledges that she is looking better.  Cachectic elderly female.  Significant physical deconditioning . currently on 6 L nasal cannula pulse ox 95%. Continues on IV heparin infusion and amio infusion.  On acyclovir and ampicillin and  ceftriaxone for potential meningitis.  Afebrile since 09/13/2021 but leukopenic with a white count of 2.4K.  Culture negative so far Pall care consult - full code + but family meeting being arranged  [place]: 102522-bipap at 40%, extubated on 102422,  family meeting pending remains a full code at this time Will follow for progression and toc needs.    Barriers to Discharge: Continued Medical Work up   Patient Goals and CMS Choice Patient states their goals for this hospitalization and ongoing recovery are:: not stated      Expected Discharge Plan and Services     Discharge Planning Services: CM Consult   Living arrangements for the past 2 months: Single Family Home                                      Prior Living Arrangements/Services Living arrangements for the past 2 months: Single Family Home Lives with:: Spouse Patient language and need for interpreter reviewed:: Yes              Criminal Activity/Legal Involvement Pertinent to Current Situation/Hospitalization: No - Comment as needed  Activities of Daily Living Home Assistive Devices/Equipment: Wheelchair, Dentures (specify type), Eyeglasses (upper/lower dentures) ADL Screening (condition at time of admission) Patient's cognitive ability adequate to safely complete daily activities?: No Is the patient deaf or have difficulty hearing?: No Does the patient have difficulty seeing, even when wearing glasses/contacts?: No Does the patient have difficulty concentrating, remembering, or making decisions?: Yes Patient able to express need for assistance with ADLs?: No Does the patient have difficulty dressing or bathing?: Yes Independently performs ADLs?: No (secondary to lethargy and weakness) Communication: Needs assistance Is this a change from baseline?: Change from baseline, expected to last >3 days Dressing (OT): Dependent Is this a change from baseline?: Change from baseline, expected to last >3  days Grooming: Dependent Is this a change from baseline?: Change from baseline, expected to last >3 days Feeding: Dependent Is this a change from baseline?: Change from baseline, expected to last >3 days Bathing: Dependent Is this a change from baseline?: Change from baseline, expected to last >3 days Toileting: Dependent Is this a change from baseline?: Pre-admission baseline In/Out Bed: Dependent Is this a change from baseline?: Pre-admission baseline Walks in Home: Dependent (non ambulatory secondary to neuropathyand weakness) Is this a change from baseline?: Pre-admission baseline Does the patient have difficulty walking or climbing stairs?: Yes (secondary to weakness) Weakness of Legs: Both Weakness of Arms/Hands: Both  Permission Sought/Granted                  Emotional Assessment Appearance:: Appears stated age Attitude/Demeanor/Rapport: Unable to Assess Affect (typically observed): Unable to Assess Orientation: : Fluctuating Orientation (Suspected and/or reported Sundowners) Alcohol / Substance Use: Not Applicable Psych Involvement: No (comment)  Admission diagnosis:  Sepsis ([place]) [A41.9] Altered mental status, unspecified altered mental status type [R41.82] Patient Active Problem List   Diagnosis Date Noted   Acute on chronic respiratory failure  with hypoxia and hypercapnia (HCC)    PAF (paroxysmal atrial fibrillation) (HCC)    Altered mental status    SIRS (systemic inflammatory response syndrome) ([place]) 09/25/2021   Microcytic anemia 08/21/2021   Adenocarcinoma of left lung, stage 3 ([place]) 07/19/2021   Encounter for antineoplastic chemotherapy 07/19/2021   Cancer associated pain 07/19/2021   Pneumonia due to COVID-19 virus 11/25/2019   Shortness of breath 11/18/2019   Chronic respiratory failure with hypoxia ([place]) 11/18/2019   COPD (chronic obstructive pulmonary disease) ([place]) 01/27/2014   Hypoxemia 01/27/2014   Secondary pulmonary arterial hypertension  ([place]) 01/27/2014   PCP:  Greig Right, MD Pharmacy:   [id number] [place]Phone: [phone]: [phone] [facility] [place]Phone: [phone]: [phone]    Social Determinants of Health (SDOH) Interventions    Readmission Risk Interventions No flowsheet data found.

## 2021-09-19 NOTE — Progress Notes (Signed)
ANTICOAGULATION CONSULT NOTE - follow up  Pharmacy Consult for heparin Indication: atrial fibrillation  Allergies  Allergen Reactions   Dyazide [Hydrochlorothiazide W-Triamterene]     hives   Zestoretic [Lisinopril-Hydrochlorothiazide]     Swelling     Patient Measurements: Height: _0  (175.3 cm) Weight: 85.4 kg (188 lb 4.4 oz) IBW/kg (Calculated) : 66.2 Heparin Dosing Weight: 74.6 kg  Vital Signs: Temp: 97.4 F (36.3 C) (10/25 0400) Temp Source: Oral (10/25 0400) BP: 118/70 (10/25 0700) Pulse Rate: 97 (10/25 0700)  Labs: Recent Labs    09/17/21 0420 09/18/21 0228 09/19/21 0513  HGB 7.8* 7.2* 8.0*  HCT 27.6* 25.1* 27.3*  PLT 131* 159 131*  HEPARINUNFRC 0.55 0.38 0.59  CREATININE 0.51 0.55 0.60     Estimated Creatinine Clearance: 84 mL/min (by C-G formula based on SCr of 0.6 mg/dL).    Medications: Infusions:   sodium chloride 250 mL (09/16/21 1617)   sodium chloride Stopped (09/18/21 0523)   acyclovir (ZOVIRAX) [phone] mg IVPB Stopped (09/19/21 0606)   amiodarone 30 mg/hr (09/18/21 2001)   heparin 1,400 Units/hr (09/18/21 2315)    Assessment: 65 yo female with new atrial fibrillation/flutter.  No history of anticoagulation PTA and none received this admit.  Patient case discussed with cardiology who recommended starting patient on heparin IV.  Baseline aPTT wnl at 32 seconds and INR 1.1.  Patient is pancytopenic likely due to chemo/radiation therapy for lung adenocarcinoma.  Pharmacy consulted to dose heparin.    Today, 09/19/21 Heparin level 0.59, therapeutic on 1400 units/hr -Hgb low/increased to 8, Plt 131k -No s/sx bleeding reported  Goal of Therapy:  Heparin level 0.3-0.7 units/ml Monitor platelets by anticoagulation protocol: Yes   Plan:  Continue heparin infusion at 1400 untis/hr Daily HL/CBC Monitor for s/sx bleeding F/u long-term plans, ability to transition to oral anticoagulant   Gretta Arab PharmD, BCPS Clinical  Pharmacist [facility] main pharmacy [phone] 09/19/2021 7:43 AM

## 2021-09-19 NOTE — Progress Notes (Signed)
Daily Progress Note   Patient Name: Patricia Sampson       Date: 09/19/2021 DOB: 15-Mar-1956  Age: 65 y.o. MRN#: [id number] Attending Physician: Flora Lipps, MD Primary Care Physician: Greig Right, MD Admit Date: 08/26/2021  Reason for Consultation/Follow-up: Establishing goals of care  Subjective: I saw and examined Patricia Sampson and met with her sister at the bedside.  Patricia Sampson is now on O2 via [place], she is awake alert, off the BIPAP today. Denies pain, states she did not like the BIPAP and that it was suffocating. Is tolerating PO well.     Length of Stay: 8  Current Medications: Scheduled Meds:  . amiodarone  200 mg Oral Daily  . chlorhexidine  15 mL Mouth Rinse BID  . Chlorhexidine Gluconate Cloth  6 each Topical Daily  . feeding supplement  237 mL Oral TID BM  . furosemide  60 mg Intravenous BID  . ipratropium-albuterol  3 mL Nebulization Q4H  . mouth rinse  15 mL Mouth Rinse q12n4p  . methylPREDNISolone (SOLU-MEDROL) injection  40 mg Intravenous Q24H  . sodium chloride flush  10-40 mL Intracatheter Q12H  . vitamin B-12  1,000 mcg Oral Daily    Continuous Infusions: . sodium chloride 250 mL (09/16/21 1617)  . sodium chloride Stopped (09/18/21 0523)  . acyclovir (ZOVIRAX) [phone] mg IVPB Stopped (09/19/21 0606)  . heparin 1,400 Units/hr (09/19/21 1200)    PRN Meds: sodium chloride, acetaminophen **OR** acetaminophen, fentaNYL (SUBLIMAZE) injection, ipratropium-albuterol, LORazepam, ondansetron **OR** ondansetron (ZOFRAN) IV, sodium chloride flush  Physical Exam         General: awake alert oriented, converses appropriately.  HEENT: now on O2 [place].  heart: Regular rate and rhythm. No murmur appreciated. Lungs: Diminished air movement, clear Abdomen: Soft,  nontender, nondistended, positive bowel sounds.   Ext: No significant edema Skin: Warm and dry  Vital Signs: BP 109/68   Pulse 100   Temp 97.8 F (36.6 C) (Oral)   Resp 16   Ht _0  (1.753 m)   Wt 85.4 kg   SpO2 90%   BMI 27.80 kg/m  SpO2: SpO2: 90 % O2 Device: O2 Device: High Flow Nasal Cannula O2 Flow Rate: O2 Flow Rate (L/min): 10 L/min  Intake/output summary:  Intake/Output Summary (Last 24 hours) at 09/19/2021 1256 Last data filed at 09/19/2021  1200 Gross per 24 hour  Intake 1391.8 ml  Output 1200 ml  Net 191.8 ml    LBM: Last BM Date: 09/18/21 Baseline Weight: Weight: 75 kg Most recent weight: Weight: 85.4 kg       Palliative Assessment/Data:    Flowsheet Rows    Flowsheet Row Most Recent Value  Intake Tab   Referral Department Hospitalist  Unit at Time of Referral ICU  Palliative Care Primary Diagnosis Sepsis/Infectious Disease  Date Notified 09/15/21  Palliative Care Type New Palliative care  Reason for referral Clarify Goals of Care  Date of Admission 09/05/2021  Date first seen by Palliative Care 09/16/21  # of days Palliative referral response time 1 Day(s)  # of days IP prior to Palliative referral 4  Clinical Assessment   Palliative Performance Scale Score 40%  Psychosocial & Spiritual Assessment   Palliative Care Outcomes        Patient Active Problem List   Diagnosis Date Noted  . Acute on chronic respiratory failure with hypoxia and hypercapnia (HCC)   . PAF (paroxysmal atrial fibrillation) ([place])   . Altered mental status   . SIRS (systemic inflammatory response syndrome) ([place]) 09/09/2021  . Microcytic anemia 08/21/2021  . Adenocarcinoma of left lung, stage 3 ([place]) 07/19/2021  . Encounter for antineoplastic chemotherapy 07/19/2021  . Cancer associated pain 07/19/2021  . Pneumonia due to COVID-19 virus 11/25/2019  . Shortness of breath 11/18/2019  . Chronic respiratory failure with hypoxia ([place]) 11/18/2019  . COPD (chronic  obstructive pulmonary disease) ([place]) 01/27/2014  . Hypoxemia 01/27/2014  . Secondary pulmonary arterial hypertension ([place]) 01/27/2014    Palliative Care Assessment & Plan   Patient Profile: 65 y.o. female  with past medical history of severe COPD, chronic hypoxic respiratory failure on 4-8 L, hypertension, hyperlipidemia with recent diagnosis of stage III adenocarcinoma with large left upper lobe mass and chest wall invasion in August of this year who is currently undergoing chemoradiation admitted on 09/21/2021 with altered mental status with concern for sepsis.  Her mental status over the past several days has remained poor and far from her baseline.  Palliative consulted for goals of care.  Recommendations/Plan: Full code/full scope Continue current mode of care.     Goals of Care and Additional Recommendations: Limitations on Scope of Treatment: Full Scope Treatment  Code Status:    Code Status Orders  (From admission, onward)           Start     Ordered   08/26/2021 2128  Full code  Continuous        09/23/2021 2127           Code Status History     This patient has a current code status but no historical code status.       Prognosis:  Unable to determine  Discharge Planning: To Be Determined  Care plan was discussed with patient and sister   Thank you for allowing the Palliative Medicine Team to assist in the care of this patient.   Total Time 15 Prolonged Time Billed No   Greater than 50%  of this time was spent counseling and coordinating care related to the above assessment and plan.   Patricia Chance, MD  Please contact Palliative Medicine Team phone at [phone] for questions and concerns.

## 2021-09-19 NOTE — Progress Notes (Signed)
NAME:  Patricia Sampson, MRN:  [id number], DOB:  February 07, 1956, LOS: 8 ADMISSION DATE:  09/02/2021, CONSULTATION DATE: 09/22/2021 REFERRING MD: Dr. Marylyn Ishihara, CHIEF COMPLAINT: Encephalopathy  BRIEF  65 year old woman with very severe COPD, associated chronic hypoxemic respiratory failure (4-8 L/min), hypertension, hyperlipidemia.  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 She was noted to have altered mental status on the morning of 10/17, appeared to be normal when she went to bed the night before.  In the emergency department she was tachycardic, tachypneic with pancytopenia.  Last chemotherapy infusion appears to have been 09/05/2021.  Acute metabolic encephalopathy.  She was treated empirically for possible sepsis, source unknown. Home med list with Percocet, Neurontin.  Her husband notes that he does not believe she is been taking any of her medicines for the last 2 to 3 days.   Pertinent  Medical History   Past Medical History:  Diagnosis Date   Angioedema    COPD (chronic obstructive pulmonary disease) (HCC)    FHx: migraine headaches    Hyperlipidemia    Hypertension    Seasonal allergies     Significant Hospital Events: Including procedures, antibiotic start and stop dates in addition to other pertinent events   CT abdomen pelvis 10/17 >> no acute findings Head CT 10/17 >> normal CT-PA chest 10/17 >> no new infiltrates or significant change in the left upper lobe mass.  No PE MRI brain 10/17 >> Motion degradation compromise interpretation, no acute or subacute infarct, no mass lesion  Progressive left upper lobe primary bronchogenic carcinoma with increased destruction of the anterior left second rib. New perilymphatic nodularity in the left upper lobe adjacent to the mass, consistent with lymphangitic carcinomatosis. 3. 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  EEG 10/18 with  generalized slowing, no evidence of active seizures or epileptiform activity 10/18 acyclovir and ampicillin added to cover possible meningitis 10/19 SVT, likely A. fib/flutter noted after adenosine given.  Hypotension with diltiazem, then started amiodarone.  Back in sinus tachycardia 10/20 - SVT with what looks like A. fib/flutter after adenosine given early evening 10/19.  No real response to diltiazem infusion, then given amiodarone.  Back in sinus tachycardia, . \atient continued to be aphasic and to have agitation through the day 10/19.  Now off Precedex. I/O positive 8.7 L total. Has had hypokalemia, replaced. WBC increasing, 2.9 this morning 10/20  10/21 -husband and son at the bedside.  They tell me that after she had COVID in 2020 she has been on 6 L oxygen.  For the last 3 months before admissions she has had 35 pound weight loss approximately.  August 2022 was diagnosed with cancers in September 2022 was on chemoradiation.  Since chemoradiation there is more failure to thrive with progressive decrease in activity particularly after chemoradiation cycles. Currently on 10 L nasal cannula pulse ox 95%.  Has not been on BiPAP.  Has not been on the ventilator.  Is not on pressors.  Not on Precedex  10/22 -had severe worsening of respiratory acidosis acute on chronic with a pH of 7.22 and a PCO2 of 90 associated with worsening bilateral lower lobe atelectasis [has left upper lobe cancer] on chest x-ray compared to 08/29/2021.  Treated with BiPAP.  Blood gas subsequent improvement but did not tolerate BiPAP.  Nonetheless mental status today better.  Husband also acknowledges that she is looking better.  Cachectic elderly female.  Significant physical deconditioning .  currently on 6 L nasal cannula pulse ox 95%. Continues on IV heparin infusion and amio infusion.  On acyclovir and ampicillin and ceftriaxone for potential meningitis.  Afebrile since 09/13/2021 but leukopenic with a white count of 2.4K.   Culture negative so far Pall care consult - full code + but family meeting being arranged   Interim History / Subjective:  She is feeling improved today- less SOB.  Objective   Blood pressure 122/62, pulse (!) 101, temperature (!) 97.4 F (36.3 C), temperature source Oral, resp. rate (!) 28, height _0  (1.753 m), weight 85.4 kg, SpO2 93 %.    FiO2 (%):  [50 %-60 %] 50 % Estimated body mass index is 27.8 kg/m as calculated from the following:   Height as of this encounter: _1  (1.753 m).   Weight as of this encounter: 85.4 kg.   Intake/Output Summary (Last 24 hours) at 09/19/2021 7902 Last data filed at 09/19/2021 4097 Gross per 24 hour  Intake 832.83 ml  Output 1200 ml  Net -367.17 ml    Filed Weights   09/16/21 1200 09/16/21 1600 09/18/21 0500  Weight: 75 kg 75 kg 85.4 kg    Examination: General Appearance:  ill appearing woman lying in bed in NAD Head:  [place]/AT, eyes anicteric Neck: supple Lungs: rhales L>R, no wheezing, no accessory muscle use Heart:  S1S2, RRR Abdomen: soft, NT, ND Extremities:  LE edema more significant, +clubbing Skin: warm & dry Neurologic:  awake, very weak- barely able to put her hand up to her face. Able to answer in short phrases. Looks very fatigued.  Na+ 135 K+ 3.6 WBC 3 H/H 8/27.3   Resolved Hospital Problem list     Assessment & Plan:   Acute on chronic respiratory failure with hypoxia and hypercapnia- due to acute COPD exacerbation and progressive lung adenocarcinoma.  Her baseline lung function is severely impaired (FEV1 24% predicted in 2021). She has very widespread cancer throughout the left lung. -Con't frequent bronchodilators. -Con't solumedrol -Additional lasix today -BiPAP PRN; she has not been tolerating well due to agitation -Ongoing meetings with palliative care. Family is waiting to hear from oncology regarding her prognosis. I do not anticipate that progressing to intubation would improve her chances of  surviving her chronic, progressive lung diseases. -Radiation today.  Acute encephalopathy with agitated delirium- improving. Able to communicate better today. -Patient's family has refused LP, but she has been on empiric treatment for meningitis and encephalitis. Appreciate ID input-- covering now with acyclovir monotherapy for possible viral encephalitis. - Con't supportive care. -Frequent reorientation, family visitation, avoiding deliriogenic meds where possible, but her anxiety makes it hard to avoid all benzo use. -Continue B12 supplements  Acute on chronic pain -Fentanyl as needed  SVT, paroxysmal AF - Appreciate cardiology's management.  Continue amiodarone and heparin infusion, but may be reasonable to come off amiodarone and monitor given resolution of some of her uncontrolled symptoms previously. - Telemetry monitoring  Adenocarcinoma of the lung, on chemoradiation- Stage IIIA (T4, N1, M0) non-small cell lung cancer, adenocarcinoma presented with large left upper lobe lung mass with chest wall invasion in addition to left suprahilar lymphadenopathy diagnosed in August 2022.  Despite treatment she has had progression with rib destruction  from bony metastesis  -Appreciate Palliative Care team's ongoing assistance.  Chronic anemia, stable Acute thrombocytopenia Acute leukopenia -Monitor -Transfuse for hemoglobin less than 7 or hemodynamically significant bleeding  Hyponatremia, likely hypervolemic- resolved Hypokalemia, resolved Mild hypomagnesmia, resolved -monitor  Failure to thrive  with > 30# weight  loss PTA Cancer pain on chronic opioid Severe Protein calorie malnutrition  -Palliative care consult - Adding Ensure 3 times daily  Although her symptoms are improved today, I think her frailty and advanced lung disease and cancer continue to make her prognosis poor.   Best Practice (right click and "Reselect all SmartList Selections" daily)   Diet/type: Regular  consistency (see orders) DVT prophylaxis: other GI prophylaxis: N/A Lines: N/A Foley:  N/A Code Status:  full code Last date of multidisciplinary goals of care discussion [10/17. Discussed GOC briefly with pt's husband 10/17 -- need further discussions and would like to include pt, her son. She is full code at this time.]  Family: Updated son at bedside on 10/20 and 09/15/21 and husband 09/16/2021. Husband and patient 09/17/21 at bedside - DNR/DNI recommended/   Dispo:  Appreciate pall care consult   LABS       PULMONARY Recent Labs  Lab 09/15/21 1225 09/15/21 1700 09/17/21 1000  PHART 7.227* 7.348* 7.315*  PCO2ART 90.6* 67.9* 81.5*  PO2ART 97.5 51.8* 56.1*  HCO3 36.3* 36.4* 40.3*  O2SAT 97.3 85.8 87.8     CBC Recent Labs  Lab 09/17/21 0420 09/18/21 0228 09/19/21 0513  HGB 7.8* 7.2* 8.0*  HCT 27.6* 25.1* 27.3*  WBC 2.7* 2.5* 3.0*  PLT 131* 159 131*     COAGULATION No results for input(s): INR in the last 168 hours.   CARDIAC  No results for input(s): TROPONINI in the last 168 hours. No results for input(s): PROBNP in the last 168 hours.   CHEMISTRY Recent Labs  Lab 09/15/21 0255 09/15/21 1443 09/16/21 0259 09/17/21 0420 09/18/21 0228 09/19/21 0513  NA 136 134* 134* 132* 134* 135  K 3.0* 3.3* 3.1* 3.7 3.3* 3.6  CL 93* 92* 91* 89* 88* 83*  CO2 33* 36* 37* 35* 40* 46*  GLUCOSE 118* 125* 116* 105* 91 118*  BUN 10 8 7* 6* <5* 5*  CREATININE 0.62 0.55 0.55 0.51 0.55 0.60  CALCIUM 7.8* 8.2* 8.2* 8.4* 8.2* 8.3*  MG 1.7  --  1.9 1.9 2.0 1.5*  PHOS 2.8  --   --  3.0 2.5  --     Estimated Creatinine Clearance: 84 mL/min (by C-G formula based on SCr of 0.6 mg/dL).   LIVER Recent Labs  Lab 09/14/21 0004 09/17/21 0420 09/18/21 0228  AST 14* 10* 11*  ALT _0 ALKPHOS 62 71 68  BILITOT 0.5 0.4 0.3  PROT 5.5* 6.1* 5.8*  ALBUMIN 2.2* 2.1* 2.0*       Julian Hy, DO 09/19/21 10:15 AM [facility]

## 2021-09-19 NOTE — Progress Notes (Signed)
PROGRESS NOTE  Patricia Sampson UYQ:[id number] DOB: 1956/03/03 DOA: 09/04/2021 PCP: Greig Right, MD   LOS: 8 days   Brief narrative: Patricia Sampson is a 65 y.o. 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  In the ED, patient was tachypneic and tachycardic and had pancytopenia.  Last chemotherapy was 09/05/2021.  Chest x-ray and CT head scan was negative.  Patient was started on broad-spectrum antibiotics for possible sepsis and patient was admitted to the hospital.    Assessment/Plan:  Principal Problem:   SIRS (systemic inflammatory response syndrome) (HCC) Active Problems:   COPD (chronic obstructive pulmonary disease) (HCC)   Chronic respiratory failure with hypoxia (HCC)   Altered mental status   PAF (paroxysmal atrial fibrillation) (HCC)   Acute on chronic respiratory failure with hypoxia and hypercapnia (HCC)  Acute metabolic encephalopathy.   Improved.  Patient was encouraged to use BiPAP at nighttime and during napping.  She used for at least 4 hours yesterday.  Empirically on antibiotic for possible meningitis initially.  ID and neurology was consulted.  Blood culture, urine culture negative so far.  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  Patient has refused LP.  ID has followed the patient and at this time antibiotics continued.  IV acyclovir has been continued for possible viral encephalitis.  Low vitamin B-12.  Goal more than 400.  Continue vitamin B12  Paroxysmal A. fib with RVR.  Cardiology on board.  On heparin and amiodarone drip.  CHA2DS2-VASc score of 2.  Pancytopenia noted.  Risk of bleeding from anticoagulation in the long-term.  Rate controlled  at this time.  TSH within normal limits.  2D echocardiogram from 09/14/2021 showed LV ejection fraction of 55 to 60% with grade 1 diastolic dysfunction.  Hypokalemia.  Improved after replacement.  Check BMP in AM.  Acute on chronic hypoxic respiratory failure.  Patient has severe COPD and 4 to 6 L of oxygen at baseline.  Patient required nonrebreather mask on admission.  Currently on 10 L of oxygen by nasal cannula PCCM on board.  PCCM recommends BiPAP at nighttime.  BNP of 80.3.  Continue IV Solu-Medrol, nebulizers,  Adenocarcinoma of the lung on chemoradiation.  Stage IIIa with chest wall invasion.  With pancytopenia on presentation.  Follows up with Dr. Theda Belfast as outpatient.  Palliative care on board.    Pancytopenia.  Mild, latest hemoglobin of 8.0 and WBC at 3.0.  Platelet at 131.  Difficult IV access.  Status post PICC line placement.  Mild hypomagnesia.  We will continue to replace.  Check levels in a.m.  Essential hypertension.  Monitor loosely.  Ethics/goals of care patient with multiple comorbidities and lung cancer.  Overall prognosis is guarded.  Very likely to have adverse outcome if CPR or intubation is planned.  Palliative care on board.  Palliative care to have a meeting with the family.  DVT prophylaxis: SCDs Start: 08/28/2021 2128  Code Status: Full code  Family Communication:  I again spoke with the patient's sister at bedside.  Status is: Inpatient  Remains inpatient appropriate because:  on multiple IV drips and IV antibiotics, respiratory failure, goals of care discussion  Consultants: PCCM Infectious disease Neurology Palliative care  Procedures: EEG BiPAP placement  Anti-infectives:  Acyclovir Ampicillin Rocephin  Subjective:  Today, patient was seen and examined at bedside.  Patient denies overt pain.  Seen after radiation treatment.  Denies any nausea vomiting fever chills  Objective: Vitals:   09/19/21 1200 09/19/21 1208  BP: 109/68    Pulse: 100   Resp: 16   Temp:  97.8 F (36.6 C)  SpO2: 90%     Intake/Output Summary (Last 24 hours) at 09/19/2021 1210 Last data filed at 09/19/2021 1200 Gross per 24 hour  Intake 1391.8 ml  Output 1200 ml  Net 191.8 ml    Filed Weights   09/16/21 1200 09/16/21 1600 09/18/21 0500  Weight: 75 kg 75 kg 85.4 kg   Body mass index is 27.8 kg/m.   Physical Exam: General:  Average built, not in obvious distress, on nasal cannula oxygen, slow to respond, HENT:   No scleral pallor or icterus noted. Oral mucosa is moist.  Chest:    Diminished breath sounds bilaterally.  Coarse breath sounds noted. CVS: S1 &S2 heard. No murmur.  Regular rate and rhythm. Abdomen: Soft, nontender, nondistended.  Bowel sounds are heard.   Extremities: No cyanosis, clubbing or edema.  Peripheral pulses are palpable. Psych: Alert, awake and oriented to place and person, slow to respond  CNS:  No cranial nerve deficits.  Power equal in all extremities.   Skin: Warm and dry.  No rashes noted.  Data Review: I have personally reviewed the following laboratory data and studies,  CBC: Recent Labs  Lab 09/13/21 0254 09/14/21 0004 09/15/21 0255 09/16/21 0259 09/17/21 0420 09/18/21 0228 09/19/21 0513  WBC 2.1*   < > 2.9* 2.4* 2.7* 2.5* 3.0*  NEUTROABS 1.5*  --  2.2 1.8  --   --   --   HGB 8.3*   < > 7.8* 7.4* 7.8* 7.2* 8.0*  HCT 28.4*   < > 26.4* 25.2* 27.6* 25.1* 27.3*  MCV 87.4   < > 86.0 87.5 88.5 87.2 86.9  PLT 129*   < > 137* 138* 131* 159 131*   < > = values in this interval not displayed.    Basic Metabolic Panel: Recent Labs  Lab 09/15/21 0255 09/15/21 1443 09/16/21 0259 09/17/21 0420 09/18/21 0228 09/19/21 0513  NA 136 134* 134* 132* 134* 135  K 3.0* 3.3* 3.1* 3.7 3.3* 3.6  CL 93* 92* 91* 89* 88* 83*  CO2 33* 36* 37* 35* 40* 46*  GLUCOSE 118* 125* 116* 105* 91 118*  BUN 10 8 7* 6* <5* 5*  CREATININE 0.62 0.55 0.55 0.51 0.55 0.60  CALCIUM 7.8* 8.2* 8.2* 8.4* 8.2* 8.3*  MG 1.7   --  1.9 1.9 2.0 1.5*  PHOS 2.8  --   --  3.0 2.5  --     Liver Function Tests: Recent Labs  Lab 09/14/21 0004 09/17/21 0420 09/18/21 0228  AST 14* 10* 11*  ALT _0 ALKPHOS 62 71 68  BILITOT 0.5 0.4 0.3  PROT 5.5* 6.1* 5.8*  ALBUMIN 2.2* 2.1* 2.0*    No results for input(s): LIPASE, AMYLASE in the last 168 hours. No results for input(s): AMMONIA in the last 168 hours.  Cardiac Enzymes: No results for input(s): CKTOTAL, CKMB, CKMBINDEX, TROPONINI in the last 168 hours. BNP (last 3 results) Recent Labs    09/18/21 0228  BNP 80.3    ProBNP (last 3 results) No results for input(s): PROBNP in the last 8760 hours.  CBG: No results for input(s): GLUCAP in the last 168  hours. Recent Results (from the past 240 hour(s))  Blood Culture (routine x 2)     Status: None   Collection Time: 09/23/2021 11:55 AM   Specimen: BLOOD RIGHT HAND  Result Value Ref Range Status   Specimen Description   Final    BLOOD RIGHT HAND Performed at [facility] [address], [place], [place] [address]    Special Requests   Final    BOTTLES DRAWN AEROBIC AND ANAEROBIC Blood Culture adequate volume Performed at [facility] [address], [place], [place] [address]    Culture   Final    NO GROWTH 5 DAYS Performed at [facility], [place] [address] [place], [place] [address]    Report Status 09/16/2021 FINAL  Final  Blood Culture (routine x 2)     Status: None   Collection Time: 09/12/2021 11:55 AM   Specimen: BLOOD  Result Value Ref Range Status   Specimen Description   Final    BLOOD LEFT ANTECUBITAL Performed at [facility] [address], [place], [place] [address]    Special Requests   Final    BOTTLES DRAWN AEROBIC AND ANAEROBIC Blood Culture results may not be optimal due to an excessive volume of blood received in culture bottles Performed at [facility] [address], [place], [place]  [address]    Culture   Final    NO GROWTH 5 DAYS Performed at [facility], [place] [address] [place], [place] [address]    Report Status 09/16/2021 FINAL  Final  Resp Panel by RT-PCR (Flu A&B, Covid) Nasopharyngeal Swab     Status: None   Collection Time: 08/29/2021  1:50 PM   Specimen: Nasopharyngeal Swab; Nasopharyngeal(NP) swabs in vial transport medium  Result Value Ref Range Status   SARS Coronavirus 2 by RT PCR NEGATIVE NEGATIVE Final    Comment: (NOTE) SARS-CoV-2 target nucleic acids are NOT DETECTED.  The SARS-CoV-2 RNA is generally detectable in upper respiratory specimens during the acute phase of infection. The lowest concentration of SARS-CoV-2 viral copies this assay can detect is 138 copies/mL. A negative result does not preclude SARS-Cov-2 infection and should not be used as the sole basis for treatment or other patient management decisions. A negative result may occur with  improper specimen collection/handling, submission of specimen other than nasopharyngeal swab, presence of viral mutation(s) within the areas targeted by this assay, and inadequate number of viral copies(<138 copies/mL). A negative result must be combined with clinical observations, patient history, and epidemiological information. The expected result is Negative.  Fact Sheet for Patients:  EntrepreneurPulse.com.au  Fact Sheet for Healthcare Providers:  IncredibleEmployment.be  This test is no t yet approved or cleared by the [place] FDA and  has been authorized for detection and/or diagnosis of SARS-CoV-2 by FDA under an Emergency Use Authorization (EUA). This EUA will remain  in effect (meaning this test can be used) for the duration of the COVID-19 declaration under Section 564(b)(1) of the Act, 21 U.S.C.section 360bbb-3(b)(1), unless the authorization is terminated  or revoked sooner.       Influenza A by PCR NEGATIVE NEGATIVE Final   Influenza  B by PCR NEGATIVE NEGATIVE Final    Comment: (NOTE) The Xpert Xpress SARS-CoV-2/FLU/RSV plus assay is intended as an aid in the diagnosis of influenza from Nasopharyngeal swab specimens and should not be used as a sole basis for treatment. Nasal washings and aspirates are unacceptable for Xpert  Xpress SARS-CoV-2/FLU/RSV testing.  Fact Sheet for Patients: EntrepreneurPulse.com.au  Fact Sheet for Healthcare Providers: IncredibleEmployment.be  This test is not yet approved or cleared by the [place] FDA and has been authorized for detection and/or diagnosis of SARS-CoV-2 by FDA under an Emergency Use Authorization (EUA). This EUA will remain in effect (meaning this test can be used) for the duration of the COVID-19 declaration under Section 564(b)(1) of the Act, 21 U.S.C. section 360bbb-3(b)(1), unless the authorization is terminated or revoked.  Performed at [facility], [place] [address], [place], [place] [address]   Urine Culture     Status: None   Collection Time: 09/05/2021  2:57 PM   Specimen: Urine, Random  Result Value Ref Range Status   Specimen Description   Final    URINE, RANDOM Performed at [facility] [address] [place], [place] [address]    Special Requests   Final    NONE Performed at [facility], [place] [address] [place], [place] [address]    Culture   Final    NO GROWTH Performed at [facility], [place] [address], [place], [place] [address]    Report Status 09/12/2021 FINAL  Final  MRSA Next Gen by PCR, Nasal     Status: None   Collection Time: 08/30/2021  9:13 PM   Specimen: Nasal Mucosa; Nasal Swab  Result Value Ref Range Status   MRSA by PCR Next Gen NOT DETECTED NOT DETECTED Final    Comment: (NOTE) The GeneXpert MRSA Assay (FDA approved for NASAL specimens only), is one component of a comprehensive MRSA colonization surveillance program. It is not  intended to diagnose MRSA infection nor to guide or monitor treatment for MRSA infections. Test performance is not FDA approved in patients less than 42 years old. Performed at [facility], [place] [address] [place], [place] [address]       Studies: No results found.   Flora Lipps, MD  [facility] 09/19/2021  If 7PM-7AM, please contact night-coverage

## 2021-09-20 ENCOUNTER — Telehealth: Payer: Self-pay | Admitting: Radiation Oncology

## 2021-09-20 ENCOUNTER — Ambulatory Visit
Admission: RE | Admit: 2021-09-20 | Discharge: 2021-09-20 | Disposition: A | Discharge status: Home / Self Care | Payer: 59 | Source: Ambulatory Visit | Attending: Radiation Oncology | Admitting: Radiation Oncology

## 2021-09-20 DIAGNOSIS — J441 Chronic obstructive pulmonary disease with (acute) exacerbation: Secondary | ICD-10-CM | POA: Diagnosis not present

## 2021-09-20 DIAGNOSIS — R6510 Systemic inflammatory response syndrome (SIRS) of non-infectious origin without acute organ dysfunction: Secondary | ICD-10-CM | POA: Diagnosis not present

## 2021-09-20 DIAGNOSIS — G9341 Metabolic encephalopathy: Secondary | ICD-10-CM

## 2021-09-20 DIAGNOSIS — C3492 Malignant neoplasm of unspecified part of left bronchus or lung: Secondary | ICD-10-CM | POA: Diagnosis not present

## 2021-09-20 DIAGNOSIS — J9621 Acute and chronic respiratory failure with hypoxia: Secondary | ICD-10-CM | POA: Diagnosis not present

## 2021-09-20 DIAGNOSIS — J9622 Acute and chronic respiratory failure with hypercapnia: Secondary | ICD-10-CM | POA: Diagnosis not present

## 2021-09-20 LAB — CBC
HCT: 24.5 % — ABNORMAL LOW (ref 36.0–46.0)
Hemoglobin: 7.3 g/dL — ABNORMAL LOW (ref 12.0–15.0)
MCH: 25.8 pg — ABNORMAL LOW (ref 26.0–34.0)
MCHC: 29.8 g/dL — ABNORMAL LOW (ref 30.0–36.0)
MCV: 86.6 fL (ref 80.0–100.0)
Platelets: 179 10*3/uL (ref 150–400)
RBC: 2.83 MIL/uL — ABNORMAL LOW (ref 3.87–5.11)
RDW: 18.5 % — ABNORMAL HIGH (ref 11.5–15.5)
WBC: 3.4 10*3/uL — ABNORMAL LOW (ref 4.0–10.5)
nRBC: 0 % (ref 0.0–0.2)

## 2021-09-20 LAB — BASIC METABOLIC PANEL
BUN: 9 mg/dL (ref 8–23)
BUN: 10 mg/dL (ref 8–23)
CO2: >45 mmol/L — ABNORMAL HIGH (ref 22–32)
CO2: >45 mmol/L — ABNORMAL HIGH (ref 22–32)
Calcium: 7.7 mg/dL — ABNORMAL LOW (ref 8.9–10.3)
Calcium: 8.6 mg/dL — ABNORMAL LOW (ref 8.9–10.3)
Chloride: 79 mmol/L — ABNORMAL LOW (ref 98–111)
Chloride: 81 mmol/L — ABNORMAL LOW (ref 98–111)
Creatinine, Ser: 0.76 mg/dL (ref 0.44–1.00)
Creatinine, Ser: 0.47 mg/dL (ref 0.44–1.00)
GFR, Estimated: >60 mL/min (ref >60)
GFR, Estimated: >60 mL/min (ref >60)
Glucose, Bld: 132 mg/dL — ABNORMAL HIGH (ref 70–99)
Glucose, Bld: 139 mg/dL — ABNORMAL HIGH (ref 70–99)
Potassium: 3 mmol/L — ABNORMAL LOW (ref 3.5–5.1)
Potassium: 3.8 mmol/L (ref 3.5–5.1)
Sodium: 133 mmol/L — ABNORMAL LOW (ref 135–145)
Sodium: 137 mmol/L (ref 135–145)

## 2021-09-20 LAB — HEPARIN LEVEL (UNFRACTIONATED): Heparin Unfractionated: 0.65 IU/mL (ref 0.30–0.70)

## 2021-09-20 LAB — MAGNESIUM
Magnesium: 1.4 mg/dL — ABNORMAL LOW (ref 1.7–2.4)
Magnesium: 2.2 mg/dL (ref 1.7–2.4)

## 2021-09-20 MED ORDER — MAGNESIUM SULFATE 4 GM/100ML IV SOLN
4.0000 g | Freq: Once | INTRAVENOUS | Status: AC
  Administered 2021-09-20: 4 g via INTRAVENOUS
  Filled 2021-09-20: qty 100

## 2021-09-20 MED ORDER — FUROSEMIDE 10 MG/ML IJ SOLN
60.0000 mg | Freq: Two times a day (BID) | INTRAMUSCULAR | Status: AC
  Administered 2021-09-20 – 2021-09-21 (×2): 60 mg via INTRAVENOUS
  Filled 2021-09-20 (×2): qty 6

## 2021-09-20 MED ORDER — POTASSIUM CHLORIDE 10 MEQ/50ML IV SOLN
10.0000 meq | INTRAVENOUS | Status: AC
  Administered 2021-09-20 (×4): 10 meq via INTRAVENOUS
  Filled 2021-09-20 (×4): qty 50

## 2021-09-20 MED ORDER — PREDNISONE 20 MG PO TABS: 40.0000 mg | ORAL_TABLET | Freq: Every day | ORAL | Status: DC

## 2021-09-20 MED ORDER — IPRATROPIUM-ALBUTEROL 0.5-2.5 (3) MG/3ML IN SOLN
3.0000 mL | Freq: Four times a day (QID) | RESPIRATORY_TRACT | Status: DC
  Administered 2021-09-20 – 2021-09-23 (×14): 3 mL via RESPIRATORY_TRACT
  Filled 2021-09-20 (×15): qty 3

## 2021-09-20 MED ORDER — HEPARIN (PORCINE) 25000 UT/250ML-% IV SOLN
1300.0000 [IU]/h | INTRAVENOUS | Status: AC
  Administered 2021-09-20: 1300 [IU]/h via INTRAVENOUS
  Filled 2021-09-20: qty 250

## 2021-09-20 MED ORDER — PREDNISONE 20 MG PO TABS
40.0000 mg | ORAL_TABLET | Freq: Every day | ORAL | Status: DC
  Administered 2021-09-20 – 2021-09-22 (×3): 40 mg via ORAL
  Filled 2021-09-20 (×3): qty 2

## 2021-09-20 MED ORDER — APIXABAN 5 MG PO TABS
5.0000 mg | ORAL_TABLET | Freq: Two times a day (BID) | ORAL | Status: DC
  Administered 2021-09-20 – 2021-09-30 (×20): 5 mg via ORAL
  Filled 2021-09-20 (×21): qty 1

## 2021-09-20 MED ORDER — POTASSIUM CHLORIDE CRYS ER 20 MEQ PO TBCR
20.0000 meq | EXTENDED_RELEASE_TABLET | ORAL | Status: AC
  Administered 2021-09-20 (×2): 20 meq via ORAL
  Filled 2021-09-20 (×2): qty 1

## 2021-09-20 NOTE — Progress Notes (Signed)
[facility] ADULT ICU REPLACEMENT PROTOCOL   The patient does apply for the [facility] Adult ICU Electrolyte Replacment Protocol based on the criteria listed below:   1.Exclusion criteria: TCTS patients, ECMO patients, and Dialysis patients 2. Is GFR >/= 30 ml/min? Yes.    Patient's GFR today is >60 3. Is SCr </= 2? Yes.   Patient's SCr is 0.76 mg/dL 4. Did SCr increase >/= 0.5 in 24 hours? No. 5.Pt's weight >40kg  Yes.   6. Abnormal electrolyte(s): K 3.0, mag 1.4  7. Electrolytes replaced per protocol 8.  Call MD STAT for K+ </= 2.5, Phos </= 1, or Mag </= 1 Physician:    Ronda Fairly A 09/20/2021 3:19 AM

## 2021-09-20 NOTE — Progress Notes (Signed)
NAME:  Patricia Sampson, MRN:  [id number], DOB:  04/28/1956, LOS: 9 ADMISSION DATE:  08/26/2021, CONSULTATION DATE: 09/01/2021 REFERRING MD: Dr. Marylyn Ishihara, CHIEF COMPLAINT: Encephalopathy  BRIEF  65 year old woman with very severe COPD, associated chronic hypoxemic respiratory failure (4-8 L/min), hypertension, hyperlipidemia.  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 She was noted to have altered mental status on the morning of 10/17, appeared to be normal when she went to bed the night before.  In the emergency department she was tachycardic, tachypneic with pancytopenia.  Last chemotherapy infusion appears to have been 09/05/2021.  Acute metabolic encephalopathy.  She was treated empirically for possible sepsis, source unknown. Home med list with Percocet, Neurontin.  Her husband notes that he does not believe she is been taking any of her medicines for the last 2 to 3 days.   Pertinent  Medical History   Past Medical History:  Diagnosis Date   Angioedema    COPD (chronic obstructive pulmonary disease) (HCC)    FHx: migraine headaches    Hyperlipidemia    Hypertension    Seasonal allergies     Significant Hospital Events: Including procedures, antibiotic start and stop dates in addition to other pertinent events   CT abdomen pelvis 10/17 >> no acute findings Head CT 10/17 >> normal CT-PA chest 10/17 >> no new infiltrates or significant change in the left upper lobe mass.  No PE MRI brain 10/17 >> Motion degradation compromise interpretation, no acute or subacute infarct, no mass lesion  Progressive left upper lobe primary bronchogenic carcinoma with increased destruction of the anterior left second rib. New perilymphatic nodularity in the left upper lobe adjacent to the mass, consistent with lymphangitic carcinomatosis. 3. 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  EEG 10/18 with  generalized slowing, no evidence of active seizures or epileptiform activity 10/18 acyclovir and ampicillin added to cover possible meningitis 10/19 SVT, likely A. fib/flutter noted after adenosine given.  Hypotension with diltiazem, then started amiodarone.  Back in sinus tachycardia 10/20 - SVT with what looks like A. fib/flutter after adenosine given early evening 10/19.  No real response to diltiazem infusion, then given amiodarone.  Back in sinus tachycardia, . \atient continued to be aphasic and to have agitation through the day 10/19.  Now off Precedex. I/O positive 8.7 L total. Has had hypokalemia, replaced. WBC increasing, 2.9 this morning 10/20  10/21 -husband and son at the bedside.  They tell me that after she had COVID in 2020 she has been on 6 L oxygen.  For the last 3 months before admissions she has had 35 pound weight loss approximately.  August 2022 was diagnosed with cancers in September 2022 was on chemoradiation.  Since chemoradiation there is more failure to thrive with progressive decrease in activity particularly after chemoradiation cycles. Currently on 10 L nasal cannula pulse ox 95%.  Has not been on BiPAP.  Has not been on the ventilator.  Is not on pressors.  Not on Precedex  10/22 -had severe worsening of respiratory acidosis acute on chronic with a pH of 7.22 and a PCO2 of 90 associated with worsening bilateral lower lobe atelectasis [has left upper lobe cancer] on chest x-ray compared to 09/08/2021.  Treated with BiPAP.  Blood gas subsequent improvement but did not tolerate BiPAP.  Nonetheless mental status today better.  Husband also acknowledges that she is looking better.  Cachectic elderly female.  Significant physical deconditioning .  currently on 6 L nasal cannula pulse ox 95%. Continues on IV heparin infusion and amio infusion.  On acyclovir and ampicillin and ceftriaxone for potential meningitis.  Afebrile since 09/13/2021 but leukopenic with a white count of 2.4K.   Culture negative so far Pall care consult - full code + but family meeting being arranged   Interim History / Subjective:  She remain on high flow oxygen. She denies complaints today. Son at bedside.   Objective   Blood pressure 118/63, pulse 99, temperature 98.5 F (36.9 C), temperature source Oral, resp. rate (!) 6, height _0  (1.753 m), weight 85.4 kg, SpO2 97 %.    FiO2 (%):  [50 %] 50 % Estimated body mass index is 27.8 kg/m as calculated from the following:   Height as of this encounter: _1  (1.753 m).   Weight as of this encounter: 85.4 kg.   Intake/Output Summary (Last 24 hours) at 09/20/2021 0813 Last data filed at 09/20/2021 0654 Gross per 24 hour  Intake 1740.36 ml  Output 2550 ml  Net -809.64 ml    Filed Weights   09/16/21 1200 09/16/21 1600 09/18/21 0500  Weight: 75 kg 75 kg 85.4 kg    Examination: General Appearance: Frail, ill-appearing woman sitting up in bed no acute distress Head: [place]/AT, eyes anicteric Neck: Supple Lungs: No wheezing or accessory muscle use.  Rales in the left.  Desats easily with movement in bed. Heart: S1-S2, regular rate and rhythm Abdomen: Soft, nontender, nondistended Extremities: Clubbing present, lower extremity edema slowly improving. Skin: Warm, dry, no rashes Neurologic: Fatigued appearing, quiet voice but answering questions appropriately, globally weak.  Na+ 133 K+ 3.0 BUN 9 Cr 0.76 WBC 3.4 H/H 7.3/24.5   Resolved Hospital Problem list     Assessment & Plan:   Acute on chronic respiratory failure with hypoxia and hypercapnia- due to acute COPD exacerbation and progressive lung adenocarcinoma.  Her baseline lung function is severely impaired (FEV1 24% predicted in 2021). She has very widespread cancer throughout the left lung. - Continue bronchodilators - Can de-escalate Solu-Medrol to prednisone today-ordered - Continue diuresis-ordered - BiPAP as needed if she can tolerate -Radiation today.  Appreciate  radiation oncology's management.  Acute encephalopathy with agitated delirium- improving. Able to communicate better today. - Empiric acyclovir x14 days per ID recommendations.  Patient's family previously refused LP. -Family visitation, frequent reorientation, day- night orientation -B12 supplements  Acute on chronic pain -Fentanyl PRN  SVT, paroxysmal AF, currently NSR  - Appreciate cardiology's management.  Heparin and amiodarone continued. - Telemetry monitoring  Adenocarcinoma of the lung, on chemoradiation- Stage IIIA (T4, N1, M0) non-small cell lung cancer, adenocarcinoma presented with large left upper lobe lung mass with chest wall invasion in addition to left suprahilar lymphadenopathy diagnosed in August 2022.  Despite treatment she has had progression with rib destruction  from bony metastesis  -Appreciate Palliative Care team's ongoing assistance. Worry about her performance status and ability to tolerate therapy.  Chronic anemia, stable Acute thrombocytopenia Acute leukopenia -Monitor -Transfuse for hemoglobin less than 7 or hemodynamically significant bleeding  Hyponatremia, likely hypervolemic- resolved Hypokalemia, resolved Mild hypomagnesmia, resolved -monitor  Failure to thrive with > 30# weight  loss PTA Cancer pain on chronic opioid Severe Protein calorie malnutrition  -Palliative care consult - Adding Ensure 3 times daily  Poor prognosis given baseline severe COPD and hypoxic respiratory failure, now with spreading adenocarcinoma, now with significant deconditioning. I worry that her performance status may not ever recover to the point  that she would be a candidate for chemo with how limited her reserve is.  I discussed this with MS. Carrell and her son at bedside today and reviewed her previous PFTs and CXR. He wishes to continue aggressive care and does not want to give up. Code status not addressed today as a part of this discussion.   Best Practice  (right click and "Reselect all SmartList Selections" daily)   Diet/type: Regular consistency (see orders) DVT prophylaxis: other GI prophylaxis: N/A Lines: N/A Foley:  N/A Code Status:  full code Last date of multidisciplinary goals of care discussion [10/17. Discussed GOC briefly with pt's husband 10/17 -- need further discussions and would like to include pt, her son. She is full code at this time.]  Family: Updated son at bedside on 10/20 and 09/15/21 and husband 09/16/2021. Husband and patient 09/17/21 at bedside - DNR/DNI recommended/   Dispo:  Appreciate pall care consult   LABS       PULMONARY Recent Labs  Lab 09/15/21 1225 09/15/21 1700 09/17/21 1000  PHART 7.227* 7.348* 7.315*  PCO2ART 90.6* 67.9* 81.5*  PO2ART 97.5 51.8* 56.1*  HCO3 36.3* 36.4* 40.3*  O2SAT 97.3 85.8 87.8     CBC Recent Labs  Lab 09/18/21 0228 09/19/21 0513 09/20/21 0237  HGB 7.2* 8.0* 7.3*  HCT 25.1* 27.3* 24.5*  WBC 2.5* 3.0* 3.4*  PLT 159 131* 179     COAGULATION No results for input(s): INR in the last 168 hours.   CARDIAC  No results for input(s): TROPONINI in the last 168 hours. No results for input(s): PROBNP in the last 168 hours.   CHEMISTRY Recent Labs  Lab 09/15/21 0255 09/15/21 1443 09/16/21 0259 09/17/21 0420 09/18/21 0228 09/19/21 0513 09/20/21 0237  NA 136   < > 134* 132* 134* 135 133*  K 3.0*   < > 3.1* 3.7 3.3* 3.6 3.0*  CL 93*   < > 91* 89* 88* 83* 79*  CO2 33*   < > 37* 35* 40* 46* >45*  GLUCOSE 118*   < > 116* 105* 91 118* 132*  BUN 10   < > 7* 6* <5* 5* 9  CREATININE 0.62   < > 0.55 0.51 0.55 0.60 0.76  CALCIUM 7.8*   < > 8.2* 8.4* 8.2* 8.3* 7.7*  MG 1.7  --  1.9 1.9 2.0 1.5* 1.4*  PHOS 2.8  --   --  3.0 2.5  --   --    < > = values in this interval not displayed.    Estimated Creatinine Clearance: 84 mL/min (by C-G formula based on SCr of 0.76 mg/dL).   LIVER Recent Labs  Lab 09/14/21 0004 09/17/21 0420 09/18/21 0228  AST 14* 10*  11*  ALT _0 ALKPHOS 62 71 68  BILITOT 0.5 0.4 0.3  PROT 5.5* 6.1* 5.8*  ALBUMIN 2.2* 2.1* 2.0*       Julian Hy, DO 09/20/21 12:27 PM [facility]

## 2021-09-20 NOTE — Telephone Encounter (Signed)
I called the patient's son Patricia Sampson. He was concerned about his mother's medical status, and the discussion that has been had with the providers at the hospital.  By her CTPA that was performed on 09/04/2021 the read was that there was concern for progressive disease.  This was however in comparison to a scan from August 2022.  Dr. Lisbeth Renshaw had personally reviewed her imaging during her work-up as well as from her simulation imaging.  Fortunately her imaging in our department although its not visible in epic with diagnostic reports does show that during the course of her treatment that she is completed thus far which are 26 of the 30 planned treatments, that she has had radiographic improvement of her disease. Patricia Sampson is in agreement with her plans to continue daily radiation as long as this remains clinically appropriate including that she would be safe enough to leave the floor for treatment.  Patricia Sampson is also aware of how ill the patient is, from a medical perspective if her respiratory status progressively worsens, that may be reason to consider foregoing radiation however at this point in time it doesn't appear that we're to that point.

## 2021-09-20 NOTE — Assessment & Plan Note (Addendum)
Follows with Dr. Lisbeth Renshaw and Dr. Julien Nordmann. Patient transitioned to full comfort measures during this hospitalization.

## 2021-09-20 NOTE — Evaluation (Signed)
Physical Therapy Evaluation Patient Details Name: Patricia Sampson MRN: [id number] DOB: 06/17/1956 Today's Date: 09/20/2021  History of Present Illness  Patricia Sampson is a 65 y.o. female with PMH Stage IIIa LUL adenocarcinoma, chronic hypoxic respiratory failure 6L O2 at home, HTN, HLD who presented with AMS 08/28/2021,  Clinical Impression   The patient resting  in bed on 10 L HFNC, SPO2 955. RN increased to 15 for mobility. Patient able to mobilize to sitting  on bed edge and stood x 1 briefly with 2 assisting. SPO2 dropped to 86%, returned to 91 with rest and PLB. Patient sat on bed edge x ~ 8 minutes.  Patient has supportive family and hopes for patient to return home.Pt admitted with above diagnosis.  Pt currently with functional limitations due to the deficits listed below (see PT Problem List). Pt will benefit from skilled PT to increase their independence and safety with mobility to allow discharge to the venue listed below.           Recommendations for follow up therapy are one component of a multi-disciplinary discharge planning process, led by the attending physician.  Recommendations may be updated based on patient status, additional functional criteria and insurance authorization.  Follow Up Recommendations Home health PT depending on progress/vs SNF.     Assistance Recommended at Discharge Frequent or constant Supervision/Assistance  Functional Status Assessment Patient has had a recent decline in their functional status and/or demonstrates limited ability to make significant improvements in function in a reasonable and predictable amount of time  Equipment Recommendations  Wheelchair (measurements PT);Wheelchair cushion (measurements PT)    Recommendations for Other Services       Precautions / Restrictions Precautions Precautions: Fall Precaution Comments: on HFNC 10 L , upped to 15L      Mobility  Bed Mobility Overal bed mobility: Needs Assistance Bed Mobility:  Supine to Sit;Sit to Sidelying     Supine to sit: Mod assist;+2 for physical assistance;+2 for safety/equipment   Sit to sidelying: Mod assist;+2 for physical assistance;+2 for safety/equipment General bed mobility comments: assisted with legs and trunk    Transfers Overall transfer level: Needs assistance   Transfers: Sit to/from Stand Sit to Stand: +2 physical assistance;+2 safety/equipment;Mod assist           General transfer comment: stood,held onto beck of chair, stood x 1 minute    Ambulation/Gait                Stairs            Wheelchair Mobility    Modified Rankin (Stroke Patients Only)       Balance Overall balance assessment: Needs assistance Sitting-balance support: Feet supported;No upper extremity supported Sitting balance-Leahy Scale: Fair Sitting balance - Comments: ate applesauce sitting   Standing balance support: During functional activity;Bilateral upper extremity supported Standing balance-Leahy Scale: Poor Standing balance comment: reliant on support and steady assist                             Pertinent Vitals/Pain Pain Assessment: No/denies pain    Home Living Family/patient expects to be discharged to:: Private residence Living Arrangements: Spouse/significant other;Children;Other relatives Available Help at Discharge: Family Type of Home: House Home Access: Stairs to enter Entrance Stairs-Rails: None Entrance Stairs-Number of Steps: 3     Home Equipment: Rollator (4 wheels) Additional Comments: to get a ramp    Prior Function Prior Level of Function :  Needs assist       Physical Assist : Mobility (physical);ADLs (physical) Mobility (physical): Gait   Mobility Comments: used rollator ADLs Comments: assisted     Hand Dominance   Dominant Hand: Right    Extremity/Trunk Assessment   Upper Extremity Assessment Upper Extremity Assessment: LUE deficits/detail LUE Deficits / Details: Limited  shoulder flexion    Lower Extremity Assessment Lower Extremity Assessment: Generalized weakness    Cervical / Trunk Assessment Cervical / Trunk Assessment: Kyphotic  Communication   Communication: No difficulties  Cognition Arousal/Alertness: Awake/alert Behavior During Therapy: WFL for tasks assessed/performed Overall Cognitive Status: Within Functional Limits for tasks assessed                                          General Comments      Exercises     Assessment/Plan    PT Assessment Patient needs continued PT services  PT Problem List Decreased strength;Decreased mobility;Cardiopulmonary status limiting activity;Decreased knowledge of precautions;Decreased activity tolerance;Decreased balance       PT Treatment Interventions DME instruction;Therapeutic activities;Gait training;Therapeutic exercise;Patient/family education;Functional mobility training    PT Goals (Current goals can be found in the Care Plan section)  Acute Rehab PT Goals Patient Stated Goal: to get up PT Goal Formulation: With patient/family Time For Goal Achievement: 10/04/21 Potential to Achieve Goals: Fair    Frequency Min 3X/week   Barriers to discharge        Co-evaluation               AM-PAC PT "6 Clicks" Mobility  Outcome Measure Help needed turning from your back to your side while in a flat bed without using bedrails?: A Lot Help needed moving from lying on your back to sitting on the side of a flat bed without using bedrails?: A Lot Help needed moving to and from a bed to a chair (including a wheelchair)?: A Lot Help needed standing up from a chair using your arms (e.g., wheelchair or bedside chair)?: A Lot Help needed to walk in hospital room?: Total Help needed climbing 3-5 steps with a railing? : Total 6 Click Score: 10    End of Session Equipment Utilized During Treatment: Oxygen Activity Tolerance: Patient tolerated treatment well Patient left: in  bed;with call bell/phone within reach;with nursing/sitter in room;with family/visitor present Nurse Communication: Mobility status PT Visit Diagnosis: Unsteadiness on feet (R26.81);Muscle weakness (generalized) (M62.81)    Time: 6579-0383 PT Time Calculation (min) (ACUTE ONLY): 25 min   Charges:   PT Evaluation $PT Eval Low Complexity: 1 Low PT Treatments $Therapeutic Activity: 8-22 mins        Tresa Endo PT Acute Rehabilitation Services Pager [phone] Office [phone]   Claretha Cooper 09/20/2021, 5:38 PM

## 2021-09-20 NOTE — Hospital Course (Signed)
Patricia Sampson is a 65 y.o. female with PMH Stage IIIa LUL adenocarcinoma, chronic hypoxic respiratory failure 6L O2 at home, HTN, HLD who presented with AMS. She underwent extensive workup but ultimately an LP was declined. She was treated empirically with abx and acyclovir for encephalitis and her mentation improved.

## 2021-09-20 NOTE — Assessment & Plan Note (Addendum)
Initially resolved. Now with encephalopathy related to hypercapnia and current comfort measures status.

## 2021-09-20 NOTE — Assessment & Plan Note (Addendum)
Patient's baseline is 6 L/min oxygen via [place]. Patient weaned to home oxygen dose at rest, but still required increased oxygen rate with activity. Now comfort measures. Keep oxygen at (not above nor below) 6 L/min. Manage distress with Morphine/Ativan as needed.

## 2021-09-20 NOTE — Assessment & Plan Note (Addendum)
Patient treated with oxygen therapy, steroid taper and Duoneb nebulizer treatments. BiPAP used overnight. Now comfort measures.

## 2021-09-20 NOTE — Assessment & Plan Note (Addendum)
Likely related to SIRS/hospitalization. Cardiology consulted. Patient managed on amiodarone and heparin IV. Heparin IV transitioned to Eliquis. Now comfort measures.

## 2021-09-20 NOTE — Progress Notes (Signed)
Progress Note    Patricia Sampson   TMA:[id number]  DOB: May 18, 1956  DOA: 09/02/2021     9 Date of Service: 09/20/2021   Clinical Course Patricia Sampson is a 65 y.o. female with PMH Stage IIIa LUL adenocarcinoma, chronic hypoxic respiratory failure 6L O2 at home, HTN, HLD who presented with AMS. She underwent extensive workup but ultimately an LP was declined. She was treated empirically with abx and acyclovir for encephalitis and her mentation improved.   Assessment and Plan * SIRS (systemic inflammatory response syndrome) (HCC) - no clear source ever presented; evaluated by ID as well; LP not pursued per family wish - s/p rocephin and acyclovir - continues on monotherapy acyclovir to complete total of 14 days per ID  Acute metabolic encephalopathy-resolved as of 09/20/2021 - presented with AMS and has slowly improved during hospitalization  - now AO (name, place, year)  Acute on chronic respiratory failure with hypoxia and hypercapnia (HCC) - on 6L O2 at home via concentrator  - currently on 10L but this morning was on lower amount; requires increased flow with activity which she does at home as well (usually up to max 8L) - continue weaning O2 back down and turn up with activity as needed  Adenocarcinoma of left lung, stage 3 (HCC) Stage IIIA (T4, N1, M0) non-small cell lung cancer, adenocarcinoma presented with large left upper lobe lung mass with chest wall invasion in addition to left suprahilar lymphadenopathy diagnosed in August 2022  - follows with Dr. Lisbeth Renshaw and Dr. Julien Nordmann - palliative care also following; currently full scope of care per family and patient  - undergoing daily radiation still; chemo plan pending  - low physical reserve; concern is for lack of ability to tolerate   PAF (paroxysmal atrial fibrillation) ([place]) - followed by cardiology during hospitalization as well; likely precipitated by SIRS/infection/hospitalization  - currently on heparin and  amio - no forseeable interventions upcoming; will transition heparin drip to Eliquis at this time - rec's are for 30 day outpt event monitor and if no recurrence of afib outpatient then possibility of stopping treatment   COPD (chronic obstructive pulmonary disease) ([place]) - continue O2 and breathing treatments     Subjective:  No events overnight. Son and DIL present this morning bedside. Patient is alert and oriented to name, place, year. Had some help from son getting president but remembered Biden ultimately.  Breathing comfortably and seems to have improved since admission. We discussed ongoing PT and weaning of O2.   Objective Vitals:   09/20/21 0500 09/20/21 0600 09/20/21 0827 09/20/21 1100  BP: 122/68 118/63    Pulse: 91 99    Resp: 17 (!) 6    Temp:   (!) 97.4 F (36.3 C) (!) 97.4 F (36.3 C)  TempSrc:   Oral Oral  SpO2: 100% 97% 100%   Weight:      Height:       85.4 kg  Vital signs were reviewed and unremarkable.   Exam Physical Exam Constitutional:      General: She is not in acute distress.    Appearance: Normal appearance.  HENT:     Head: Normocephalic and atraumatic.     Mouth/Throat:     Mouth: Mucous membranes are moist.  Eyes:     Extraocular Movements: Extraocular movements intact.  Cardiovascular:     Rate and Rhythm: Normal rate and regular rhythm.     Heart sounds: Normal heart sounds.  Pulmonary:     Effort:  Pulmonary effort is normal. No respiratory distress.     Breath sounds: Normal breath sounds.  Chest:     Comments: Hyperpigmented skin noted over left chest wall and upper back, consistent with radiation changes Abdominal:     General: Bowel sounds are normal. There is no distension.     Palpations: Abdomen is soft.     Tenderness: There is no abdominal tenderness.  Musculoskeletal:        General: Normal range of motion.     Cervical back: Normal range of motion and neck supple.  Skin:    General: Skin is warm and dry.   Neurological:     General: No focal deficit present.     Mental Status: She is alert.     Labs / Other Information My review of labs, imaging, notes and other tests shows no new significant findings.    Disposition Plan: Status is: Inpatient  Remains inpatient appropriate because: IV antiviral, weaning O2    Time spent: Greater than 50% of the 35 minute visit was spent in counseling/coordination of care for the patient as laid out in the A&P.  Dwyane Dee, MD [facility] 09/20/2021, 1:54 PM

## 2021-09-20 NOTE — Progress Notes (Addendum)
ANTICOAGULATION CONSULT NOTE - follow up  Pharmacy Consult for heparin Indication: atrial fibrillation  Allergies  Allergen Reactions   Dyazide [Hydrochlorothiazide W-Triamterene]     hives   Zestoretic [Lisinopril-Hydrochlorothiazide]     Swelling     Patient Measurements: Height: _0  (175.3 cm) Weight: 85.4 kg (188 lb 4.4 oz) IBW/kg (Calculated) : 66.2 Heparin Dosing Weight: 74.6 kg  Vital Signs: Temp: 98.5 F (36.9 C) (10/26 0400) Temp Source: Oral (10/26 0400) BP: 118/63 (10/26 0600) Pulse Rate: 99 (10/26 0600)  Labs: Recent Labs    09/18/21 0228 09/19/21 0513 09/20/21 0237  HGB 7.2* 8.0* 7.3*  HCT 25.1* 27.3* 24.5*  PLT 159 131* 179  HEPARINUNFRC 0.38 0.59 0.65  CREATININE 0.55 0.60 0.76     Estimated Creatinine Clearance: 84 mL/min (by C-G formula based on SCr of 0.76 mg/dL).    Medications: Infusions:   sodium chloride 250 mL (09/16/21 1617)   sodium chloride Stopped (09/18/21 0523)   acyclovir (ZOVIRAX) [phone] mg IVPB 745 mg (09/20/21 0743)   heparin 1,400 Units/hr (09/20/21 0654)    Assessment: 65 yo female with new atrial fibrillation/flutter.  No history of anticoagulation PTA and none received this admit.  Patient case discussed with cardiology who recommended starting patient on heparin IV.  Baseline aPTT wnl at 32 seconds and INR 1.1.  Patient is pancytopenic likely due to chemo/radiation therapy for lung adenocarcinoma.  Pharmacy consulted to dose heparin.    Today, 09/20/21 - Heparin level 0.65, therapeutic and increasing on 1400 units/hr -Hgb low/decreased to 7.3, Plt 179k -No s/sx bleeding reported.    Goal of Therapy:  Heparin level 0.3-0.7 units/ml Monitor platelets by anticoagulation protocol: Yes   Plan:  Decrease to heparin infusion at 1300 untis/hr Daily HL/CBC Monitor for s/sx bleeding F/u long-term plans, ability to transition to oral anticoagulant   Gretta Arab PharmD, BCPS Clinical Pharmacist [facility] main pharmacy  [phone] 09/20/2021 7:59 AM   Addendum: Pharmacy is consulted to dose Apixaban Hgb 7.3 remains low stable, related to oncology causes, but no bleeding or complications noted.   Plan: Apixaban 5 mg PO BID First dose at 1800, stop heparin at that same time. Pharmacy to provide education prior to discharge.    Gretta Arab PharmD, BCPS Clinical Pharmacist [facility] [phone] 09/20/2021 2:04 PM

## 2021-09-20 NOTE — Assessment & Plan Note (Addendum)
No clear source ever presented; evaluated by ID as well; LP not pursued per family wish. Empirically treated with Ceftriaxone and Acyclovir. Completed course.

## 2021-09-20 NOTE — Assessment & Plan Note (Deleted)
Stage IIIA (T4, N1, M0) non-small cell lung cancer, adenocarcinoma presented with large left upper lobe lung mass with chest wall invasion in addition to left suprahilar lymphadenopathy diagnosed in August 2022

## 2021-09-21 ENCOUNTER — Ambulatory Visit
Admission: RE | Admit: 2021-09-21 | Discharge: 2021-09-21 | Disposition: A | Discharge status: Home / Self Care | Payer: 59 | Source: Ambulatory Visit | Attending: Radiation Oncology | Admitting: Radiation Oncology

## 2021-09-21 ENCOUNTER — Ambulatory Visit: Payer: 59

## 2021-09-21 DIAGNOSIS — R6510 Systemic inflammatory response syndrome (SIRS) of non-infectious origin without acute organ dysfunction: Secondary | ICD-10-CM | POA: Diagnosis not present

## 2021-09-21 DIAGNOSIS — J9622 Acute and chronic respiratory failure with hypercapnia: Secondary | ICD-10-CM | POA: Diagnosis not present

## 2021-09-21 DIAGNOSIS — C3412 Malignant neoplasm of upper lobe, left bronchus or lung: Secondary | ICD-10-CM | POA: Diagnosis not present

## 2021-09-21 DIAGNOSIS — G9341 Metabolic encephalopathy: Secondary | ICD-10-CM | POA: Diagnosis not present

## 2021-09-21 DIAGNOSIS — R5381 Other malaise: Secondary | ICD-10-CM | POA: Diagnosis not present

## 2021-09-21 DIAGNOSIS — J9621 Acute and chronic respiratory failure with hypoxia: Secondary | ICD-10-CM | POA: Diagnosis not present

## 2021-09-21 DIAGNOSIS — R64 Cachexia: Secondary | ICD-10-CM

## 2021-09-21 DIAGNOSIS — J441 Chronic obstructive pulmonary disease with (acute) exacerbation: Secondary | ICD-10-CM | POA: Diagnosis not present

## 2021-09-21 LAB — CBC
HCT: 25.8 % — ABNORMAL LOW (ref 36.0–46.0)
Hemoglobin: 7.6 g/dL — ABNORMAL LOW (ref 12.0–15.0)
MCH: 25.6 pg — ABNORMAL LOW (ref 26.0–34.0)
MCHC: 29.5 g/dL — ABNORMAL LOW (ref 30.0–36.0)
MCV: 86.9 fL (ref 80.0–100.0)
Platelets: 146 10*3/uL — ABNORMAL LOW (ref 150–400)
RBC: 2.97 MIL/uL — ABNORMAL LOW (ref 3.87–5.11)
RDW: 18.6 % — ABNORMAL HIGH (ref 11.5–15.5)
WBC: 3.6 10*3/uL — ABNORMAL LOW (ref 4.0–10.5)
nRBC: 0.6 % — ABNORMAL HIGH (ref 0.0–0.2)

## 2021-09-21 LAB — BASIC METABOLIC PANEL
BUN: 9 mg/dL (ref 8–23)
CO2: >45 mmol/L — ABNORMAL HIGH (ref 22–32)
Calcium: 8.4 mg/dL — ABNORMAL LOW (ref 8.9–10.3)
Chloride: 80 mmol/L — ABNORMAL LOW (ref 98–111)
Creatinine, Ser: 0.54 mg/dL (ref 0.44–1.00)
GFR, Estimated: >60 mL/min (ref >60)
Glucose, Bld: 92 mg/dL (ref 70–99)
Potassium: 3.7 mmol/L (ref 3.5–5.1)
Sodium: 138 mmol/L (ref 135–145)

## 2021-09-21 LAB — HEPARIN LEVEL (UNFRACTIONATED): Heparin Unfractionated: >1.1 IU/mL — ABNORMAL HIGH (ref 0.30–0.70)

## 2021-09-21 LAB — MAGNESIUM: Magnesium: 1.8 mg/dL (ref 1.7–2.4)

## 2021-09-21 MED ORDER — PRUTECT EX EMUL
Freq: Two times a day (BID) | CUTANEOUS | Status: DC
  Administered 2021-09-21 – 2021-10-07 (×6): 1 via TOPICAL
  Filled 2021-09-21 (×3): qty 45

## 2021-09-21 MED ORDER — AMMONIUM LACTATE 12 % EX LOTN
TOPICAL_LOTION | CUTANEOUS | Status: DC | PRN
  Filled 2021-09-21: qty 225

## 2021-09-21 MED ORDER — MAGNESIUM SULFATE 2 GM/50ML IV SOLN
2.0000 g | Freq: Once | INTRAVENOUS | Status: AC
  Administered 2021-09-21: 2 g via INTRAVENOUS
  Filled 2021-09-21: qty 50

## 2021-09-21 MED ORDER — MAGIC MOUTHWASH
5.0000 mL | Freq: Three times a day (TID) | ORAL | Status: DC | PRN
  Filled 2021-09-21: qty 5

## 2021-09-21 NOTE — Plan of Care (Signed)
Discussed with patient plan of care for the evening, pain management and coping with decisions of home care/ 2nd opinion with some teach back displayed.    Problem: Education: Goal: Knowledge of General Education information will improve Description: Including pain rating scale, medication(s)/side effects and non-pharmacologic comfort measures Outcome: Progressing   Problem: Health Behavior/Discharge Planning: Goal: Ability to manage health-related needs will improve Outcome: Progressing

## 2021-09-21 NOTE — Progress Notes (Addendum)
HEMATOLOGY-ONCOLOGY PROGRESS NOTE  SUBJECTIVE: Patricia Sampson is followed by Dr. Julien Nordmann for stage IIIa non-small cell lung cancer, adenocarcinoma.  She has been receiving concurrent chemoradiation with weekly carboplatin for an AUC of 2 and paclitaxel 45 mg per metered squared.  Her last chemotherapy was given on 09/04/2021.  She has remained on radiation.  She was admitted with acute on chronic respiratory failure.  At home, her baseline oxygen is around 6 L/min.  Today, she is on 10 L/min.  Nursing tells me that she desatted while taking medications this morning.  The patient has ongoing shortness of breath.  She tells me that she did get out of bed yesterday and fell that she did better than she expected.  Overall, she continues to be weak.  Oncology History  Adenocarcinoma of left lung, stage 3 ([place])  07/19/2021 Initial Diagnosis   Adenocarcinoma of left lung, stage 3 ([place])   07/19/2021 Cancer Staging   Staging form: Lung, AJCC 8th Edition - Clinical: Stage IIIA (cT4, cN1, cM0) - Signed by Curt Bears, MD on 07/19/2021    08/08/2021 -  Chemotherapy   Patient is on Treatment Plan : LUNG Carboplatin / Paclitaxel + XRT q7d      REVIEW OF SYSTEMS:   Constitutional: Denies fevers, chills  Eyes: Denies blurriness of vision Ears, nose, mouth, throat, and face: Denies mucositis or sore throat Respiratory: Reports ongoing shortness of breath Cardiovascular: Denies palpitation, chest discomfort Gastrointestinal:  Denies nausea, heartburn or change in bowel habits Skin: Denies abnormal skin rashes Lymphatics: Denies new lymphadenopathy or easy bruising Neurological:Denies numbness, tingling or new weaknesses Behavioral/Psych: Mood is stable, no new changes  Extremities: No lower extremity edema All other systems were reviewed with the patient and are negative.  I have reviewed the past medical history, past surgical history, social history and family history with the patient and they are  unchanged from previous note.   PHYSICAL EXAMINATION: ECOG PERFORMANCE STATUS: 3 - Symptomatic, >50% confined to bed  Vitals:   09/21/21 0700 09/21/21 0802  BP: (!) 143/72   Pulse: (!) 107   Resp: (!) 24   Temp:  98 F (36.7 C)  SpO2: 98% 98%   Filed Weights   09/16/21 1200 09/16/21 1600 09/18/21 0500  Weight: 75 kg 75 kg 85.4 kg    Intake/Output from previous day: 10/26 0701 - 10/27 0700 In: 664.6 [I.V.:138.1; IV Piggyback:526.5] Out: 1900 [Urine:1900]  GENERAL: Frail-appearing, no distress SKIN: skin color, texture, turgor are normal, no rashes or significant lesions EYES: normal, Conjunctiva are pink and non-injected, sclera clear OROPHARYNX:no exudate, no erythema and lips, buccal mucosa, and tongue normal  LUNGS: No wheezing HEART: regular rate & rhythm and no murmurs and no lower extremity edema ABDOMEN:abdomen soft, non-tender and normal bowel sounds NEURO: alert & oriented x 3 with fluent speech, no focal motor/sensory deficits  LABORATORY DATA:  I have reviewed the data as listed CMP Latest Ref Rng & Units 09/21/2021 09/20/2021 09/20/2021  Glucose 70 - 99 mg/dL 92 139(H) 132(H)  BUN 8 - 23 mg/dL _0 Creatinine 0.44 - 1.00 mg/dL 0.54 0.47 0.76  Sodium 135 - 145 mmol/L 138 137 133(L)  Potassium 3.5 - 5.1 mmol/L 3.7 3.8 3.0(L)  Chloride 98 - 111 mmol/L 80(L) 81(L) 79(L)  CO2 22 - 32 mmol/L >45(H) >45(H) >45(H)  Calcium 8.9 - 10.3 mg/dL 8.4(L) 8.6(L) 7.7(L)  Total Protein 6.5 - 8.1 g/dL - - -  Total Bilirubin 0.3 - 1.2 mg/dL - - -  Alkaline Phos 38 - 126 U/L - - -  AST 15 - 41 U/L - - -  ALT 0 - 44 U/L - - -    Lab Results  Component Value Date   WBC 3.6 (L) 09/21/2021   HGB 7.6 (L) 09/21/2021   HCT 25.8 (L) 09/21/2021   MCV 86.9 09/21/2021   PLT 146 (L) 09/21/2021   NEUTROABS 1.8 09/16/2021    CT Head Wo Contrast  Result Date: 09/08/2021 CLINICAL DATA:  Altered mental status EXAM: CT HEAD WITHOUT CONTRAST TECHNIQUE: Contiguous axial images  were obtained from the base of the skull through the vertex without intravenous contrast. COMPARISON:  Brain MRI 07/26/2021 FINDINGS: Brain: There is no acute intracranial hemorrhage, extra-axial fluid collection, or acute infarct. Parenchymal volume is normal.  The ventricles are stable in size. There is no mass lesion.  There is no midline shift. Vascular: No hyperdense vessel or unexpected calcification. Skull: Normal. Negative for fracture or focal lesion. Sinuses/Orbits: The imaged paranasal sinuses are clear. The globes and orbits are unremarkable. Other: None. IMPRESSION: No acute intracranial pathology. Electronically Signed   By: Valetta Mole M.D.   On: 09/08/2021 13:47   CT Angio Chest Pulmonary Embolism (PE) W or WO Contrast  Result Date: 09/20/2021 CLINICAL DATA:  Altered mental status.  History of lung cancer. EXAM: CT ANGIOGRAPHY CHEST WITH CONTRAST TECHNIQUE: Multidetector CT imaging of the chest was performed using the standard protocol during bolus administration of intravenous contrast. Multiplanar CT image reconstructions and MIPs were obtained to evaluate the vascular anatomy. CONTRAST:  72m OMNIPAQUE IOHEXOL 350 MG/ML SOLN COMPARISON:  PET-CT dated June 23, 2021. CTA chest dated April 28, 2021. FINDINGS: Cardiovascular: Satisfactory opacification of the pulmonary arteries to the segmental level. No evidence of pulmonary embolism. Unchanged enlarged central pulmonary arteries. Normal heart size. No pericardial effusion. No thoracic aortic aneurysm or dissection. Mediastinum/Nodes: Unchanged large left suprahilar lymph node measuring 3.8 x 2.4 cm (series 4, image 67), previously 3.7 x 2.3 cm. The thyroid gland, trachea, and esophagus demonstrate no significant findings. Lungs/Pleura: Enlarging anterior left upper lobe mass with similar mediastinal invasion, but progressive destruction of the anterior left second rib. New perilymphatic nodularity in the left upper lobe adjacent to the mass.  Unchanged advanced centrilobular emphysema. Trace left pleural effusion. Minimal subsegmental atelectasis in the left greater than right lower lobes. No pneumothorax. Upper Abdomen: No acute abnormality. Musculoskeletal: Progressive destruction of the anterior left second rib. Review of the MIP images confirms the above findings. IMPRESSION: 1. No evidence of pulmonary embolism. 2. 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 New perilymphatic nodularity in the left upper lobe adjacent to the mass, consistent with lymphangitic carcinomatosis. 3. 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 4. Trace left pleural effusion. Electronically Signed   By: WTitus DubinM.D.   On: 09/16/2021 18:32   MR BRAIN WO CONTRAST  Result Date: 09/17/2021 CLINICAL DATA:  Transient ischemic attack.  Altered mental status. EXAM: MRI HEAD WITHOUT CONTRAST TECHNIQUE: Multiplanar, multiecho pulse sequences of the brain and surrounding structures were obtained without intravenous contrast. COMPARISON:  Head CT earlier same day.  MRI 07/26/2021. FINDINGS: Brain: The study suffers from considerable motion degradation due to poor cooperation. Diffusion imaging does not show any acute or subacute infarction. There are minimal small vessel ischemic changes of the cerebral hemispheric white matter. No sign of large vessel infarction. No mass lesion, hemorrhage, hydrocephalus or extra-axial collection. Vascular: Major vessels  at the base of the brain show flow. Skull and upper cervical spine: Negative Sinuses/Orbits: Clear/normal Other: None IMPRESSION: Significantly motion degraded exam. No sign of acute or subacute insult. Mild chronic small-vessel change of the cerebral hemispheric white matter. Electronically Signed   By: Nelson Chimes M.D.   On: 09/02/2021 20:19   CT ABDOMEN PELVIS W CONTRAST  Result Date: 09/19/2021 CLINICAL DATA:  Altered mental  status, acute abdominal pain. EXAM: CT ABDOMEN AND PELVIS WITH CONTRAST TECHNIQUE: Multidetector CT imaging of the abdomen and pelvis was performed using the standard protocol following bolus administration of intravenous contrast. CONTRAST:  38m OMNIPAQUE IOHEXOL 350 MG/ML SOLN COMPARISON:  PET 06/23/2021 and CT abdomen pelvis 04/20/2021. FINDINGS: Lower chest: Image quality in the lung bases is degraded by respiratory motion, limiting diagnostic quality. Heart size normal. No pericardial or pleural effusion. Distal esophagus is unremarkable. Hepatobiliary: Subcentimeter low-attenuation lesion in the right hepatic lobe is too small to characterize. Liver and gallbladder are otherwise unremarkable. No biliary ductal dilatation. Pancreas: Negative. Spleen: Negative. Adrenals/Urinary Tract: Adrenal glands are unremarkable. Low-attenuation lesion in the right kidney measures 1.4 cm, too small to characterize but likely a cyst, possibly minimally complex with a peripheral calcification. Kidneys are otherwise unremarkable. Ureters are decompressed. Bilateral hip arthroplasties create streak artifact, obscuring the majority of the bladder. Stomach/Bowel: Stomach, small bowel and colon are unremarkable. Appendix is not definitely visualized. Vascular/Lymphatic: Atherosclerotic calcification of the aorta. No pathologically enlarged lymph nodes. Reproductive: Uterus is visualized.  No adnexal mass. Other: No free fluid.  Mesenteries and peritoneum are unremarkable. Musculoskeletal: Bilateral hip arthroplasties. Degenerative changes in the spine. IMPRESSION: 1. No acute findings to explain the patient's abdominal pain. 2.  Aortic atherosclerosis (ICD10-I70.0). Electronically Signed   By: MLorin PicketM.D.   On: 09/06/2021 14:06   DG CHEST PORT 1 VIEW  Result Date: 09/17/2021 CLINICAL DATA:  Stage IIIA adenocarcinoma with large left upper lobe mass and chest wall invasion, undergoing chemotherapy. EXAM: PORTABLE CHEST  1 VIEW COMPARISON:  Radiograph 09/15/2021; CT chest 08/30/2021 FINDINGS: Right upper extremity PICC tip projects at the level of the upper SVC. Nodular opacity overlying the left upper lobe, consistent with left lung mass seen on CT 09/06/2021. Nodular opacity at the left hilum corresponds to the enlarged node seen on cross-sectional imaging. Diffuse interstitial thickening, similar to 09/15/2021. Small bilateral pleural effusions and increasing patchy airspace opacities in the lung bases. No pneumothorax. Heart size is difficult to assess due to adjacent consolidations but appears stable. IMPRESSION: 1. Right upper extremity PICC tip projects at the level of the upper SVC. 2. Increasing bilateral small pleural effusions and bibasilar airspace opacities, favored to represent atelectasis, though infection is not excluded. 3. Diffuse hazy interstitial thickening is favored to reflect pulmonary edema but could also represent lymphangitic carcinomatosis. 4. No significant change in the nodular opacity in the left upper lung, corresponding to the left upper lobe mass better appreciated on prior cross-sectional imaging. Electronically Signed   By: LIleana RoupM.D.   On: 09/17/2021 11:09   DG CHEST PORT 1 VIEW  Result Date: 09/15/2021 CLINICAL DATA:  Dyspnea and respiratory abnormality EXAM: PORTABLE CHEST 1 VIEW COMPARISON:  CT chest angiography 09/07/2021 FINDINGS: The heart and mediastinal contours are within normal limits. Bibasilar patchy airspace opacities. Left upper lobe airspace opacity. Increased and coarsened interstitial markings. No pulmonary edema. Trace right pleural effusion. Possible trace left pleural effusion. No pneumothorax. No acute osseous abnormality. Degenerative changes of bilateral shoulders. IMPRESSION: 1. Interval development of  bibasilar patchy airspace opacities which may represent a combination of infection/inflammation and/or atelectasis. 2. Trace right pleural effusion. Possible  trace left pleural effusion. 3. Left upper lobe airspace opacity consistent with known primary bronchogenic carcinoma better evaluated on CT angiography chest 08/28/2021. 4.  Emphysema (ICD10-J43.9). Electronically Signed   By: Iven Finn M.D.   On: 09/15/2021 15:08   DG Chest Port 1 View  Result Date: 09/05/2021 CLINICAL DATA:  Altered mental status in a patient with history of pulmonary neoplasm. EXAM: PORTABLE CHEST 1 VIEW COMPARISON:  Comparison made with prior imaging studies from August of 2022. FINDINGS: EKG leads project over the chest. Trachea is midline. Cardiomediastinal contours and hilar structures are stable with fullness of the LEFT hilum and increased density about the LEFT upper chest compatible with known pulmonary neoplasm. No sign of lobar consolidation or visible pneumothorax. On limited assessment there is no acute skeletal process. Rib destruction of LEFT anterior ribs not as well demonstrated as on prior CT imaging. IMPRESSION: Stable findings of neoplasm in the LEFT chest. No acute cardiopulmonary disease. Electronically Signed   By: Zetta Bills M.D.   On: 08/26/2021 11:43   EEG adult  Result Date: 09/12/2021 Lora Havens, MD     09/12/2021  8:19 PM Patient Name: CHAKA BOYSON MRN: [id number] Epilepsy Attending: Lora Havens Referring Physician/Provider: Dr Cherylann Ratel Date: 09/12/2021 Duration: 26.41 mins Patient history: 65 year old female with altered mental status.  EEG to evaluate for seizure. Level of alertness: Awake AEDs during EEG study: None Technical aspects: This EEG study was done with scalp electrodes positioned according to the 10-20 International system of electrode placement. Electrical activity was acquired at a sampling rate of _0  and reviewed with a high frequency filter of _1  and a low frequency filter of _2 . EEG data were recorded continuously and digitally stored. Description: No posterior dominant rhythm was seen. EEG showed  continuous generalized sharply contoured 3 to 6 Hz theta-delta slowing, at times with triphasic morphology. Hyperventilation and photic stimulation were not performed.   ABNORMALITY - Continuous slow, generalized -Triphasic waves, generalized IMPRESSION: This study is suggestive of moderate diffuse encephalopathy, nonspecific etiology but could be secondary to toxic-metabolic causes, cefepime toxicity. No seizures or definite epileptiform discharges were seen throughout the recording. Lora Havens   ECHOCARDIOGRAM COMPLETE  Result Date: 09/14/2021    ECHOCARDIOGRAM REPORT   Patient Name:   SHAREECE BULTMAN Date of Exam: 09/14/2021 Medical Rec #:  [id number]        Height:       70.0 in Accession #:    [id number]       Weight:       164.5 lb Date of Birth:  [birth year]       BSA:          1.921 m Patient Age:    51 years         BP:           121/65 mmHg Patient Gender: F                HR:           104 bpm. Exam Location:  Inpatient Procedure: 2D Echo, Cardiac Doppler and Color Doppler Indications:    Abnormal Ekg  History:        Patient has prior history of Echocardiogram examinations, most                 recent 01/06/2017. Risk Factors:Hypertension.  Sonographer:  Helmut Muster Referring Phys: [place]  1. Left ventricular ejection fraction, by estimation, is 55 to 60%. The left ventricle has normal function. The left ventricle has no regional wall motion abnormalities. Left ventricular diastolic parameters are consistent with Grade I diastolic dysfunction (impaired relaxation).  2. There is mild respirophasic variation of interventricular septum position, possibly due to the patient's underlying lung disease. Right ventricular systolic function is normal. The right ventricular size is normal.  3. The mitral valve is normal in structure. No evidence of mitral valve regurgitation. No evidence of mitral stenosis.  4. The aortic valve is tricuspid. Aortic valve regurgitation is  not visualized. No aortic stenosis is present.  5. The inferior vena cava is dilated in size with <50% respiratory variability, suggesting right atrial pressure of 15 mmHg. FINDINGS  Left Ventricle: Left ventricular ejection fraction, by estimation, is 55 to 60%. The left ventricle has normal function. The left ventricle has no regional wall motion abnormalities. The left ventricular internal cavity size was normal in size. There is  no left ventricular hypertrophy. Left ventricular diastolic parameters are consistent with Grade I diastolic dysfunction (impaired relaxation). Right Ventricle: There is mild respirophasic variation of interventricular septum position, possibly due to the patient's underlying lung disease. The right ventricular size is normal. No increase in right ventricular wall thickness. Right ventricular systolic function is normal. Left Atrium: Left atrial size was normal in size. Right Atrium: Right atrial size was normal in size. Pericardium: There is no evidence of pericardial effusion. Mitral Valve: The mitral valve is normal in structure. No evidence of mitral valve regurgitation. No evidence of mitral valve stenosis. MV peak gradient, 4.8 mmHg. The mean mitral valve gradient is 3.0 mmHg. Tricuspid Valve: The tricuspid valve is normal in structure. Tricuspid valve regurgitation is not demonstrated. Aortic Valve: The aortic valve is tricuspid. Aortic valve regurgitation is not visualized. No aortic stenosis is present. Pulmonic Valve: The pulmonic valve was normal in structure. Pulmonic valve regurgitation is not visualized. Aorta: The aortic root is normal in size and structure. Venous: The inferior vena cava is dilated in size with less than 50% respiratory variability, suggesting right atrial pressure of 15 mmHg. IAS/Shunts: No atrial level shunt detected by color flow Doppler.  LEFT VENTRICLE PLAX 2D LVIDd:         5.00 cm     Diastology LVIDs:         3.40 cm     LV e' medial:    10.90  cm/s LV PW:         0.70 cm     LV E/e' medial:  8.5 LV IVS:        0.70 cm     LV e' lateral:   12.70 cm/s LVOT diam:     2.00 cm     LV E/e' lateral: 7.3 LV SV:         72 LV SV Index:   37 LVOT Area:     3.14 cm  LV Volumes (MOD) LV vol d, MOD A2C: 70.3 ml LV vol d, MOD A4C: 57.6 ml LV vol s, MOD A2C: 28.1 ml LV vol s, MOD A4C: 24.0 ml LV SV MOD A2C:     42.2 ml LV SV MOD A4C:     57.6 ml LV SV MOD BP:      39.6 ml RIGHT VENTRICLE             IVC RV S prime:  20.90 cm/s  IVC diam: 2.60 cm TAPSE (M-mode): 3.0 cm LEFT ATRIUM             Index        RIGHT ATRIUM           Index LA diam:        2.70 cm 1.41 cm/m   RA Area:     11.10 cm LA Vol (A2C):   40.0 ml 20.82 ml/m  RA Volume:   25.00 ml  13.01 ml/m LA Vol (A4C):   57.4 ml 29.88 ml/m LA Biplane Vol: 52.8 ml 27.49 ml/m  AORTIC VALVE LVOT Vmax:   146.00 cm/s LVOT Vmean:  89.300 cm/s LVOT VTI:    0.229 m  AORTA Ao Root diam: 3.40 cm Ao Asc diam:  3.50 cm MITRAL VALVE MV Area (PHT): 5.13 cm    SHUNTS MV Area VTI:   3.38 cm    Systemic VTI:  0.23 m MV Peak grad:  4.8 mmHg    Systemic Diam: 2.00 cm MV Mean grad:  3.0 mmHg MV Vmax:       1.09 m/s MV Vmean:      83.9 cm/s MV Decel Time: 148 msec MV E velocity: 92.40 cm/s MV A velocity: 98.50 cm/s MV E/A ratio:  0.94 Dalton McleanMD Electronically signed by Franki Monte Signature Date/Time: 09/14/2021/6:10:19 PM    Final    [place] EKG SITE RITE  Result Date: 09/15/2021 If Site Rite image not attached, placement could not be confirmed due to current cardiac rhythm.   ASSESSMENT AND PLAN: This is a pleasant 65 year old female with stage IIIa (T4, N1, N2) non-small cell lung cancer, adenocarcinoma.  She presented with a large left upper lobe lung mass with chest wall invasion in addition to left suprahilar adenopathy.  She was diagnosed August 2022.  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  PD-L1 expression is negative.  She has been receiving concurrent chemoradiation with  carboplatin for an AUC of 2 and paclitaxel 45 mg per metered squared.  She has completed 5 cycles of her chemotherapy.  Last cycle was given on 09/04/2021.  Now admitted for acute on chronic respiratory failure.  CT scan from admission has been reviewed and there is concern for possible disease progression and lymphangitic carcinomatosis.  We also discussed her case with the PCCM team.  She has severely impaired baseline lung function and is globally weak.  At this time, she is not a candidate for additional chemotherapy due to her poor performance status.  Her overall prognosis is poor related to her underlying COPD and respiratory failure.  These findings were discussed with the patient and her family today.  Given her overall prognosis, she would benefit from consideration of palliative care/hospice.  The family is still interested in pursuing additional treatment for her lung cancer.  They are considering a second opinion and we can make a referral to a tertiary care center for second opinion regarding her lung cancer when she is discharged from the hospital.   Future Appointments  Date Time Provider [place]  09/22/2021  9:15 AM [facility] LINAC 4 [facility] None  09/25/2021  9:15 AM [facility] LINAC 3 [facility] None  09/25/2021  9:30 AM [facility] LAB [facility] None  09/25/2021 10:30 AM [facility] INFUSION [facility] None  09/25/2021 10:30 AM Jennet Maduro, RD [facility] None  09/26/2021 12:15 PM [facility] LINAC 4 [facility] None  09/27/2021  9:15 AM [facility] LINAC 4 [facility] None  09/28/2021  3:00  PM [facility] LINAC 4 [facility] None  09/29/2021  3:00 PM [facility] LINAC 4 [facility] None  11/09/2021  3:10 PM Isaiah Serge, NP [facility]      LOS: 10 days   Mikey Bussing, DNP, AGPCNP-BC, AOCNP 09/21/21 ADDENDUM: Hematology/Oncology Attending: I had a face-to-face encounter with the patient today.  I reviewed her record, scans and  recommended her care plan.  I agree with the above note.  This is a very pleasant 65 years old African-American female with a stage IIIa (T4, N1, M0) small cell lung cancer, adenocarcinoma presented with large left lower lobe as well as chest wall invasion in addition to left suprahilar adenopathy diagnosed in August that showed positive KRAS G12C mutation the patient is started a course of concurrent chemoradiation with weekly carboplatin and paclitaxel.  Chemotherapy treatment. Her last dose of chemotherapy was given on September 04, 2021 when she was admitted to the hospital with worsening shortness of breath and respiratory failure.  She has been on oxygen 6 L/min at home but currently up to 10 L/min with very minimal exertion. Her recent CT scan of the chest showed concerning finding for lymphangitic spread of the tumor. I had a lengthy discussion with the patient and her family today about her current condition and treatment options. Her respiratory failure is significant and I strongly recommend for the patient and her family to consider palliative care or hospice at this point. I do not think the patient would be a great candidate for any additional systemic chemotherapy or even targeted therapy for the K-ras mutation because of her poor performance status and worsening respiratory failure.  The patient and her family are still reluctant about considering hospice but they may consider palliative care at home after discharge. Thank you for taking good care of of Mrs. Cedillos.  I will continue to follow-up the patient with you and assist in her management on as-needed basis. Disclaimer: This note was dictated with voice recognition software. Similar sounding words can inadvertently be transcribed and may be missed upon review. Eilleen Kempf, MD

## 2021-09-21 NOTE — Progress Notes (Signed)
Progress Note    Patricia Sampson   RFF:[id number]  DOB: 1956/10/14  DOA: 08/27/2021     10 Date of Service: 09/21/2021   Clinical Course Patricia Sampson is a 65 y.o. female with PMH Stage IIIa LUL adenocarcinoma, chronic hypoxic respiratory failure 6L O2 at home, HTN, HLD who presented with AMS. She underwent extensive workup but ultimately an LP was declined. She was treated empirically with abx and acyclovir for encephalitis and her mentation improved.   Assessment and Plan * SIRS (systemic inflammatory response syndrome) (HCC) - no clear source ever presented; evaluated by ID as well; LP not pursued per family wish - s/p rocephin and acyclovir - continues on monotherapy acyclovir to complete total of 14 days per ID  Acute metabolic encephalopathy-resolved as of 09/20/2021 - presented with AMS and has slowly improved during hospitalization  - now AO (name, place, year)  Acute on chronic respiratory failure with hypoxia and hypercapnia (HCC) - on 6L O2 at home via concentrator  - down to ~8L this morning - continue weaning O2 back down and turn up with activity as needed  Adenocarcinoma of left lung, stage 3 (HCC) Stage IIIA (T4, N1, M0) non-small cell lung cancer, adenocarcinoma presented with large left upper lobe lung mass with chest wall invasion in addition to left suprahilar lymphadenopathy diagnosed in August 2022  - follows with Dr. Lisbeth Renshaw and Dr. Julien Nordmann - palliative care also following; currently full scope of care per family and patient  - undergoing daily radiation still -No further chemo being offered per oncology due to overall poor performance status.  Recommendation is for patient and family to consider palliative care/hospice however second opinion being considered; would be outpatient referral - low physical reserve; concern is for lack of ability to tolerate  - follow up oncology consult per PCCM request (concern for patient's functional decline and overall  quality of life/prognosis)  PAF (paroxysmal atrial fibrillation) ([place]) - followed by cardiology during hospitalization as well; likely precipitated by SIRS/infection/hospitalization  - heparin transitioned to Eliquis on 10/26 - continue amiodarone  - rec's are for 30 day outpt event monitor and if no recurrence of afib outpatient then possibility of stopping treatment   COPD (chronic obstructive pulmonary disease) ([place]) - continue O2 and breathing treatments     Subjective:  No events overnight.  Patient states that she stood up with physical therapy yesterday but could not tell me for how long or further elaborate.  Family present bedside this morning.  Breathing appears comfortable and she was in no distress.  Had no questions this morning.  Objective Vitals:   09/21/21 0900 09/21/21 1000 09/21/21 1100 09/21/21 1200  BP: 128/65 115/65  129/78  Pulse: 100 (!) 103 (!) 102 (!) 106  Resp: _0 Temp:    (!) 97.4 F (36.3 C)  TempSrc:    Oral  SpO2: 90% 95% 93% 92%  Weight:      Height:       85.4 kg  Vital signs were reviewed and unremarkable.   Exam Physical Exam Constitutional:      General: She is not in acute distress.    Appearance: Normal appearance.  HENT:     Head: Normocephalic and atraumatic.     Mouth/Throat:     Mouth: Mucous membranes are moist.  Eyes:     Extraocular Movements: Extraocular movements intact.  Cardiovascular:     Rate and Rhythm: Normal rate and regular rhythm.  Heart sounds: Normal heart sounds.  Pulmonary:     Effort: Pulmonary effort is normal.     Breath sounds: Normal breath sounds.  Abdominal:     General: Bowel sounds are normal. There is no distension.     Palpations: Abdomen is soft.     Tenderness: There is no abdominal tenderness.  Musculoskeletal:        General: Normal range of motion.     Cervical back: Normal range of motion and neck supple.  Skin:    General: Skin is warm and dry.  Neurological:      General: No focal deficit present.     Mental Status: She is alert.  Psychiatric:        Mood and Affect: Mood normal.        Behavior: Behavior normal.     Labs / Other Information My review of labs, imaging, notes and other tests shows no new significant findings.    Disposition Plan: Status is: Inpatient  Remains inpatient appropriate because: ongoing treatment of above    Time spent: Greater than 50% of the 35 minute visit was spent in counseling/coordination of care for the patient as laid out in the A&P.  Dwyane Dee, MD [facility] 09/21/2021, 12:53 PM

## 2021-09-21 NOTE — Progress Notes (Signed)
Speech Language Pathology Treatment: Dysphagia  Patient Details Name: Patricia Sampson MRN: [id number] DOB: 1955-12-31 Today's Date: 09/21/2021 Time: 0071-2197 SLP Time Calculation (min) (ACUTE ONLY): 21 min  Assessment / Plan / Recommendation Clinical Impression  Pt seen for skilled SLP dysphagia treatment.  Pt in room, fully alert, on HFNC and RN readying to give pt medications po.  Pt desaturates with any effort to low 80's without consistent awareness.  Pt reports displeasure with food - stating it doesn't taste good.  Dentures are at home - and pt advises she uses them for eating solids- daughter called and family member is to bring dentures in today.  Clinical observation of intake with water, medication, applesauce and cracker boluses *limited.  Pt only consumed a few bites due to lack of dentition - but able to orally clear without retention nor severe decline in oxygenation.  Pt admits to occasional coughing with intake since starting radiation - occurring with solids more than liquids.    Given pt intake is poor and her desire for advanced textures, recommend dys3/thin diet.  Advised pt, daughter and son to recommendations re: po intake - most importantly assuring pt is not dyspneic with intake and need to take frequent rest breaks.    Pt also appears with a few areas "? Mucositis" in posterior oral cavity - denies pain with swallowing at this time but admits to prior discomfort from chemoradiation. Recommend consider treatment to maximize comfort of swallow (? Magic mouthrinse or Lidocaine suspension.   Will follow up briefly for family education.     HPI HPI: Patient is a 65 y.o. female with PMH: very severe COPD, associated chronic hypoxemic respiratory failure, HTN, HLD. 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 She was having AMS on 10/17 and when in ED was tachycardic, tachypneic with  pancytopenia. She was admitted with acute metabolic encephalopathy and treated empirically for possible sepsis, source unknown. CXR revealed left LL airspace opacity consistent with known primary broncogenic carcinoma, trace right pleural effusion, interval development of bibasilar patchy airspace opacities which may represent a combination of infection/inflammation and/or atelectasis.      SLP Plan  Continue with current plan of care      Recommendations for follow up therapy are one component of a multi-disciplinary discharge planning process, led by the attending physician.  Recommendations may be updated based on patient status, additional functional criteria and insurance authorization.    Recommendations  Diet recommendations: Dysphagia 3 (mechanical soft);Thin liquid Liquids provided via: Straw;Cup Medication Administration:  (as tolerated, use puree if improved tolerance) Compensations: Minimize environmental distractions;Slow rate;Small sips/bites Postural Changes and/or Swallow Maneuvers: Seated upright 90 degrees;Upright 30-60 min after meal                Oral Care Recommendations: Oral care BID Follow up Recommendations: None SLP Visit Diagnosis: Dysphagia, unspecified (R13.10) Plan: Continue with current plan of care       GO              Kathleen Lime, MS [place] [facility] [phone] Pager [phone]   Macario Golds  09/21/2021, 9:30 AM

## 2021-09-21 NOTE — Progress Notes (Signed)
Mg 1.8 Replaced per protocol  

## 2021-09-21 NOTE — Progress Notes (Signed)
NAME:  Patricia Sampson, MRN:  [id number], DOB:  1956-09-26, LOS: 43 ADMISSION DATE:  09/09/2021, CONSULTATION DATE: 09/10/2021 REFERRING MD: Dr. Marylyn Ishihara, CHIEF COMPLAINT: Encephalopathy  BRIEF  65 year old woman with very severe COPD, associated chronic hypoxemic respiratory failure (4-8 L/min), hypertension, hyperlipidemia.  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 She was noted to have altered mental status on the morning of 10/17, appeared to be normal when she went to bed the night before.  In the emergency department she was tachycardic, tachypneic with pancytopenia.  Last chemotherapy infusion appears to have been 09/05/2021.  Acute metabolic encephalopathy.  She was treated empirically for possible sepsis, source unknown. Home med list with Percocet, Neurontin.  Her husband notes that he does not believe she is been taking any of her medicines for the last 2 to 3 days.   Pertinent  Medical History   Past Medical History:  Diagnosis Date   Angioedema    COPD (chronic obstructive pulmonary disease) (HCC)    FHx: migraine headaches    Hyperlipidemia    Hypertension    Seasonal allergies     Significant Hospital Events: Including procedures, antibiotic start and stop dates in addition to other pertinent events   CT abdomen pelvis 10/17 >> no acute findings Head CT 10/17 >> normal CT-PA chest 10/17 >> no new infiltrates or significant change in the left upper lobe mass.  No PE MRI brain 10/17 >> Motion degradation compromise interpretation, no acute or subacute infarct, no mass lesion  Progressive left upper lobe primary bronchogenic carcinoma with increased destruction of the anterior left second rib. New perilymphatic nodularity in the left upper lobe adjacent to the mass, consistent with lymphangitic carcinomatosis. 3. 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  EEG 10/18 with  generalized slowing, no evidence of active seizures or epileptiform activity 10/18 acyclovir and ampicillin added to cover possible meningitis 10/19 SVT, likely A. fib/flutter noted after adenosine given.  Hypotension with diltiazem, then started amiodarone.  Back in sinus tachycardia 10/20 - SVT with what looks like A. fib/flutter after adenosine given early evening 10/19.  No real response to diltiazem infusion, then given amiodarone.  Back in sinus tachycardia, . \atient continued to be aphasic and to have agitation through the day 10/19.  Now off Precedex. I/O positive 8.7 L total. Has had hypokalemia, replaced. WBC increasing, 2.9 this morning 10/20  10/21 -husband and son at the bedside.  They tell me that after she had COVID in 2020 she has been on 6 L oxygen.  For the last 3 months before admissions she has had 35 pound weight loss approximately.  August 2022 was diagnosed with cancers in September 2022 was on chemoradiation.  Since chemoradiation there is more failure to thrive with progressive decrease in activity particularly after chemoradiation cycles. Currently on 10 L nasal cannula pulse ox 95%.  Has not been on BiPAP.  Has not been on the ventilator.  Is not on pressors.  Not on Precedex  10/22 -had severe worsening of respiratory acidosis acute on chronic with a pH of 7.22 and a PCO2 of 90 associated with worsening bilateral lower lobe atelectasis [has left upper lobe cancer] on chest x-ray compared to 09/10/2021.  Treated with BiPAP.  Blood gas subsequent improvement but did not tolerate BiPAP.  Nonetheless mental status today better.  Husband also acknowledges that she is looking better.  Cachectic elderly female.  Significant physical deconditioning .  currently on 6 L nasal cannula pulse ox 95%. Continues on IV heparin infusion and amio infusion.  On acyclovir and ampicillin and ceftriaxone for potential meningitis.  Afebrile since 09/13/2021 but leukopenic with a white count of 2.4K.   Culture negative so far Pall care consult - full code + but family meeting being arranged   Interim History / Subjective:  Today she feels about the same. She has some skin burning/ pain from radiation on her left chest and back. Dr. Earlie Server was in this morning talking to her.  Objective   Blood pressure (!) 143/72, pulse (!) 107, temperature 98 F (36.7 C), temperature source Oral, resp. rate (!) 24, height _0  (1.753 m), weight 85.4 kg, SpO2 98 %.    FiO2 (%):  [50 %] 50 % Estimated body mass index is 27.8 kg/m as calculated from the following:   Height as of this encounter: _1  (1.753 m).   Weight as of this encounter: 85.4 kg.   Intake/Output Summary (Last 24 hours) at 09/21/2021 1043 Last data filed at 09/21/2021 0200 Gross per 24 hour  Intake 634.55 ml  Output 1900 ml  Net -1265.45 ml    Filed Weights   09/16/21 1200 09/16/21 1600 09/18/21 0500  Weight: 75 kg 75 kg 85.4 kg    Examination: General Appearance: frail, cachectic woman sitting up in bed in NAD Head: [place]/AT, eyes anicteric Neck: Supple  Lungs: Breathing comfortably on nasal cannula, currently on 9 to 10 L.  No wheezing.  Thick clear blood-tinged sputum. Heart: S1 S2, regular rate and rhythm Abdomen: Soft, nontender, nondistended Extremities: Clubbing, improving lower extremity edema  skin: Darkening of skin over her left chest, warm, dry Neurologic: Fatigued appearing, globally weak.  Comprehension appears intact.  Na+ 138 K+ 3.7 BUN 9 Cr 0.54 WBC 3.6 H/H 7.6/25.8   Resolved Hospital Problem list     Assessment & Plan:   Acute on chronic respiratory failure with hypoxia and hypercapnia- due to acute COPD exacerbation and progressive lung adenocarcinoma.  Her baseline lung function is severely impaired (FEV1 24% predicted in 2021). She has very widespread cancer throughout the left lung. -Continue bronchodilators.  As she gets closer to discharge would plan to send home on triple inhaled  therapy-LABA, LABA, ICS.  - Ok to continue to wean prednisone. - Continue diuresis. - BiPAP as needed if she can tolerate -Radiation again today.   Acute encephalopathy with agitated delirium- improving. - Empiric acyclovir x14 days per ID recommendations.  Patient's family previously refused LP. -reorientation, OOB mobility, family visitation -B12 supplements  Acute on chronic pain -fentanyl PRN  SVT, paroxysmal AF, currently NSR  - Appreciate cardiology's management.  Con't amio & heparin.  - tele  Adenocarcinoma of the lung, on chemoradiation- Stage IIIA (T4, N1, M0)  adenocarcinoma - presented with large left upper lobe lung mass with chest wall invasion in addition to left suprahilar lymphadenopathy diagnosed in August 2022.  Despite treatment she has had progression with rib destruction. -Appreciate Palliative Care team's input. I spoke to Dr. Earlie Server today and he has spoken to Ms. [place] and her family regarding the limitations of her treatment with her weakness and limited functional status and severe respiratory failure.  Chronic anemia, stable Acute thrombocytopenia Acute leukopenia --con't to monitor -tranfuse for Hb<7 or hemodynamically significant bleeding  Hyponatremia, likely hypervolemic- resolved Hypokalemia, resolved Mild hypomagnesmia, resolved -con't electrolyte monitor  Failure to thrive with > 30# weight  loss PTA Cancer pain on chronic opioid Severe  Protein calorie malnutrition  -Ongoing GOC discussions, appreciate Palliative Care's input. - Con't  Ensure 3 times daily  Poor prognosis given baseline severe COPD and hypoxic respiratory failure, with spreading adenocarcinoma and known hilar nodal disease, now with significant deconditioning and ongoing acute on chronic respiratory failure. I worry that her performance status may not ever recover to the point that she would be a candidate for chemo with how limited her reserve is-- we discussed this again  today and Dr. Earlie Server has discussed this.  Her family wants to continue all aggressive care measures and hopes she can still be a candidate for future therapies and wants to work towards rehabilitation. Her son is very motivated for her to achieve this. Code status not discussed today. No changes to her therapy at this time. They understand she needs to be on less oxygen to be able to go home. > 30 min spent in [place] discussions.  Best Practice (right click and "Reselect all SmartList Selections" daily)   Diet/type: Regular consistency (see orders) DVT prophylaxis: other GI prophylaxis: N/A Lines: N/A Foley:  N/A Code Status:  full code Last date of multidisciplinary goals of care discussion [10/27-- son, niece, patient. She remains full code at this time.]   LABS       PULMONARY Recent Labs  Lab 09/15/21 1225 09/15/21 1700 09/17/21 1000  PHART 7.227* 7.348* 7.315*  PCO2ART 90.6* 67.9* 81.5*  PO2ART 97.5 51.8* 56.1*  HCO3 36.3* 36.4* 40.3*  O2SAT 97.3 85.8 87.8     CBC Recent Labs  Lab 09/19/21 0513 09/20/21 0237 09/21/21 0507  HGB 8.0* 7.3* 7.6*  HCT 27.3* 24.5* 25.8*  WBC 3.0* 3.4* 3.6*  PLT 131* 179 146*     COAGULATION No results for input(s): INR in the last 168 hours.   CARDIAC  No results for input(s): TROPONINI in the last 168 hours. No results for input(s): PROBNP in the last 168 hours.   CHEMISTRY Recent Labs  Lab 09/15/21 0255 09/15/21 1443 09/17/21 0420 09/18/21 0228 09/19/21 0513 09/20/21 0237 09/20/21 1852 09/21/21 0507  NA 136   < > 132* 134* 135 133* 137 138  K 3.0*   < > 3.7 3.3* 3.6 3.0* 3.8 3.7  CL 93*   < > 89* 88* 83* 79* 81* 80*  CO2 33*   < > 35* 40* 46* >45* >45* >45*  GLUCOSE 118*   < > 105* 91 118* 132* 139* 92  BUN 10   < > 6* <5* 5* _0 CREATININE 0.62   < > 0.51 0.55 0.60 0.76 0.47 0.54  CALCIUM 7.8*   < > 8.4* 8.2* 8.3* 7.7* 8.6* 8.4*  MG 1.7   < > 1.9 2.0 1.5* 1.4* 2.2 1.8  PHOS 2.8  --  3.0 2.5  --   --   --    --    < > = values in this interval not displayed.    Estimated Creatinine Clearance: 84 mL/min (by C-G formula based on SCr of 0.54 mg/dL).   LIVER Recent Labs  Lab 09/17/21 0420 09/18/21 0228  AST 10* 11*  ALT 8 7  ALKPHOS 71 68  BILITOT 0.4 0.3  PROT 6.1* 5.8*  ALBUMIN 2.1* 2.0*       Julian Hy, DO 09/21/21 2:24 PM [facility]

## 2021-09-21 NOTE — Evaluation (Signed)
Occupational Therapy Evaluation Patient Details Name: ODESSA NISHI MRN: [id number] DOB: 02-27-1956 Today's Date: 09/21/2021   History of Present Illness REHMAT MURTAGH is a 65 y.o. female with PMH Stage IIIa LUL adenocarcinoma, chronic hypoxic respiratory failure 6L O2 at home, HTN, HLD who presented with AMS 09/12/2021,   Clinical Impression   PTA, pt was living at home with family members who assisted her with IADLs and min guard assist with functional mobility. Pt was using rollator at home and requires continuous O2 (reporting needing 6L -10L at times). Upon evaulation, pt with decreased balance, activity tolerance, functional strength, and cardiopulmonary status limiting functional abilities. Pt on 10 L O2 upon arrival. SpO2 dropping to 86% sitting EOB, encouraged and educated on pursed lip breathing. SpO2 rising to 92%. SpO2 dropping to 73% with stand-pivot to recliner, but difficult to assess accuracy with inconsistent pleth line and rising to 93% in 30 seconds. Pt currently requires set up for UB ADLs, Mod A +2 for LB ADLs, and Mod A +2 for functional mobility with BUE support. Pt will benefit from skilled OT services while in hospital to improve deficits and learn compensatory strategies as needed in order to return to PLOF. Due to current level of function, recommending SNF rehab post d/c. Pt in agreement with POC and reports she feels she needs additional therapy before she goes home.       Recommendations for follow up therapy are one component of a multi-disciplinary discharge planning process, led by the attending physician.  Recommendations may be updated based on patient status, additional functional criteria and insurance authorization.   Follow Up Recommendations  Skilled nursing-short term rehab (<3 hours/day)    Assistance Recommended at Discharge    Functional Status Assessment  Patient has had a recent decline in their functional status and demonstrates the ability to  make significant improvements in function in a reasonable and predictable amount of time.  Equipment Recommendations  Other (comment) (defer to next venue)    Recommendations for Other Services       Precautions / Restrictions Precautions Precautions: Fall Precaution Comments: on 10L O2, watch sats Restrictions Weight Bearing Restrictions: No      Mobility Bed Mobility Overal bed mobility: Needs Assistance Bed Mobility: Supine to Sit;Sit to Sidelying     Supine to sit: Min guard;HOB elevated     General bed mobility comments: for safety, increased time    Transfers Overall transfer level: Needs assistance   Transfers: Stand Pivot Transfers   Stand pivot transfers: Mod assist;+2 physical assistance         General transfer comment: Required assistance for power up and guiding to sit in recliner. Required min multimodal cues to pivot to chair.      Balance Overall balance assessment: Needs assistance Sitting-balance support: Feet supported;No upper extremity supported Sitting balance-Leahy Scale: Fair Sitting balance - Comments: static sitting good, dynamic fair.   Standing balance support: Bilateral upper extremity supported;Reliant on assistive device for balance Standing balance-Leahy Scale: Poor Standing balance comment: reliant on BUE support                           ADL either performed or assessed with clinical judgement   ADL Overall ADL's : Needs assistance/impaired Eating/Feeding: Modified independent   Grooming: Sitting;Minimal assistance   Upper Body Bathing: Set up;Sitting   Lower Body Bathing: Moderate assistance;Sit to/from stand;+2 for physical assistance   Upper Body Dressing : Set up;Sitting  Lower Body Dressing: Moderate assistance;Sit to/from stand;+2 for physical assistance Lower Body Dressing Details (indicate cue type and reason): able to reach to ankles to pull up socks in sitting Toilet Transfer: Moderate  assistance;+2 for physical assistance;BSC;Stand-pivot   Toileting- Clothing Manipulation and Hygiene: Maximal assistance;Sit to/from stand;+2 for physical assistance Toileting - Clothing Manipulation Details (indicate cue type and reason): required assist to perform perianal care after BM     Functional mobility during ADLs: Moderate assistance;+2 for physical assistance       Vision Patient Visual Report: No change from baseline       Perception     Praxis      Pertinent Vitals/Pain Pain Assessment: Faces Faces Pain Scale: Hurts little more Pain Location: L shoulder Pain Descriptors / Indicators: Aching;Grimacing;Guarding Pain Intervention(s): Limited activity within patient's tolerance;Monitored during session     Hand Dominance Right   Extremity/Trunk Assessment Upper Extremity Assessment Upper Extremity Assessment: LUE deficits/detail;RUE deficits/detail RUE Deficits / Details: MMT 4/5 grossly LUE Deficits / Details: Limited shoulder flexion, less than 1/2 range. MMT 4/5 grossly at elbow and grip strength.   Lower Extremity Assessment Lower Extremity Assessment: Defer to PT evaluation   Cervical / Trunk Assessment Cervical / Trunk Assessment: Kyphotic   Communication Communication Communication: No difficulties   Cognition Arousal/Alertness: Awake/alert Behavior During Therapy: WFL for tasks assessed/performed Overall Cognitive Status: Within Functional Limits for tasks assessed                                 General Comments: Anxious to perform transfer to chair, reassured pt of safety. required encouragement     General Comments  sister present throughout    Exercises     Shoulder Instructions      Home Living Family/patient expects to be discharged to:: Private residence Living Arrangements: Spouse/significant other;Children;Other relatives Available Help at Discharge: Family Type of Home: House Home Access: Stairs to  enter [employer] of Steps: 2 Entrance Stairs-Rails: None       Bathroom Shower/Tub: Tub/shower unit         Home Equipment: Rollator (4 wheels)   Additional Comments: to get a ramp      Prior Functioning/Environment Prior Level of Function : Needs assist       Physical Assist : Mobility (physical);ADLs (physical) Mobility (physical): Gait;Transfers ADLs (physical): IADLs Mobility Comments: used rollator, pt reports independence, however pt's sister present and reporting she needed someone there for safety during mobility. Sister reports she performed ADLs independently.          OT Problem List: Decreased activity tolerance;Impaired balance (sitting and/or standing);Decreased knowledge of use of DME or AE;Decreased knowledge of precautions;Decreased safety awareness;Pain;Cardiopulmonary status limiting activity      OT Treatment/Interventions: Self-care/ADL training;Therapeutic exercise;Energy conservation;DME and/or AE instruction;Therapeutic activities;Patient/family education;Balance training    OT Goals(Current goals can be found in the care plan section) Acute Rehab OT Goals Patient Stated Goal: To go to rehab OT Goal Formulation: With patient Time For Goal Achievement: 10/05/21 Potential to Achieve Goals: Good  OT Frequency: Min 2X/week   Barriers to D/C:            Co-evaluation              AM-PAC OT "6 Clicks" Daily Activity     Outcome Measure Help from another person eating meals?: None Help from another person taking care of personal grooming?: A Little Help from another person toileting,  which includes using toliet, bedpan, or urinal?: A Lot Help from another person bathing (including washing, rinsing, drying)?: A Lot Help from another person to put on and taking off regular upper body clothing?: A Little Help from another person to put on and taking off regular lower body clothing?: A Lot 6 Click Score: 16   End of Session  Equipment Utilized During Treatment: Gait belt;Oxygen Nurse Communication: Mobility status  Activity Tolerance: Patient tolerated treatment well Patient left: in chair;with call bell/phone within reach;with chair alarm set  OT Visit Diagnosis: Unsteadiness on feet (R26.81);History of falling (Z91.81);Muscle weakness (generalized) (M62.81);Pain                Time: 1311-1341 OT Time Calculation (min): 30 min Charges:  OT General Charges $OT Visit: 1 Visit OT Evaluation $OT Eval Moderate Complexity: 1 Mod OT Treatments $Self Care/Home Management : 8-22 mins  Jackquline Denmark, OTS Acute Rehab Office: [phone]   Trevaun Rendleman 09/21/2021, 4:57 PM

## 2021-09-22 ENCOUNTER — Inpatient Hospital Stay (HOSPITAL_COMMUNITY): Payer: 59

## 2021-09-22 ENCOUNTER — Ambulatory Visit: Payer: 59

## 2021-09-22 ENCOUNTER — Ambulatory Visit
Admission: RE | Admit: 2021-09-22 | Discharge: 2021-09-22 | Disposition: A | Discharge status: Home / Self Care | Payer: 59 | Source: Ambulatory Visit | Attending: Radiation Oncology | Admitting: Radiation Oncology

## 2021-09-22 DIAGNOSIS — J9622 Acute and chronic respiratory failure with hypercapnia: Secondary | ICD-10-CM | POA: Diagnosis not present

## 2021-09-22 DIAGNOSIS — R6510 Systemic inflammatory response syndrome (SIRS) of non-infectious origin without acute organ dysfunction: Secondary | ICD-10-CM | POA: Diagnosis not present

## 2021-09-22 DIAGNOSIS — G9341 Metabolic encephalopathy: Secondary | ICD-10-CM | POA: Diagnosis not present

## 2021-09-22 DIAGNOSIS — J9621 Acute and chronic respiratory failure with hypoxia: Secondary | ICD-10-CM | POA: Diagnosis not present

## 2021-09-22 LAB — MAGNESIUM: Magnesium: 2 mg/dL (ref 1.7–2.4)

## 2021-09-22 LAB — BASIC METABOLIC PANEL
BUN: 13 mg/dL (ref 8–23)
CO2: >45 mmol/L — ABNORMAL HIGH (ref 22–32)
Calcium: 8.3 mg/dL — ABNORMAL LOW (ref 8.9–10.3)
Chloride: 79 mmol/L — ABNORMAL LOW (ref 98–111)
Creatinine, Ser: 0.67 mg/dL (ref 0.44–1.00)
GFR, Estimated: >60 mL/min (ref >60)
Glucose, Bld: 138 mg/dL — ABNORMAL HIGH (ref 70–99)
Potassium: 3.6 mmol/L (ref 3.5–5.1)
Sodium: 136 mmol/L (ref 135–145)

## 2021-09-22 IMAGING — DX DG CHEST 1V PORT
1 series · 1 of 1 positions shown · non-contrast
Comparison: [DATE] [DATE], [DATE].  [DATE] [DATE], [DATE].

CLINICAL DATA: Dyspnea.  History of lung cancer.

EXAM:
PORTABLE CHEST 1 VIEW

[chest ap]
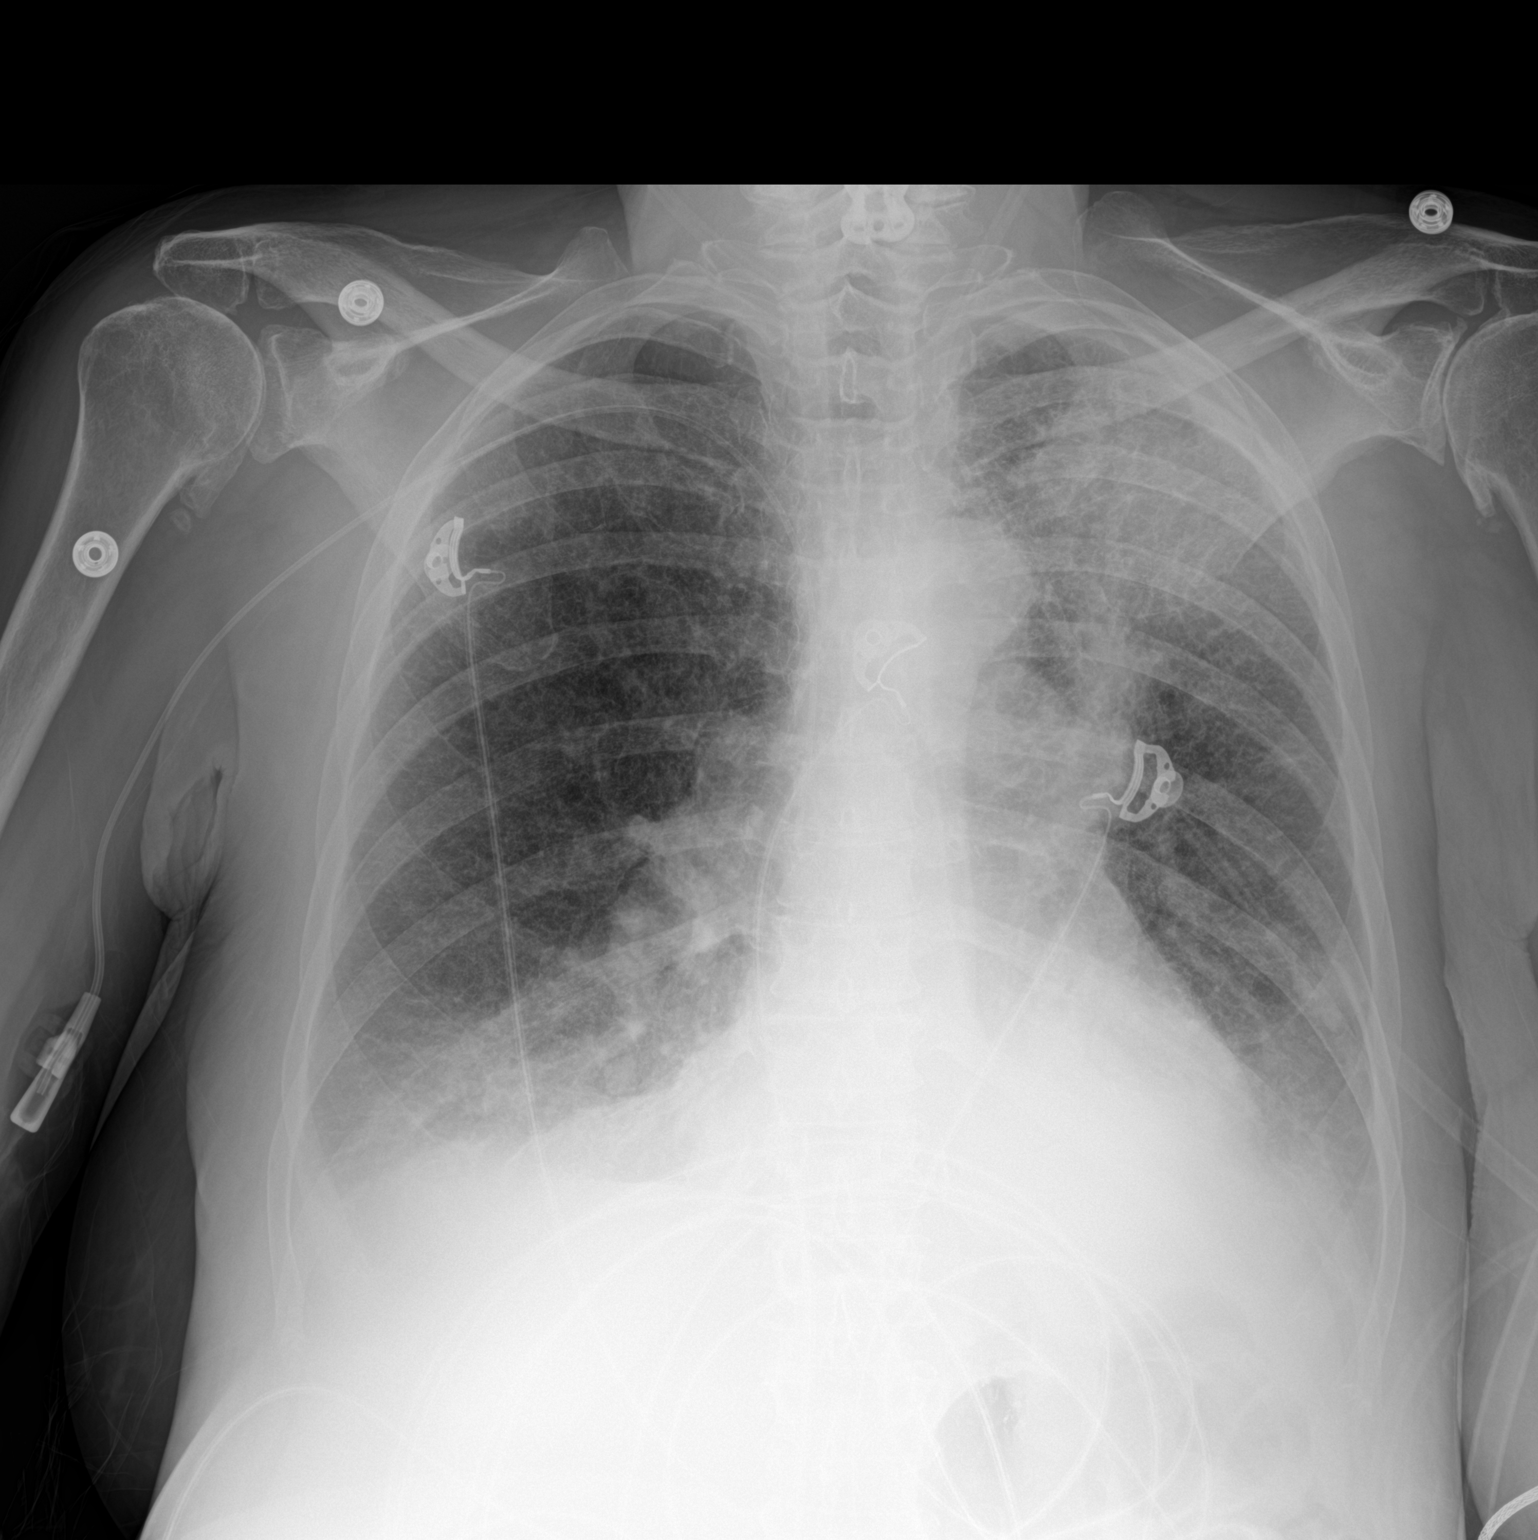

[1 of 1 positions shown; findings below may reference images not displayed]

FINDINGS: The heart size and mediastinal contours are within normal limits.
Right-sided PICC line is unchanged in position. Stable left upper
lobe opacity is noted consistent with neoplasm. Stable bibasilar
atelectasis or edema is noted with small pleural effusions. No
pneumothorax is noted. The visualized skeletal structures are
unremarkable.
IMPRESSION: Stable left upper lobe opacity is noted consistent with neoplasm.
Stable bibasilar edema or atelectasis is noted with small pleural
effusions.

## 2021-09-22 MED ORDER — CHLORHEXIDINE GLUCONATE CLOTH 2 % EX PADS
6.0000 | MEDICATED_PAD | Freq: Every day | CUTANEOUS | Status: DC
  Administered 2021-09-22 – 2021-10-09 (×16): 6 via TOPICAL

## 2021-09-22 MED ORDER — PREDNISONE 20 MG PO TABS
20.0000 mg | ORAL_TABLET | Freq: Every day | ORAL | Status: DC
  Administered 2021-09-23 – 2021-09-30 (×8): 20 mg via ORAL
  Filled 2021-09-22 (×8): qty 1

## 2021-09-22 NOTE — Progress Notes (Signed)
Progress Note    Patricia Sampson   Patricia Sampson  Patricia Sampson, Patricia Sampson  DOA: 09/20/2021     11 Date of Service: 09/22/2021   Clinical Course Patricia Sampson is a 65 y.o. female with PMH Stage IIIa LUL adenocarcinoma, chronic hypoxic respiratory failure 6L O2 at home, HTN, HLD who presented with AMS. She underwent extensive workup but ultimately an LP was declined. She was treated empirically with abx and acyclovir for encephalitis and her mentation improved.    Assessment and Plan * SIRS (systemic inflammatory response syndrome) (HCC)-resolved as of 09/22/2021 - no clear source ever presented; evaluated by ID as well; LP not pursued per family wish - s/p rocephin and acyclovir - continues on monotherapy acyclovir to complete total of 14 days per ID  Acute metabolic encephalopathy-resolved as of 09/20/2021 - presented with AMS and has slowly improved during hospitalization  - now AO (name, place, year)  Acute on chronic respiratory failure with hypoxia and hypercapnia (HCC) - on 6L O2 at home via concentrator and increased with activity - down to ~8L this morning - continue weaning O2 back down and turn up with activity as needed  Adenocarcinoma of left lung, stage 3 (HCC) Stage IIIA (T4, N1, M0) non-small cell lung cancer, adenocarcinoma presented with large left upper lobe lung mass with chest wall invasion in addition to left suprahilar lymphadenopathy diagnosed in August 2022  - follows with Dr. Lisbeth Renshaw and Dr. Julien Nordmann - palliative care also following; currently full scope of care per family and patient  - undergoing daily radiation still -No further chemo being offered per oncology due to overall poor performance status.  Recommendation is for patient and family to consider palliative care/hospice however second opinion being considered; would be outpatient referral - low physical reserve; concern is for lack of ability to tolerate   PAF (paroxysmal atrial fibrillation)  ([place]) - followed by cardiology during hospitalization as well; likely precipitated by SIRS/infection/hospitalization  - heparin transitioned to Eliquis on 10/26 - continue amiodarone  - rec's are for 30 day outpt event monitor and if no recurrence of afib outpatient then possibility of stopping treatment   COPD (chronic obstructive pulmonary disease) ([place]) - continue O2 and breathing treatments -Steroid taper being initiated per pulmonology and arranging for nightly BiPAP    Subjective:  No events overnight.  Feeling a little more weak and fatigued this morning.  Was seen about to be taken down to radiation as well.  Son and husband were bedside this morning.   Objective Vitals:   09/22/21 0604 09/22/21 0757 09/22/21 0836 09/22/21 1245  BP:      Pulse: 97     Resp: 16     Temp:   98 F (36.7 C) 98.6 F (37 C)  TempSrc:   Oral Oral  SpO2: 96% 95%    Weight:      Height:       85.4 kg  Vital signs were reviewed and unremarkable.   Exam Physical Exam Constitutional:      Comments: Awake and alert but fatigued appearing as usual  HENT:     Head: Normocephalic and atraumatic.     Mouth/Throat:     Mouth: Mucous membranes are moist.  Eyes:     Extraocular Movements: Extraocular movements intact.  Cardiovascular:     Rate and Rhythm: Regular rhythm. Tachycardia present.     Heart sounds: Normal heart sounds.  Pulmonary:     Effort: Pulmonary effort is normal.  Breath sounds: Normal breath sounds.  Abdominal:     General: Bowel sounds are normal. There is no distension.     Palpations: Abdomen is soft.     Tenderness: There is no abdominal tenderness.  Musculoskeletal:        General: Normal range of motion.     Cervical back: Normal range of motion and neck supple.  Skin:    General: Skin is warm and dry.  Neurological:     General: No focal deficit present.  Psychiatric:        Mood and Affect: Mood normal.        Behavior: Behavior normal.     Labs /  Other Information My review of labs, imaging, notes and other tests shows no new significant findings.    Disposition Plan: Status is: Inpatient  Remains inpatient appropriate because: Treatment of above    Time spent: Greater than 50% of the 35 minute visit was spent in counseling/coordination of care for the patient as laid out in the A&P.  Dwyane Dee, MD [facility] 09/22/2021, 12:57 PM

## 2021-09-22 NOTE — Progress Notes (Signed)
Pt placed on bipap (dreamstation) with 7L O2. Pt is very nervous and advised pt to keep it on as long as she can. She said she will try. Pt is toleration it well at this time. RT will continue to monitor.

## 2021-09-22 NOTE — Progress Notes (Addendum)
NAME:  Patricia Sampson, MRN:  [id number], DOB:  [birth year], LOS: 57 ADMISSION DATE:  09/10/2021, CONSULTATION DATE: 09/02/2021 REFERRING MD: Dr. Marylyn Ishihara, CHIEF COMPLAINT: Encephalopathy  BRIEF  65 year old woman with very severe COPD, associated chronic hypoxemic respiratory failure (4-8 L/min), hypertension, hyperlipidemia.  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 She was noted to have altered mental status on the morning of 10/17, appeared to be normal when she went to bed the night before.  In the emergency department she was tachycardic, tachypneic with pancytopenia.  Last chemotherapy infusion appears to have been 09/05/2021.  Acute metabolic encephalopathy.  She was treated empirically for possible sepsis, source unknown. Home med list with Percocet, Neurontin.  Her husband notes that he does not believe she is been taking any of her medicines for the last 2 to 3 days.   Pertinent  Medical History   Past Medical History:  Diagnosis Date   Angioedema    COPD (chronic obstructive pulmonary disease) (HCC)    FHx: migraine headaches    Hyperlipidemia    Hypertension    Seasonal allergies     Significant Hospital Events: Including procedures, antibiotic start and stop dates in addition to other pertinent events   CT abdomen pelvis 10/17 >> no acute findings Head CT 10/17 >> normal CT-PA chest 10/17 >> no new infiltrates or significant change in the left upper lobe mass.  No PE MRI brain 10/17 >> Motion degradation compromise interpretation, no acute or subacute infarct, no mass lesion  Progressive left upper lobe primary bronchogenic carcinoma with increased destruction of the anterior left second rib. New perilymphatic nodularity in the left upper lobe adjacent to the mass, consistent with lymphangitic carcinomatosis. 3. 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  EEG 10/18 with  generalized slowing, no evidence of active seizures or epileptiform activity 10/18 acyclovir and ampicillin added to cover possible meningitis 10/19 SVT, likely A. fib/flutter noted after adenosine given.  Hypotension with diltiazem, then started amiodarone.  Back in sinus tachycardia 10/20 - SVT with what looks like A. fib/flutter after adenosine given early evening 10/19.  No real response to diltiazem infusion, then given amiodarone.  Back in sinus tachycardia, . \atient continued to be aphasic and to have agitation through the day 10/19.  Now off Precedex. I/O positive 8.7 L total. Has had hypokalemia, replaced. WBC increasing, 2.9 this morning 10/20  10/21 -husband and son at the bedside.  They tell me that after she had COVID in 2020 she has been on 6 L oxygen.  For the last 3 months before admissions she has had 35 pound weight loss approximately.  August 2022 was diagnosed with cancers in September 2022 was on chemoradiation.  Since chemoradiation there is more failure to thrive with progressive decrease in activity particularly after chemoradiation cycles. Currently on 10 L nasal cannula pulse ox 95%.  Has not been on BiPAP.  Has not been on the ventilator.  Is not on pressors.  Not on Precedex  10/22 -had severe worsening of respiratory acidosis acute on chronic with a pH of 7.22 and a PCO2 of 90 associated with worsening bilateral lower lobe atelectasis [has left upper lobe cancer] on chest x-ray compared to 09/19/2021.  Treated with BiPAP.  Blood gas subsequent improvement but did not tolerate BiPAP.  Nonetheless mental status today better.  Husband also acknowledges that she is looking better.  Cachectic elderly female.  Significant physical deconditioning .  currently on 6 L nasal cannula pulse ox 95%. Continues on IV heparin infusion and amio infusion.  On acyclovir and ampicillin and ceftriaxone for potential meningitis.  Afebrile since 09/13/2021 but leukopenic with a white count of 2.4K.   Culture negative so far Pall care consult - full code + but family meeting being arranged   Interim History / Subjective:  Today patient states she is nauseated. RN had to increase her oxygen from 6 L to 8 L . She has some skin burning/ pain from radiation on her left chest and back. Dr. Earlie Server spoke with patient 10/27 about the fact he did not feel she was a good candidate for further chemotherapy, or targeted therapy, because of her poor performance status and worsening respiratory failure. Palliation was consulted, but patient remains a Full Code.  Family are at bedside  Objective   Blood pressure 108/61, pulse 97, temperature 98 F (36.7 C), temperature source Oral, resp. rate 16, height _0  (1.753 m), weight 85.4 kg, SpO2 95 %.    FiO2 (%):  [50 %] 50 % Estimated body mass index is 27.8 kg/m as calculated from the following:   Height as of this encounter: _1  (1.753 m).   Weight as of this encounter: 85.4 kg.   Intake/Output Summary (Last 24 hours) at 09/22/2021 0848 Last data filed at 09/22/2021 0630 Gross per 24 hour  Intake 1116.23 ml  Output 1250 ml  Net -133.77 ml   Filed Weights   09/16/21 1200 09/16/21 1600 09/18/21 0500  Weight: 75 kg 75 kg 85.4 kg    Examination: General Appearance: frail, cachectic woman sitting up in bed in NAD, wearing nasal oxygen Head: [place]/AT, eyes anicteric Neck: Supple , No LAD Lungs: Breathing comfortably on nasal cannula, currently on 8 L .  No wheezing. Few crackles noted per L base,  Thick clear blood-tinged sputum. Heart: S1 S2, regular rate and rhythm Abdomen: Soft, nontender, non distended, BS + Extremities: Clubbing, continued improvement  lower extremity edema  skin: Darkening of skin over her left chest, back, warm, dry Neurologic: Fatigued appearing, globally weak.  Alert and appropriate  Na+ 136 K+ 3.6 BUN 13 Cr 0.67 CBC not drawn 10/28, Below are 10/27 results WBC 3.6 H/H 7.6/25.8 Mag is 2.0  She is + 14 L per  her I&O, Will check CXR now, if wet will add gentle diuresis   Resolved Hospital Problem list     Assessment & Plan:   Acute on chronic respiratory failure with hypoxia and hypercapnia- due to acute COPD exacerbation and progressive lung adenocarcinoma.  Her baseline lung function is severely impaired (FEV1 24% predicted in 2021). She has very widespread cancer throughout the left lung. -Continue bronchodilators.  As she gets closer to discharge would plan to send home on triple inhaled therapy-LABA, LABA, ICS.  - Ok to continue to wean prednisone. - Continue diuresis. - BiPAP as needed if she can tolerate - Radiation per rad onc.   Acute encephalopathy with agitated delirium- continues to improve. - Empiric acyclovir x14 days per ID recommendations.  Patient's family previously refused LP. -reorientation, OOB mobility, family visitation -B12 supplements  Acute on chronic pain -fentanyl PRN  SVT, paroxysmal AF, currently NSR  - Appreciate cardiology's management.  Con't amio & heparin.  - tele  Adenocarcinoma of the lung, on chemoradiation- Stage IIIA (T4, N1, M0)  adenocarcinoma - presented with large left upper lobe lung mass with chest wall invasion in addition to left suprahilar lymphadenopathy diagnosed in  August 2022.  Despite treatment she has had progression with rib destruction. -Appreciate Palliative Care team's input. I spoke to Dr. Earlie Server today and he has spoken to Ms. Naef and her family regarding the limitations of her treatment with her weakness and limited functional status and severe respiratory failure.  Chronic anemia, stable Acute thrombocytopenia Acute leukopenia --con't to monitor, trend CBC - Monitor for bleeding -tranfuse for Hb<7 or hemodynamically significant bleeding  Hyponatremia, likely hypervolemic- resolved Hypokalemia, resolved Mild hypomagnesmia, resolved -con't electrolyte monitor  Failure to thrive with > 30# weight  loss PTA Cancer  pain on chronic opioid Severe Protein calorie malnutrition  -Ongoing GOC discussions, appreciate Palliative Care's input. - Con't  Ensure 3 times daily  Poor prognosis given baseline severe COPD and hypoxic respiratory failure, with spreading adenocarcinoma and known hilar nodal disease, now with significant deconditioning and ongoing acute on chronic respiratory failure. Per Dr. Julien Nordmann  her performance status and ongoing respiratory failure  are limiting her candidacy  for chemo with how limited her reserve is.   Her family wants to continue all aggressive care measures and hopes she can still be a candidate for future therapies and wants to work towards rehabilitation. Her son is very motivated for her to achieve this. No changes to her therapy at this time. They understand she needs to be on less oxygen to be able to go home. We did have to increase oxygen overnight from 6 L to 8 L.   Best Practice (right click and "Reselect all SmartList Selections" daily)   Diet/type: Regular consistency (see orders) DVT prophylaxis: other GI prophylaxis: N/A Lines: N/A Foley:  N/A Code Status:  full code Last date of multidisciplinary goals of care discussion [10/27-- son, niece, patient. She remains full code at this time.]   LABS       PULMONARY Recent Labs  Lab 09/15/21 1225 09/15/21 1700 09/17/21 1000  PHART 7.227* 7.348* 7.315*  PCO2ART 90.6* 67.9* 81.5*  PO2ART 97.5 51.8* 56.1*  HCO3 36.3* 36.4* 40.3*  O2SAT 97.3 85.8 87.8    CBC Recent Labs  Lab 09/19/21 0513 09/20/21 0237 09/21/21 0507  HGB 8.0* 7.3* 7.6*  HCT 27.3* 24.5* 25.8*  WBC 3.0* 3.4* 3.6*  PLT 131* 179 146*    COAGULATION No results for input(s): INR in the last 168 hours.   CARDIAC  No results for input(s): TROPONINI in the last 168 hours. No results for input(s): PROBNP in the last 168 hours.   CHEMISTRY Recent Labs  Lab 09/17/21 0420 09/18/21 0228 09/19/21 0513 09/20/21 0237 09/20/21 1852  09/21/21 0507 09/22/21 0223  NA 132* 134* 135 133* 137 138 136  K 3.7 3.3* 3.6 3.0* 3.8 3.7 3.6  CL 89* 88* 83* 79* 81* 80* 79*  CO2 35* 40* 46* >45* >45* >45* >45*  GLUCOSE 105* 91 118* 132* 139* 92 138*  BUN 6* <5* 5* _0 CREATININE 0.51 0.55 0.60 0.76 0.47 0.54 0.67  CALCIUM 8.4* 8.2* 8.3* 7.7* 8.6* 8.4* 8.3*  MG 1.9 2.0 1.5* 1.4* 2.2 1.8 2.0  PHOS 3.0 2.5  --   --   --   --   --    Estimated Creatinine Clearance: 84 mL/min (by C-G formula based on SCr of 0.67 mg/dL).   LIVER Recent Labs  Lab 09/17/21 0420 09/18/21 0228  AST 10* 11*  ALT 8 7  ALKPHOS 71 68  BILITOT 0.4 0.3  PROT 6.1* 5.8*  ALBUMIN 2.1* 2.0*    Critical  Care Time : 45 minutes  Magdalen Spatz, MSN, AGACNP-BC [place] See Amion for personal pager PCCM on call pager [phone]  [place]

## 2021-09-22 NOTE — Progress Notes (Signed)
  Radiation Oncology         [phone]   Name: Patricia Sampson MRN: [id number]   Date: 09/09/2021  DOB: [birth year]  Inpatient   Weekly Radiation Therapy Management  To echo the findings described by Shona Simpson, this patient is responding favorably to radiation to the primary tumor and lymph nodes.  I put CT sim imaging and today's cone beam CT imaging below with representative images.  Pre-Radiation Sagittal  11 cm    Current Sagittal 8 cm   Pre-Radiation Axial 8 cm    Current Axial 6 cm     Current Dose: 56 Gy  Planned Dose:  66 Gy  Narrative The patient presents for routine under treatment assessment. She remains hospitalized for possible sepsis, COPD exacerbation.. Set-up films were reviewed. The chart was checked.  Physical Findings  height is _0  (1.753 m) and weight is 188 lb 4.4 oz (85.4 kg). Her oral temperature is 98 F (36.7 C). Her blood pressure is 108/61 and her pulse is 97. Her respiration is 16 and oxygen saturation is 95%. .   Impression The patient is tolerating radiation.  She is responding well radiographically with tumor shrinking, no significant pleural effusions and no overt airspace disease.  Plan Continue treatment as planned. Her COPD and poor performance status at baseline remain very concerning about her prognosis.  Appreciate her inpatient care.         Sheral Apley Tammi Klippel, M.D.

## 2021-09-22 NOTE — Plan of Care (Addendum)
Discussed with patient in front of family plan of care for the evening, pain management and CPAP with some teach back displayed.  Problem: Education: Goal: Knowledge of General Education information will improve Description: Including pain rating scale, medication(s)/side effects and non-pharmacologic comfort measures Outcome: Progressing

## 2021-09-23 DIAGNOSIS — D649 Anemia, unspecified: Secondary | ICD-10-CM

## 2021-09-23 DIAGNOSIS — J9622 Acute and chronic respiratory failure with hypercapnia: Secondary | ICD-10-CM | POA: Diagnosis not present

## 2021-09-23 DIAGNOSIS — N179 Acute kidney failure, unspecified: Secondary | ICD-10-CM

## 2021-09-23 DIAGNOSIS — R6510 Systemic inflammatory response syndrome (SIRS) of non-infectious origin without acute organ dysfunction: Secondary | ICD-10-CM | POA: Diagnosis not present

## 2021-09-23 DIAGNOSIS — J9621 Acute and chronic respiratory failure with hypoxia: Secondary | ICD-10-CM | POA: Diagnosis not present

## 2021-09-23 LAB — CBC WITH DIFFERENTIAL/PLATELET
Abs Immature Granulocytes: 0.22 10*3/uL — ABNORMAL HIGH (ref 0.00–0.07)
Basophils Absolute: 0 10*3/uL (ref 0.0–0.1)
Basophils Relative: 0 %
Eosinophils Absolute: 0 10*3/uL (ref 0.0–0.5)
Eosinophils Relative: 0 %
HCT: 24.8 % — ABNORMAL LOW (ref 36.0–46.0)
Hemoglobin: 7 g/dL — ABNORMAL LOW (ref 12.0–15.0)
Immature Granulocytes: 5 %
Lymphocytes Relative: 5 %
Lymphs Abs: 0.3 10*3/uL — ABNORMAL LOW (ref 0.7–4.0)
MCH: 25.3 pg — ABNORMAL LOW (ref 26.0–34.0)
MCHC: 28.2 g/dL — ABNORMAL LOW (ref 30.0–36.0)
MCV: 89.5 fL (ref 80.0–100.0)
Monocytes Absolute: 0.6 10*3/uL (ref 0.1–1.0)
Monocytes Relative: 13 %
Neutro Abs: 3.7 10*3/uL (ref 1.7–7.7)
Neutrophils Relative %: 77 %
Platelets: 161 10*3/uL (ref 150–400)
RBC: 2.77 MIL/uL — ABNORMAL LOW (ref 3.87–5.11)
RDW: 19.4 % — ABNORMAL HIGH (ref 11.5–15.5)
WBC: 4.9 10*3/uL (ref 4.0–10.5)
nRBC: 0.4 % — ABNORMAL HIGH (ref 0.0–0.2)

## 2021-09-23 LAB — PREPARE RBC (CROSSMATCH)

## 2021-09-23 LAB — BASIC METABOLIC PANEL
BUN: 14 mg/dL (ref 8–23)
CO2: >45 mmol/L — ABNORMAL HIGH (ref 22–32)
Calcium: 8.2 mg/dL — ABNORMAL LOW (ref 8.9–10.3)
Chloride: 80 mmol/L — ABNORMAL LOW (ref 98–111)
Creatinine, Ser: 1.03 mg/dL — ABNORMAL HIGH (ref 0.44–1.00)
GFR, Estimated: >60 mL/min (ref >60)
Glucose, Bld: 102 mg/dL — ABNORMAL HIGH (ref 70–99)
Potassium: 3.7 mmol/L (ref 3.5–5.1)
Sodium: 135 mmol/L (ref 135–145)

## 2021-09-23 LAB — HEMOGLOBIN AND HEMATOCRIT, BLOOD
HCT: 25.5 % — ABNORMAL LOW (ref 36.0–46.0)
Hemoglobin: 7.6 g/dL — ABNORMAL LOW (ref 12.0–15.0)

## 2021-09-23 LAB — MAGNESIUM: Magnesium: 2 mg/dL (ref 1.7–2.4)

## 2021-09-23 MED ORDER — SODIUM CHLORIDE 0.9% IV SOLUTION: Freq: Once | INTRAVENOUS | Status: AC

## 2021-09-23 NOTE — Progress Notes (Signed)
Physical Therapy Treatment Patient Details Name: Patricia Sampson MRN: [id number] DOB: Jan 29, 1956 Today's Date: 09/23/2021   History of Present Illness Patricia Sampson is a 65 y.o. female with PMH Stage IIIa LUL adenocarcinoma, chronic hypoxic respiratory failure 6L O2 at home, HTN, HLD who presented with AMS 08/31/2021,    PT Comments    Pt seen in ICU with spouse at bedside.  General Comments: AxO x 3 very sweet lady but also nervous and anxious about getting OOB.  Pt on 8 lts nasal with HR 108 and BP 112/45 with RR 20.  Only c/o B foot pain "Neuropathy".  Assisted OOB was difficult.  General bed mobility comments: pt self able to rise and sit EOB with use of rail and increased time.  MAX c/o dizziness when seated EOB.  BP decreased from 112/45 to 99/44 with sats 93% on 8 lts nasal cannula.  Allowed increased time static sitting with MinGuard Assist.  Pt requesting to use BSC.General transfer comment: + 2 side by side HHA due to dizziness.  Pt was able to self rise at [place] and complete 1/4 turn to [facility] with 50% VC's on proper hand placement with stand to sit.  Assisted pt with peri care and switched BSC with recliner from behind without stepping due to hypotension associated with dizziness.  HgB 7.0   Also sats decreased toi 85% on 8 lts so increased to 10 lts to achieve sats >88%.  Reported to RN increased oxygen demand. Positioned in recliner to comfort.   Family hopes to take pt home under Hospice care.    Recommendations for follow up therapy are one component of a multi-disciplinary discharge planning process, led by the attending physician.  Recommendations may be updated based on patient status, additional functional criteria and insurance authorization.  Follow Up Recommendations  Home health PT     Assistance Recommended at Discharge Frequent or constant Supervision/Assistance  Equipment Recommendations  Wheelchair (measurements PT);Wheelchair cushion (measurements PT)     Recommendations for Other Services       Precautions / Restrictions Precautions Precautions: Fall Precaution Comments: monitor vitals     Mobility  Bed Mobility Overal bed mobility: Needs Assistance Bed Mobility: Supine to Sit     Supine to sit: Min guard;HOB elevated;+2 for safety/equipment     General bed mobility comments: pt self able to rise and sit EOB with use of rail and increased time.  MAX c/o dizziness when seated EOB.  BP decreased from 112/45 to 99/44 with sats 93% on 8 lts nasal cannula.  Allowed increased time static sitting with MinGuard Assist.  Pt requesting to use BSC.    Transfers Overall transfer level: Needs assistance Equipment used: 2 person hand held assist Transfers: Stand Pivot Transfers Sit to Stand: +2 physical assistance;+2 safety/equipment;Mod assist Stand pivot transfers: Mod assist;+2 physical assistance         General transfer comment: + 2 side by side HHA due to dizziness.  Pt was able to self rise at [place] and complete 1/4 turn to [facility] with 50% VC's on proper hand placement with stand to sit.  Assisted pt with peri care and switched BSC with recliner from behind without stepping due to hypotension associated with dizziness.  HgB 7.0   Also sats decreased toi 85% on 8 lts so increased to 10 lts to achieve sats >88%.  Reported to RN increased oxygen demand.    Ambulation/Gait  General Gait Details: did not attempt amb due to low HgB and max c/o dizziness   Stairs             Wheelchair Mobility    Modified Rankin (Stroke Patients Only)       Balance                                            Cognition Arousal/Alertness: Awake/alert Behavior During Therapy: WFL for tasks assessed/performed Overall Cognitive Status: Within Functional Limits for tasks assessed                                 General Comments: AxO x 3 very sweet lady but also nervous and anxious  about getting OOB        Exercises      General Comments        Pertinent Vitals/Pain Pain Assessment: No/denies pain    Home Living                          Prior Function            PT Goals (current goals can now be found in the care plan section) Progress towards PT goals: Progressing toward goals    Frequency    Min 3X/week      PT Plan Current plan remains appropriate    Co-evaluation              AM-PAC PT "6 Clicks" Mobility   Outcome Measure  Help needed turning from your back to your side while in a flat bed without using bedrails?: A Lot Help needed moving from lying on your back to sitting on the side of a flat bed without using bedrails?: A Lot Help needed moving to and from a bed to a chair (including a wheelchair)?: A Lot Help needed standing up from a chair using your arms (e.g., wheelchair or bedside chair)?: A Lot Help needed to walk in hospital room?: A Lot Help needed climbing 3-5 steps with a railing? : Total 6 Click Score: 11    End of Session Equipment Utilized During Treatment: Gait belt;Oxygen Activity Tolerance: Patient limited by fatigue;Other (comment) (hypotensive) Patient left: in chair;with chair alarm set;with family/visitor present;with call bell/phone within reach Nurse Communication: Mobility status (increased O2 from 8 lts to 10 lts) PT Visit Diagnosis: Unsteadiness on feet (R26.81);Muscle weakness (generalized) (M62.81)     Time: 1000-1028 PT Time Calculation (min) (ACUTE ONLY): 28 min  Charges:  $Therapeutic Activity: 23-37 mins                     {Adelayde Minney  PTA Acute  Rehabilitation Services Pager      [phone] Office      [phone]

## 2021-09-23 NOTE — Assessment & Plan Note (Addendum)
Multifactorial etiology from underlying chronic illness, malignancy. Patient required 2 units of PRBC.

## 2021-09-23 NOTE — Progress Notes (Signed)
Administered 1- Unit of PRBC after obtaining consent and explaining to patient the providers orders. 2 hours post transfusion completion Labs were collected and sent.

## 2021-09-23 NOTE — Progress Notes (Signed)
RT came back again to check on pt. Pt was already off of her bipap and on 8L [place] resting well. No resp distress noted.

## 2021-09-23 NOTE — Assessment & Plan Note (Addendum)
Baseline creatinine of 0.7. Peak of 1.50. Concern this was multifactorial in setting of hypotension, acyclovir, poor oral intake. Overall, function remained stable and mildly elevated even with IV fluids.

## 2021-09-23 NOTE — Progress Notes (Signed)
Progress Note    Patricia Sampson   JOI:[id number]  DOB: 09/23/1956  DOA: 09/22/2021     12 Date of Service: 09/23/2021   Clinical Course Patricia Sampson is a 65 y.o. female with PMH Stage IIIa LUL adenocarcinoma, chronic hypoxic respiratory failure 6L O2 at home, HTN, HLD who presented with AMS. She underwent extensive workup but ultimately an LP was declined. She was treated empirically with abx and acyclovir for encephalitis and her mentation improved.   Assessment and Plan * SIRS (systemic inflammatory response syndrome) (HCC)-resolved as of 09/22/2021 - no clear source ever presented; evaluated by ID as well; LP not pursued per family wish - s/p rocephin and acyclovir - continues on monotherapy acyclovir to complete total of 14 days per ID  Acute metabolic encephalopathy-resolved as of 09/20/2021 - presented with AMS and has slowly improved during hospitalization  - now AO (name, place, year)  Acute on chronic respiratory failure with hypoxia and hypercapnia (HCC) - on 6L O2 at home via concentrator and increased with activity - down to ~8L - continue weaning O2 back down and turn up with activity as needed  AKI (acute kidney injury) ([place]) - baseline creatinine ~ 0.7 - patient presents with increase in creat >0.3 mg/dL above baseline, creat increase >1.5x baseline presumed to have occurred within past 7 days PTA -Multifactorial etiology from hypoxia, hyopTN, possible acyclovir, poor intake at times -Given hypoalbuminemia and respiratory issues, fluids have been deferred for the last few days - For now, since hemoglobin has down trended today, will volume expand first with blood and follow-up repeat creatinine tomorrow.  If fluids felt to be indicated at that time with benefit outweighing risk, will initiate then   Normocytic anemia - Multifactorial etiology from underlying chronic illness, malignancy - Hemoglobin down to 7 g/dL this morning, will transfuse 1 unit PRBC  today  Adenocarcinoma of left lung, stage 3 (HCC) Stage IIIA (T4, N1, M0) non-small cell lung cancer, adenocarcinoma presented with large left upper lobe lung mass with chest wall invasion in addition to left suprahilar lymphadenopathy diagnosed in August 2022  - follows with Dr. Lisbeth Renshaw and Dr. Julien Nordmann - palliative care also following; currently full scope of care per family and patient  - undergoing daily radiation still -No further chemo being offered per oncology due to overall poor performance status.  Recommendation is for patient and family to consider palliative care/hospice however second opinion being considered; would be outpatient referral - low physical reserve; concern is for lack of ability to tolerate   PAF (paroxysmal atrial fibrillation) ([place]) - followed by cardiology during hospitalization as well; likely precipitated by SIRS/infection/hospitalization  - heparin transitioned to Eliquis on 10/26 - continue amiodarone  - rec's are for 30 day outpt event monitor and if no recurrence of afib outpatient then possibility of stopping treatment   COPD (chronic obstructive pulmonary disease) ([place]) - continue O2 and breathing treatments -Steroid taper being initiated per pulmonology and arranging for nightly BiPAP   Subjective:  No events overnight. Stood and transferred to chair today with PT but her BP was low and she did not walk far.  She consents to blood transfusion. Husband present bedside.   Objective Vitals:   09/23/21 0800 09/23/21 0807 09/23/21 1130 09/23/21 1300  BP:    112/76  Pulse:    96  Resp:      Temp: 98.2 F (36.8 C)  97.9 F (36.6 C) 98.1 F (36.7 C)  TempSrc: Oral  Oral Oral  SpO2:  99%  100%  Weight:      Height:       85.4 kg  Vital signs were reviewed and unremarkable.   Exam Physical Exam Constitutional:      General: She is not in acute distress.    Appearance: Normal appearance.  HENT:     Head: Normocephalic and atraumatic.      Mouth/Throat:     Mouth: Mucous membranes are moist.  Eyes:     Extraocular Movements: Extraocular movements intact.  Cardiovascular:     Rate and Rhythm: Normal rate and regular rhythm.  Pulmonary:     Effort: Pulmonary effort is normal.     Breath sounds: Normal breath sounds.  Abdominal:     General: Bowel sounds are normal. There is no distension.     Palpations: Abdomen is soft.     Tenderness: There is no abdominal tenderness.  Musculoskeletal:        General: Normal range of motion.     Cervical back: Normal range of motion and neck supple.  Skin:    General: Skin is warm and dry.  Neurological:     General: No focal deficit present.     Mental Status: She is alert.  Psychiatric:        Mood and Affect: Mood normal.        Behavior: Behavior normal.     Labs / Other Information My review of labs, imaging, notes and other tests is significant for Hgb 7g/dL     Disposition Plan: Status is: Inpatient  Remains inpatient appropriate because: treatment of above     Time spent: Greater than 50% of the 35 minute visit was spent in counseling/coordination of care for the patient as laid out in the A&P.  Dwyane Dee, MD [facility] 09/23/2021, 1:07 PM

## 2021-09-24 DIAGNOSIS — R6510 Systemic inflammatory response syndrome (SIRS) of non-infectious origin without acute organ dysfunction: Secondary | ICD-10-CM | POA: Diagnosis not present

## 2021-09-24 DIAGNOSIS — N179 Acute kidney failure, unspecified: Secondary | ICD-10-CM

## 2021-09-24 DIAGNOSIS — C3492 Malignant neoplasm of unspecified part of left bronchus or lung: Secondary | ICD-10-CM | POA: Diagnosis not present

## 2021-09-24 DIAGNOSIS — G9341 Metabolic encephalopathy: Secondary | ICD-10-CM | POA: Diagnosis not present

## 2021-09-24 DIAGNOSIS — J9621 Acute and chronic respiratory failure with hypoxia: Secondary | ICD-10-CM | POA: Diagnosis not present

## 2021-09-24 LAB — BASIC METABOLIC PANEL
BUN: 14 mg/dL (ref 8–23)
CO2: >45 mmol/L — ABNORMAL HIGH (ref 22–32)
Calcium: 7.7 mg/dL — ABNORMAL LOW (ref 8.9–10.3)
Chloride: 85 mmol/L — ABNORMAL LOW (ref 98–111)
Creatinine, Ser: 1.25 mg/dL — ABNORMAL HIGH (ref 0.44–1.00)
GFR, Estimated: 48 mL/min — ABNORMAL LOW (ref >60)
Glucose, Bld: 85 mg/dL (ref 70–99)
Potassium: 3.5 mmol/L (ref 3.5–5.1)
Sodium: 137 mmol/L (ref 135–145)

## 2021-09-24 LAB — CBC WITH DIFFERENTIAL/PLATELET
Abs Immature Granulocytes: 0.18 10*3/uL — ABNORMAL HIGH (ref 0.00–0.07)
Basophils Absolute: 0 10*3/uL (ref 0.0–0.1)
Basophils Relative: 0 %
Eosinophils Absolute: 0 10*3/uL (ref 0.0–0.5)
Eosinophils Relative: 1 %
HCT: 24.2 % — ABNORMAL LOW (ref 36.0–46.0)
Hemoglobin: 7.1 g/dL — ABNORMAL LOW (ref 12.0–15.0)
Immature Granulocytes: 4 %
Lymphocytes Relative: 4 %
Lymphs Abs: 0.2 10*3/uL — ABNORMAL LOW (ref 0.7–4.0)
MCH: 25.9 pg — ABNORMAL LOW (ref 26.0–34.0)
MCHC: 29.3 g/dL — ABNORMAL LOW (ref 30.0–36.0)
MCV: 88.3 fL (ref 80.0–100.0)
Monocytes Absolute: 0.6 10*3/uL (ref 0.1–1.0)
Monocytes Relative: 11 %
Neutro Abs: 4.1 10*3/uL (ref 1.7–7.7)
Neutrophils Relative %: 80 %
Platelets: 139 10*3/uL — ABNORMAL LOW (ref 150–400)
RBC: 2.74 MIL/uL — ABNORMAL LOW (ref 3.87–5.11)
RDW: 19.8 % — ABNORMAL HIGH (ref 11.5–15.5)
WBC: 5.1 10*3/uL (ref 4.0–10.5)
nRBC: 0 % (ref 0.0–0.2)

## 2021-09-24 LAB — PREPARE RBC (CROSSMATCH)

## 2021-09-24 LAB — MAGNESIUM: Magnesium: 1.9 mg/dL (ref 1.7–2.4)

## 2021-09-24 LAB — HEMOGLOBIN AND HEMATOCRIT, BLOOD
HCT: 29.1 % — ABNORMAL LOW (ref 36.0–46.0)
Hemoglobin: 8.7 g/dL — ABNORMAL LOW (ref 12.0–15.0)

## 2021-09-24 MED ORDER — SODIUM CHLORIDE 0.9% IV SOLUTION: Freq: Once | INTRAVENOUS | Status: AC

## 2021-09-24 MED ORDER — IPRATROPIUM-ALBUTEROL 0.5-2.5 (3) MG/3ML IN SOLN
3.0000 mL | Freq: Three times a day (TID) | RESPIRATORY_TRACT | Status: DC
  Administered 2021-09-24 – 2021-09-30 (×21): 3 mL via RESPIRATORY_TRACT
  Filled 2021-09-24 (×22): qty 3

## 2021-09-24 NOTE — Progress Notes (Signed)
Progress Note    Patricia Sampson   JME:[id number]  DOB: 1956-10-14  DOA: 09/06/2021     13 Date of Service: 09/24/2021   Clinical Course Patricia Sampson is a 65 y.o. female with PMH Stage IIIa LUL adenocarcinoma, chronic hypoxic respiratory failure 6L O2 at home, HTN, HLD who presented with AMS. She underwent extensive workup but ultimately an LP was declined. She was treated empirically with abx and acyclovir for encephalitis and her mentation improved.    Assessment and Plan * SIRS (systemic inflammatory response syndrome) (HCC)-resolved as of 09/22/2021 - no clear source ever presented; evaluated by ID as well; LP not pursued per family wish - s/p rocephin and acyclovir - continues on monotherapy acyclovir to complete total of 14 days per ID  Acute metabolic encephalopathy-resolved as of 09/20/2021 - presented with AMS and has slowly improved during hospitalization  - now AO (name, place, year)  Acute on chronic respiratory failure with hypoxia and hypercapnia (HCC) - on 6L O2 at home via concentrator and increased with activity - down to ~8L - continue weaning O2 back down and turn up with activity as needed  AKI (acute kidney injury) ([place]) - baseline creatinine ~ 0.7 - patient presents with increase in creat >0.3 mg/dL above baseline, creat increase >1.5x baseline presumed to have occurred within past 7 days PTA -Multifactorial etiology from hypoxia, hyopTN, possible acyclovir, poor intake at times -Given hypoalbuminemia and respiratory issues, fluids have been deferred for the last few days - For now, since hemoglobin has down trended, will volume expand first with blood and follow-up repeat creatinine tomorrow.  If fluids felt to be indicated at that time with benefit outweighing risk, will initiate then   Normocytic anemia - Multifactorial etiology from underlying chronic illness, malignancy - Hemoglobin down to 7 g/dL on 10/29; s/p 1 unit PRBC - Hgb still low, 7.1  g/dL this morning; will order 1 unit PRBC  Adenocarcinoma of left lung, stage 3 (HCC) Stage IIIA (T4, N1, M0) non-small cell lung cancer, adenocarcinoma presented with large left upper lobe lung mass with chest wall invasion in addition to left suprahilar lymphadenopathy diagnosed in August 2022  - follows with Dr. Lisbeth Renshaw and Dr. Julien Nordmann - palliative care also following; currently full scope of care per family and patient  - undergoing daily radiation still -No further chemo being offered per oncology due to overall poor performance status.  Recommendation is for patient and family to consider palliative care/hospice however second opinion being considered; would be outpatient referral - low physical reserve; concern is for lack of ability to tolerate   PAF (paroxysmal atrial fibrillation) ([place]) - followed by cardiology during hospitalization as well; likely precipitated by SIRS/infection/hospitalization  - heparin transitioned to Eliquis on 10/26 - continue amiodarone  - rec's are for 30 day outpt event monitor and if no recurrence of afib outpatient then possibility of stopping treatment   COPD (chronic obstructive pulmonary disease) ([place]) - continue O2 and breathing treatments -Steroid taper being initiated per pulmonology and arranging for nightly BiPAP     Subjective:  No events overnight.  Son present bedside this morning.  We had a long discussion about what her goals are for mobility and being able to go home.  Patient understands she needs to continue working with physical therapy on achieving being able to walk roughly the same distances she was doing at home to get to various parts of the house.  Objective Vitals:   09/24/21 1040 09/24/21 1102 09/24/21  1212 09/24/21 1258  BP: 118/69 (!) 120/59  (!) 112/52  Pulse: (!) 106 95  100  Resp: (!) 28 18  (!) 21  Temp: 97.8 F (36.6 C) 98 F (36.7 C) 98.4 F (36.9 C) 98.1 F (36.7 C)  TempSrc: Oral  Axillary Oral  SpO2:    96%   Weight:      Height:       85.4 kg  Vital signs were reviewed and unremarkable.   Exam Physical Exam Constitutional:      General: She is not in acute distress.    Appearance: Normal appearance.  HENT:     Head: Normocephalic and atraumatic.     Mouth/Throat:     Mouth: Mucous membranes are moist.  Eyes:     Extraocular Movements: Extraocular movements intact.  Cardiovascular:     Rate and Rhythm: Normal rate and regular rhythm.  Pulmonary:     Effort: Pulmonary effort is normal.     Breath sounds: Normal breath sounds.  Abdominal:     General: Bowel sounds are normal. There is no distension.     Palpations: Abdomen is soft.     Tenderness: There is no abdominal tenderness.  Musculoskeletal:        General: Normal range of motion.     Cervical back: Normal range of motion and neck supple.  Skin:    General: Skin is warm and dry.  Neurological:     General: No focal deficit present.     Mental Status: She is alert.  Psychiatric:        Mood and Affect: Mood normal.        Behavior: Behavior normal.     Labs / Other Information My review of labs, imaging, notes and other tests is significant for Hgb 7.1 g/dL     Disposition Plan: Status is: Inpatient  Remains inpatient appropriate because: Treatment of above      Time spent: Greater than 50% of the 35 minute visit was spent in counseling/coordination of care for the patient as laid out in the A&P.  Dwyane Dee, MD [facility] 09/24/2021, 1:14 PM

## 2021-09-25 ENCOUNTER — Ambulatory Visit: Payer: 59

## 2021-09-25 ENCOUNTER — Other Ambulatory Visit: Payer: 59

## 2021-09-25 ENCOUNTER — Ambulatory Visit
Admission: RE | Admit: 2021-09-25 | Discharge: 2021-09-25 | Disposition: A | Discharge status: Home / Self Care | Payer: 59 | Source: Ambulatory Visit | Attending: Radiation Oncology | Admitting: Radiation Oncology

## 2021-09-25 DIAGNOSIS — R6510 Systemic inflammatory response syndrome (SIRS) of non-infectious origin without acute organ dysfunction: Secondary | ICD-10-CM | POA: Diagnosis not present

## 2021-09-25 DIAGNOSIS — J9622 Acute and chronic respiratory failure with hypercapnia: Secondary | ICD-10-CM | POA: Diagnosis not present

## 2021-09-25 DIAGNOSIS — C3492 Malignant neoplasm of unspecified part of left bronchus or lung: Secondary | ICD-10-CM | POA: Diagnosis not present

## 2021-09-25 DIAGNOSIS — J9621 Acute and chronic respiratory failure with hypoxia: Secondary | ICD-10-CM | POA: Diagnosis not present

## 2021-09-25 LAB — MAGNESIUM: Magnesium: 1.8 mg/dL (ref 1.7–2.4)

## 2021-09-25 LAB — BLOOD GAS, ARTERIAL
Acid-Base Excess: 17.7 mmol/L — ABNORMAL HIGH (ref 0.0–2.0)
Bicarbonate: 45.4 mmol/L — ABNORMAL HIGH (ref 20.0–28.0)
Drawn by: 22052
FIO2: 40
O2 Content: 5 L/min
O2 Saturation: 90.1 %
Patient temperature: 98.6
pCO2 arterial: 76.9 mmHg (ref 32.0–48.0)
pH, Arterial: 7.389 (ref 7.350–7.450)
pO2, Arterial: 58.4 mmHg — ABNORMAL LOW (ref 83.0–108.0)

## 2021-09-25 LAB — TYPE AND SCREEN
ABO/RH(D): O POS
Antibody Screen: NEGATIVE
Unit division: 0
Unit division: 0

## 2021-09-25 LAB — BPAM RBC
Blood Product Expiration Date: 202211242359
Blood Product Expiration Date: 202211282359
ISSUE DATE / TIME: 202210291308
ISSUE DATE / TIME: 202210301037
Unit Type and Rh: 5100
Unit Type and Rh: 5100

## 2021-09-25 LAB — CBC WITH DIFFERENTIAL/PLATELET
Abs Immature Granulocytes: 0.13 10*3/uL — ABNORMAL HIGH (ref 0.00–0.07)
Basophils Absolute: 0 10*3/uL (ref 0.0–0.1)
Basophils Relative: 0 %
Eosinophils Absolute: 0 10*3/uL (ref 0.0–0.5)
Eosinophils Relative: 1 %
HCT: 27 % — ABNORMAL LOW (ref 36.0–46.0)
Hemoglobin: 8 g/dL — ABNORMAL LOW (ref 12.0–15.0)
Immature Granulocytes: 2 %
Lymphocytes Relative: 4 %
Lymphs Abs: 0.2 10*3/uL — ABNORMAL LOW (ref 0.7–4.0)
MCH: 26.1 pg (ref 26.0–34.0)
MCHC: 29.6 g/dL — ABNORMAL LOW (ref 30.0–36.0)
MCV: 88.2 fL (ref 80.0–100.0)
Monocytes Absolute: 0.6 10*3/uL (ref 0.1–1.0)
Monocytes Relative: 11 %
Neutro Abs: 4.5 10*3/uL (ref 1.7–7.7)
Neutrophils Relative %: 82 %
Platelets: 135 10*3/uL — ABNORMAL LOW (ref 150–400)
RBC: 3.06 MIL/uL — ABNORMAL LOW (ref 3.87–5.11)
RDW: 18.9 % — ABNORMAL HIGH (ref 11.5–15.5)
WBC: 5.5 10*3/uL (ref 4.0–10.5)
nRBC: 0 % (ref 0.0–0.2)

## 2021-09-25 LAB — BASIC METABOLIC PANEL
BUN: 19 mg/dL (ref 8–23)
CO2: >45 mmol/L — ABNORMAL HIGH (ref 22–32)
Calcium: 7.7 mg/dL — ABNORMAL LOW (ref 8.9–10.3)
Chloride: 85 mmol/L — ABNORMAL LOW (ref 98–111)
Creatinine, Ser: 1.42 mg/dL — ABNORMAL HIGH (ref 0.44–1.00)
GFR, Estimated: 42 mL/min — ABNORMAL LOW (ref >60)
Glucose, Bld: 80 mg/dL (ref 70–99)
Potassium: 3.8 mmol/L (ref 3.5–5.1)
Sodium: 137 mmol/L (ref 135–145)

## 2021-09-25 MED ORDER — MAGNESIUM SULFATE 2 GM/50ML IV SOLN
2.0000 g | Freq: Once | INTRAVENOUS | Status: AC
  Administered 2021-09-25: 2 g via INTRAVENOUS
  Filled 2021-09-25: qty 50

## 2021-09-25 MED ORDER — SODIUM CHLORIDE 0.9 % IV SOLN: INTRAVENOUS | Status: AC

## 2021-09-25 NOTE — Progress Notes (Signed)
ANTICOAGULATION CONSULT NOTE - follow up  Pharmacy Consult for Eliquis Indication: atrial fibrillation  Allergies  Allergen Reactions   Dyazide [Hydrochlorothiazide W-Triamterene]     hives   Zestoretic [Lisinopril-Hydrochlorothiazide]     Swelling     Patient Measurements: Height: _0  (175.3 cm) Weight: 85.4 kg (188 lb 4.4 oz) IBW/kg (Calculated) : 66.2 Heparin Dosing Weight: 74.6 kg  Vital Signs: Temp: 98.1 F (36.7 C) (10/31 0818) Temp Source: Axillary (10/31 0818) BP: 123/55 (10/31 0542) Pulse Rate: 101 (10/31 0700)  Labs: Recent Labs    09/23/21 0310 09/23/21 2152 09/24/21 0458 09/24/21 2040 09/25/21 0316  HGB 7.0*   < > 7.1* 8.7* 8.0*  HCT 24.8*   < > 24.2* 29.1* 27.0*  PLT 161  --  139*  --  135*  CREATININE 1.03*  --  1.25*  --  1.42*   < > = values in this interval not displayed.     Estimated Creatinine Clearance: 47.3 mL/min (A) (by C-G formula based on SCr of 1.42 mg/dL (H)).    Medications: Infusions:   sodium chloride 10 mL/hr at 09/22/21 0559   sodium chloride Stopped (09/18/21 0523)   sodium chloride 50 mL/hr at 09/25/21 0829   acyclovir (ZOVIRAX) [phone] mg IVPB 745 mg (09/25/21 0830)    Assessment: 65 yo female with new atrial fibrillation/flutter.  No history of anticoagulation PTA and none received this admit.  Patient case discussed with cardiology who recommended starting patient on heparin IV.  Baseline aPTT wnl at 32 seconds and INR 1.1.  Patient is pancytopenic likely due to chemo/radiation therapy for lung adenocarcinoma.  Pharmacy consulted to dose heparin, transitioned to eliquis 10/26.    Today, 09/25/21 -Hgb improved 8.0 s/p transfusion 10/30 -No s/sx bleeding reported.   -SCr rising but still meets criteria for standard dose  Goal of Therapy:  Full dose anticoagulation, prevention of VTE/stroke   Plan:  Continue Apixaban 5 mg PO BID Patient has been provided education and 30 day coupon voucher No dose adjustments  needed, Pharmacy will sign-off  Peggyann Juba, PharmD, BCPS [facility] main pharmacy [phone] 09/25/2021 9:04 AM

## 2021-09-25 NOTE — Progress Notes (Addendum)
NAME:  Patricia Sampson, MRN:  [id number], DOB:  June 05, 1956, LOS: 104 ADMISSION DATE:  09/01/2021, CONSULTATION DATE: 09/20/2021 REFERRING MD: Dr. Marylyn Ishihara, CHIEF COMPLAINT: Encephalopathy  BRIEF  65 year old woman with very severe COPD, associated chronic hypoxemic respiratory failure (4-8 L/min), hypertension, hyperlipidemia.  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 She was noted to have altered mental status on the morning of 10/17, appeared to be normal when she went to bed the night before.  In the emergency department she was tachycardic, tachypneic with pancytopenia.  Last chemotherapy infusion appears to have been 09/05/2021.  Acute metabolic encephalopathy.  She was treated empirically for possible sepsis, source unknown. Home med list with Percocet, Neurontin.  Her husband notes that he does not believe she is been taking any of her medicines for the last 2 to 3 days.   Pertinent  Medical History   Past Medical History:  Diagnosis Date   Angioedema    COPD (chronic obstructive pulmonary disease) (HCC)    FHx: migraine headaches    Hyperlipidemia    Hypertension    Seasonal allergies     Significant Hospital Events: Including procedures, antibiotic start and stop dates in addition to other pertinent events   CT abdomen pelvis 10/17 >> no acute findings Head CT 10/17 >> normal CT-PA chest 10/17 >> no new infiltrates or significant change in the left upper lobe mass.  No PE MRI brain 10/17 >> Motion degradation compromise interpretation, no acute or subacute infarct, no mass lesion  Progressive left upper lobe primary bronchogenic carcinoma with increased destruction of the anterior left second rib. New perilymphatic nodularity in the left upper lobe adjacent to the mass, consistent with lymphangitic carcinomatosis. 3. 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  EEG 10/18 with  generalized slowing, no evidence of active seizures or epileptiform activity 10/18 acyclovir and ampicillin added to cover possible meningitis 10/19 SVT, likely A. fib/flutter noted after adenosine given.  Hypotension with diltiazem, then started amiodarone.  Back in sinus tachycardia 10/20 - SVT with what looks like A. fib/flutter after adenosine given early evening 10/19.  No real response to diltiazem infusion, then given amiodarone.  Back in sinus tachycardia, . \atient continued to be aphasic and to have agitation through the day 10/19.  Now off Precedex. I/O positive 8.7 L total. Has had hypokalemia, replaced. WBC increasing, 2.9 this morning 10/20  10/21 -husband and son at the bedside.  They tell me that after she had COVID in 2020 she has been on 6 L oxygen.  For the last 3 months before admissions she has had 35 pound weight loss approximately.  August 2022 was diagnosed with cancers in September 2022 was on chemoradiation.  Since chemoradiation there is more failure to thrive with progressive decrease in activity particularly after chemoradiation cycles. Currently on 10 L nasal cannula pulse ox 95%.  Has not been on BiPAP.  Has not been on the ventilator.  Is not on pressors.  Not on Precedex  10/22 -had severe worsening of respiratory acidosis acute on chronic with a pH of 7.22 and a PCO2 of 90 associated with worsening bilateral lower lobe atelectasis [has left upper lobe cancer] on chest x-ray compared to 09/21/2021.  Treated with BiPAP.  Blood gas subsequent improvement but did not tolerate BiPAP.  Nonetheless mental status today better.  Husband also acknowledges that she is looking better.  Cachectic elderly female.  Significant physical deconditioning .  currently on 6 L nasal cannula pulse ox 95%. Continues on IV heparin infusion and amio infusion.  On acyclovir and ampicillin and ceftriaxone for potential meningitis.  Afebrile since 09/13/2021 but leukopenic with a white count of 2.4K.   Culture negative so far Pall care consult - full code + but family meeting being arranged  09/22/21 - Today patient states she is nauseated. RN had to increase her oxygen from 6 L to 8 L . She has some skin burning/ pain from radiation on her left chest and back. Dr. Earlie Server spoke with patient 10/27 about the fact he did not feel she was a good candidate for further chemotherapy, or targeted therapy, because of her poor performance status and worsening respiratory failure. Palliation was consulted, but patient remains a Full Code.  Family are at bedside   Interim History / Subjective:   10/31 - down to 6L [place]. Using cPAP QHS now. Reports she would love to go home. Getting XRT - most recent today  Objective   Blood pressure (!) 123/55, pulse (!) 101, temperature 98.2 F (36.8 C), temperature source Oral, resp. rate 18, height _0  (1.753 m), weight 85.4 kg, SpO2 96 %.    FiO2 (%):  [50 %] 50 % Estimated body mass index is 27.8 kg/m as calculated from the following:   Height as of this encounter: _1  (1.753 m).   Weight as of this encounter: 85.4 kg.   Intake/Output Summary (Last 24 hours) at 09/25/2021 1229 Last data filed at 09/25/2021 0500 Gross per 24 hour  Intake 1012.5 ml  Output 1025 ml  Net -12.5 ml   Filed Weights   09/16/21 1200 09/16/21 1600 09/18/21 0500  Weight: 75 kg 75 kg 85.4 kg    Examination: General Appearance:  Looks emaciated but better (more alert) than 10d ag Head:  Normocephalic, without obvious abnormality, atraumatic Eyes:  PERRL - yes, conjunctiva/corneas - muddy     Ears:  Normal external ear canals, both ears Nose:  G tube - no but has [place] 6L  Throat:  ETT TUBE - no , OG tube - n Neck:  Supple,  No enlargement/tenderness/nodules Lungs: Clear to auscultation bilaterally,  Pulse ox 100% on 6L [place] Heart:  S1 and S2 normal, no murmur, CVP - no.  Pressors - no Abdomen:  Soft, no masses, no organomegaly Genitalia / Rectal:  Not done Extremities:   Extremities- intact Skin:  ntact in exposed areas . Sacral area - no decub per RN. LEFT CHEST HYPER-PIGMENTED due to XRT Neurologic:  Sedation - none -> RASS - +1 . Moves all 4s - yes. CAM-ICU - neg . Orientation - x3+        Resolved Hospital Problem list     Assessment & Plan:   Acute on chronic respiratory failure with hypoxia and hypercapnia- due to acute COPD exacerbation and progressive lung adenocarcinoma.  Her baseline lung function is severely impaired (FEV1 24% predicted in 2021). She has very widespread cancer throughout the left lung.  09/25/2021 - down to 6 LNC which is her pre-admit baseline. Intolerant to BiPAP but now using cPAP QHS  Plan - check ABG 09/25/2021  -Continue bronchodilators.  As she gets closer to discharge would plan to send home on triple inhaled therapy-LABA, LABA, ICS.  - Ok to continue to wean prednisone. - Continue diuresis. - cPAP QHS - Radiation per rad onc.   Acute encephalopathy with agitated delirium- continues to improve. - Empiric acyclovir x14 days per ID  recommendations.  Patient's family previously refused LP.  10/31 - normal mental status  Plan -reorientation, OOB mobility, family visitation -B12 supplements  Acute on chronic pain -fentanyl PRN  SVT, paroxysmal AF, currently NSR  - Appreciate cardiology's management.  Con't amio & heparin.  - tele  Adenocarcinoma of the lung, on chemoradiation- Stage IIIA (T4, N1, M0)  adenocarcinoma - presented with large left upper lobe lung mass with chest wall invasion in addition to left suprahilar lymphadenopathy diagnosed in August 2022.  Despite treatment she has had progression with rib destruction. No further chemo or biologic Rx  per Dr Julien Nordmann dsicussion with ccm 09/21/21  plan -XRT per schedule  - no chemo -no biologic Rx - son wants 2nd opinion - to be arranged as opd per [facility] MD  Chronic anemia, stable Acute thrombocytopenia Acute leukopenia --con't to monitor, trend  CBC - Monitor for bleeding -tranfuse for Hb<7 or hemodynamically significant bleeding  Hyponatremia, likely hypervolemic- resolved Hypokalemia, resolved Mild hypomagnesmia,   10/31 - mag < 2  Plan  - replete mag  AKI - since 09/23/21  09/25/2021 - on acyclovir. [facility] assumes volume issue  Plan  - tRH managing   Failure to thrive with > 30# weight  loss PTA Cancer pain on chronic opioid Severe Protein calorie malnutrition  -Ongoing GOC discussions, appreciate Palliative Care's input. - Con't  Ensure 3 times daily   Summary as of 09/22/21 and 09/25/21 Poor prognosis given baseline severe COPD and hypoxic respiratory failure, with spreading adenocarcinoma and known hilar nodal disease, now with significant deconditioning and ongoing acute on chronic respiratory failure. Per Dr. Julien Nordmann  her performance status and ongoing respiratory failure  are limiting her candidacy  for chemo with how limited her reserve is.   Her family wants to continue all aggressive care measures and hopes she can still be a candidate for future therapies and wants to work towards rehabilitation. Her son is very motivated for her to achieve this. No changes to her therapy at this time. They understand she needs to be on less oxygen to be able to go home.  10/31 - updated son. Move to pogressive. Explained if worse - can come back to SDU/ICU/. He wants to esnure she gets PT on progressive   Best Practice (right click and "Reselect all SmartList Selections" daily)   Diet/type: Regular consistency (see orders) DVT prophylaxis: other GI prophylaxis: N/A Lines: N/A Foley:  N/A Code Status:  full code Last date of multidisciplinary goals of care discussion [10/27-- son, niece, patient. She remains full code at this time, updated son 09/25/21  - son updated over phone by CCM.]   10/31 - updated son. Move to pogressive. Explained if worse - can come back to SDU/ICU/. He wants to esnure she gets PT on  progressive  CCM wil lfollow 1-2x/week      SIGNATURE    Dr. Brand Males, M.D., F.C.C.P,  Pulmonary and Critical Care Medicine Staff Physician, [place] Director - Interstitial Lung Disease  Program  [facility] [place] [facility] [place], [place], [address]  NPI Number:  NPI #[id number]  Pager: [phone], If no answer  -> Check AMION or Try [phone] Telephone (clinical office): [phone] Telephone (research): [phone]  12:30 PM 09/25/2021    LABS    PULMONARY No results for input(s): PHART, PCO2ART, PO2ART, HCO3, TCO2, O2SAT in the last 168 hours.  Invalid input(s): PCO2, PO2  CBC  Recent Labs  Lab 09/23/21 0310 09/23/21 2152 09/24/21 0458 09/24/21 2040 09/25/21 0316  HGB 7.0*   < > 7.1* 8.7* 8.0*  HCT 24.8*   < > 24.2* 29.1* 27.0*  WBC 4.9  --  5.1  --  5.5  PLT 161  --  139*  --  135*   < > = values in this interval not displayed.    COAGULATION No results for input(s): INR in the last 168 hours.  CARDIAC  No results for input(s): TROPONINI in the last 168 hours. No results for input(s): PROBNP in the last 168 hours.   CHEMISTRY Recent Labs  Lab 09/21/21 0507 09/22/21 0223 09/23/21 0310 09/24/21 0458 09/25/21 0316  NA 138 136 135 137 137  K 3.7 3.6 3.7 3.5 3.8  CL 80* 79* 80* 85* 85*  CO2 >45* >45* >45* >45* >45*  GLUCOSE 92 138* 102* 85 80  BUN _0 CREATININE 0.54 0.67 1.03* 1.25* 1.42*  CALCIUM 8.4* 8.3* 8.2* 7.7* 7.7*  MG 1.8 2.0 2.0 1.9 1.8   Estimated Creatinine Clearance: 47.3 mL/min (A) (by C-G formula based on SCr of 1.42 mg/dL (H)).   LIVER No results for input(s): AST, ALT, ALKPHOS, BILITOT, PROT, ALBUMIN, INR in the last 168 hours.   INFECTIOUS No results for input(s): LATICACIDVEN, PROCALCITON in the last 168 hours.   ENDOCRINE CBG (last 3)  No results for input(s): GLUCAP in the last 72 hours.       IMAGING x48h  - image(s)  personally visualized  -   highlighted in bold No results found.

## 2021-09-25 NOTE — Progress Notes (Signed)
Progress Note    Patricia Sampson   PYY:[id number]  DOB: [birth year]  DOA: 09/20/2021     14 Date of Service: 09/25/2021   Clinical Course Patricia Sampson is a 65 y.o. female with PMH Stage IIIa LUL adenocarcinoma, chronic hypoxic respiratory failure 6L O2 at home, HTN, HLD who presented with AMS. She underwent extensive workup but ultimately an LP was declined. She was treated empirically with abx and acyclovir for encephalitis and her mentation improved.   Assessment and Plan * SIRS (systemic inflammatory response syndrome) (HCC)-resolved as of 09/22/2021 - no clear source ever presented; evaluated by ID as well; LP not pursued per family wish - s/p rocephin and acyclovir - continues on monotherapy acyclovir to complete total of 14 days per ID  Acute metabolic encephalopathy-resolved as of 09/20/2021 - presented with AMS and has slowly improved during hospitalization  - now AO (name, place, year)  Acute on chronic respiratory failure with hypoxia and hypercapnia (HCC) - on 6L O2 at home via concentrator and increased with activity - down to ~8L - continue weaning O2 back down and turn up with activity as needed  AKI (acute kidney injury) ([place]) - baseline creatinine ~ 0.7 - patient presents with increase in creat >0.3 mg/dL above baseline, creat increase >1.5x baseline presumed to have occurred within past 7 days PTA -Multifactorial etiology from hypoxia, hyopTN, possible acyclovir, poor intake at times -Given hypoalbuminemia and respiratory issues, fluids have been deferred for the last few days - since creatinine still uptrending, will start low rate IVF and monitor; at risk for ATN due to the above   Normocytic anemia - Multifactorial etiology from underlying chronic illness, malignancy - Hemoglobin down to 7 g/dL on 10/29; s/p 1 unit PRBC - Hgb still low, 7.1 g/dL on 10/24, will order 1 unit PRBC - Hgb stable today, 8 g/dL  Adenocarcinoma of left lung, stage 3  (HCC) Stage IIIA (T4, N1, M0) non-small cell lung cancer, adenocarcinoma presented with large left upper lobe lung mass with chest wall invasion in addition to left suprahilar lymphadenopathy diagnosed in August 2022  - follows with Dr. Lisbeth Renshaw and Dr. Julien Nordmann - palliative care also following; currently full scope of care per family and patient  - undergoing daily radiation still -No further chemo being offered per oncology due to overall poor performance status.  Recommendation is for patient and family to consider palliative care/hospice however second opinion being considered; would be outpatient referral - low physical reserve; concern is for poor functional status to tolerate further chemo  PAF (paroxysmal atrial fibrillation) ([place]) - followed by cardiology during hospitalization as well; likely precipitated by SIRS/infection/hospitalization  - heparin transitioned to Eliquis on 10/26 - continue amiodarone  - rec's are for 30 day outpt event monitor and if no recurrence of afib outpatient then possibility of stopping treatment   COPD (chronic obstructive pulmonary disease) ([place]) - continue O2 and breathing treatments -Steroid taper being initiated per pulmonology and arranging for nightly BiPAP     Subjective:  No events overnight.  Son present bedside this morning.  Patient resting in bed comfortable.  We discussed ongoing goal is to tolerate physical activity with PT in order to mimic what is needed to do at home, once accomplished she would be considered stable for discharging home.   Objective Vitals:   09/25/21 0542 09/25/21 0700 09/25/21 0731 09/25/21 0818  BP: (!) 123/55     Pulse:  (!) 101    Resp: (!) 22 18  Temp:    98.1 F (36.7 C)  TempSrc:    Axillary  SpO2: 100% 96% 96%   Weight:      Height:       85.4 kg  Vital signs were reviewed and unremarkable.   Exam Physical Exam Constitutional:      General: She is not in acute distress.    Appearance: Normal  appearance.  HENT:     Head: Normocephalic and atraumatic.     Mouth/Throat:     Mouth: Mucous membranes are moist.  Eyes:     Extraocular Movements: Extraocular movements intact.  Cardiovascular:     Rate and Rhythm: Normal rate and regular rhythm.  Pulmonary:     Effort: Pulmonary effort is normal.     Breath sounds: Normal breath sounds.  Abdominal:     General: Bowel sounds are normal. There is no distension.     Palpations: Abdomen is soft.     Tenderness: There is no abdominal tenderness.  Musculoskeletal:        General: Normal range of motion.     Cervical back: Normal range of motion and neck supple.  Skin:    General: Skin is warm and dry.  Neurological:     General: No focal deficit present.     Mental Status: She is alert.  Psychiatric:        Mood and Affect: Mood normal.        Behavior: Behavior normal.     Labs / Other Information My review of labs, imaging, notes and other tests shows no new significant findings.    Disposition Plan: Status is: Inpatient  Remains inpatient appropriate because: Treatment of above     Time spent: Greater than 50% of the 35 minute visit was spent in counseling/coordination of care for the patient as laid out in the A&P.  Dwyane Dee, MD [facility] 09/25/2021, 11:24 AM

## 2021-09-25 NOTE — TOC Progression Note (Signed)
Transition of Care [facility]) - Progression Note    Patient Details  Name: Patricia Sampson MRN: [id number] Date of Birth: 31-Dec-1955  [facility] CM/SW Contact  Ross Ludwig, [place] Phone Number: 09/25/2021, 11:43 AM  Clinical Narrative:     CSW was informed that patient will need a Trilogy.  CSW contacted Zach at [facility] and he will work on completing required paperwork.    Barriers to Discharge: Continued Medical Work up  Expected Discharge Plan and Services     Discharge Planning Services: CM Consult   Living arrangements for the past 2 months: Single Family Home                                       Social Determinants of Health (SDOH) Interventions    Readmission Risk Interventions No flowsheet data found.

## 2021-09-25 NOTE — Clinical Social Work Note (Addendum)
Patricia Sampson presents with acute on chronic respiratory failure with hypoxia and hypercapnia due to COPD. The use of the NIV will treat patients high PC02 levels (76.9 with elevated Bicarbonate of 45.4 on 09/25/21) and can reduce risk of exacerbations and future hospitalizations when used at night and during the day.  All alternate devices [phone] and [id number]) have been proven ineffective to provide essential volume control necessary to maintain acceptable CO2 levels.  An NIV with IVAPS is necessary to prevent patient from life-threatening harm.  Interruption or failure to provide NIV would quickly lead to exacerbation of the patients condition, hospital admission, and likely harm to the patient. Furthermore, [employer] Breathe A Little Easier Program has shown an 85.1% readmission reduction locally for COPD patients.  Continued use is preferred.  Patient is able to protect their airways and clear secretions on their own.

## 2021-09-26 ENCOUNTER — Ambulatory Visit: Payer: 59

## 2021-09-26 ENCOUNTER — Ambulatory Visit
Admission: RE | Admit: 2021-09-26 | Discharge: 2021-09-26 | Disposition: A | Discharge status: Home / Self Care | Payer: 59 | Source: Ambulatory Visit | Attending: Radiation Oncology | Admitting: Radiation Oncology

## 2021-09-26 DIAGNOSIS — Z5111 Encounter for antineoplastic chemotherapy: Secondary | ICD-10-CM | POA: Insufficient documentation

## 2021-09-26 DIAGNOSIS — I10 Essential (primary) hypertension: Secondary | ICD-10-CM | POA: Insufficient documentation

## 2021-09-26 DIAGNOSIS — C3412 Malignant neoplasm of upper lobe, left bronchus or lung: Secondary | ICD-10-CM | POA: Insufficient documentation

## 2021-09-26 DIAGNOSIS — Z79899 Other long term (current) drug therapy: Secondary | ICD-10-CM | POA: Insufficient documentation

## 2021-09-26 DIAGNOSIS — D649 Anemia, unspecified: Secondary | ICD-10-CM | POA: Insufficient documentation

## 2021-09-26 DIAGNOSIS — G9341 Metabolic encephalopathy: Secondary | ICD-10-CM | POA: Diagnosis not present

## 2021-09-26 DIAGNOSIS — Z510 Encounter for antineoplastic radiation therapy: Secondary | ICD-10-CM | POA: Insufficient documentation

## 2021-09-26 DIAGNOSIS — J9621 Acute and chronic respiratory failure with hypoxia: Secondary | ICD-10-CM | POA: Diagnosis not present

## 2021-09-26 DIAGNOSIS — N179 Acute kidney failure, unspecified: Secondary | ICD-10-CM | POA: Diagnosis not present

## 2021-09-26 DIAGNOSIS — R6510 Systemic inflammatory response syndrome (SIRS) of non-infectious origin without acute organ dysfunction: Secondary | ICD-10-CM | POA: Diagnosis not present

## 2021-09-26 LAB — CBC WITH DIFFERENTIAL/PLATELET
Abs Immature Granulocytes: 0.13 10*3/uL — ABNORMAL HIGH (ref 0.00–0.07)
Basophils Absolute: 0 10*3/uL (ref 0.0–0.1)
Basophils Relative: 0 %
Eosinophils Absolute: 0.1 10*3/uL (ref 0.0–0.5)
Eosinophils Relative: 1 %
HCT: 28.3 % — ABNORMAL LOW (ref 36.0–46.0)
Hemoglobin: 8.2 g/dL — ABNORMAL LOW (ref 12.0–15.0)
Immature Granulocytes: 3 %
Lymphocytes Relative: 4 %
Lymphs Abs: 0.2 10*3/uL — ABNORMAL LOW (ref 0.7–4.0)
MCH: 26.1 pg (ref 26.0–34.0)
MCHC: 29 g/dL — ABNORMAL LOW (ref 30.0–36.0)
MCV: 90.1 fL (ref 80.0–100.0)
Monocytes Absolute: 0.6 10*3/uL (ref 0.1–1.0)
Monocytes Relative: 11 %
Neutro Abs: 4.2 10*3/uL (ref 1.7–7.7)
Neutrophils Relative %: 81 %
Platelets: 129 10*3/uL — ABNORMAL LOW (ref 150–400)
RBC: 3.14 MIL/uL — ABNORMAL LOW (ref 3.87–5.11)
RDW: 18.9 % — ABNORMAL HIGH (ref 11.5–15.5)
WBC: 5.2 10*3/uL (ref 4.0–10.5)
nRBC: 0 % (ref 0.0–0.2)

## 2021-09-26 LAB — BASIC METABOLIC PANEL
Anion gap: 8 (ref 5–15)
BUN: 17 mg/dL (ref 8–23)
CO2: 43 mmol/L — ABNORMAL HIGH (ref 22–32)
Calcium: 8 mg/dL — ABNORMAL LOW (ref 8.9–10.3)
Chloride: 86 mmol/L — ABNORMAL LOW (ref 98–111)
Creatinine, Ser: 1.32 mg/dL — ABNORMAL HIGH (ref 0.44–1.00)
GFR, Estimated: 45 mL/min — ABNORMAL LOW (ref >60)
Glucose, Bld: 116 mg/dL — ABNORMAL HIGH (ref 70–99)
Potassium: 4.2 mmol/L (ref 3.5–5.1)
Sodium: 137 mmol/L (ref 135–145)

## 2021-09-26 LAB — MAGNESIUM: Magnesium: 2.2 mg/dL (ref 1.7–2.4)

## 2021-09-26 MED ORDER — OXYCODONE HCL 5 MG PO TABS
5.0000 mg | ORAL_TABLET | Freq: Four times a day (QID) | ORAL | Status: DC | PRN
Start: 2021-09-26 — End: 2021-10-11

## 2021-09-26 MED ORDER — GABAPENTIN 100 MG PO CAPS
100.0000 mg | ORAL_CAPSULE | Freq: Three times a day (TID) | ORAL | Status: DC
  Administered 2021-09-26 – 2021-09-30 (×13): 100 mg via ORAL
  Filled 2021-09-26 (×13): qty 1

## 2021-09-26 NOTE — Progress Notes (Signed)
Physical Therapy Treatment Patient Details Name: Patricia Sampson MRN: [id number] DOB: 1956/11/09 Today's Date: 09/26/2021   History of Present Illness EMALEY Sampson is a 65 y.o. female with PMH Stage IIIa LUL adenocarcinoma, chronic hypoxic respiratory failure 6L O2 at home, HTN, HLD who presented with AMS 08/29/2021,    PT Comments    Co Tx with OT due to complexity of patient and assist level for safe mobility. Assisted OOB to [facility] then amb a limited distance in room. General bed mobility comments: Able to advance BLE from bed surface to EOB with assist for line management only. Able to elevate trunk with use of bed rail. Reports dizziness initially BP 126/74 . Cues for techniques to reduce dizziness. BP WNL when assessed. General transfer comment: Min guard +2 for sit to stand from elevated EOB. Mod A +2 for sit to stand from wide [facility] with cues for hand placement. Patient reports dizziness upon standing. Increased anxiety as well. General Gait Details: + 2 assist for safety pt was able to amb 10 feet in room from [facility] to recliner with increased time and MAX encouragement as pt present with anxiety about her limited performance and woory about her O2 stats.  Pt remained on 7 lts to achieve sats >90%. Pt hopes to D/C back home with family support.    Recommendations for follow up therapy are one component of a multi-disciplinary discharge planning process, led by the attending physician.  Recommendations may be updated based on patient status, additional functional criteria and insurance authorization.  Follow Up Recommendations  Home health PT     Assistance Recommended at Discharge    Equipment Recommendations  Wheelchair (measurements PT);Wheelchair cushion (measurements PT)    Recommendations for Other Services       Precautions / Restrictions Precautions Precautions: Fall Precaution Comments: monitor vitals Restrictions Weight Bearing Restrictions: No     Mobility  Bed  Mobility Overal bed mobility: Needs Assistance Bed Mobility: Supine to Sit     Supine to sit: Min guard;HOB elevated   Sit to sidelying: Mod assist;+2 for physical assistance;+2 for safety/equipment General bed mobility comments: Able to advance BLE from bed surface to EOB with assist for line management only. Able to elevate trunk with use of bed rail. Reports dizziness initially BP 126/74 . Cues for techniques to reduce dizziness. BP WNL when assessed.    Transfers Overall transfer level: Needs assistance Equipment used: Rolling walker (2 wheels) Transfers: Sit to/from [facility] Sit to Stand: Min guard;Mod assist;+2 safety/equipment Stand pivot transfers: Mod assist;+2 physical assistance         General transfer comment: Min guard +2 for sit to stand from elevated EOB. Mod A +2 for sit to stand from wide [facility] with cues for hand placement. Patient reports dizziness upon standing. Increased anxiety as well.    Ambulation/Gait Ambulation/Gait assistance: Min assist;Mod assist;+2 safety/equipment Gait Distance (Feet): 10 Feet Assistive device: Rolling walker (2 wheels) Gait Pattern/deviations: Step-to pattern;Decreased step length - right;Decreased step length - left Gait velocity: decreased   General Gait Details: + 2 assist for safety pt was able to amb 10 feet in room from [facility] to recliner with increased time and MAX encouragement as pt present with anxiety about her limited performance and woory about her O2 stats.  Pt remained on 7 lts to achieve sats >90%.   Stairs             Wheelchair Mobility    Modified Rankin (Stroke Patients Only)  Balance Overall balance assessment: Needs assistance Sitting-balance support: Feet supported;No upper extremity supported Sitting balance-Leahy Scale: Fair Sitting balance - Comments: Able to maintain static sitting balance without UE support.   Standing balance support: Bilateral upper extremity  supported;Reliant on assistive device for balance Standing balance-Leahy Scale: Poor Standing balance comment: Able to maintain static standing balance with unilateral UE support during hygiene/clothing mangagment.                            Cognition Arousal/Alertness: Awake/alert Behavior During Therapy: Anxious Overall Cognitive Status: Within Functional Limits for tasks assessed                                 General Comments: Reports anxiety about mobility. Affraid she will "dissapoint" herself or PT/OT. Education provided on role/purpose of therapy. Patient reported decreased anxeity once role further explained.        Exercises      General Comments General comments (skin integrity, edema, etc.): SpO2 89% on 6L upon entry; HR 108bpm. With light activity SpO2 decreased to 84% on 6L. Increased to 89-91% with pursed lip breathing.      Pertinent Vitals/Pain Pain Assessment: No/denies pain Pain Score: 2  Pain Location: Bilateral feet (neuropathy) Pain Descriptors / Indicators: Shooting;Sharp Pain Intervention(s): Limited activity within patient's tolerance;Monitored during session;Premedicated before session;Repositioned    Home Living                          Prior Function            PT Goals (current goals can now be found in the care plan section)      Frequency    Min 3X/week      PT Plan Current plan remains appropriate    Co-evaluation PT/OT/SLP Co-Evaluation/Treatment: Yes Reason for Co-Treatment: Complexity of the patient's impairments (multi-system involvement) PT goals addressed during session: Mobility/safety with mobility OT goals addressed during session: ADL's and self-care      AM-PAC PT "6 Clicks" Mobility   Outcome Measure  Help needed turning from your back to your side while in a flat bed without using bedrails?: A Lot Help needed moving from lying on your back to sitting on the side of a flat bed  without using bedrails?: A Lot Help needed moving to and from a bed to a chair (including a wheelchair)?: A Lot Help needed standing up from a chair using your arms (e.g., wheelchair or bedside chair)?: A Lot Help needed to walk in hospital room?: A Lot Help needed climbing 3-5 steps with a railing? : A Lot 6 Click Score: 12    End of Session Equipment Utilized During Treatment: Gait belt;Oxygen Activity Tolerance: Patient limited by fatigue;Other (comment) (anxiety) Patient left: in chair;with chair alarm set;with family/visitor present;with call bell/phone within reach Nurse Communication: Mobility status PT Visit Diagnosis: Unsteadiness on feet (R26.81);Muscle weakness (generalized) (M62.81)     Time: 4742-5956 PT Time Calculation (min) (ACUTE ONLY): 24 min  Charges:  $Gait Training: 8-22 mins                    Rica Koyanagi  PTA Acute  Rehabilitation Services Pager      [phone] Office      [phone]

## 2021-09-26 NOTE — Progress Notes (Signed)
Progress Note    MELONIE GERMANI   QJF:[id number]  DOB: 05-Oct-1956  DOA: 09/13/2021     15 Date of Service: 09/26/2021   Clinical Course Jenella BREYONA SWANDER is a 65 y.o. female with PMH Stage IIIa LUL adenocarcinoma, chronic hypoxic respiratory failure 6L O2 at home, HTN, HLD who presented with AMS. She underwent extensive workup but ultimately an LP was declined. She was treated empirically with abx and acyclovir for encephalitis and her mentation improved.   Assessment and Plan * SIRS (systemic inflammatory response syndrome) (HCC)-resolved as of 09/22/2021 - no clear source ever presented; evaluated by ID as well; LP not pursued per family wish - s/p rocephin and acyclovir - continues on monotherapy acyclovir to complete total of 14 days per ID  Acute metabolic encephalopathy-resolved as of 09/20/2021 - presented with AMS and has slowly improved during hospitalization  - now AO (name, place, year)  Acute on chronic respiratory failure with hypoxia and hypercapnia (HCC) - on 6L O2 at home via concentrator and increased with activity up to ~8L at home - down to ~6L; current limitation is activity tolerance (at home walks short distances from different rooms and still has yet to accomplish that in hospital) - continue weaning O2 back down and turn up with activity as needed -Other thoughts with pulmonology could be discharging patient to [facility] if difficulty with weaning of oxygen especially with activity or her lack of improvement with activity tolerance  AKI (acute kidney injury) ([place]) - baseline creatinine ~ 0.7 - patient presents with increase in creat >0.3 mg/dL above baseline, creat increase >1.5x baseline presumed to have occurred within past 7 days PTA -Multifactorial etiology from hypoxia, hyopTN, possible acyclovir, poor intake at times -Given hypoalbuminemia and respiratory issues, fluids have been deferred for the last few days - since creatinine still uptrending, will  start low rate IVF and monitor; at risk for ATN due to the above; some improvement with IVF, continue 1 more day and re-evaluate    Normocytic anemia - Multifactorial etiology from underlying chronic illness, malignancy - Hemoglobin down to 7 g/dL on 10/29; s/p 1 unit PRBC - Hgb still low, 7.1 g/dL on 10/24, will order 1 unit PRBC - Hgb stable today, 8 g/dL  Adenocarcinoma of left lung, stage 3 (HCC) Stage IIIA (T4, N1, M0) non-small cell lung cancer, adenocarcinoma presented with large left upper lobe lung mass with chest wall invasion in addition to left suprahilar lymphadenopathy diagnosed in August 2022  - follows with Dr. Lisbeth Renshaw and Dr. Julien Nordmann - palliative care also following; currently full scope of care per family and patient  - undergoing daily radiation still -No further chemo being offered per oncology due to overall poor performance status.  Recommendation is for patient and family to consider palliative care/hospice however second opinion being considered; would be outpatient referral - low physical reserve; concern is for poor functional status to tolerate further chemo  PAF (paroxysmal atrial fibrillation) ([place]) - followed by cardiology during hospitalization as well; likely precipitated by SIRS/infection/hospitalization  - heparin transitioned to Eliquis on 10/26 - continue amiodarone  - rec's are for 30 day outpt event monitor and if no recurrence of afib outpatient then possibility of stopping treatment   COPD (chronic obstructive pulmonary disease) ([place]) - continue O2 and breathing treatments -Steroid taper being initiated per pulmonology and arranging for nightly BiPAP     Subjective:  No events overnight.  Continues to have adequate oxygen levels at rest in bed but her biggest  factor is when she gets out of bed with any exertion.  She has still not been able to take several steps which would theoretically be required for her to go home back to her normal  baseline.  Objective Vitals:   09/26/21 0657 09/26/21 0853 09/26/21 0937 09/26/21 1034  BP: 124/78  121/72 130/70  Pulse: (!) 101  (!) 109 (!) 101  Resp: _0 Temp: 98.1 F (36.7 C)  98.2 F (36.8 C) 98.2 F (36.8 C)  TempSrc: Oral  Oral Oral  SpO2: 97% 94% 92%   Weight:      Height:       85.4 kg  Vital signs were reviewed and unremarkable.   Exam Physical Exam Constitutional:      General: She is not in acute distress.    Appearance: Normal appearance.  HENT:     Head: Normocephalic and atraumatic.     Mouth/Throat:     Mouth: Mucous membranes are moist.  Eyes:     Extraocular Movements: Extraocular movements intact.  Cardiovascular:     Rate and Rhythm: Normal rate and regular rhythm.  Pulmonary:     Effort: Pulmonary effort is normal.     Breath sounds: Normal breath sounds.  Abdominal:     General: Bowel sounds are normal. There is no distension.     Palpations: Abdomen is soft.     Tenderness: There is no abdominal tenderness.  Musculoskeletal:        General: Normal range of motion.     Cervical back: Normal range of motion and neck supple.  Skin:    General: Skin is warm and dry.  Neurological:     General: No focal deficit present.     Mental Status: She is alert.  Psychiatric:        Mood and Affect: Mood normal.        Behavior: Behavior normal.     Labs / Other Information My review of labs, imaging, notes and other tests shows no new significant findings.    Disposition Plan: Status is: Inpatient  Remains inpatient appropriate because: Treatment of above     Time spent: Greater than 50% of the 35 minute visit was spent in counseling/coordination of care for the patient as laid out in the A&P.  Dwyane Dee, MD [facility] 09/26/2021, 1:03 PM

## 2021-09-26 NOTE — Progress Notes (Signed)
Occupational Therapy Co-Treatment w/ PT Patient Details Name: [name] MRN: [id number] DOB: 26-Jul-1956 Today's Date: 09/26/2021   History of present illness Patricia Sampson is a 65 y.o. female with PMH Stage IIIa LUL adenocarcinoma, chronic hypoxic respiratory failure 6L O2 at home, HTN, HLD who presented with AMS 09/04/2021,   OT comments  PT/OT co-treatment session with focus on self-care re-education, bed mobility, functional transfers and short-distance functional mobility with use of RW. Patient initially reporting increased anxiety at the thought of mobility. With further questioning, patient reports fear of disappointing PT/OT or herself. Further education provided on role of therapy services. Patient expressed verbal understanding. Patient making progress toward goals demonstrating 3/3 parts of toileting task Min A seated on BSC. Patient also requires Min guard +2 for sit to stands from elevated surfaces, Mod A +2 for sit to stand from low surfaces (including BSC) and Min guard +2 for stand-pivot transfers. Patient also tolerated short distance functional mobility ~8-82f with RW and increased time. OT will continue to follow acutely.    Recommendations for follow up therapy are one component of a multi-disciplinary discharge planning process, led by the attending physician.  Recommendations may be updated based on patient status, additional functional criteria and insurance authorization.    Follow Up Recommendations  Skilled nursing-short term rehab (<3 hours/day)    Assistance Recommended at Discharge Frequent or constant Supervision/Assistance  Equipment Recommendations  Other (comment) (Defer to next level of care.)    Recommendations for Other Services      Precautions / Restrictions Precautions Precautions: Fall Precaution Comments: monitor vitals Restrictions Weight Bearing Restrictions: No       Mobility Bed Mobility Overal bed mobility: Needs Assistance Bed  Mobility: Supine to Sit     Supine to sit: Min guard;HOB elevated     General bed mobility comments: Able to advance BLE from bed surface to EOB with assist for line management only. Able to elevate trunk with use of bed rail. Reports dizziness initially. Cues for techniques to reduce dizziness. BP WNL when assessed.    Transfers Overall transfer level: Needs assistance Equipment used: Rolling walker (2 wheels) Transfers: [facility] to Stand: Min guard;Mod assist;+2 safety/equipment           General transfer comment: Min guard +2 for sit to stand from elevated EOB. Mod A +2 for sit to [facility]. Patient reports dizziness upon standing.     Balance Overall balance assessment: Needs assistance Sitting-balance support: Feet supported;No upper extremity supported Sitting balance-Leahy Scale: Fair Sitting balance - Comments: Able to maintain static sitting balance without UE support.   Standing balance support: Bilateral upper extremity supported;Reliant on assistive device for balance Standing balance-Leahy Scale: Poor Standing balance comment: Able to maintain static standing balance with unilateral UE support during hygiene/clothing mangagment.                           ADL either performed or assessed with clinical judgement   ADL Overall ADL's : Needs assistance/impaired     Grooming: Set up;Sitting               Lower Body Dressing: Moderate assistance;Sit to/from stand;+2 for physical assistance Lower Body Dressing Details (indicate cue type and reason): Able to attain figure-4 position to adjust footwear seated EOB without external assist. Toilet Transfer: Moderate assistance;Min guard;+2 for safety/equipment;Rolling walker (2 wheels) Toilet Transfer Details (  indicate cue type and reason): Min guard +2 stand-pivot to [facility] with use of RW and cues for hand placement prior to sitting.  Mod A +2 for sit to stand from [facility]. Toileting- Clothing Manipulation and Hygiene: Minimal assistance;Sit to/from stand Toileting - Clothing Manipulation Details (indicate cue type and reason): Min A for hygiene/clothing management in sitting/standing.             Vision       Perception     Praxis      Cognition Arousal/Alertness: Awake/alert Behavior During Therapy: Anxious Overall Cognitive Status: Within Functional Limits for tasks assessed                                 General Comments: Reports anxiety about mobility. Affraid she will "dissapoint" herself or PT/OT. Education provided on role/purpose of therapy. Patient reported decreased anxeity once role further explained.          Exercises     Shoulder Instructions       General Comments SpO2 89% on 6L upon entry; HR 108bpm. With light activity SpO2 decreased to 84% on 6L. Increased to 89-91% with pursed lip breathing.    Pertinent Vitals/ Pain       Pain Assessment: 0-10 Pain Score: 2  Pain Location: Bilateral feet (neuropathy) Pain Descriptors / Indicators: Shooting;Sharp Pain Intervention(s): Limited activity within patient's tolerance;Monitored during session;Premedicated before session;Repositioned  Home Living                                          Prior Functioning/Environment              Frequency  Min 2X/week        Progress Toward Goals  OT Goals(current goals can now be found in the care plan section)  Progress towards OT goals: Progressing toward goals  Acute Rehab OT Goals Patient Stated Goal: To get stronger OT Goal Formulation: With patient Time For Goal Achievement: 10/05/21 Potential to Achieve Goals: Good ADL Goals Pt Will Perform Grooming: standing;with min assist Pt Will Perform Lower Body Dressing: with min assist;sit to/from stand Pt Will Transfer to Toilet: with min assist;regular height toilet Pt Will Perform Toileting -  Clothing Manipulation and hygiene: with min assist;sit to/from stand  Plan Discharge plan remains appropriate;Frequency remains appropriate    Co-evaluation    PT/OT/SLP Co-Evaluation/Treatment: Yes Reason for Co-Treatment: Complexity of the patient's impairments (multi-system involvement);To address functional/ADL transfers   OT goals addressed during session: ADL's and self-care      AM-PAC OT "6 Clicks" Daily Activity     Outcome Measure   Help from another person eating meals?: None Help from another person taking care of personal grooming?: A Little Help from another person toileting, which includes using toliet, bedpan, or urinal?: A Lot Help from another person bathing (including washing, rinsing, drying)?: A Lot Help from another person to put on and taking off regular upper body clothing?: A Little Help from another person to put on and taking off regular lower body clothing?: A Lot 6 Click Score: 16    End of Session Equipment Utilized During Treatment: Gait belt;Oxygen;Rolling walker (2 wheels)  OT Visit Diagnosis: Unsteadiness on feet (R26.81);History of falling (Z91.81);Muscle weakness (generalized) (M62.81);Pain   Activity Tolerance Patient tolerated treatment well   Patient Left in chair;with call  bell/phone within reach;with chair alarm set   Nurse Communication Mobility status        Time: 1324-1410 OT Time Calculation (min): 46 min  Charges: OT General Charges $OT Visit: 1 Visit OT Treatments $Self Care/Home Management : 23-37 mins  Miyah Hampshire H. OTR/L Supplemental OT, Department of rehab services [phone]  Davie Claud R H. 09/26/2021, 2:24 PM

## 2021-09-26 DEATH — deceased

## 2021-09-27 ENCOUNTER — Ambulatory Visit
Admission: RE | Admit: 2021-09-27 | Discharge: 2021-09-27 | Disposition: A | Discharge status: Home / Self Care | Payer: 59 | Source: Ambulatory Visit | Attending: Radiation Oncology | Admitting: Radiation Oncology

## 2021-09-27 ENCOUNTER — Ambulatory Visit: Payer: 59

## 2021-09-27 DIAGNOSIS — R6510 Systemic inflammatory response syndrome (SIRS) of non-infectious origin without acute organ dysfunction: Secondary | ICD-10-CM | POA: Diagnosis not present

## 2021-09-27 DIAGNOSIS — J9622 Acute and chronic respiratory failure with hypercapnia: Secondary | ICD-10-CM | POA: Diagnosis not present

## 2021-09-27 DIAGNOSIS — J9621 Acute and chronic respiratory failure with hypoxia: Secondary | ICD-10-CM | POA: Diagnosis not present

## 2021-09-27 LAB — CBC WITH DIFFERENTIAL/PLATELET
Abs Immature Granulocytes: 0.07 10*3/uL (ref 0.00–0.07)
Basophils Absolute: 0 10*3/uL (ref 0.0–0.1)
Basophils Relative: 0 %
Eosinophils Absolute: 0.1 10*3/uL (ref 0.0–0.5)
Eosinophils Relative: 1 %
HCT: 27.5 % — ABNORMAL LOW (ref 36.0–46.0)
Hemoglobin: 8 g/dL — ABNORMAL LOW (ref 12.0–15.0)
Immature Granulocytes: 1 %
Lymphocytes Relative: 3 %
Lymphs Abs: 0.2 10*3/uL — ABNORMAL LOW (ref 0.7–4.0)
MCH: 26.6 pg (ref 26.0–34.0)
MCHC: 29.1 g/dL — ABNORMAL LOW (ref 30.0–36.0)
MCV: 91.4 fL (ref 80.0–100.0)
Monocytes Absolute: 0.6 10*3/uL (ref 0.1–1.0)
Monocytes Relative: 11 %
Neutro Abs: 4.5 10*3/uL (ref 1.7–7.7)
Neutrophils Relative %: 84 %
Platelets: 117 10*3/uL — ABNORMAL LOW (ref 150–400)
RBC: 3.01 MIL/uL — ABNORMAL LOW (ref 3.87–5.11)
RDW: 19.2 % — ABNORMAL HIGH (ref 11.5–15.5)
WBC: 5.3 10*3/uL (ref 4.0–10.5)
nRBC: 0 % (ref 0.0–0.2)

## 2021-09-27 LAB — BASIC METABOLIC PANEL
Anion gap: 3 — ABNORMAL LOW (ref 5–15)
BUN: 17 mg/dL (ref 8–23)
CO2: 44 mmol/L — ABNORMAL HIGH (ref 22–32)
Calcium: 8 mg/dL — ABNORMAL LOW (ref 8.9–10.3)
Chloride: 91 mmol/L — ABNORMAL LOW (ref 98–111)
Creatinine, Ser: 1.5 mg/dL — ABNORMAL HIGH (ref 0.44–1.00)
GFR, Estimated: 39 mL/min — ABNORMAL LOW (ref >60)
Glucose, Bld: 98 mg/dL (ref 70–99)
Potassium: 4.2 mmol/L (ref 3.5–5.1)
Sodium: 138 mmol/L (ref 135–145)

## 2021-09-27 LAB — MAGNESIUM: Magnesium: 2 mg/dL (ref 1.7–2.4)

## 2021-09-27 MED ORDER — SODIUM CHLORIDE 0.9 % IV SOLN: INTRAVENOUS | Status: DC

## 2021-09-27 NOTE — Progress Notes (Signed)
PROGRESS NOTE    Patricia Sampson  OYD:[id number] DOB: 26-Dec-1955 DOA: 09/21/2021 PCP: Greig Right, MD  Brief Narrative: Patricia Sampson is a 65 y.o. female with PMH Stage IIIa LUL adenocarcinoma, chronic hypoxic respiratory failure 6L O2 at home, HTN, HLD who presented with AMS. She underwent extensive workup but ultimately an LP was declined. She was treated empirically with abx and acyclovir for encephalitis and her mentation improved.     Assessment & Plan:   Active Problems:   COPD (chronic obstructive pulmonary disease) (HCC)   Adenocarcinoma of left lung, stage 3 (HCC)   PAF (paroxysmal atrial fibrillation) (HCC)   Acute on chronic respiratory failure with hypoxia and hypercapnia (HCC)   Palliative care by specialist   Goals of care, counseling/discussion   Normocytic anemia   AKI (acute kidney injury) ([place])  #1 acute on chronic hypoxic hypercapnic respiratory failure patient to 80s on 6 L of oxygen at home goes up to 8 L with activity.  Patient currently requiring 10 L of O2 as her saturations dropped to 80s on 6 L. Patient and the family wants to be a full code She has stage III adenocarcinoma of the left upper lobe with invasion of the chest wall and left suprahilar lymphadenopathy diagnosed in August 2022. Patient is not a candidate for any further chemotherapy and oncology has recommended palliative care/hospice  09/15/2021 CT -  Progressive left upper lobe primary bronchogenic carcinoma with increased destruction of the anterior left second rib. New perilymphatic nodularity in the left upper lobe adjacent to the mass, consistent with lymphangitic carcinomatosis. 3. 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 Reconsulted PALLIATIVE.  #2 paroxysmal atrial fibrillation on Eliquis since 09/20/2021 continue amiodarone.  Plan is to have a 30-day outpatient event monitor.  #3 AKI creatinine is trending up continue IV fluids while on acyclovir  #4 acute metabolic  encephalopathy has been resolved, multifactorial etiology   Estimated body mass index is 27.8 kg/m as calculated from the following:   Height as of this encounter: _0  (1.753 m).   Weight as of this encounter: 85.4 kg.  DVT prophylaxis: Eliquis  code Status: Full code  family Communication: None  disposition Plan:  Status is: Inpatient  Remains inpatient appropriate because: Hypoxic respiratory failure with hypercapnia    Consultants:  PCCM  Procedures: None  Subjective:  She is resting in bed she is awake and alert She is currently on 6 L of oxygen when I saw her in the morning Her oxygen requirement went up to 10 L later She is not able to move or take any steps due to severe hypoxia with exertion. Objective: Vitals:   09/27/21 0917 09/27/21 1125 09/27/21 1307 09/27/21 1432  BP:  113/66 113/62   Pulse:  (!) 101 100   Resp:  19 18   Temp:  98.5 F (36.9 C) 98.8 F (37.1 C)   TempSrc:  Oral Oral   SpO2: 97% 99% 91% 91%  Weight:      Height:        Intake/Output Summary (Last 24 hours) at 09/27/2021 1519 Last data filed at 09/27/2021 0700 Gross per 24 hour  Intake 1307.94 ml  Output 1300 ml  Net 7.94 ml   Filed Weights   09/16/21 1200 09/16/21 1600 09/18/21 0500  Weight: 75 kg 75 kg 85.4 kg    Examination:  General exam: Appears calm and comfortable  Respiratory system: few rhonchi to auscultation. Respiratory effort normal. Cardiovascular system: S1 & S2 heard,  RRR. No JVD, murmurs, rubs, gallops or clicks. No pedal edema. Gastrointestinal system: Abdomen is nondistended, soft and nontender. No organomegaly or masses felt. Normal bowel sounds heard. Central nervous system: Alert and oriented. No focal neurological deficits. Extremities: Symmetric 5 x 5 power. Skin: No rashes, lesions or ulcers Psychiatry: Judgement and insight appear normal. Mood & affect appropriate.     Data Reviewed: I have personally reviewed following labs and imaging  studies  CBC: Recent Labs  Lab 09/23/21 0310 09/23/21 2152 09/24/21 0458 09/24/21 2040 09/25/21 0316 09/26/21 0358 09/27/21 0521  WBC 4.9  --  5.1  --  5.5 5.2 5.3  NEUTROABS 3.7  --  4.1  --  4.5 4.2 4.5  HGB 7.0*   < > 7.1* 8.7* 8.0* 8.2* 8.0*  HCT 24.8*   < > 24.2* 29.1* 27.0* 28.3* 27.5*  MCV 89.5  --  88.3  --  88.2 90.1 91.4  PLT 161  --  139*  --  135* 129* 117*   < > = values in this interval not displayed.   Basic Metabolic Panel: Recent Labs  Lab 09/23/21 0310 09/24/21 0458 09/25/21 0316 09/26/21 0358 09/27/21 0521  NA 135 137 137 137 138  K 3.7 3.5 3.8 4.2 4.2  CL 80* 85* 85* 86* 91*  CO2 >45* >45* >45* 43* 44*  GLUCOSE 102* 85 80 116* 98  BUN _0 CREATININE 1.03* 1.25* 1.42* 1.32* 1.50*  CALCIUM 8.2* 7.7* 7.7* 8.0* 8.0*  MG 2.0 1.9 1.8 2.2 2.0   GFR: Estimated Creatinine Clearance: 44.8 mL/min (A) (by C-G formula based on SCr of 1.5 mg/dL (H)). Liver Function Tests: No results for input(s): AST, ALT, ALKPHOS, BILITOT, PROT, ALBUMIN in the last 168 hours. No results for input(s): LIPASE, AMYLASE in the last 168 hours. No results for input(s): AMMONIA in the last 168 hours. Coagulation Profile: No results for input(s): INR, PROTIME in the last 168 hours. Cardiac Enzymes: No results for input(s): CKTOTAL, CKMB, CKMBINDEX, TROPONINI in the last 168 hours. BNP (last 3 results) No results for input(s): PROBNP in the last 8760 hours. HbA1C: No results for input(s): HGBA1C in the last 72 hours. CBG: No results for input(s): GLUCAP in the last 168 hours. Lipid Profile: No results for input(s): CHOL, HDL, LDLCALC, TRIG, CHOLHDL, LDLDIRECT in the last 72 hours. Thyroid Function Tests: No results for input(s): TSH, T4TOTAL, FREET4, T3FREE, THYROIDAB in the last 72 hours. Anemia Panel: No results for input(s): VITAMINB12, FOLATE, FERRITIN, TIBC, IRON, RETICCTPCT in the last 72 hours. Sepsis Labs: No results for input(s): PROCALCITON,  LATICACIDVEN in the last 168 hours.  No results found for this or any previous visit (from the past 240 hour(s)).    Radiology Studies: No results found.   Scheduled Meds:  amiodarone  200 mg Oral Daily   apixaban  5 mg Oral BID   chlorhexidine  15 mL Mouth Rinse BID   Chlorhexidine Gluconate Cloth  6 each Topical Daily   feeding supplement  237 mL Oral TID BM   gabapentin  100 mg Oral TID   ipratropium-albuterol  3 mL Nebulization TID   mouth rinse  15 mL Mouth Rinse q12n4p   predniSONE  20 mg Oral Q breakfast   PruTect   Topical BID   sodium chloride flush  10-40 mL Intracatheter Q12H   vitamin B-12  1,000 mcg Oral Daily   Continuous Infusions:  sodium chloride 10 mL/hr at 09/22/21 0559   sodium chloride  Stopped (09/18/21 0523)   sodium chloride 75 mL/hr at 09/27/21 0934     LOS: 16 days    Georgette Shell, MD 09/27/2021, 3:19 PM

## 2021-09-27 NOTE — Plan of Care (Signed)

## 2021-09-27 NOTE — Progress Notes (Signed)
RT NOTE:  RT administered flutter valve to pt per MD order. Pt able to perform well and had a good strong cough afterwards.

## 2021-09-27 NOTE — Progress Notes (Signed)
Increased patient's oxygen from 6L/min to 10L/min due to oxygen saturation sitting around 80%. Oxygen saturation increased to around 90% on 10L

## 2021-09-27 NOTE — Progress Notes (Signed)
NAME:  Patricia Sampson, MRN:  [id number], DOB:  September 27, 1956, LOS: 108 ADMISSION DATE:  09/01/2021, CONSULTATION DATE: 09/14/2021 REFERRING MD: Dr. Marylyn Ishihara, CHIEF COMPLAINT: Encephalopathy  BRIEF  65 year old woman with very severe COPD, associated chronic hypoxemic respiratory failure (4-8 L/min), hypertension, hyperlipidemia.  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 She was noted to have altered mental status on the morning of 10/17, appeared to be normal when she went to bed the night before.  In the emergency department she was tachycardic, tachypneic with pancytopenia.  Last chemotherapy infusion appears to have been 09/05/2021.  Acute metabolic encephalopathy.  She was treated empirically for possible sepsis, source unknown. Home med list with Percocet, Neurontin.  Her husband notes that he does not believe she is been taking any of her medicines for the last 2 to 3 days.   Pertinent  Medical History   Past Medical History:  Diagnosis Date   Angioedema    COPD (chronic obstructive pulmonary disease) (HCC)    FHx: migraine headaches    Hyperlipidemia    Hypertension    Seasonal allergies     Significant Hospital Events: Including procedures, antibiotic start and stop dates in addition to other pertinent events   CT abdomen pelvis 10/17 >> no acute findings Head CT 10/17 >> normal CT-PA chest 10/17 >> no new infiltrates or significant change in the left upper lobe mass.  No PE MRI brain 10/17 >> Motion degradation compromise interpretation, no acute or subacute infarct, no mass lesion  Progressive left upper lobe primary bronchogenic carcinoma with increased destruction of the anterior left second rib. New perilymphatic nodularity in the left upper lobe adjacent to the mass, consistent with lymphangitic carcinomatosis. 3. 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  EEG 10/18 with  generalized slowing, no evidence of active seizures or epileptiform activity 10/18 acyclovir and ampicillin added to cover possible meningitis 10/19 SVT, likely A. fib/flutter noted after adenosine given.  Hypotension with diltiazem, then started amiodarone.  Back in sinus tachycardia 10/20 - SVT with what looks like A. fib/flutter after adenosine given early evening 10/19.  No real response to diltiazem infusion, then given amiodarone.  Back in sinus tachycardia, . \atient continued to be aphasic and to have agitation through the day 10/19.  Now off Precedex. I/O positive 8.7 L total. Has had hypokalemia, replaced. WBC increasing, 2.9 this morning 10/20  10/21 -husband and son at the bedside.  They tell me that after she had COVID in 2020 she has been on 6 L oxygen.  For the last 3 months before admissions she has had 35 pound weight loss approximately.  August 2022 was diagnosed with cancers in September 2022 was on chemoradiation.  Since chemoradiation there is more failure to thrive with progressive decrease in activity particularly after chemoradiation cycles. Currently on 10 L nasal cannula pulse ox 95%.  Has not been on BiPAP.  Has not been on the ventilator.  Is not on pressors.  Not on Precedex  10/22 -had severe worsening of respiratory acidosis acute on chronic with a pH of 7.22 and a PCO2 of 90 associated with worsening bilateral lower lobe atelectasis [has left upper lobe cancer] on chest x-ray compared to 09/17/2021.  Treated with BiPAP.  Blood gas subsequent improvement but did not tolerate BiPAP.  Nonetheless mental status today better.  Husband also acknowledges that she is looking better.  Cachectic elderly female.  Significant physical deconditioning .  currently on 6 L nasal cannula pulse ox 95%. Continues on IV heparin infusion and amio infusion.  On acyclovir and ampicillin and ceftriaxone for potential meningitis.  Afebrile since 09/13/2021 but leukopenic with a white count of 2.4K.   Culture negative so far Pall care consult - full code + but family meeting being arranged  09/22/21 - Today patient states she is nauseated. RN had to increase her oxygen from 6 L to 8 L . She has some skin burning/ pain from radiation on her left chest and back. Dr. Earlie Server spoke with patient 10/27 about the fact he did not feel she was a good candidate for further chemotherapy, or targeted therapy, because of her poor performance status and worsening respiratory failure. Palliation was consulted, but patient remains a Full Code.  Family are at bedside  11/2 doing a little better w/ night time BIPAP ~ 4 hrs. Oxygen requirements had been down to 6, now closer to 10  after exertion. IVFs added back on 11/1      Interim History / Subjective:   Feels OK. No distress  Objective   Blood pressure 123/68, pulse 90, temperature 98.5 F (36.9 C), temperature source Oral, resp. rate 16, height _0  (1.753 m), weight 85.4 kg, SpO2 97 %.      Estimated body mass index is 27.8 kg/m as calculated from the following:   Height as of this encounter: _1  (1.753 m).   Weight as of this encounter: 85.4 kg.   Intake/Output Summary (Last 24 hours) at 09/27/2021 1115 Last data filed at 09/27/2021 0700 Gross per 24 hour  Intake 1862.9 ml  Output 1500 ml  Net 362.9 ml   Filed Weights   09/16/21 1200 09/16/21 1600 09/18/21 0500  Weight: 75 kg 75 kg 85.4 kg    Examination:  General this a debilitated 65 year old female. Resting in bed HENT NCAT Pulm some basilar rales. No Rhonchi Card rrr Abd soft Ext trace LE edema Neuro intact  Resolved Hospital Problem list     Assessment & Plan:   1) Acute on chronic respiratory failure with hypoxia and hypercapnia 2) Acute COPD exacerbation  3) progressive lung adenocarcinoma: Stage IIIA (T4, N1, M0)  adenocarcinoma - presented with large left upper lobe lung mass with chest wall invasion in addition to left suprahilar lymphadenopathy diagnosed in  August 2022.  Despite treatment she has had progression with rib destruction. No further chemo or biologic Rx  per Dr Julien Nordmann dsicussion with ccm  4) GOLD IV/class D COPD: Her baseline lung function is severely impaired (FEV1 24% predicted in 2021) Acute Encephalopathy w/ agitated Delirium (resolved) 5) SVT & PAF: currently NSR 6) Chronic anemia, stable 7) Acute thrombocytopenia 8) Acute leukopenia 9) Hyponatremia, likely hypervolemic- resolved 10) Hypokalemia, resolved 11) Mild hypomagnesmia 12 ) AKI 13) adult Failure to thrive 14) severe protein calorie malnutrition 15) cancer related pain and chronic opioid use w/ acute on chronic pain  16) physical deconditioning   Pulmonary Problem list  Acute on chronic hypoxic and hypercarbic respiratory failure. Multifactorial. Initially d/t AECOPD. Now really more chronic: advanced COPD, advanced adenocarcinoma w/ fairly significant structural damage to the left lung, and suspect some degree of secondary PH (although looks like RV size nml on recent ECHO.) Little to be done in regards to improvement.  -I used a car engine to explain her current condition. At one time her body was running on a 6 cylinder engine and now she is functioning on 2. I  am doubtful we can get those 4 cylinders back so we need to focus on how we maximize the last 2. She seemed to understand this. We did briefly discuss trach/vent. I do not think she is a good candidate for trach and she indicates to me that she would not want long term vent or trach.   Plan Cont NIPPV at night and at rest during the day (I recommended to her to look at this therapy as a "recharge") would love to see her doing at least 6 hrs a day Cont BDs: when home should be on triple inhaled rx LABA/ICS/anticholinergic  Cont to slowly wean pred Cont PT/OT-->would she be candidate for LTAC Add IS and flutter XRT per rad/onc  Lasix on hold-->getting IVFs. Will get CXR in am I worry she is at risk for  volume overload easily Might need to consider Oximizer for home.  Might be helpful to re-involve palliative (seems like she and her family had been considering palliative care at dc; I wonder how extensive the discussion about long term ventilation w/ extended family was; as the patient seems to indicate this would not be something she would want.     Best Practice (right click and "Reselect all SmartList Selections" daily)   Diet/type: Regular consistency (see orders) DVT prophylaxis: other GI prophylaxis: N/A Lines: N/A Foley:  N/A Code Status:  full code Last date of multidisciplinary goals of care discussion [10/27-- son, niece, patient. She remains full code at this time, updated son 09/25/21  - son updated over phone by CCM.]  Erick Colace ACNP-BC [place] Pager # [phone] OR # [phone] if no answer

## 2021-09-28 ENCOUNTER — Ambulatory Visit: Payer: 59

## 2021-09-28 ENCOUNTER — Encounter: Payer: Self-pay | Admitting: Radiation Oncology

## 2021-09-28 ENCOUNTER — Inpatient Hospital Stay (HOSPITAL_COMMUNITY): Payer: 59

## 2021-09-28 ENCOUNTER — Ambulatory Visit
Admission: RE | Admit: 2021-09-28 | Discharge: 2021-09-28 | Disposition: A | Discharge status: Home / Self Care | Payer: 59 | Source: Ambulatory Visit | Attending: Radiation Oncology | Admitting: Radiation Oncology

## 2021-09-28 DIAGNOSIS — J9621 Acute and chronic respiratory failure with hypoxia: Secondary | ICD-10-CM | POA: Diagnosis not present

## 2021-09-28 DIAGNOSIS — J9622 Acute and chronic respiratory failure with hypercapnia: Secondary | ICD-10-CM | POA: Diagnosis not present

## 2021-09-28 DIAGNOSIS — C3492 Malignant neoplasm of unspecified part of left bronchus or lung: Secondary | ICD-10-CM | POA: Diagnosis not present

## 2021-09-28 DIAGNOSIS — Z7189 Other specified counseling: Secondary | ICD-10-CM | POA: Diagnosis not present

## 2021-09-28 DIAGNOSIS — R6510 Systemic inflammatory response syndrome (SIRS) of non-infectious origin without acute organ dysfunction: Secondary | ICD-10-CM | POA: Diagnosis not present

## 2021-09-28 LAB — CBC WITH DIFFERENTIAL/PLATELET
Abs Immature Granulocytes: 0.1 10*3/uL — ABNORMAL HIGH (ref 0.00–0.07)
Basophils Absolute: 0 10*3/uL (ref 0.0–0.1)
Basophils Relative: 0 %
Eosinophils Absolute: 0 10*3/uL (ref 0.0–0.5)
Eosinophils Relative: 1 %
HCT: 28.1 % — ABNORMAL LOW (ref 36.0–46.0)
Hemoglobin: 8 g/dL — ABNORMAL LOW (ref 12.0–15.0)
Immature Granulocytes: 2 %
Lymphocytes Relative: 3 %
Lymphs Abs: 0.2 10*3/uL — ABNORMAL LOW (ref 0.7–4.0)
MCH: 26.4 pg (ref 26.0–34.0)
MCHC: 28.5 g/dL — ABNORMAL LOW (ref 30.0–36.0)
MCV: 92.7 fL (ref 80.0–100.0)
Monocytes Absolute: 0.6 10*3/uL (ref 0.1–1.0)
Monocytes Relative: 10 %
Neutro Abs: 5.2 10*3/uL (ref 1.7–7.7)
Neutrophils Relative %: 84 %
Platelets: 117 10*3/uL — ABNORMAL LOW (ref 150–400)
RBC: 3.03 MIL/uL — ABNORMAL LOW (ref 3.87–5.11)
RDW: 19.1 % — ABNORMAL HIGH (ref 11.5–15.5)
WBC: 6.1 10*3/uL (ref 4.0–10.5)
nRBC: 0 % (ref 0.0–0.2)

## 2021-09-28 LAB — BASIC METABOLIC PANEL
Anion gap: 4 — ABNORMAL LOW (ref 5–15)
BUN: 20 mg/dL (ref 8–23)
CO2: 42 mmol/L — ABNORMAL HIGH (ref 22–32)
Calcium: 8 mg/dL — ABNORMAL LOW (ref 8.9–10.3)
Chloride: 95 mmol/L — ABNORMAL LOW (ref 98–111)
Creatinine, Ser: 1.27 mg/dL — ABNORMAL HIGH (ref 0.44–1.00)
GFR, Estimated: 48 mL/min — ABNORMAL LOW (ref >60)
Glucose, Bld: 90 mg/dL (ref 70–99)
Potassium: 4.7 mmol/L (ref 3.5–5.1)
Sodium: 141 mmol/L (ref 135–145)

## 2021-09-28 LAB — BLOOD GAS, ARTERIAL
Acid-Base Excess: 12.8 mmol/L — ABNORMAL HIGH (ref 0.0–2.0)
Bicarbonate: 41.1 mmol/L — ABNORMAL HIGH (ref 20.0–28.0)
Drawn by: 331471
FIO2: 48
O2 Content: 7 L/min
O2 Saturation: 89.1 %
Patient temperature: 98.6
pCO2 arterial: 85.3 mmHg (ref 32.0–48.0)
pH, Arterial: 7.304 — ABNORMAL LOW (ref 7.350–7.450)
pO2, Arterial: 58.1 mmHg — ABNORMAL LOW (ref 83.0–108.0)

## 2021-09-28 LAB — MAGNESIUM: Magnesium: 2 mg/dL (ref 1.7–2.4)

## 2021-09-28 IMAGING — DX DG CHEST 1V PORT
1 series · 1 of 1 positions shown · non-contrast
Comparison: [DATE], [DATE]

CLINICAL DATA: 64-year-old female with respiratory failure.

EXAM:
PORTABLE CHEST - 1 VIEW

[chest ap]
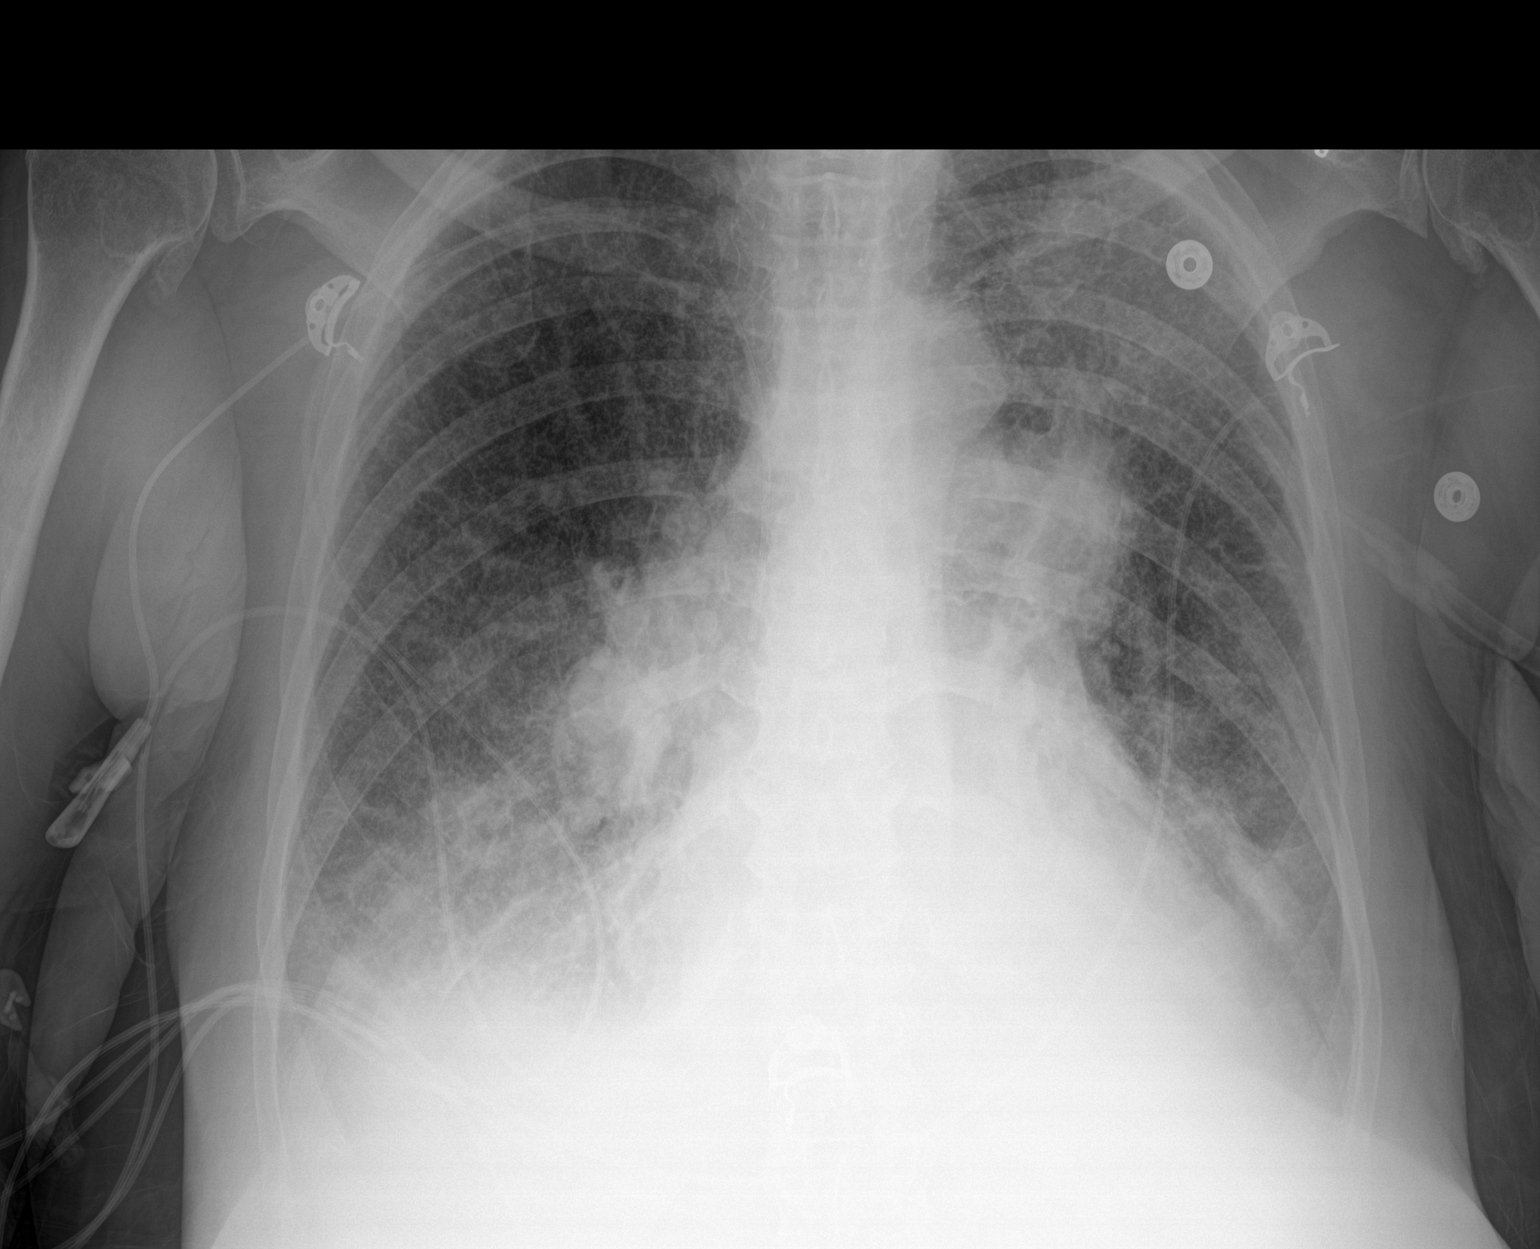

[1 of 1 positions shown; findings below may reference images not displayed]

FINDINGS: Slightly worsened mild diffuse obscuration of the mediastinal
silhouette. No cardiomegaly. Right upper extremity PICC in place
with the tip near the cavoatrial junction. Interval increased
bilateral hilar opacities. Similar appearing left upper lobe diffuse
opacity. Increased bibasilar hazy opacities. Similar appearing
obscuration of the bilateral costophrenic angles. No acute osseous
abnormality.
IMPRESSION: Interval increased perihilar and bibasilar opacities as could be
seen with worsening atelectasis, pulmonary edema, or
infectious/inflammatory etiology superimposed upon left upper lobe
neoplasm. Similar appearing trace bilateral pleural effusions.

## 2021-09-28 MED ORDER — FUROSEMIDE 10 MG/ML IJ SOLN: 40.0000 mg | Freq: Once | INTRAMUSCULAR | Status: DC

## 2021-09-28 MED ORDER — FUROSEMIDE 10 MG/ML IJ SOLN
20.0000 mg | Freq: Once | INTRAMUSCULAR | Status: AC
  Administered 2021-09-28: 20 mg via INTRAVENOUS
  Filled 2021-09-28: qty 2

## 2021-09-28 NOTE — Progress Notes (Signed)
Pt placed on BIPAP( [facility]) due to ABG.

## 2021-09-28 NOTE — Progress Notes (Addendum)
PROGRESS NOTE    Patricia Sampson  XQJ:[id number] DOB: Feb 21, 1956 DOA: 09/01/2021 PCP: Greig Right, MD  Brief Narrative: Patricia Sampson is a 65 y.o. female with PMH Stage IIIa LUL adenocarcinoma, chronic hypoxic respiratory failure 6L O2 at home, HTN, HLD who presented with AMS. She underwent extensive workup but ultimately an LP was declined. She was treated empirically with abx and acyclovir for encephalitis and her mentation improved.     Assessment & Plan:   Active Problems:   COPD (chronic obstructive pulmonary disease) (HCC)   Adenocarcinoma of left lung, stage 3 (HCC)   PAF (paroxysmal atrial fibrillation) (HCC)   Acute on chronic respiratory failure with hypoxia and hypercapnia (HCC)   Palliative care by specialist   Goals of care, counseling/discussion   Normocytic anemia   AKI (acute kidney injury) ([place])  #1 acute on chronic hypoxic hypercapnic respiratory failure patient to 80s on 6 L of oxygen at home goes up to 8 L with activity.  Patient currently requiring 10 L of O2 as her saturations dropped to 80s on 6 L. Chest x-ray 09/28/2021 interval increase perihilar bibasilar opacities worsening atelectasis of pulmonary edema or infectious or inflammatory etiology similar-appearing trace bilateral pleural effusions. Lasix 20 mg IV x1 Patient and the family wants to be a full code She has stage III adenocarcinoma of the left upper lobe with invasion of the chest wall and left suprahilar lymphadenopathy diagnosed in August 2022. Patient is not a candidate for any further chemotherapy and oncology has recommended palliative care/hospice  08/27/2021 CT -  Progressive left upper lobe primary bronchogenic carcinoma with increased destruction of the anterior left second rib. New perilymphatic nodularity in the left upper lobe adjacent to the mass, consistent with lymphangitic carcinomatosis. 3. 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 Reconsulted PALLIATIVE.  ABG  today-7.3 0/85/58 Patient placed back on BiPAP Trial of trilegy overnight to get her get used to it prior to discharge home.  #2 paroxysmal atrial fibrillation on Eliquis since 09/20/2021 continue amiodarone.  Plan is to have a 30-day outpatient event monitor.  #3 AKI creatinine is trending up continue IV fluids while on acyclovir Creatinine 1.27 down from 1.50 yesterday DC IV fluids.  #4 acute metabolic encephalopathy has been resolved, multifactorial etiology   Estimated body mass index is 27.8 kg/m as calculated from the following:   Height as of this encounter: _0  (1.753 m).   Weight as of this encounter: 85.4 kg.  DVT prophylaxis: Eliquis  code Status: Full code  family Communication: None  disposition Plan:  Status is: Inpatient  Remains inpatient appropriate because: Hypoxic respiratory failure with hypercapnia    Consultants:  PCCM  Procedures: None  Subjective:  Patient is very anxious this morning her daughter is in the room and spend the night with her she was anxious on the CPAP she is requesting to get physical therapy  Objective: Vitals:   09/28/21 0431 09/28/21 0820 09/28/21 1355 09/28/21 1413  BP: 140/78   123/61  Pulse: 99   (!) 106  Resp: (!) 22   20  Temp: 97.7 F (36.5 C)   97.8 F (36.6 C)  TempSrc: Oral     SpO2: 96% 91% 91% 95%  Weight:      Height:        Intake/Output Summary (Last 24 hours) at 09/28/2021 1522 Last data filed at 09/28/2021 0439 Gross per 24 hour  Intake 600 ml  Output 1100 ml  Net -500 ml  Filed Weights   09/16/21 1200 09/16/21 1600 09/18/21 0500  Weight: 75 kg 75 kg 85.4 kg    Examination:  General exam: Appears calm and comfortable  Respiratory system: few rhonchi to auscultation. Respiratory effort normal. Cardiovascular system: S1 & S2 heard, RRR. No JVD, murmurs, rubs, gallops or clicks. No pedal edema. Gastrointestinal system: Abdomen is nondistended, soft and nontender. No organomegaly or masses  felt. Normal bowel sounds heard. Central nervous system: Alert and oriented. No focal neurological deficits. Extremities: Symmetric 5 x 5 power. Skin: No rashes, lesions or ulcers Psychiatry: Judgement and insight appear normal. Mood & affect appropriate.     Data Reviewed: I have personally reviewed following labs and imaging studies  CBC: Recent Labs  Lab 09/24/21 0458 09/24/21 2040 09/25/21 0316 09/26/21 0358 09/27/21 0521 09/28/21 0357  WBC 5.1  --  5.5 5.2 5.3 6.1  NEUTROABS 4.1  --  4.5 4.2 4.5 5.2  HGB 7.1* 8.7* 8.0* 8.2* 8.0* 8.0*  HCT 24.2* 29.1* 27.0* 28.3* 27.5* 28.1*  MCV 88.3  --  88.2 90.1 91.4 92.7  PLT 139*  --  135* 129* 117* 117*    Basic Metabolic Panel: Recent Labs  Lab 09/24/21 0458 09/25/21 0316 09/26/21 0358 09/27/21 0521 09/28/21 0357  NA 137 137 137 138 141  K 3.5 3.8 4.2 4.2 4.7  CL 85* 85* 86* 91* 95*  CO2 >45* >45* 43* 44* 42*  GLUCOSE 85 80 116* 98 90  BUN _0 CREATININE 1.25* 1.42* 1.32* 1.50* 1.27*  CALCIUM 7.7* 7.7* 8.0* 8.0* 8.0*  MG 1.9 1.8 2.2 2.0 2.0    GFR: Estimated Creatinine Clearance: 52.9 mL/min (A) (by C-G formula based on SCr of 1.27 mg/dL (H)). Liver Function Tests: No results for input(s): AST, ALT, ALKPHOS, BILITOT, PROT, ALBUMIN in the last 168 hours. No results for input(s): LIPASE, AMYLASE in the last 168 hours. No results for input(s): AMMONIA in the last 168 hours. Coagulation Profile: No results for input(s): INR, PROTIME in the last 168 hours. Cardiac Enzymes: No results for input(s): CKTOTAL, CKMB, CKMBINDEX, TROPONINI in the last 168 hours. BNP (last 3 results) No results for input(s): PROBNP in the last 8760 hours. HbA1C: No results for input(s): HGBA1C in the last 72 hours. CBG: No results for input(s): GLUCAP in the last 168 hours. Lipid Profile: No results for input(s): CHOL, HDL, LDLCALC, TRIG, CHOLHDL, LDLDIRECT in the last 72 hours. Thyroid Function Tests: No results for  input(s): TSH, T4TOTAL, FREET4, T3FREE, THYROIDAB in the last 72 hours. Anemia Panel: No results for input(s): VITAMINB12, FOLATE, FERRITIN, TIBC, IRON, RETICCTPCT in the last 72 hours. Sepsis Labs: No results for input(s): PROCALCITON, LATICACIDVEN in the last 168 hours.  No results found for this or any previous visit (from the past 240 hour(s)).    Radiology Studies: DG Chest Port 1 View  Result Date: 09/28/2021 CLINICAL DATA:  65 year old female with respiratory failure. EXAM: PORTABLE CHEST - 1 VIEW COMPARISON:  09/22/2021, 09/05/2021 FINDINGS: Slightly worsened mild diffuse obscuration of the mediastinal silhouette. No cardiomegaly. Right upper extremity PICC in place with the tip near the cavoatrial junction. Interval increased bilateral hilar opacities. Similar appearing left upper lobe diffuse opacity. Increased bibasilar hazy opacities. Similar appearing obscuration of the bilateral costophrenic angles. No acute osseous abnormality. IMPRESSION: Interval increased perihilar and bibasilar opacities as could be seen with worsening atelectasis, pulmonary edema, or infectious/inflammatory etiology superimposed upon left upper lobe neoplasm. Similar appearing trace bilateral pleural effusions. Electronically Signed  By: Ruthann Cancer M.D.   On: 09/28/2021 08:17     Scheduled Meds:  amiodarone  200 mg Oral Daily   apixaban  5 mg Oral BID   chlorhexidine  15 mL Mouth Rinse BID   Chlorhexidine Gluconate Cloth  6 each Topical Daily   feeding supplement  237 mL Oral TID BM   gabapentin  100 mg Oral TID   ipratropium-albuterol  3 mL Nebulization TID   mouth rinse  15 mL Mouth Rinse q12n4p   predniSONE  20 mg Oral Q breakfast   PruTect   Topical BID   sodium chloride flush  10-40 mL Intracatheter Q12H   vitamin B-12  1,000 mcg Oral Daily   Continuous Infusions:  sodium chloride 10 mL/hr at 09/22/21 0559   sodium chloride Stopped (09/18/21 0523)   sodium chloride 75 mL/hr at 09/28/21  1010     LOS: 17 days    Georgette Shell, MD 09/28/2021, 3:22 PM

## 2021-09-28 NOTE — Progress Notes (Signed)
Daily Progress Note   Patient Name: Patricia Sampson       Date: 09/28/2021 DOB: [birth year]  Age: 65 y.o. MRN#: [id number] Attending Physician: Georgette Shell, MD Primary Care Physician: Greig Right, MD Admit Date: 09/03/2021  Reason for Consultation/Follow-up: Establishing goals of care  Subjective: I saw and examined Patricia Sampson.  Her husband was at the bedside as was rep setting up Trilogy vent.  She had tolerated it well for 45 minutes.  She reports that her last radiation is tomorrow.  She desperately wants to go home after this.  We discussed her overall condition and concern about her continued respiratory failure.  Discussed concern that she is becoming hypercapnic even during the day.  She is very clear that she is not interested in tracheostomy, but she is also not interested in consideration for hospice support at this time.  Discussed that this is a very difficult situation overall and I am concerned about how she is going to fare moving forward.  She and husband agree that they need to see how she does with trilogy tonight but at this point are clear in wanting aggressive interventions (but not trach).  Length of Stay: 17  Current Medications: Scheduled Meds:  . amiodarone  200 mg Oral Daily  . apixaban  5 mg Oral BID  . chlorhexidine  15 mL Mouth Rinse BID  . Chlorhexidine Gluconate Cloth  6 each Topical Daily  . feeding supplement  237 mL Oral TID BM  . gabapentin  100 mg Oral TID  . ipratropium-albuterol  3 mL Nebulization TID  . mouth rinse  15 mL Mouth Rinse q12n4p  . predniSONE  20 mg Oral Q breakfast  . PruTect   Topical BID  . sodium chloride flush  10-40 mL Intracatheter Q12H  . vitamin B-12  1,000 mcg Oral Daily    Continuous Infusions: . sodium  chloride 10 mL/hr at 09/22/21 0559  . sodium chloride Stopped (09/18/21 0523)    PRN Meds: sodium chloride, acetaminophen **OR** acetaminophen, ammonium lactate, fentaNYL (SUBLIMAZE) injection, ipratropium-albuterol, LORazepam, magic mouthwash, ondansetron **OR** ondansetron (ZOFRAN) IV, oxyCODONE, sodium chloride flush  Physical Exam         General: awake alert oriented, converses appropriately.  HEENT: now on O2 [place].  heart: Regular rate and rhythm. No murmur appreciated. Lungs:  Diminished air movement, clear Abdomen: Soft, nontender, nondistended, positive bowel sounds.   Ext: No significant edema Skin: Warm and dry  Vital Signs: BP 123/61 (BP Location: Right Arm)   Pulse (!) 106   Temp 97.8 F (36.6 C)   Resp 20   Ht _0  (1.753 m)   Wt 85.4 kg   SpO2 93%   BMI 27.80 kg/m  SpO2: SpO2: 93 % O2 Device: O2 Device: High Flow Nasal Cannula (salter, non-heated) O2 Flow Rate: O2 Flow Rate (L/min): 7 L/min  Intake/output summary:  Intake/Output Summary (Last 24 hours) at 09/28/2021 2130 Last data filed at 09/28/2021 1940 Gross per 24 hour  Intake 600 ml  Output 1350 ml  Net -750 ml    LBM: Last BM Date: 09/27/21 Baseline Weight: Weight: 75 kg Most recent weight: Weight: 85.4 kg       Palliative Assessment/Data:    Flowsheet Rows    Flowsheet Row Most Recent Value  Intake Tab   Referral Department Hospitalist  Unit at Time of Referral ICU  Palliative Care Primary Diagnosis Sepsis/Infectious Disease  Date Notified 09/15/21  Palliative Care Type New Palliative care  Reason for referral Clarify Goals of Care  Date of Admission 09/09/2021  Date first seen by Palliative Care 09/16/21  # of days Palliative referral response time 1 Day(s)  # of days IP prior to Palliative referral 4  Clinical Assessment   Palliative Performance Scale Score 40%  Psychosocial & Spiritual Assessment   Palliative Care Outcomes        Patient Active Problem List   Diagnosis Date  Noted  . Normocytic anemia 09/23/2021  . AKI (acute kidney injury) ([place]) 09/23/2021  . Palliative care by specialist   . Goals of care, counseling/discussion   . Acute on chronic respiratory failure with hypoxia and hypercapnia (HCC)   . PAF (paroxysmal atrial fibrillation) ([place])   . Microcytic anemia 08/21/2021  . Adenocarcinoma of left lung, stage 3 ([place]) 07/19/2021  . Encounter for antineoplastic chemotherapy 07/19/2021  . Cancer associated pain 07/19/2021  . Pneumonia due to COVID-19 virus 11/25/2019  . Shortness of breath 11/18/2019  . Chronic respiratory failure with hypoxia ([place]) 11/18/2019  . COPD (chronic obstructive pulmonary disease) ([place]) 01/27/2014  . Hypoxemia 01/27/2014  . Secondary pulmonary arterial hypertension ([place]) 01/27/2014    Palliative Care Assessment & Plan   Patient Profile: 65 y.o. female  with past medical history of severe COPD, chronic hypoxic respiratory failure on 4-8 L, hypertension, hyperlipidemia with recent diagnosis of stage III adenocarcinoma with large left upper lobe mass and chest wall invasion in August of this year who is currently undergoing chemoradiation admitted on 09/01/2021 with altered mental status with concern for sepsis.  Her mental status over the past several days has remained poor and far from her baseline.  Palliative consulted for goals of care.  Recommendations/Plan: Full code/full scope Discussed concerns about her continued respiratory failure.  She is clear in not wanting to consider trach and she reports being optimistic about how she is going to do with home trilogy.  I'm not sure she really grasps concern that she is also becoming hypercapnic during the day and the seriousness of this.  Will plan to follow up tomorrow to continue conversation and see how she tolerates trilogy this evening.    Goals of Care and Additional Recommendations: Limitations on Scope of Treatment: Full Scope Treatment  Code Status:    Code  Status Orders  (From admission, onward)  Start     Ordered   09/17/2021 2128  Full code  Continuous        09/17/2021 2127           Code Status History     This patient has a current code status but no historical code status.       Prognosis:  Unable to determine  Discharge Planning: To Be Determined  Care plan was discussed with patient and husband   Thank you for allowing the Palliative Medicine Team to assist in the care of this patient.   Total Time 50 Prolonged Time Billed No   Greater than 50%  of this time was spent counseling and coordinating care related to the above assessment and plan.   Micheline Rough, MD  Please contact Palliative Medicine Team phone at [phone] for questions and concerns.

## 2021-09-28 NOTE — Progress Notes (Signed)
  PCo2 - now upto 85. Running high  pco2 despite bipap  Plan  - back on bipap - d/w RN   - this is becoming very difficult to manage and she is very tenuous -> trach was offered yesterday but she declined and do not think is in her best interest  - d/w Dr Domingo Cocking - and shared opinion - he will talk to family  - family needs to undsrsnd that she is at high risk for resp arrest and mode of death through CPR    SIGNATURE    Dr. Brand Males, M.D., F.C.C.P,  Pulmonary and Critical Care Medicine Staff Physician, [place] Director - Interstitial Lung Disease  Program  [facility] [place] [facility] [place], [place], [address]  NPI Number:  NPI #[id number]  Pager: [phone], If no answer  -> Check AMION or Try [phone] Telephone (clinical office): [phone] Telephone (research): [place]  1:12 PM 09/28/2021'  Recent Labs  Lab 09/25/21 1256 09/28/21 1155  PHART 7.389 7.304*  PCO2ART 76.9* 85.3*  PO2ART 58.4* 58.1*  HCO3 45.4* 41.1*  O2SAT 90.1 89.1

## 2021-09-28 NOTE — Progress Notes (Signed)
Physical Therapy Treatment Patient Details Name: Patricia Sampson MRN: [id number] DOB: 02/08/1956 Today's Date: 09/28/2021   History of Present Illness Patricia Sampson is a 65 y.o. female with PMH Stage IIIa LUL adenocarcinoma, chronic hypoxic respiratory failure 6L O2 at home, HTN, HLD who presented with AMS 09/21/2021,    PT Comments    Pt NOT progressing. Pt AxO x 3 very sweet lady but also worried/anxious that she is not progressing with her mobility.  Pt on 7 lts HFNC at 94%.  Assisted to EOB sats decreased to low 80"s and maintained despite purse lip breathing and ceasing activity.  Called RN to room. Assisted to [facility] as pt was incont stool.  General transfer comment: pt required increased assist from elevated bed to [facility] and even greater assist from lower BSC to recliner.Assisted with peri care as pt was able to stand a few seconds.  General Gait Details: pt was only able to take a few steps from [facility] to recliner with walker.  RN in room to observe as O2 sats decreased to 77%.  RN gave gave me permission to increase HFNC to 12 lts.  Left pt in recliner with RN still in room.     Recommendations for follow up therapy are one component of a multi-disciplinary discharge planning process, led by the attending physician.  Recommendations may be updated based on patient status, additional functional criteria and insurance authorization.  Follow Up Recommendations  Home health PT     Assistance Recommended at Discharge    Equipment Recommendations  Wheelchair (measurements PT);Wheelchair cushion (measurements PT)    Recommendations for Other Services       Precautions / Restrictions Precautions Precautions: Fall Precaution Comments: monitor vitals     Mobility  Bed Mobility Overal bed mobility: Needs Assistance Bed Mobility: Supine to Sit     Supine to sit: Mod assist;Max assist     General bed mobility comments: pt required increased assist due to increased c/o fatigue/weakness  and dyspnea.  EOB sats decreased to low 80's.    Transfers Overall transfer level: Needs assistance Equipment used: None;1 person hand held assist Transfers: Sit to/from [facility] Sit to Stand: Mod assist Stand pivot transfers: Mod assist;Max assist         General transfer comment: pt required increased assist from elevated bed to [facility] and even greater assist from lower BSC to recliner.    Ambulation/Gait Ambulation/Gait assistance: Mod assist Gait Distance (Feet): 2 Feet   Gait Pattern/deviations: Step-to pattern;Decreased step length - right;Decreased step length - left Gait velocity: decreased   General Gait Details: pt was only able to take a few steps from [facility] to recliner with walker.  RN in room to observe as O2 sats decreased to 77%.  RN gave gave me permission to increase HFNC to 12 lts.   Stairs             Wheelchair Mobility    Modified Rankin (Stroke Patients Only)       Balance                                            Cognition Arousal/Alertness: Awake/alert Behavior During Therapy: Anxious Overall Cognitive Status: Within Functional Limits for tasks assessed  General Comments: AxO x 3 very sweet lady but she is also worried she is not progressing        Exercises      General Comments        Pertinent Vitals/Pain Pain Assessment: No/denies pain    Home Living                          Prior Function            PT Goals (current goals can now be found in the care plan section) Progress towards PT goals: Progressing toward goals    Frequency    Min 3X/week      PT Plan Current plan remains appropriate    Co-evaluation              AM-PAC PT "6 Clicks" Mobility   Outcome Measure  Help needed turning from your back to your side while in a flat bed without using bedrails?: A Lot Help needed moving from lying on your back  to sitting on the side of a flat bed without using bedrails?: A Lot Help needed moving to and from a bed to a chair (including a wheelchair)?: A Lot Help needed standing up from a chair using your arms (e.g., wheelchair or bedside chair)?: A Lot Help needed to walk in hospital room?: A Lot Help needed climbing 3-5 steps with a railing? : A Lot 6 Click Score: 12    End of Session Equipment Utilized During Treatment: Gait belt;Oxygen Activity Tolerance: Patient limited by fatigue;Other (comment) (destat with activity) Patient left: in chair;with chair alarm set;with family/visitor present;with call bell/phone within reach Nurse Communication: Mobility status (RN in room) PT Visit Diagnosis: Unsteadiness on feet (R26.81);Muscle weakness (generalized) (M62.81)     Time: 0262-8549 PT Time Calculation (min) (ACUTE ONLY): 36 min  Charges:  $Gait Training: 8-22 mins $Therapeutic Activity: 8-22 mins                     Rica Koyanagi  PTA Acute  Rehabilitation Services Pager      [phone] Office      [phone]

## 2021-09-28 NOTE — TOC Progression Note (Signed)
Transition of Care [facility]) - Progression Note    Patient Details  Name: Patricia Sampson MRN: [id number] Date of Birth: January 30, 1956  Transition of Care [facility]) CM/SW Contact  Leeroy Cha, RN Phone Number: 09/28/2021, 3:44 PM  Clinical Narrative:    Per zack blank representative is setting up the triology for home use in afternoon.     Barriers to Discharge: Continued Medical Work up  Expected Discharge Plan and Services     Discharge Planning Services: CM Consult   Living arrangements for the past 2 months: Single Family Home                                       Social Determinants of Health (SDOH) Interventions    Readmission Risk Interventions No flowsheet data found.

## 2021-09-28 NOTE — Progress Notes (Signed)
SLP Cancellation Note  Patient Details Name: ALEKSA COLLINSWORTH MRN: [id number] DOB: 01-28-1956   Cancelled treatment:       Reason Eval/Treat Not Completed: Other (comment) (chart indicates pt at radiation at this time, reviewed notes from CCM and note palliative referral requested again, will hold on follow until after palliative)  Kathleen Lime, MS [place] Office [phone] Pager [phone]  Macario Golds 09/28/2021, 7:40 PM

## 2021-09-28 NOTE — Progress Notes (Signed)
NAME:  Patricia Sampson, MRN:  [id number], DOB:  1956/05/01, LOS: 34 ADMISSION DATE:  09/08/2021, CONSULTATION DATE: 09/14/2021 REFERRING MD: Dr. Marylyn Ishihara, CHIEF COMPLAINT: Encephalopathy  BRIEF  65 year old woman with very severe COPD, associated chronic hypoxemic respiratory failure (4-8 L/min), hypertension, hyperlipidemia.  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 She was noted to have altered mental status on the morning of 10/17, appeared to be normal when she went to bed the night before.  In the emergency department she was tachycardic, tachypneic with pancytopenia.  Last chemotherapy infusion appears to have been 09/05/2021.  Acute metabolic encephalopathy.  She was treated empirically for possible sepsis, source unknown. Home med list with Percocet, Neurontin.  Her husband notes that he does not believe she is been taking any of her medicines for the last 2 to 3 days.   Pertinent  Medical History   Past Medical History:  Diagnosis Date   Angioedema    COPD (chronic obstructive pulmonary disease) (HCC)    FHx: migraine headaches    Hyperlipidemia    Hypertension    Seasonal allergies     Significant Hospital Events: Including procedures, antibiotic start and stop dates in addition to other pertinent events   CT abdomen pelvis 10/17 >> no acute findings Head CT 10/17 >> normal CT-PA chest 10/17 >> no new infiltrates or significant change in the left upper lobe mass.  No PE MRI brain 10/17 >> Motion degradation compromise interpretation, no acute or subacute infarct, no mass lesion  Progressive left upper lobe primary bronchogenic carcinoma with increased destruction of the anterior left second rib. New perilymphatic nodularity in the left upper lobe adjacent to the mass, consistent with lymphangitic carcinomatosis. 3. 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  EEG 10/18 with  generalized slowing, no evidence of active seizures or epileptiform activity 10/18 acyclovir and ampicillin added to cover possible meningitis 10/19 SVT, likely A. fib/flutter noted after adenosine given.  Hypotension with diltiazem, then started amiodarone.  Back in sinus tachycardia 10/20 - SVT with what looks like A. fib/flutter after adenosine given early evening 10/19.  No real response to diltiazem infusion, then given amiodarone.  Back in sinus tachycardia, . \atient continued to be aphasic and to have agitation through the day 10/19.  Now off Precedex. I/O positive 8.7 L total. Has had hypokalemia, replaced. WBC increasing, 2.9 this morning 10/20  10/21 -husband and son at the bedside.  They tell me that after she had COVID in 2020 she has been on 6 L oxygen.  For the last 3 months before admissions she has had 35 pound weight loss approximately.  August 2022 was diagnosed with cancers in September 2022 was on chemoradiation.  Since chemoradiation there is more failure to thrive with progressive decrease in activity particularly after chemoradiation cycles. Currently on 10 L nasal cannula pulse ox 95%.  Has not been on BiPAP.  Has not been on the ventilator.  Is not on pressors.  Not on Precedex  10/22 -had severe worsening of respiratory acidosis acute on chronic with a pH of 7.22 and a PCO2 of 90 associated with worsening bilateral lower lobe atelectasis [has left upper lobe cancer] on chest x-ray compared to 09/09/2021.  Treated with BiPAP.  Blood gas subsequent improvement but did not tolerate BiPAP.  Nonetheless mental status today better.  Husband also acknowledges that she is looking better.  Cachectic elderly female.  Significant physical deconditioning .  currently on 6 L nasal cannula pulse ox 95%. Continues on IV heparin infusion and amio infusion.  On acyclovir and ampicillin and ceftriaxone for potential meningitis.  Afebrile since 09/13/2021 but leukopenic with a white count of 2.4K.   Culture negative so far Pall care consult - full code + but family meeting being arranged  09/22/21 - Today patient states she is nauseated. RN had to increase her oxygen from 6 L to 8 L . She has some skin burning/ pain from radiation on her left chest and back. Dr. Earlie Server spoke with patient 10/27 about the fact he did not feel she was a good candidate for further chemotherapy, or targeted therapy, because of her poor performance status and worsening respiratory failure. Palliation was consulted, but patient remains a Full Code.  Family are at bedside  11/2 doing a little better w/ night time BIPAP ~ 4 hrs. Oxygen requirements had been down to 6, now closer to 10  after exertion. IVFs added back on 11/1      Interim History / Subjective:   11/3 - 7L o2 day time. Uses [facility] BiPAP (not Cpap per RT). Wants to go home. Wants PT. Asked what would happen if despite PT if she  is unable to get better -> replied she will keep trying. She declined tracheostomy to APP yesterday and also again to this MD/   - sopcial work note 10/31 - trilogy with [facility] in progress  - IT notes 11/1 - pulse 89% on 6L with exertion and drops toi 84%- able to do minimal   Objective   Blood pressure 140/78, pulse 99, temperature 97.7 F (36.5 C), temperature source Oral, resp. rate (!) 22, height _0  (1.753 m), weight 85.4 kg, SpO2 91 %.      Estimated body mass index is 27.8 kg/m as calculated from the following:   Height as of this encounter: _1  (1.753 m).   Weight as of this encounter: 85.4 kg.   Intake/Output Summary (Last 24 hours) at 09/28/2021 1149 Last data filed at 09/28/2021 0439 Gross per 24 hour  Intake 600 ml  Output 1100 ml  Net -500 ml   Filed Weights   09/16/21 1200 09/16/21 1600 09/18/21 0500  Weight: 75 kg 75 kg 85.4 kg    Examination:  General Appearance:  Looks fatigued. Chjronically ill. ECOG 4 Head:  Normocephalic, without obvious abnormality, atraumatic Eyes:   PERRL - yes, conjunctiva/corneas - muddy     Ears:  Normal external ear canals, both ears Nose:  G tube - no but on 7L [place] Throat:  ETT TUBE - no , OG tube - no Neck:  Supple,  No enlargement/tenderness/nodules Lungs: Clear to auscultation bilaterally, Heart:  S1 and S2 normal, no murmur, CVP - no.  Pressors - no Abdomen:  Soft, no masses, no organomegaly Genitalia / Rectal:  Not done Extremities:  Extremities- intact Skin:  ntact in exposed areas . Sacral area - intac5 Neurologic:  Sedation - none bu topioid prn -> RASS - -1 . Moves all 4s - yes. CAM-ICU - neg . Orientation - x3+   LABS    PULMONARY Recent Labs  Lab 09/25/21 1256  PHART 7.389  PCO2ART 76.9*  PO2ART 58.4*  HCO3 45.4*  O2SAT 90.1    CBC Recent Labs  Lab 09/26/21 0358 09/27/21 0521 09/28/21 0357  HGB 8.2* 8.0* 8.0*  HCT 28.3* 27.5* 28.1*  WBC 5.2 5.3 6.1  PLT 129* 117* 117*  COAGULATION No results for input(s): INR in the last 168 hours.  CARDIAC  No results for input(s): TROPONINI in the last 168 hours. No results for input(s): PROBNP in the last 168 hours.   CHEMISTRY Recent Labs  Lab 09/24/21 0458 09/25/21 0316 09/26/21 0358 09/27/21 0521 09/28/21 0357  NA 137 137 137 138 141  K 3.5 3.8 4.2 4.2 4.7  CL 85* 85* 86* 91* 95*  CO2 >45* >45* 43* 44* 42*  GLUCOSE 85 80 116* 98 90  BUN _0 CREATININE 1.25* 1.42* 1.32* 1.50* 1.27*  CALCIUM 7.7* 7.7* 8.0* 8.0* 8.0*  MG 1.9 1.8 2.2 2.0 2.0   Estimated Creatinine Clearance: 52.9 mL/min (A) (by C-G formula based on SCr of 1.27 mg/dL (H)).   LIVER No results for input(s): AST, ALT, ALKPHOS, BILITOT, PROT, ALBUMIN, INR in the last 168 hours.   INFECTIOUS No results for input(s): LATICACIDVEN, PROCALCITON in the last 168 hours.   ENDOCRINE CBG (last 3)  No results for input(s): GLUCAP in the last 72 hours.       IMAGING x48h  - image(s) personally visualized  -   highlighted in bold DG Chest Port 1 View  Result  Date: 09/28/2021 CLINICAL DATA:  65 year old female with respiratory failure. EXAM: PORTABLE CHEST - 1 VIEW COMPARISON:  09/22/2021, 09/24/2021 FINDINGS: Slightly worsened mild diffuse obscuration of the mediastinal silhouette. No cardiomegaly. Right upper extremity PICC in place with the tip near the cavoatrial junction. Interval increased bilateral hilar opacities. Similar appearing left upper lobe diffuse opacity. Increased bibasilar hazy opacities. Similar appearing obscuration of the bilateral costophrenic angles. No acute osseous abnormality. IMPRESSION: Interval increased perihilar and bibasilar opacities as could be seen with worsening atelectasis, pulmonary edema, or infectious/inflammatory etiology superimposed upon left upper lobe neoplasm. Similar appearing trace bilateral pleural effusions. Electronically Signed   By: Ruthann Cancer M.D.   On: 09/28/2021 08:17      Resolved Hospital Problem list     Assessment & Plan:   1) Acute on chronic respiratory failure with hypoxia and hypercapnia 2) Acute COPD exacerbation  3) progressive lung adenocarcinoma: Stage IIIA (T4, N1, M0)  adenocarcinoma - presented with large left upper lobe lung mass with chest wall invasion in addition to left suprahilar lymphadenopathy diagnosed in August 2022.  Despite treatment she has had progression with rib destruction. No further chemo or biologic Rx  per Dr Julien Nordmann dsicussion with ccm  4) GOLD IV/class D COPD: Her baseline lung function is severely impaired (FEV1 24% predicted in 2021) Acute Encephalopathy w/ agitated Delirium (resolved) 5) SVT & PAF: currently NSR 6) Chronic anemia, stable 7) Acute thrombocytopenia 8) Acute leukopenia 9) Hyponatremia, likely hypervolemic- resolved 10) Hypokalemia, resolved 11) Mild hypomagnesmia 12 ) AKI 13) adult Failure to thrive 14) severe protein calorie malnutrition 15) cancer related pain and chronic opioid use w/ acute on chronic pain  16) physical  deconditioning - severe   Pulmonary Problem list  Acute on chronic hypoxic and hypercarbic respiratory failure. Multifactorial. Initially d/t AECOPD. Now really more chronic: advanced COPD, advanced adenocarcinoma w/ fairly significant structural damage to the left lung, and suspect some degree of secondary PH (although looks like RV size nml on recent ECHO.) Little to be done in regards to improvement.   11/2: APP  used a car engine to explain her current condition. At one time her body was running on a 6 cylinder engine and now she is functioning on 2. I am doubtful we can get  those 4 cylinders back so we need to focus on how we maximize the last 2. She seemed to understand this. We did briefly discuss trach/vent. I do not think she is a good candidate for trach and she indicates to me that she would not want long term vent or trach.   11/3 - 7L o2 need. No better. Dedonditione. ECOG4. In bed. Will benefit from trilogy QHS at home. See justficiation if they decline home hospice -  Patient's JERALINE MARCINEK with [birth year]  has chronic hypercapnic respiratory failure due to copd, post copvid is life threatening.  Previous ABG's have documented high PCO2 and spirometry reveals severe obstructive ventilatory defect.  Patient would benefit from non-invasive ventilation.  Without this therapy, the patient is at high risk of ending up with worsening symptoms, worsened respiratory failure, need for ER visits and/or recurrent hospitalizations.  Bilevel device unable to adequately support patient's nocturnal ventilation needs.  Patient would benefit from NIV therapy with set tidal volumes and pressure.   Plan Check ABG   Cont NIPPV at night and at rest prn during the day (ideal atleast 6h in 24h)  - currently onluy doing cPAP it seems (did not tolerate BiPAP last week in SDU) She wants to go home  - Option A: home with hospice (needs re-goal with family - recall plalliative) - this is probably the ideal  situation in her best interest - Option B:  If decline Option A: aim for home with PT, home palliative services, home NIPPV, home health   - Option C: LTAC or SNF Rehab (doubt a candidate for any of these) Cont BDs: when home should be on triple inhaled rx LABA/ICS/anticholinergic  Cont to slowly wean pred Cont PT/OT-->would she be candidate for LTAC Continue  IS and flutter Oncology  - XRT per rad/onc  - Med onc: family wants 2nd opinion (per Dr Julien Nordmann not a chemo candidate anmore) -> best done as opd Might need to consider Oximizer for home.  REconsult palliative (d/w Dr [name] of [facility]) - also d/w Dr Micheline Rough who will regroup  CCM will sign off   Best Practice (right click and "Reselect all SmartList Selections" daily)   Diet/type: Regular consistency (see orders) DVT prophylaxis: other GI prophylaxis: N/A Lines: N/A Foley:  N/A Code Status:  full code Last date of multidisciplinary goals of care discussion [10/27-- son, niece, patient. She remains full code at this time, updated son 09/25/21  - son updated over phone by CCM.]    SIGNATURE    Dr. Brand Males, M.D., F.C.C.P,  Pulmonary and Critical Care Medicine Staff Physician, [place] Director - Interstitial Lung Disease  Program  Pulmonary [place] at [place], [place], [address]  NPI Number:  NPI #[id number]  Pager: [phone], If no answer  -> Check AMION or Try [phone] Telephone (clinical office): [phone] Telephone (research): [phone]  12:00 PM 09/28/2021

## 2021-09-29 ENCOUNTER — Inpatient Hospital Stay (HOSPITAL_COMMUNITY): Payer: 59

## 2021-09-29 ENCOUNTER — Ambulatory Visit: Payer: 59

## 2021-09-29 DIAGNOSIS — I4891 Unspecified atrial fibrillation: Secondary | ICD-10-CM | POA: Diagnosis not present

## 2021-09-29 DIAGNOSIS — Z7189 Other specified counseling: Secondary | ICD-10-CM | POA: Diagnosis not present

## 2021-09-29 DIAGNOSIS — J9622 Acute and chronic respiratory failure with hypercapnia: Secondary | ICD-10-CM | POA: Diagnosis not present

## 2021-09-29 DIAGNOSIS — J9621 Acute and chronic respiratory failure with hypoxia: Secondary | ICD-10-CM | POA: Diagnosis not present

## 2021-09-29 DIAGNOSIS — C3492 Malignant neoplasm of unspecified part of left bronchus or lung: Secondary | ICD-10-CM | POA: Diagnosis not present

## 2021-09-29 DIAGNOSIS — R6510 Systemic inflammatory response syndrome (SIRS) of non-infectious origin without acute organ dysfunction: Secondary | ICD-10-CM | POA: Diagnosis not present

## 2021-09-29 LAB — CBC
HCT: 27.7 % — ABNORMAL LOW (ref 36.0–46.0)
Hemoglobin: 7.9 g/dL — ABNORMAL LOW (ref 12.0–15.0)
MCH: 26.9 pg (ref 26.0–34.0)
MCHC: 28.5 g/dL — ABNORMAL LOW (ref 30.0–36.0)
MCV: 94.2 fL (ref 80.0–100.0)
Platelets: 112 10*3/uL — ABNORMAL LOW (ref 150–400)
RBC: 2.94 MIL/uL — ABNORMAL LOW (ref 3.87–5.11)
RDW: 19.8 % — ABNORMAL HIGH (ref 11.5–15.5)
WBC: 7.2 10*3/uL (ref 4.0–10.5)
nRBC: 0.3 % — ABNORMAL HIGH (ref 0.0–0.2)

## 2021-09-29 LAB — BLOOD GAS, ARTERIAL
Acid-Base Excess: 13.3 mmol/L — ABNORMAL HIGH (ref 0.0–2.0)
Bicarbonate: 41.8 mmol/L — ABNORMAL HIGH (ref 20.0–28.0)
Drawn by: 270211
FIO2: 80
O2 Content: 15 L/min
O2 Saturation: 90.6 %
Patient temperature: 98.6
pCO2 arterial: 92 mmHg (ref 32.0–48.0)
pH, Arterial: 7.28 — ABNORMAL LOW (ref 7.350–7.450)
pO2, Arterial: 64.2 mmHg — ABNORMAL LOW (ref 83.0–108.0)

## 2021-09-29 LAB — BASIC METABOLIC PANEL
Anion gap: 5 (ref 5–15)
BUN: 24 mg/dL — ABNORMAL HIGH (ref 8–23)
CO2: 42 mmol/L — ABNORMAL HIGH (ref 22–32)
Calcium: 8 mg/dL — ABNORMAL LOW (ref 8.9–10.3)
Chloride: 90 mmol/L — ABNORMAL LOW (ref 98–111)
Creatinine, Ser: 1.28 mg/dL — ABNORMAL HIGH (ref 0.44–1.00)
GFR, Estimated: 47 mL/min — ABNORMAL LOW (ref >60)
Glucose, Bld: 252 mg/dL — ABNORMAL HIGH (ref 70–99)
Potassium: 4.8 mmol/L (ref 3.5–5.1)
Sodium: 137 mmol/L (ref 135–145)

## 2021-09-29 MED ORDER — SODIUM CHLORIDE 0.9 % IV BOLUS
500.0000 mL | Freq: Once | INTRAVENOUS | Status: AC | PRN
  Administered 2021-09-29: 500 mL via INTRAVENOUS

## 2021-09-29 MED ORDER — DIGOXIN 0.25 MG/ML IJ SOLN
0.2500 mg | Freq: Once | INTRAMUSCULAR | Status: DC
  Filled 2021-09-29: qty 2

## 2021-09-29 MED ORDER — AMIODARONE IV BOLUS ONLY 150 MG/100ML
150.0000 mg | Freq: Once | INTRAVENOUS | Status: AC
  Administered 2021-09-29: 150 mg via INTRAVENOUS
  Filled 2021-09-29: qty 100

## 2021-09-29 MED ORDER — AMIODARONE HCL IN DEXTROSE 360-4.14 MG/200ML-% IV SOLN
30.0000 mg/h | INTRAVENOUS | Status: DC
  Administered 2021-09-30 – 2021-10-01 (×3): 30 mg/h via INTRAVENOUS
  Filled 2021-09-29 (×3): qty 200

## 2021-09-29 MED ORDER — SODIUM CHLORIDE 0.9 % IV BOLUS: 500.0000 mL | Freq: Once | INTRAVENOUS | Status: DC | PRN

## 2021-09-29 MED ORDER — DIGOXIN 0.25 MG/ML IJ SOLN: 0.1250 mg | Freq: Once | INTRAMUSCULAR | Status: DC

## 2021-09-29 MED ORDER — ORAL CARE MOUTH RINSE
15.0000 mL | Freq: Two times a day (BID) | OROMUCOSAL | Status: DC
  Administered 2021-09-29 – 2021-10-09 (×9): 15 mL via OROMUCOSAL

## 2021-09-29 MED ORDER — METOPROLOL TARTRATE 5 MG/5ML IV SOLN
5.0000 mg | Freq: Once | INTRAVENOUS | Status: AC
  Administered 2021-09-29: 5 mg via INTRAVENOUS
  Filled 2021-09-29: qty 5

## 2021-09-29 MED ORDER — AMIODARONE HCL IN DEXTROSE 360-4.14 MG/200ML-% IV SOLN
60.0000 mg/h | INTRAVENOUS | Status: DC
  Administered 2021-09-29 (×2): 60 mg/h via INTRAVENOUS
  Filled 2021-09-29 (×2): qty 200

## 2021-09-29 NOTE — Progress Notes (Signed)
One-time IV push metoprolol 5 mg given per MD.  Will continue to monitor.  Angie Fava, RN

## 2021-09-29 NOTE — Progress Notes (Addendum)
Patient took a break from the bipap and was able to clear a moderate amount of brown sputum.  Anxiety med administered and patient able to wear the bipap again.  Able to wean oxygen back to 8L after clearing the sputum.  Patient tolerating new mask very well.

## 2021-09-29 NOTE — Progress Notes (Signed)
Daily Progress Note   Patient Name: Patricia Sampson       Date: 09/29/2021 DOB: [birth year]  Age: 65 y.o. MRN#: [id number] Attending Physician: Georgette Shell, MD Primary Care Physician: Greig Right, MD Admit Date: 09/17/2021  Reason for Consultation/Follow-up: Establishing goals of care  Subjective: I saw Patricia Sampson on 2 occasions today.  The first was shortly after she transition to ICU and her sister was present at the bedside.  The second was later in the evening when her husband was at the bedside.  During each of these, I discussed with Patricia Sampson the concern about her continued respiratory failure and recurrent hypercapnia.  We talked about NIPPV and utilization of this regularly to try and assist with this problem, but overall, it is not something that is going to go away.  Also discussed concern that hypercapnia was now happening during the day and not just nighttime need.  Discussed regarding trilogy vent and her hope to be home.  We also discussed her hope to follow-up at [facility] for second opinion for possibility of further disease modifying therapy for her underlying cancer.  Length of Stay: 18  Current Medications: Scheduled Meds:  . apixaban  5 mg Oral BID  . Chlorhexidine Gluconate Cloth  6 each Topical Daily  . feeding supplement  237 mL Oral TID BM  . gabapentin  100 mg Oral TID  . ipratropium-albuterol  3 mL Nebulization TID  . mouth rinse  15 mL Mouth Rinse BID  . predniSONE  20 mg Oral Q breakfast  . PruTect   Topical BID  . sodium chloride flush  10-40 mL Intracatheter Q12H  . vitamin B-12  1,000 mcg Oral Daily    Continuous Infusions: . sodium chloride 10 mL/hr at 09/22/21 0559  . sodium chloride Stopped (09/18/21 0523)  . amiodarone 60 mg/hr  (09/29/21 1848)   Followed by  . amiodarone    . sodium chloride      PRN Meds: sodium chloride, acetaminophen **OR** acetaminophen, ammonium lactate, fentaNYL (SUBLIMAZE) injection, ipratropium-albuterol, LORazepam, magic mouthwash, ondansetron **OR** ondansetron (ZOFRAN) IV, oxyCODONE, sodium chloride, sodium chloride flush  Physical Exam         General: awake alert oriented, converses appropriately.  On BiPAP during second encounter. HEENT: now on O2 [place].  heart: Regular rate and rhythm.  No murmur appreciated. Lungs: Diminished air movement, clear Abdomen: Soft, nontender, nondistended, positive bowel sounds.   Ext: No significant edema Skin: Warm and dry  Vital Signs: BP (!) 95/50   Pulse 82   Temp 98.1 F (36.7 C) (Axillary)   Resp 18   Ht _0  (1.753 m)   Wt 85.4 kg   SpO2 98%   BMI 27.80 kg/m  SpO2: SpO2: 98 % O2 Device: O2 Device: High Flow Nasal Cannula O2 Flow Rate: O2 Flow Rate (L/min): 10 L/min  Intake/output summary:  Intake/Output Summary (Last 24 hours) at 09/29/2021 1931 Last data filed at 09/29/2021 1844 Gross per 24 hour  Intake 220.64 ml  Output 1275 ml  Net -1054.36 ml    LBM: Last BM Date: 09/27/21 Baseline Weight: Weight: 75 kg Most recent weight: Weight: 85.4 kg       Palliative Assessment/Data:    Flowsheet Rows    Flowsheet Row Most Recent Value  Intake Tab   Referral Department Hospitalist  Unit at Time of Referral ICU  Palliative Care Primary Diagnosis Sepsis/Infectious Disease  Date Notified 09/15/21  Palliative Care Type New Palliative care  Reason for referral Clarify Goals of Care  Date of Admission 09/01/2021  Date first seen by Palliative Care 09/16/21  # of days Palliative referral response time 1 Day(s)  # of days IP prior to Palliative referral 4  Clinical Assessment   Palliative Performance Scale Score 40%  Psychosocial & Spiritual Assessment   Palliative Care Outcomes        Patient Active Problem List    Diagnosis Date Noted  . Atrial fibrillation with rapid ventricular response ([place])   . Normocytic anemia 09/23/2021  . AKI (acute kidney injury) ([place]) 09/23/2021  . Palliative care by specialist   . Goals of care, counseling/discussion   . Acute on chronic respiratory failure with hypoxia and hypercapnia (HCC)   . PAF (paroxysmal atrial fibrillation) ([place])   . Microcytic anemia 08/21/2021  . Adenocarcinoma of left lung, stage 3 ([place]) 07/19/2021  . Encounter for antineoplastic chemotherapy 07/19/2021  . Cancer associated pain 07/19/2021  . Pneumonia due to COVID-19 virus 11/25/2019  . Shortness of breath 11/18/2019  . Chronic respiratory failure with hypoxia ([place]) 11/18/2019  . COPD (chronic obstructive pulmonary disease) ([place]) 01/27/2014  . Hypoxemia 01/27/2014  . Secondary pulmonary arterial hypertension ([place]) 01/27/2014    Palliative Care Assessment & Plan   Patient Profile: 65 y.o. female  with past medical history of severe COPD, chronic hypoxic respiratory failure on 4-8 L, hypertension, hyperlipidemia with recent diagnosis of stage III adenocarcinoma with large left upper lobe mass and chest wall invasion in August of this year who is currently undergoing chemoradiation admitted on 09/16/2021 with altered mental status with concern for sepsis.  Her mental status over the past several days has remained poor and far from her baseline.  Palliative consulted for goals of care.  Recommendations/Plan: Full code/full scope I discussed with Patricia Sampson and also with her husband and sister regarding concerns about her continued respiratory failure.  Family states they understand situation, but I am not sure they truly accept the severity of her problem and the fact that this is going to continue to be an issue moving forward.  Family is focused on tolerating trilogy and trying to get home with hope that she can follow-up with [facility] and receive further disease modifying therapy. We had  previously discussed limits of care and she had previously indicated limit of care would  be no tracheostomy.  I attempted to clarify her desire for intubation again today in light of the fact that this would likely lead to tracheostomy if she was going to have her be weaned and she stopped me and stated, "you are scaring me.  Were not going to talk about that anymore right now."    Goals of Care and Additional Recommendations: Limitations on Scope of Treatment: Full Scope Treatment  Code Status:    Code Status Orders  (From admission, onward)           Start     Ordered   09/20/2021 2128  Full code  Continuous        09/23/2021 2127           Code Status History     This patient has a current code status but no historical code status.       Prognosis:  Unable to determine  Discharge Planning: To Be Determined  Care plan was discussed with patient, sister, and husband   Thank you for allowing the Palliative Medicine Team to assist in the care of this patient.   Total Time 50 minutes over 2 separate encounters Prolonged Time Billed No   Greater than 50%  of this time was spent counseling and coordinating care related to the above assessment and plan.  Micheline Rough, MD  Please contact Palliative Medicine Team phone at [phone] for questions and concerns.

## 2021-09-29 NOTE — Progress Notes (Signed)
Notified provider of afib 130's.  Scheduled amiodarone administered and prn ativan given.

## 2021-09-29 NOTE — Progress Notes (Addendum)
Progress Note  Patient Name: Patricia Sampson Date of Encounter: 09/29/2021  [facility] Cardiologist: Mertie Moores, MD   Subjective   Cards reconsulted (last saw 10/25 with plan for op event monitor) for recurrent Afib,RVR and hypotension.  Ms Patricia Sampson was in S.T. until about 7 am, when she went into rapid atrial fib, HR sustaining in the 140s. Got IV Lopressor 5 mg, but became hypotensive. Cards asked to see.  Ms Patricia Sampson is A&O, is aware of the atrial fib, but not in pain. She is significantly SOB 2nd lung CA, req HFNC to keep sats > 90%.    Inpatient Medications    Scheduled Meds:  amiodarone  200 mg Oral Daily   apixaban  5 mg Oral BID   chlorhexidine  15 mL Mouth Rinse BID   Chlorhexidine Gluconate Cloth  6 each Topical Daily   feeding supplement  237 mL Oral TID BM   gabapentin  100 mg Oral TID   ipratropium-albuterol  3 mL Nebulization TID   mouth rinse  15 mL Mouth Rinse q12n4p   predniSONE  20 mg Oral Q breakfast   PruTect   Topical BID   sodium chloride flush  10-40 mL Intracatheter Q12H   vitamin B-12  1,000 mcg Oral Daily   Continuous Infusions:  sodium chloride 10 mL/hr at 09/22/21 0559   sodium chloride Stopped (09/18/21 0523)   PRN Meds: sodium chloride, acetaminophen **OR** acetaminophen, ammonium lactate, fentaNYL (SUBLIMAZE) injection, ipratropium-albuterol, LORazepam, magic mouthwash, ondansetron **OR** ondansetron (ZOFRAN) IV, oxyCODONE, sodium chloride flush   Vital Signs    Vitals:   09/29/21 0504 09/29/21 0821 09/29/21 0834 09/29/21 0845  BP: 111/69  (!) 85/48 (!) 70/40  Pulse: 97  82   Resp: 20  20   Temp: 98.3 F (36.8 C)  98.2 F (36.8 C)   TempSrc: Oral  Oral   SpO2: 94% 92% 92% 93%  Weight:      Height:        Intake/Output Summary (Last 24 hours) at 09/29/2021 0911 Last data filed at 09/28/2021 1940 Gross per 24 hour  Intake --  Output 850 ml  Net -850 ml   Last 3 Weights 09/18/2021 09/16/2021 09/16/2021  Weight (lbs) 188  lb 4.4 oz 165 lb 5.5 oz 165 lb 5.5 oz  Weight (kg) 85.4 kg 75 kg 75 kg      Telemetry    ST >> Atrial fib, RVR at 7:03 am - Personally Reviewed  ECG    No new tracing- Personally Reviewed  Physical Exam   GEN: acute Resp distress w/ increased WOB even on HFNC Neck: No JVD Cardiac: Irreg R&R, no murmur, no rubs, or gallops.  Respiratory: diminished to auscultation bilaterally with rales  GI: Soft, nontender, non-distended  MS: No edema; No deformity. Neuro:  Nonfocal  Psych: Normal affect   Labs    High Sensitivity Troponin:  No results for input(s): TROPONINIHS in the last 720 hours.   Chemistry Recent Labs  Lab 09/26/21 0358 09/27/21 0521 09/28/21 0357  NA 137 138 141  K 4.2 4.2 4.7  CL 86* 91* 95*  CO2 43* 44* 42*  GLUCOSE 116* 98 90  BUN _0 CREATININE 1.32* 1.50* 1.27*  CALCIUM 8.0* 8.0* 8.0*  MG 2.2 2.0 2.0  GFRNONAA 45* 39* 48*  ANIONGAP 8 3* 4*    Lipids No results for input(s): CHOL, TRIG, HDL, LABVLDL, LDLCALC, CHOLHDL in the last 168 hours.  Hematology Recent Labs  Lab  09/26/21 0358 09/27/21 0521 09/28/21 0357  WBC 5.2 5.3 6.1  RBC 3.14* 3.01* 3.03*  HGB 8.2* 8.0* 8.0*  HCT 28.3* 27.5* 28.1*  MCV 90.1 91.4 92.7  MCH 26.1 26.6 26.4  MCHC 29.0* 29.1* 28.5*  RDW 18.9* 19.2* 19.1*  PLT 129* 117* 117*   Thyroid No results for input(s): TSH, FREET4 in the last 168 hours.  BNP No results for input(s): BNP, PROBNP in the last 168 hours.   DDimer No results for input(s): DDIMER in the last 168 hours.   Radiology    DG Chest Port 1 View  Result Date: 09/28/2021 CLINICAL DATA:  65 year old female with respiratory failure. EXAM: PORTABLE CHEST - 1 VIEW COMPARISON:  09/22/2021, 09/25/2021 FINDINGS: Slightly worsened mild diffuse obscuration of the mediastinal silhouette. No cardiomegaly. Right upper extremity PICC in place with the tip near the cavoatrial junction. Interval increased bilateral hilar opacities. Similar appearing left upper lobe  diffuse opacity. Increased bibasilar hazy opacities. Similar appearing obscuration of the bilateral costophrenic angles. No acute osseous abnormality. IMPRESSION: Interval increased perihilar and bibasilar opacities as could be seen with worsening atelectasis, pulmonary edema, or infectious/inflammatory etiology superimposed upon left upper lobe neoplasm. Similar appearing trace bilateral pleural effusions. Electronically Signed   By: Ruthann Cancer M.D.   On: 09/28/2021 08:17    Cardiac Studies   2D echo 08/2021   1. Left ventricular ejection fraction, by estimation, is 55 to 60%. The  left ventricle has normal function. The left ventricle has no regional  wall motion abnormalities. Left ventricular diastolic parameters are  consistent with Grade I diastolic dysfunction (impaired relaxation).   2. There is mild respirophasic variation of interventricular septum  position, possibly due to the patient's underlying lung disease. Right  ventricular systolic function is normal. The right ventricular size is  normal.   3. The mitral valve is normal in structure. No evidence of mitral valve  regurgitation. No evidence of mitral stenosis.   4. The aortic valve is tricuspid. Aortic valve regurgitation is not  visualized. No aortic stenosis is present.   5. The inferior vena cava is dilated in size with <50% respiratory  variability, suggesting right atrial pressure of 15 mmHg.   Patient Profile     65 y.o. female with severe COPD with chronic hypoxic respiratory failure on 4-8L, HTN, HLD, remote tobacco abuse, lung mass 04/2021 at OSH later confirmed as lung cancer by pathology 06/2021 undergoing chemoradiation admitted with AMS, concern for sepsis, meningitis, and worsening respiratory failure. Imaging shows progressive bronchogenic carcinoma with increased destruction of anterior left second rib, new perilymphatic nodularity in the left upper lobe adjacent to the mass, consistent with lymphangitic  carcinomatosis, and Unchanged and left suprahilar nodal metastatic disease. Requiring BiPAP for oxygenation; palliative care consultation planned for goals of care. Cardiology consulted for atrial fibrillation, converted on IV amiodarone.  Assessment & Plan    acute on chronic hypoxic hypercapnic respiratory failure - patient sats to 80s on 6 L of oxygen, goes up to 8 L with activity at home - Patient currently req 10 L of O2 as her saturations dropped to 80s on 6 L. - see CXR 11/03, had some pleural effusions, treated w/ IV Lasix 20 mg x 1 - would continue diuresis as tolerates - trying Trilogy vent - per IM, pt being transferred to ICU -agree with palliative consult  2. PAF - Was on IV amio, changed to po on 10/25, once she went back into SR - has been on amio  200 mg qd since 10/25, Eliquis 5 mg bid - got IV Lopressor 5 mg this am at 8:07, SBP dropped 111 >> 70 - IM MD ordered IV digoxin 0.125 mg IV x 1, Cr 1.27, will increase the dose to 0.25 mg x 1, can continue to load per MD - given a bolus of amio IV with improvement in rates, BP still soft, will start amio gtt -digoxin was ordered but would recommend holding off given her AKI (Cr increased from 0.5 to 1.3)   For questions or updates, please contact [place] Please consult [email or web address] for contact info under        Signed, Rosaria Ferries, PA-C  09/29/2021, 9:11 AM     Patient seen and examined.  Agree with above documentation.  On exam, patient is alert and oriented, tachycardic, irregular, diminished breath sounds, no lower extremity edema, + JVD.  Ms. Reith is a 65 year old female with stage IIIa lung cancer, COPD, chronic hypoxic respiratory failure on 6 L O2, hypertension who we are reconsulted to see for A. fib with RVR.  Had initially presented with altered mental status, which has resolved.  She has had worsening respiratory status, up to 10 L [place].  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  Per oncology she is not a candidate for any further chemotherapy and oncologist recommended palliative care/hospice.  She has been seen by palliative and remains full code.  Her course has been complicated by A. fib with RVR.  She was started on amiodarone and converted to sinus rhythm.  Plan was to continue Eliquis and amiodarone 200 mg daily.  Outpatient monitor was planned.  She had been in sinus rhythm until this morning went into A. fib with RVR, heart rates in 140s.  She was given IV Lopressor 5 mg but became hypotensive.  Heart rate improved from 140s to 100-130s since amio bolus.  Will start amio drip.  Agree with palliative medicine consult and continued goals of care discussion.  Donato Heinz, MD

## 2021-09-29 NOTE — Progress Notes (Addendum)
PROGRESS NOTE    GALE KLAR  OOI:[id number] DOB: [birth year] DOA: 09/12/2021 PCP: Greig Right, MD  Brief Narrative: Patricia Sampson is a 65 y.o. female with PMH Stage IIIa LUL adenocarcinoma, chronic hypoxic respiratory failure 6L O2 at home, HTN, HLD who presented with AMS. She underwent extensive workup but ultimately an LP was declined. She was treated empirically with abx and acyclovir for encephalitis and her mentation improved.     Assessment & Plan:   Active Problems:   COPD (chronic obstructive pulmonary disease) (HCC)   Adenocarcinoma of left lung, stage 3 (HCC)   PAF (paroxysmal atrial fibrillation) (HCC)   Acute on chronic respiratory failure with hypoxia and hypercapnia (HCC)   Palliative care by specialist   Goals of care, counseling/discussion   Normocytic anemia   AKI (acute kidney injury) ([place])   Atrial fibrillation with rapid ventricular response ([place])  #1 acute on chronic hypoxic hypercapnic respiratory failure-patient with left upper lobe primary bronchogenic carcinoma with increased destruction of the anterior left second rib.  At baseline she is on 6 L of oxygen at home at rest.  With activity she has to go up to 10 L.  ABG today again with worsening hypercapnia we will place her on BiPAP.  CBC CMP pending.  Chest x-ray pending.   Patient and the family wants to be a full code She has stage III adenocarcinoma of the left upper lobe with invasion of the chest wall and left suprahilar lymphadenopathy diagnosed in August 2022. Patient is not a candidate for any further chemotherapy and oncology has recommended palliative care/hospice  09/19/2021 CT -  Progressive left upper lobe primary bronchogenic carcinoma with increased destruction of the anterior left second rib. New perilymphatic nodularity in the left upper lobe adjacent to the mass, consistent with lymphangitic carcinomatosis. 3. 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  ABG 11/3-7.3  0/85/58 ABG 11/4-reportedly CO2 in the 90s. Patient placed back on BiPAP Trial of trilegy overnight to get her get used to it prior to discharge home.  #2 paroxysmal atrial fibrillation on Eliquis since 09/20/2021 continue amiodarone.  Plan is to have a 30-day outpatient event monitor. Patient went into A. fib RVR today which was treated with amiodarone bolus and drip.  She converted to normal sinus rhythm.  Appreciate cardiology input.  #3 AKI -follow-up labs today pending.  #4 acute metabolic encephalopathy has been resolved, multifactorial etiology  #5 thrombocytopenia progressively worsening.  Unclear reason.  Currently on Eliquis monitor closely.  Addendum-patient went into rapid A. fib with hypotension and was moved to stepdown.  Discussed with PCCM and cardiology.  Patient was started on amiodarone drip with conversion to sinus normal sinus rhythm later on the day.  However she remained hypotensive.  She received a bolus of IV fluids 500 cc normal saline rapid response was called.  We could not give her more fluids due to tenderness fluid situation with already elevated JVD fluid retention and pleural effusion.  She had received a dose of Lasix 09/28/2021 but we were unable to give her any further Lasix due to hypotension.  Stat chest x-ray showed still fluid overload though improved from before. PCCM was consulted due to persistent hypotension and low MAP and was started on  pressors. Discussed with PCCM cardiology and palliative care and family.  Estimated body mass index is 27.8 kg/m as calculated from the following:   Height as of this encounter: _0  (1.753 m).   Weight as of this encounter: 85.4  kg.  DVT prophylaxis: Eliquis  code Status: Full code  family Communication: None  disposition Plan:  Status is: Inpatient  Remains inpatient appropriate because: Hypoxic respiratory failure with hypercapnia    Consultants:  PCCM  Procedures: None  Subjective: Patient was  very anxious early this morning when I saw her and the night nurse reported that she was able to tolerate BiPAP overnight for around 9 hours.  However towards the end of it she became very tachy and anxious and she was given Ativan and beta-blocker.   Later she became hypotensive and was transferred to stepdown unit.  She then converted to normal sinus rhythm on amiodarone drip.  She was seen by cardiology.  Objective: Vitals:   09/29/21 1330 09/29/21 1345 09/29/21 1354 09/29/21 1400  BP: (!) 84/36   (!) 97/52  Pulse:      Resp: 17 18  (!) 24  Temp:      TempSrc:      SpO2:   93%   Weight:      Height:        Intake/Output Summary (Last 24 hours) at 09/29/2021 1451 Last data filed at 09/29/2021 1400 Gross per 24 hour  Intake 119.55 ml  Output 850 ml  Net -730.45 ml    Filed Weights   09/16/21 1200 09/16/21 1600 09/18/21 0500  Weight: 75 kg 75 kg 85.4 kg    Examination:  General exam: Appears calm and comfortable  Respiratory system: few rhonchi to auscultation. Respiratory effort normal. Cardiovascular system: S1 & S2 heard, RRR. No JVD, murmurs, rubs, gallops or clicks. No pedal edema. Gastrointestinal system: Abdomen is nondistended, soft and nontender. No organomegaly or masses felt. Normal bowel sounds heard. Central nervous system: Alert and oriented. No focal neurological deficits. Extremities: Symmetric 5 x 5 power. Skin: No rashes, lesions or ulcers Psychiatry: Judgement and insight appear normal. Mood & affect appropriate.     Data Reviewed: I have personally reviewed following labs and imaging studies  CBC: Recent Labs  Lab 09/24/21 0458 09/24/21 2040 09/25/21 0316 09/26/21 0358 09/27/21 0521 09/28/21 0357 09/29/21 1423  WBC 5.1  --  5.5 5.2 5.3 6.1 7.2  NEUTROABS 4.1  --  4.5 4.2 4.5 5.2  --   HGB 7.1*   < > 8.0* 8.2* 8.0* 8.0* 7.9*  HCT 24.2*   < > 27.0* 28.3* 27.5* 28.1* 27.7*  MCV 88.3  --  88.2 90.1 91.4 92.7 94.2  PLT 139*  --  135* 129* 117*  117* 112*   < > = values in this interval not displayed.    Basic Metabolic Panel: Recent Labs  Lab 09/24/21 0458 09/25/21 0316 09/26/21 0358 09/27/21 0521 09/28/21 0357  NA 137 137 137 138 141  K 3.5 3.8 4.2 4.2 4.7  CL 85* 85* 86* 91* 95*  CO2 >45* >45* 43* 44* 42*  GLUCOSE 85 80 116* 98 90  BUN _0 CREATININE 1.25* 1.42* 1.32* 1.50* 1.27*  CALCIUM 7.7* 7.7* 8.0* 8.0* 8.0*  MG 1.9 1.8 2.2 2.0 2.0    GFR: Estimated Creatinine Clearance: 52.9 mL/min (A) (by C-G formula based on SCr of 1.27 mg/dL (H)). Liver Function Tests: No results for input(s): AST, ALT, ALKPHOS, BILITOT, PROT, ALBUMIN in the last 168 hours. No results for input(s): LIPASE, AMYLASE in the last 168 hours. No results for input(s): AMMONIA in the last 168 hours. Coagulation Profile: No results for input(s): INR, PROTIME in the last 168 hours. Cardiac Enzymes: No  results for input(s): CKTOTAL, CKMB, CKMBINDEX, TROPONINI in the last 168 hours. BNP (last 3 results) No results for input(s): PROBNP in the last 8760 hours. HbA1C: No results for input(s): HGBA1C in the last 72 hours. CBG: No results for input(s): GLUCAP in the last 168 hours. Lipid Profile: No results for input(s): CHOL, HDL, LDLCALC, TRIG, CHOLHDL, LDLDIRECT in the last 72 hours. Thyroid Function Tests: No results for input(s): TSH, T4TOTAL, FREET4, T3FREE, THYROIDAB in the last 72 hours. Anemia Panel: No results for input(s): VITAMINB12, FOLATE, FERRITIN, TIBC, IRON, RETICCTPCT in the last 72 hours. Sepsis Labs: No results for input(s): PROCALCITON, LATICACIDVEN in the last 168 hours.  No results found for this or any previous visit (from the past 240 hour(s)).    Radiology Studies: DG Chest Port 1 View  Result Date: 09/28/2021 CLINICAL DATA:  65 year old female with respiratory failure. EXAM: PORTABLE CHEST - 1 VIEW COMPARISON:  09/22/2021, 09/13/2021 FINDINGS: Slightly worsened mild diffuse obscuration of the mediastinal  silhouette. No cardiomegaly. Right upper extremity PICC in place with the tip near the cavoatrial junction. Interval increased bilateral hilar opacities. Similar appearing left upper lobe diffuse opacity. Increased bibasilar hazy opacities. Similar appearing obscuration of the bilateral costophrenic angles. No acute osseous abnormality. IMPRESSION: Interval increased perihilar and bibasilar opacities as could be seen with worsening atelectasis, pulmonary edema, or infectious/inflammatory etiology superimposed upon left upper lobe neoplasm. Similar appearing trace bilateral pleural effusions. Electronically Signed   By: Ruthann Cancer M.D.   On: 09/28/2021 08:17     Scheduled Meds:  apixaban  5 mg Oral BID   Chlorhexidine Gluconate Cloth  6 each Topical Daily   feeding supplement  237 mL Oral TID BM   gabapentin  100 mg Oral TID   ipratropium-albuterol  3 mL Nebulization TID   mouth rinse  15 mL Mouth Rinse BID   predniSONE  20 mg Oral Q breakfast   PruTect   Topical BID   sodium chloride flush  10-40 mL Intracatheter Q12H   vitamin B-12  1,000 mcg Oral Daily   Continuous Infusions:  sodium chloride 10 mL/hr at 09/22/21 0559   sodium chloride Stopped (09/18/21 0523)   amiodarone 60 mg/hr (09/29/21 1400)   Followed by   amiodarone     sodium chloride       LOS: 18 days    Georgette Shell, MD 09/29/2021, 2:51 PM

## 2021-09-29 NOTE — Progress Notes (Signed)
NAME:  Patricia Sampson, MRN:  [id number], DOB:  06/21/1956, LOS: 46 ADMISSION DATE:  09/25/2021, CONSULTATION DATE: 09/06/2021 REFERRING MD: Dr. Marylyn Ishihara, CHIEF COMPLAINT: Encephalopathy  BRIEF  65 year old woman with very severe COPD, associated chronic hypoxemic respiratory failure (4-8 L/min), hypertension, hyperlipidemia.  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 She was noted to have altered mental status on the morning of 10/17, appeared to be normal when she went to bed the night before.  In the emergency department she was tachycardic, tachypneic with pancytopenia.  Last chemotherapy infusion appears to have been 09/05/2021.  Acute metabolic encephalopathy.  She was treated empirically for possible sepsis, source unknown. Home med list with Percocet, Neurontin.  Her husband notes that he does not believe she is been taking any of her medicines for the last 2 to 3 days.   Pertinent  Medical History   Past Medical History:  Diagnosis Date   Angioedema    COPD (chronic obstructive pulmonary disease) (HCC)    FHx: migraine headaches    Hyperlipidemia    Hypertension    Seasonal allergies     Significant Hospital Events: Including procedures, antibiotic start and stop dates in addition to other pertinent events   CT abdomen pelvis 10/17 >> no acute findings Head CT 10/17 >> normal CT-PA chest 10/17 >> no new infiltrates or significant change in the left upper lobe mass.  No PE MRI brain 10/17 >> Motion degradation compromise interpretation, no acute or subacute infarct, no mass lesion  Progressive left upper lobe primary bronchogenic carcinoma with increased destruction of the anterior left second rib. New perilymphatic nodularity in the left upper lobe adjacent to the mass, consistent with lymphangitic carcinomatosis. 3. 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  EEG 10/18 with  generalized slowing, no evidence of active seizures or epileptiform activity 10/18 acyclovir and ampicillin added to cover possible meningitis 10/19 SVT, likely A. fib/flutter noted after adenosine given.  Hypotension with diltiazem, then started amiodarone.  Back in sinus tachycardia 10/20 - SVT with what looks like A. fib/flutter after adenosine given early evening 10/19.  No real response to diltiazem infusion, then given amiodarone.  Back in sinus tachycardia, . \atient continued to be aphasic and to have agitation through the day 10/19.  Now off Precedex. I/O positive 8.7 L total. Has had hypokalemia, replaced. WBC increasing, 2.9 this morning 10/20  10/21 -husband and son at the bedside.  They tell me that after she had COVID in 2020 she has been on 6 L oxygen.  For the last 3 months before admissions she has had 35 pound weight loss approximately.  August 2022 was diagnosed with cancers in September 2022 was on chemoradiation.  Since chemoradiation there is more failure to thrive with progressive decrease in activity particularly after chemoradiation cycles. Currently on 10 L nasal cannula pulse ox 95%.  Has not been on BiPAP.  Has not been on the ventilator.  Is not on pressors.  Not on Precedex  10/22 -had severe worsening of respiratory acidosis acute on chronic with a pH of 7.22 and a PCO2 of 90 associated with worsening bilateral lower lobe atelectasis [has left upper lobe cancer] on chest x-ray compared to 09/22/2021.  Treated with BiPAP.  Blood gas subsequent improvement but did not tolerate BiPAP.  Nonetheless mental status today better.  Husband also acknowledges that she is looking better.  Cachectic elderly female.  Significant physical deconditioning .  currently on 6 L nasal cannula pulse ox 95%. Continues on IV heparin infusion and amio infusion.  On acyclovir and ampicillin and ceftriaxone for potential meningitis.  Afebrile since 09/13/2021 but leukopenic with a white count of 2.4K.   Culture negative so far Pall care consult - full code + but family meeting being arranged  09/22/21 - Today patient states she is nauseated. RN had to increase her oxygen from 6 L to 8 L . She has some skin burning/ pain from radiation on her left chest and back. Dr. Earlie Server spoke with patient 10/27 about the fact he did not feel she was a good candidate for further chemotherapy, or targeted therapy, because of her poor performance status and worsening respiratory failure. Palliation was consulted, but patient remains a Full Code.  Family are at bedside  11/2 doing a little better w/ night time BIPAP ~ 4 hrs. Oxygen requirements had been down to 6, now closer to 10  after exertion. IVFs added back on 11/1  11/4 PCCM re-consulted, pt transferred back to stepdown today after converted to Afib and was hypotensive, started on amiodarone gtt and converted back to sinus.  ABG showed respiratory acidosis, back on Bipap      Interim History / Subjective:   Afib with RVR and hypotension, transfer back to step down  Objective   Blood pressure (!) 93/47, pulse 81, temperature 98.2 F (36.8 C), temperature source Oral, resp. rate 15, height _0  (1.753 m), weight 85.4 kg, SpO2 96 %.      Estimated body mass index is 27.8 kg/m as calculated from the following:   Height as of this encounter: _1  (1.753 m).   Weight as of this encounter: 85.4 kg.   Intake/Output Summary (Last 24 hours) at 09/29/2021 1643 Last data filed at 09/29/2021 1400 Gross per 24 hour  Intake 119.55 ml  Output 850 ml  Net -730.45 ml    Filed Weights   09/16/21 1200 09/16/21 1600 09/18/21 0500  Weight: 75 kg 75 kg 85.4 kg     General:  thin, elderly F awake and sitting up, tolerating Bipap  HEENT: MM pink/moist Neuro: awake and alert, answering questions CV: s1s2 rrr, no m/r/g PULM:  Tolerating Bipap 10/5 80% GI: soft, bsx4 active  Extremities: warm/dry, no edema  Skin: no rashes or lesions       LABS     PULMONARY Recent Labs  Lab 09/25/21 1256 09/28/21 1155 09/29/21 1435  PHART 7.389 7.304* 7.280*  PCO2ART 76.9* 85.3* 92.0*  PO2ART 58.4* 58.1* 64.2*  HCO3 45.4* 41.1* 41.8*  O2SAT 90.1 89.1 90.6     CBC Recent Labs  Lab 09/27/21 0521 09/28/21 0357 09/29/21 1423  HGB 8.0* 8.0* 7.9*  HCT 27.5* 28.1* 27.7*  WBC 5.3 6.1 7.2  PLT 117* 117* 112*     COAGULATION No results for input(s): INR in the last 168 hours.  CARDIAC  No results for input(s): TROPONINI in the last 168 hours. No results for input(s): PROBNP in the last 168 hours.   CHEMISTRY Recent Labs  Lab 09/24/21 0458 09/25/21 0316 09/26/21 0358 09/27/21 0521 09/28/21 0357 09/29/21 1423  NA 137 137 137 138 141 137  K 3.5 3.8 4.2 4.2 4.7 4.8  CL 85* 85* 86* 91* 95* 90*  CO2 >45* >45* 43* 44* 42* 42*  GLUCOSE 85 80 116* 98 90 252*  BUN _2 24*  CREATININE 1.25* 1.42* 1.32* 1.50* 1.27* 1.28*  CALCIUM 7.7* 7.7*  8.0* 8.0* 8.0* 8.0*  MG 1.9 1.8 2.2 2.0 2.0  --     Estimated Creatinine Clearance: 52.5 mL/min (A) (by C-G formula based on SCr of 1.28 mg/dL (H)).   LIVER No results for input(s): AST, ALT, ALKPHOS, BILITOT, PROT, ALBUMIN, INR in the last 168 hours.   INFECTIOUS No results for input(s): LATICACIDVEN, PROCALCITON in the last 168 hours.   ENDOCRINE CBG (last 3)  No results for input(s): GLUCAP in the last 72 hours.       IMAGING x48h  - image(s) personally visualized  -   highlighted in bold DG Chest 1 View  Result Date: 09/29/2021 CLINICAL DATA:  Shortness of breath. Left upper lobe adenocarcinoma. COPD. EXAM: CHEST  1 VIEW COMPARISON:  Multiple exams, including chest radiograph 09/28/2021 FINDINGS: Left upper lobe mass with known anterior rib destruction. Roughly stable right basilar airspace opacities with obscuration of portions of the right hemidiaphragm and of the right heart border indicating likely right lower lobe and right middle lobe involvement. Stable  retrocardiac airspace opacity with obscuration left hemidiaphragm indicating left lower lobe involvement. Minimally reduced hilar prominence. Kerley B lines are still present for example peripherally at the right lung base, but are less striking than on the prior exam. Right-sided PICC line tip: SVC. Degenerative glenohumeral arthropathy bilaterally, left greater than right. IMPRESSION: 1. Minimally reduced Kerley B lines compared to the prior exam suggesting slight improvement in the interstitial edema. 2. Stable bibasilar airspace opacities suspicious for multilobar pneumonia. 3. Stable left upper lobe mass with associated rib destruction. Electronically Signed   By: Van Clines M.D.   On: 09/29/2021 15:14   DG Chest Port 1 View  Result Date: 09/28/2021 CLINICAL DATA:  65 year old female with respiratory failure. EXAM: PORTABLE CHEST - 1 VIEW COMPARISON:  09/22/2021, 09/07/2021 FINDINGS: Slightly worsened mild diffuse obscuration of the mediastinal silhouette. No cardiomegaly. Right upper extremity PICC in place with the tip near the cavoatrial junction. Interval increased bilateral hilar opacities. Similar appearing left upper lobe diffuse opacity. Increased bibasilar hazy opacities. Similar appearing obscuration of the bilateral costophrenic angles. No acute osseous abnormality. IMPRESSION: Interval increased perihilar and bibasilar opacities as could be seen with worsening atelectasis, pulmonary edema, or infectious/inflammatory etiology superimposed upon left upper lobe neoplasm. Similar appearing trace bilateral pleural effusions. Electronically Signed   By: Ruthann Cancer M.D.   On: 09/28/2021 08:17      Resolved Hospital Problem list     Assessment & Plan:   1) Acute on chronic respiratory failure with hypoxia and hypercapnia 2) Acute COPD exacerbation  3) progressive lung adenocarcinoma: Stage IIIA (T4, N1, M0)  adenocarcinoma - presented with large left upper lobe lung mass with chest  wall invasion in addition to left suprahilar lymphadenopathy diagnosed in August 2022.  Despite treatment she has had progression with rib destruction. No further chemo or biologic Rx  per Dr Julien Nordmann dsicussion with ccm  4) GOLD IV/class D COPD: Her baseline lung function is severely impaired (FEV1 24% predicted in 2021) Acute Encephalopathy w/ agitated Delirium (resolved) 5) SVT & PAF: currently NSR 6) Chronic anemia, stable 7) Acute thrombocytopenia 8) Acute leukopenia 9) Hyponatremia, likely hypervolemic- resolved 10) Hypokalemia, resolved 11) Mild hypomagnesmia 12 ) AKI 13) adult Failure to thrive 14) severe protein calorie malnutrition 15) cancer related pain and chronic opioid use w/ acute on chronic pain  16) physical deconditioning - severe   Pulmonary Problem list  Acute on chronic hypoxic and hypercarbic respiratory failure. Multifactorial. Initially d/t AECOPD.  Now really more chronic: advanced COPD, advanced adenocarcinoma w/ fairly significant structural damage to the left lung, and suspect some degree of secondary PH (although looks like RV size nml on recent ECHO.) Little to be done in regards to improvement.   11/2: APP  used a car engine to explain her current condition. At one time her body was running on a 6 cylinder engine and now she is functioning on 2. I am doubtful we can get those 4 cylinders back so we need to focus on how we maximize the last 2. She seemed to understand this. We did briefly discuss trach/vent. I do not think she is a good candidate for trach and she indicates to me that she would not want long term vent or trach.   11/3 - 7L o2 need. No better. Dedonditione. ECOG4. In bed. Will benefit from trilogy QHS at home. See justficiation if they decline home hospice -  Patient's PEARLINE YERBY with Jan 15, 1956  has chronic hypercapnic respiratory failure due to copd, post copvid is life threatening.  Previous ABG's have documented high PCO2 and spirometry  reveals severe obstructive ventilatory defect.  Patient would benefit from non-invasive ventilation.  Without this therapy, the patient is at high risk of ending up with worsening symptoms, worsened respiratory failure, need for ER visits and/or recurrent hospitalizations.  Bilevel device unable to adequately support patient's nocturnal ventilation needs.  Patient would benefit from NIV therapy with set tidal volumes and pressure.    11/4 Today converted to atrial fibrillation with RVR and hypotension along with worsening hypercarbic respiratory failure.  She is followed by Palliative affirms to me, along with her sister, that she wants to keep going with aggressive care to the point of a tracheostomy.  She was to get her last radiation treatment today, no longer a candidate for chemo.    Plan -now sinus rhythm and normotensive on amiodarone gtt, cardiology following -requiring Bipap at night and prn during the day, tolerating well, though concerned that she will progress to requiring bipap around the clock and eventually to intubation -family has been given the option of palliative/hospice, want full code aggressive care, she is hopeful to go home on trilegy  -no acute critical care needs currently, repeat ABG in the AM -continue supportive care and family conversations regarding goals, pt continues to clearly state no trach, but would want intubation/chest compressions -if discharged should be on triple inhaled rx LABA/ICS/anticholinergic  -continue weaning prednisone -family wanting consult with [facility] for second opinion re: chemo options, Oncology following -PCCM will follow along intermittently and be available as needed     Best Practice (right click and "Reselect all SmartList Selections" daily)   Diet/type: Regular consistency (see orders) DVT prophylaxis: other GI prophylaxis: N/A Lines: N/A Foley:  N/A Code Status:  full code Last date of multidisciplinary goals of care  discussion [10/27-- son, niece, patient. She remains full code at this time, updated son 09/25/21  - son updated over phone by CCM.]    Virgie Dad R Tannia Contino, PA-C [facility] See Amion for pager If no response to pager , please call [phone] until 7pm After 7:00 pm call Elink  [phone]?[place]

## 2021-09-29 NOTE — Progress Notes (Signed)
OT Cancellation Note  Patient Details Name: Patricia Sampson MRN: [id number] DOB: [birth year]   Cancelled Treatment:    Reason Eval/Treat Not Completed: Medical issues which prohibited therapy;Patient not medically ready: Noted pt was moved to ICU this morning with current BP: 75/52 and HR: 134 at rest. Will hold OT and monitor as able for medical clearance to resume therapy.   Julien Girt 09/29/2021, 10:30 AM

## 2021-09-29 NOTE — Progress Notes (Signed)
Pt's BP 70/40 manual, HR 130s-150s Afib w/ RVR, SpO2 85 on 8 L. Pt c/o feeling "sleepy."  MD notified at [place].  RR notified, ordered 500 cc bolus at 500 mL/hr.  Increased O2 to 10 L/hr, SpO2 rose to 95.  Pt HR and BP refractory to Tx at 0900.  MD and RR RN at bedside.  Pt transferred to ICU.  This RN unable to perform physical assessment prior to transfer due to above, with symptoms preceding these events beginning at shift change.  Angie Fava, RN

## 2021-09-29 NOTE — Progress Notes (Addendum)
Rapid Response RN rounding on 4th floor- noted 4th floor charge RN in room taking manual BP. Heart rate 130s-150s. EKG done showing A-fib RVR. Per bedside RN, manual BP in 70s, correlating with monitoring BP. Patient did receive IV metoprolol, IV ativan and PO Amiodarone prior to arrival. Patient alert and following commands on rapid response assessment.  500 cc fluid bolus ordered per Rapid Response standing orders. Zigmund Daniel MD notified by bedside RN. Orders received to transfer patient to SDU for BP and oxygen monitoring

## 2021-09-29 NOTE — Progress Notes (Signed)
Patient transferred to room 1239 per MD orders. Patient awake/alert. Patient stated that we could call either her son or husband. Attempted to call spouse Abe People and phone went to voicemail. RN able to reach son Jens Som and provided update.

## 2021-09-29 NOTE — Progress Notes (Signed)
Pt refused in and out cath.  RN educated pt on importance of emptying bladder.  Pt would like to wait 30 minutes to attempt voiding.  RN will continue to carefully monitor.

## 2021-09-30 ENCOUNTER — Ambulatory Visit: Payer: 59

## 2021-09-30 DIAGNOSIS — R6510 Systemic inflammatory response syndrome (SIRS) of non-infectious origin without acute organ dysfunction: Secondary | ICD-10-CM | POA: Diagnosis not present

## 2021-09-30 DIAGNOSIS — I4891 Unspecified atrial fibrillation: Secondary | ICD-10-CM | POA: Diagnosis not present

## 2021-09-30 DIAGNOSIS — Z7189 Other specified counseling: Secondary | ICD-10-CM | POA: Diagnosis not present

## 2021-09-30 DIAGNOSIS — C3492 Malignant neoplasm of unspecified part of left bronchus or lung: Secondary | ICD-10-CM | POA: Diagnosis not present

## 2021-09-30 DIAGNOSIS — J9621 Acute and chronic respiratory failure with hypoxia: Secondary | ICD-10-CM | POA: Diagnosis not present

## 2021-09-30 LAB — COMPREHENSIVE METABOLIC PANEL
ALT: 13 U/L (ref 0–44)
AST: 12 U/L — ABNORMAL LOW (ref 15–41)
Albumin: 2.3 g/dL — ABNORMAL LOW (ref 3.5–5.0)
Alkaline Phosphatase: 50 U/L (ref 38–126)
Anion gap: 6 (ref 5–15)
BUN: 28 mg/dL — ABNORMAL HIGH (ref 8–23)
CO2: 41 mmol/L — ABNORMAL HIGH (ref 22–32)
Calcium: 8.4 mg/dL — ABNORMAL LOW (ref 8.9–10.3)
Chloride: 91 mmol/L — ABNORMAL LOW (ref 98–111)
Creatinine, Ser: 1.28 mg/dL — ABNORMAL HIGH (ref 0.44–1.00)
GFR, Estimated: 47 mL/min — ABNORMAL LOW (ref >60)
Glucose, Bld: 84 mg/dL (ref 70–99)
Potassium: 4.5 mmol/L (ref 3.5–5.1)
Sodium: 138 mmol/L (ref 135–145)
Total Bilirubin: 0.6 mg/dL (ref 0.3–1.2)
Total Protein: 6 g/dL — ABNORMAL LOW (ref 6.5–8.1)

## 2021-09-30 LAB — BLOOD GAS, ARTERIAL
Acid-Base Excess: 14.9 mmol/L — ABNORMAL HIGH (ref 0.0–2.0)
Acid-Base Excess: 14.9 mmol/L — ABNORMAL HIGH (ref 0.0–2.0)
Bicarbonate: 43.1 mmol/L — ABNORMAL HIGH (ref 20.0–28.0)
Bicarbonate: 42.4 mmol/L — ABNORMAL HIGH (ref 20.0–28.0)
FIO2: 70
O2 Saturation: 98.3 %
O2 Saturation: 99.3 %
Patient temperature: 98.6
Patient temperature: 98.6
pCO2 arterial: 90.1 mmHg (ref 32.0–48.0)
pCO2 arterial: 82.8 mmHg (ref 32.0–48.0)
pH, Arterial: 7.301 — ABNORMAL LOW (ref 7.350–7.450)
pH, Arterial: 7.33 — ABNORMAL LOW (ref 7.350–7.450)
pO2, Arterial: 99.8 mmHg (ref 83.0–108.0)
pO2, Arterial: 134 mmHg — ABNORMAL HIGH (ref 83.0–108.0)

## 2021-09-30 LAB — CBC
HCT: 26.5 % — ABNORMAL LOW (ref 36.0–46.0)
Hemoglobin: 7.6 g/dL — ABNORMAL LOW (ref 12.0–15.0)
MCH: 26.8 pg (ref 26.0–34.0)
MCHC: 28.7 g/dL — ABNORMAL LOW (ref 30.0–36.0)
MCV: 93.3 fL (ref 80.0–100.0)
Platelets: 120 10*3/uL — ABNORMAL LOW (ref 150–400)
RBC: 2.84 MIL/uL — ABNORMAL LOW (ref 3.87–5.11)
RDW: 19.8 % — ABNORMAL HIGH (ref 11.5–15.5)
WBC: 6 10*3/uL (ref 4.0–10.5)
nRBC: 0 % (ref 0.0–0.2)

## 2021-09-30 LAB — MAGNESIUM: Magnesium: 1.8 mg/dL (ref 1.7–2.4)

## 2021-09-30 MED ORDER — MORPHINE SULFATE (PF) 2 MG/ML IV SOLN
2.0000 mg | INTRAVENOUS | Status: DC | PRN
  Administered 2021-10-01 – 2021-10-03 (×5): 2 mg via INTRAVENOUS
  Filled 2021-09-30 (×11): qty 1

## 2021-09-30 MED ORDER — LIP MEDEX EX OINT
TOPICAL_OINTMENT | CUTANEOUS | Status: DC | PRN
  Administered 2021-09-30 – 2021-10-09 (×3): 1 via TOPICAL
  Filled 2021-09-30: qty 7

## 2021-09-30 MED ORDER — DIGOXIN 0.25 MG/ML IJ SOLN
0.1250 mg | Freq: Every day | INTRAMUSCULAR | Status: DC
  Administered 2021-09-30: 0.125 mg via INTRAVENOUS
  Filled 2021-09-30: qty 2

## 2021-09-30 MED ORDER — HALOPERIDOL LACTATE 5 MG/ML IJ SOLN: 1.0000 mg | INTRAMUSCULAR | Status: DC | PRN

## 2021-09-30 MED ORDER — GLYCOPYRROLATE 0.2 MG/ML IJ SOLN
0.2000 mg | INTRAMUSCULAR | Status: DC | PRN
Start: 2021-09-30 — End: 2021-10-11
  Filled 2021-09-30 (×2): qty 1

## 2021-09-30 MED ORDER — LORAZEPAM 2 MG/ML IJ SOLN
0.5000 mg | INTRAMUSCULAR | Status: DC | PRN
  Administered 2021-09-30 – 2021-10-01 (×2): 0.5 mg via INTRAVENOUS
  Administered 2021-10-02 – 2021-10-07 (×5): 1 mg via INTRAVENOUS
  Filled 2021-09-30 (×7): qty 1

## 2021-09-30 MED ORDER — MAGNESIUM SULFATE 2 GM/50ML IV SOLN
2.0000 g | Freq: Once | INTRAVENOUS | Status: AC
  Administered 2021-09-30: 2 g via INTRAVENOUS
  Filled 2021-09-30: qty 50

## 2021-09-30 NOTE — Progress Notes (Signed)
Daily Progress Note   Patient Name: Patricia Sampson       Date: 09/30/2021 DOB: 02-Nov-1956  Age: 65 y.o. MRN#: [id number] Attending Physician: Georgette Shell, MD Primary Care Physician: Greig Right, MD Admit Date: 09/01/2021  Reason for Consultation/Follow-up: Establishing goals of care  Subjective: I met with Patricia Sampson and Patricia Sampson multiple times today including longest encounter in conjunction with Dr. Verlee Monte followed by a second encounter with Franco Collet from spiritual care.  These discussions included multiple Sampson members including Patricia Sampson, Sampson, Sampson, sister, sister-in-law, and a cousin.  Dr. Verlee Monte had discussed with patient and Patricia Sampson regarding the severity of Patricia situation and the fact that she is approaching end-of-life.  We then met with the rest of Patricia Sampson to continue this conversation.  Discussed that she is suffering from worsening respiratory failure and has not been able to be off of the BiPAP to the point where she has been able to eat or even take pills for over 24 hours.  Discussed concern that even in the best of circumstances she is not likely to ever reach a point of getting more treatment for Patricia lung cancer.  Sampson has been helpful for a second opinion but has not been stable enough to discharge from the hospital in order to do this.  We discussed concern that she is approaching end-of-life regardless of interventions moving forward.  Patricia Sampson was understandably upset by this.  Patricia Sampson had many questions about trying to get Patricia to another facility or other interventions that could be done to reverse what is going on.  Discussed concern that this is likely irreversible illness and also concern that the next step if she continues to  decline (she is showing signs that she is not keeping up with ventilatory needs even with the assistance of BiPAP) would be for intubation.  We also discussed specifically the concern that if she is intubated she would not ever reach a point of being extubated again and I would need to come a decision about a tracheostomy (which she is adamantly declined several times) versus extubating understanding that she would die shortly afterward.  This is a very emotional conversation with Patricia Sampson and we stepped out to give him time to discuss and consider.  I followed up again with  Sampson in conjunction with Katie from spiritual care.  We talked with Patricia Sampson it were joined by Patricia cousin and Sampson.  We discussed the unknown of Patricia situation and the fact that we are very concerned about Patricia respiratory status moving forward.  We talked through options for Patricia care and at the end of conversation, the only limit with which she consistently outlined was that she would not want a tracheostomy.  She then expressed that she needs to see Patricia grandchildren and became tremendously upset with the fact that she has not seen Patricia 95-year-old granddaughter.  She was focused only on being able to spend time with Patricia Sampson and stated she was going to "sign out to discharge myself home" if she cannot see them.  Length of Stay: 19  Current Medications: Scheduled Meds:  . apixaban  5 mg Oral BID  . Chlorhexidine Gluconate Cloth  6 each Topical Daily  . digoxin  0.125 mg Intravenous Daily  . feeding supplement  237 mL Oral TID BM  . gabapentin  100 mg Oral TID  . ipratropium-albuterol  3 mL Nebulization TID  . mouth rinse  15 mL Mouth Rinse BID  . predniSONE  20 mg Oral Q breakfast  . PruTect   Topical BID  . sodium chloride flush  10-40 mL Intracatheter Q12H  . vitamin B-12  1,000 mcg Oral Daily    Continuous Infusions: . sodium chloride 10 mL/hr at 09/22/21 0559  . sodium chloride Stopped (09/18/21 0523)  .  amiodarone 30 mg/hr (09/30/21 1532)  . sodium chloride      PRN Meds: sodium chloride, acetaminophen **OR** acetaminophen, ammonium lactate, fentaNYL (SUBLIMAZE) injection, ipratropium-albuterol, LORazepam, magic mouthwash, ondansetron **OR** ondansetron (ZOFRAN) IV, oxyCODONE, sodium chloride, sodium chloride flush  Physical Exam         General: awake alert oriented, converses appropriately.  On BiPAP throughout both encounters  HEENT: now on O2 [place].  heart: Regular rate and rhythm. No murmur appreciated. Lungs: Diminished air movement, clear Abdomen: Soft, nontender, nondistended, positive bowel sounds.   Ext: No significant edema Skin: Warm and dry  Vital Signs: BP 114/65   Pulse 91   Temp 99.9 F (37.7 C) (Axillary)   Resp 16   Ht _0  (1.753 m)   Wt 85.4 kg   SpO2 94%   BMI 27.80 kg/m  SpO2: SpO2: 94 % O2 Device: O2 Device: Bi-PAP O2 Flow Rate: O2 Flow Rate (L/min): 10 L/min  Intake/output summary:  Intake/Output Summary (Last 24 hours) at 09/30/2021 1651 Last data filed at 09/30/2021 1318 Gross per 24 hour  Intake 698.42 ml  Output 575 ml  Net 123.42 ml    LBM: Last BM Date: 09/27/21 Baseline Weight: Weight: 75 kg Most recent weight: Weight: 85.4 kg       Palliative Assessment/Data:    Flowsheet Rows    Flowsheet Row Most Recent Value  Intake Tab   Referral Department Hospitalist  Unit at Time of Referral ICU  Palliative Care Primary Diagnosis Sepsis/Infectious Disease  Date Notified 09/15/21  Palliative Care Type New Palliative care  Reason for referral Clarify Goals of Care  Date of Admission 09/03/2021  Date first seen by Palliative Care 09/16/21  # of days Palliative referral response time 1 Day(s)  # of days IP prior to Palliative referral 4  Clinical Assessment   Palliative Performance Scale Score 40%  Psychosocial & Spiritual Assessment   Palliative Care Outcomes        Patient Active  Problem List   Diagnosis Date Noted  . Atrial  fibrillation with rapid ventricular response ([place])   . Normocytic anemia 09/23/2021  . AKI (acute kidney injury) ([place]) 09/23/2021  . Palliative care by specialist   . Goals of care, counseling/discussion   . Acute on chronic respiratory failure with hypoxia and hypercapnia (HCC)   . PAF (paroxysmal atrial fibrillation) ([place])   . Microcytic anemia 08/21/2021  . Adenocarcinoma of left lung, stage 3 ([place]) 07/19/2021  . Encounter for antineoplastic chemotherapy 07/19/2021  . Cancer associated pain 07/19/2021  . Pneumonia due to COVID-19 virus 11/25/2019  . Shortness of breath 11/18/2019  . Chronic respiratory failure with hypoxia ([place]) 11/18/2019  . COPD (chronic obstructive pulmonary disease) ([place]) 01/27/2014  . Hypoxemia 01/27/2014  . Secondary pulmonary arterial hypertension ([place]) 01/27/2014    Palliative Care Assessment & Plan   Patient Profile: 65 y.o. female  with past medical history of severe COPD, chronic hypoxic respiratory failure on 4-8 L, hypertension, hyperlipidemia with recent diagnosis of stage III adenocarcinoma with large left upper lobe mass and chest wall invasion in August of this year who is currently undergoing chemoradiation admitted on 09/14/2021 with altered mental status with concern for sepsis.  Patricia mental status over the past several days has remained poor and far from Patricia baseline.  Palliative consulted for goals of care.  Recommendations/Plan: Full code/full scope Myself and Dr. Verlee Monte discussed with Patricia Sampson and Patricia Sampson regarding concerns about Patricia continued respiratory failure.   Sampson requests someone reaching out to [facility] to see if they have other interventions to offer.  We discussed that she is currently not stable enough to transfer and there not likely other interventions they have to offer. We reviewed again limits of care and she is very very consistent and indicating she would not want a tracheostomy.  I attempted to clarify further about  intubation in light of the fact that this would likely lead to tracheostomy as she would very likely be unable to be weaned.  She became emotionally distraught and I asked Patricia to consider this again further in conjunction with Patricia Sampson as we will discuss again tomorrow.  She understands that if Patricia condition declines in the interim, plan would be for intubation.  We also discussed, however, that she would not want to pursue tracheostomy in the event that she cannot be medically liberated from the ventilator. While she does not full comfort care at this time, it is likely she is going to end up intubated and not be able to be liberated from the ventilator in a meaningful way moving forward.  I therefore discussed with staff that I think allowing compassionate visitation of Patricia grandchildren for short period of time would be reasonable as this very well could be the last time she is able to see them while she is awake and able to interact. Patricia Sampson is incredibly supportive but continues to struggle emotionally with the fact that she is likely not going to survive this hospitalization.     Goals of Care and Additional Recommendations: Limitations on Scope of Treatment: Full Scope Treatment  Code Status:    Code Status Orders  (From admission, onward)           Start     Ordered   08/26/2021 2128  Full code  Continuous        09/14/2021 2127           Code Status History     This  patient has a current code status but no historical code status.       Prognosis: Guarded  Discharge Planning: To Be Determined  Care plan was discussed with patient, Sampson, Sampson, Sampson, cousin, sister-in-law, and other Sampson  Thank you for allowing the Palliative Medicine Team to assist in the care of this patient.   Total Time 60 minutes over 2 separate encounters Prolonged Time Billed No   Greater than 50%  of this time was spent counseling and coordinating care related to the above  assessment and plan.  Micheline Rough, MD  Please contact Palliative Medicine Team phone at [phone] for questions and concerns.

## 2021-09-30 NOTE — Progress Notes (Signed)
Chaplain supported family and sat with patient and family while Dr Domingo Cocking discussed goals of care with [facility].  Daleena wanted to focus on seeing her grandchildren and we were able to get an exception for the children to come to the room for a brief visit, given the circumstances.  Chaplain provided prayer with family and prayer with [place]. Loise asked that the medical team "keep trying" when I asked her if there was anything she wanted them to know.  We did not discuss any more about particular interventions at that time as we had already discussed those with Dr. Domingo Cocking.  [place], Marty Pager, [phone] 4:27 PM

## 2021-09-30 NOTE — Progress Notes (Signed)
[facility] ADULT ICU REPLACEMENT PROTOCOL   The patient does apply for the [facility] Adult ICU Electrolyte Replacment Protocol based on the criteria listed below:   1.Exclusion criteria: TCTS patients, ECMO patients, and Dialysis patients 2. Is GFR >/= 30 ml/min? Yes.    Patient's GFR today is 47 3. Is SCr </= 2? Yes.   Patient's SCr is 1.28 mg/dL 4. Did SCr increase >/= 0.5 in 24 hours? No. 5.Pt's weight >40kg  Yes.   6. Abnormal electrolyte(s): mag 1.8  7. Electrolytes replaced per protocol 8.  Call MD STAT for K+ </= 2.5, Phos </= 1, or Mag </= 1 Physician:  n/a  Darlys Gales 09/30/2021 6:03 AM

## 2021-09-30 NOTE — Progress Notes (Signed)
PROGRESS NOTE    Patricia Sampson  TLX:[id number] DOB: Dec 19, 1955 DOA: 09/23/2021 PCP: Greig Right, MD  Brief Narrative: Patricia Sampson is a 65 y.o. female with PMH Stage IIIa LUL adenocarcinoma, chronic hypoxic respiratory failure 6L O2 at home, HTN, HLD who presented with AMS. She underwent extensive workup but ultimately an LP was declined. She was treated empirically with abx and acyclovir and BiPAP. Her mentation improved with BiPAP around-the-clock. ABG showed acute respiratory acidosis with CO2 above 80 and severe hypoxia  Assessment & Plan:   Active Problems:   COPD (chronic obstructive pulmonary disease) (HCC)   Adenocarcinoma of left lung, stage 3 (HCC)   PAF (paroxysmal atrial fibrillation) (HCC)   Acute on chronic respiratory failure with hypoxia and hypercapnia (HCC)   Palliative care by specialist   Goals of care, counseling/discussion   Normocytic anemia   AKI (acute kidney injury) ([place])   Atrial fibrillation with rapid ventricular response ([place])  #1 acute on chronic hypoxic hypercapnic respiratory failure-patient with left upper lobe primary bronchogenic carcinoma with increased destruction of the anterior left second rib.  At baseline she is on 6 L of oxygen at home at rest.  With activity she has to go up to 10 L.   ABG with persistent hypercapnia was placed back on BiPAP overnight she remained on BiPAP all night.  She was unable to tolerate off BiPAP to eat or take pills as she desaturated rapidly. Appreciate PCCM input. Patient and the family wants to be a full code She has stage III adenocarcinoma of the left upper lobe with invasion of the chest wall and left suprahilar lymphadenopathy diagnosed in August 2022. Patient is not a candidate for any further chemotherapy and oncology has recommended palliative care/hospice  09/19/2021 CT -  Progressive left upper lobe primary bronchogenic carcinoma with increased destruction of the anterior left second rib. New  perilymphatic nodularity in the left upper lobe adjacent to the mass, consistent with lymphangitic carcinomatosis. 3. 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  ABG 11/3-7.3 0/85/58 ABG 11/4-reportedly CO2 in the 90s. Patient placed back on BiPAP ABG 09/30/2021 7.3 3/82/134 this was done after being on BiPAP all night. Chest x-ray 09/29/2021 minimally reduced curly B-lines compared to prior exam suggesting slight improvement in interstitial edema.  Stable bibasilar airspace opacities suspicious for multilobar pneumonia.  Stable left upper lobe mass with associated rib destruction.   #2 A. fib RVR patient with history of paroxysmal A. fib.  She was started on amiodarone drip 09/29/2021.  She was transferred to stepdown unit on 09/29/2021 for the same.  She is now in normal sinus rhythm.    #3 AKI -creatinine 1.28 which may be her new baseline.  #4 acute metabolic encephalopathy has been resolved, multifactorial etiology, hypoxia, hypercapnia  #5 thrombocytopenia platelets around 120 stable.   Estimated body mass index is 27.8 kg/m as calculated from the following:   Height as of this encounter: _0  (1.753 m).   Weight as of this encounter: 85.4 kg.  DVT prophylaxis: Eliquis  code Status: Full code  family Communication: None  disposition Plan:  Status is: Inpatient  Remains inpatient appropriate because: Hypoxic respiratory failure with hypercapnia    Consultants:  PCCM  Procedures: None  Subjective:  Husband by the bedside Overnight patient remained on BiPAP She appears in no acute distress on the BiPAP and wanting it removed she complains of a headache being on BiPAP all night However they have denied tracheostomy and wants to be  full code and wants to get a second opinion for her lung cancer and wants to get further chemo Palliative care and PCCM following Objective: Vitals:   09/30/21 0840 09/30/21 0900 09/30/21 1100 09/30/21 1146  BP:  119/61 114/65    Pulse: 92  93 91  Resp: 17 (!) _0 Temp:    99.5 F (37.5 C)  TempSrc:    Axillary  SpO2: 98%  96% 95%  Weight:      Height:        Intake/Output Summary (Last 24 hours) at 09/30/2021 1328 Last data filed at 09/30/2021 1318 Gross per 24 hour  Intake 787.97 ml  Output 575 ml  Net 212.97 ml    Filed Weights   09/16/21 1200 09/16/21 1600 09/18/21 0500  Weight: 75 kg 75 kg 85.4 kg    Examination:  General exam: Appears in mild distress on the BiPAP Respiratory system: Diminished breath sounds at the bases to auscultation. Respiratory effort normal. Cardiovascular system: S1 & S2 heard, RRR. No JVD, murmurs, rubs, gallops or clicks. No pedal edema. Gastrointestinal system: Abdomen is nondistended, soft and nontender. No organomegaly or masses felt. Normal bowel sounds heard. Central nervous system: Alert and oriented. No focal neurological deficits. Extremities: 1+ edema Skin: No rashes, lesions or ulcers Psychiatry: Judgement and insight appear normal. Mood & affect appropriate.     Data Reviewed: I have personally reviewed following labs and imaging studies  CBC: Recent Labs  Lab 09/24/21 0458 09/24/21 2040 09/25/21 0316 09/26/21 0358 09/27/21 0521 09/28/21 0357 09/29/21 1423 09/30/21 0422  WBC 5.1  --  5.5 5.2 5.3 6.1 7.2 6.0  NEUTROABS 4.1  --  4.5 4.2 4.5 5.2  --   --   HGB 7.1*   < > 8.0* 8.2* 8.0* 8.0* 7.9* 7.6*  HCT 24.2*   < > 27.0* 28.3* 27.5* 28.1* 27.7* 26.5*  MCV 88.3  --  88.2 90.1 91.4 92.7 94.2 93.3  PLT 139*  --  135* 129* 117* 117* 112* 120*   < > = values in this interval not displayed.    Basic Metabolic Panel: Recent Labs  Lab 09/25/21 0316 09/26/21 0358 09/27/21 0521 09/28/21 0357 09/29/21 1423 09/30/21 0422  NA 137 137 138 141 137 138  K 3.8 4.2 4.2 4.7 4.8 4.5  CL 85* 86* 91* 95* 90* 91*  CO2 >45* 43* 44* 42* 42* 41*  GLUCOSE 80 116* 98 90 252* 84  BUN _1 24* 28*  CREATININE 1.42* 1.32* 1.50* 1.27* 1.28* 1.28*   CALCIUM 7.7* 8.0* 8.0* 8.0* 8.0* 8.4*  MG 1.8 2.2 2.0 2.0  --  1.8    GFR: Estimated Creatinine Clearance: 52.5 mL/min (A) (by C-G formula based on SCr of 1.28 mg/dL (H)). Liver Function Tests: Recent Labs  Lab 09/30/21 0422  AST 12*  ALT 13  ALKPHOS 50  BILITOT 0.6  PROT 6.0*  ALBUMIN 2.3*   No results for input(s): LIPASE, AMYLASE in the last 168 hours. No results for input(s): AMMONIA in the last 168 hours. Coagulation Profile: No results for input(s): INR, PROTIME in the last 168 hours. Cardiac Enzymes: No results for input(s): CKTOTAL, CKMB, CKMBINDEX, TROPONINI in the last 168 hours. BNP (last 3 results) No results for input(s): PROBNP in the last 8760 hours. HbA1C: No results for input(s): HGBA1C in the last 72 hours. CBG: No results for input(s): GLUCAP in the last 168 hours. Lipid Profile: No results for input(s): CHOL, HDL, LDLCALC, TRIG,  CHOLHDL, LDLDIRECT in the last 72 hours. Thyroid Function Tests: No results for input(s): TSH, T4TOTAL, FREET4, T3FREE, THYROIDAB in the last 72 hours. Anemia Panel: No results for input(s): VITAMINB12, FOLATE, FERRITIN, TIBC, IRON, RETICCTPCT in the last 72 hours. Sepsis Labs: No results for input(s): PROCALCITON, LATICACIDVEN in the last 168 hours.  No results found for this or any previous visit (from the past 240 hour(s)).    Radiology Studies: DG Chest 1 View  Result Date: 09/29/2021 CLINICAL DATA:  Shortness of breath. Left upper lobe adenocarcinoma. COPD. EXAM: CHEST  1 VIEW COMPARISON:  Multiple exams, including chest radiograph 09/28/2021 FINDINGS: Left upper lobe mass with known anterior rib destruction. Roughly stable right basilar airspace opacities with obscuration of portions of the right hemidiaphragm and of the right heart border indicating likely right lower lobe and right middle lobe involvement. Stable retrocardiac airspace opacity with obscuration left hemidiaphragm indicating left lower lobe involvement.  Minimally reduced hilar prominence. Kerley B lines are still present for example peripherally at the right lung base, but are less striking than on the prior exam. Right-sided PICC line tip: SVC. Degenerative glenohumeral arthropathy bilaterally, left greater than right. IMPRESSION: 1. Minimally reduced Kerley B lines compared to the prior exam suggesting slight improvement in the interstitial edema. 2. Stable bibasilar airspace opacities suspicious for multilobar pneumonia. 3. Stable left upper lobe mass with associated rib destruction. Electronically Signed   By: Van Clines M.D.   On: 09/29/2021 15:14     Scheduled Meds:  apixaban  5 mg Oral BID   Chlorhexidine Gluconate Cloth  6 each Topical Daily   digoxin  0.125 mg Intravenous Daily   feeding supplement  237 mL Oral TID BM   gabapentin  100 mg Oral TID   ipratropium-albuterol  3 mL Nebulization TID   mouth rinse  15 mL Mouth Rinse BID   predniSONE  20 mg Oral Q breakfast   PruTect   Topical BID   sodium chloride flush  10-40 mL Intracatheter Q12H   vitamin B-12  1,000 mcg Oral Daily   Continuous Infusions:  sodium chloride 10 mL/hr at 09/22/21 0559   sodium chloride Stopped (09/18/21 0523)   amiodarone 30 mg/hr (09/30/21 1318)   sodium chloride       LOS: 19 days    Georgette Shell, MD 09/30/2021, 1:28 PM

## 2021-09-30 NOTE — Progress Notes (Signed)
Progress Note  Patient Name: Patricia Sampson Date of Encounter: 09/30/2021  [facility] Cardiologist: Mertie Moores, MD   Subjective   On bipap In NSR this am Spoke with husband at bedside   Inpatient Medications    Scheduled Meds:  apixaban  5 mg Oral BID   Chlorhexidine Gluconate Cloth  6 each Topical Daily   feeding supplement  237 mL Oral TID BM   gabapentin  100 mg Oral TID   ipratropium-albuterol  3 mL Nebulization TID   mouth rinse  15 mL Mouth Rinse BID   predniSONE  20 mg Oral Q breakfast   PruTect   Topical BID   sodium chloride flush  10-40 mL Intracatheter Q12H   vitamin B-12  1,000 mcg Oral Daily   Continuous Infusions:  sodium chloride 10 mL/hr at 09/22/21 0559   sodium chloride Stopped (09/18/21 0523)   amiodarone 30 mg/hr (09/30/21 0652)   magnesium sulfate bolus IVPB 50 mL/hr at 09/30/21 8727   sodium chloride     PRN Meds: sodium chloride, acetaminophen **OR** acetaminophen, ammonium lactate, fentaNYL (SUBLIMAZE) injection, ipratropium-albuterol, LORazepam, magic mouthwash, ondansetron **OR** ondansetron (ZOFRAN) IV, oxyCODONE, sodium chloride, sodium chloride flush   Vital Signs    Vitals:   09/30/21 0500 09/30/21 0530 09/30/21 0600 09/30/21 0630  BP: (!) 107/59 106/63 111/65 (!) 113/54  Pulse: 89 90 92   Resp: _0 Temp:      TempSrc:      SpO2: 98% 98% 99%   Weight:      Height:        Intake/Output Summary (Last 24 hours) at 09/30/2021 0709 Last data filed at 09/30/2021 6184 Gross per 24 hour  Intake 658.29 ml  Output 575 ml  Net 83.29 ml   Last 3 Weights 09/18/2021 09/16/2021 09/16/2021  Weight (lbs) 188 lb 4.4 oz 165 lb 5.5 oz 165 lb 5.5 oz  Weight (kg) 85.4 kg 75 kg 75 kg      Telemetry    ST >> Atrial fib, RVR at 7:03 am - Personally Reviewed  ECG    No new tracing- Personally Reviewed  Physical Exam   Chronically ill black female Rhonchi bilaterally  BS positive Plus one edema No murmurs   Labs     High Sensitivity Troponin:  No results for input(s): TROPONINIHS in the last 720 hours.   Chemistry Recent Labs  Lab 09/27/21 0521 09/28/21 0357 09/29/21 1423 09/30/21 0422  NA 138 141 137 138  K 4.2 4.7 4.8 4.5  CL 91* 95* 90* 91*  CO2 44* 42* 42* 41*  GLUCOSE 98 90 252* 84  BUN 17 20 24* 28*  CREATININE 1.50* 1.27* 1.28* 1.28*  CALCIUM 8.0* 8.0* 8.0* 8.4*  MG 2.0 2.0  --  1.8  PROT  --   --   --  6.0*  ALBUMIN  --   --   --  2.3*  AST  --   --   --  12*  ALT  --   --   --  13  ALKPHOS  --   --   --  50  BILITOT  --   --   --  0.6  GFRNONAA 39* 48* 47* 47*  ANIONGAP 3* 4* 5 6    Lipids No results for input(s): CHOL, TRIG, HDL, LABVLDL, LDLCALC, CHOLHDL in the last 168 hours.  Hematology Recent Labs  Lab 09/28/21 0357 09/29/21 1423 09/30/21 0422  WBC 6.1 7.2 6.0  RBC 3.03* 2.94* 2.84*  HGB 8.0* 7.9* 7.6*  HCT 28.1* 27.7* 26.5*  MCV 92.7 94.2 93.3  MCH 26.4 26.9 26.8  MCHC 28.5* 28.5* 28.7*  RDW 19.1* 19.8* 19.8*  PLT 117* 112* 120*    Radiology    DG Chest 1 View  Result Date: 09/29/2021 CLINICAL DATA:  Shortness of breath. Left upper lobe adenocarcinoma. COPD. EXAM: CHEST  1 VIEW COMPARISON:  Multiple exams, including chest radiograph 09/28/2021 FINDINGS: Left upper lobe mass with known anterior rib destruction. Roughly stable right basilar airspace opacities with obscuration of portions of the right hemidiaphragm and of the right heart border indicating likely right lower lobe and right middle lobe involvement. Stable retrocardiac airspace opacity with obscuration left hemidiaphragm indicating left lower lobe involvement. Minimally reduced hilar prominence. Kerley B lines are still present for example peripherally at the right lung base, but are less striking than on the prior exam. Right-sided PICC line tip: SVC. Degenerative glenohumeral arthropathy bilaterally, left greater than right. IMPRESSION: 1. Minimally reduced Kerley B lines compared to the prior exam  suggesting slight improvement in the interstitial edema. 2. Stable bibasilar airspace opacities suspicious for multilobar pneumonia. 3. Stable left upper lobe mass with associated rib destruction. Electronically Signed   By: Van Clines M.D.   On: 09/29/2021 15:14    Cardiac Studies   2D echo 08/2021   1. Left ventricular ejection fraction, by estimation, is 55 to 60%. The  left ventricle has normal function. The left ventricle has no regional  wall motion abnormalities. Left ventricular diastolic parameters are  consistent with Grade I diastolic dysfunction (impaired relaxation).   2. There is mild respirophasic variation of interventricular septum  position, possibly due to the patient's underlying lung disease. Right  ventricular systolic function is normal. The right ventricular size is  normal.   3. The mitral valve is normal in structure. No evidence of mitral valve  regurgitation. No evidence of mitral stenosis.   4. The aortic valve is tricuspid. Aortic valve regurgitation is not  visualized. No aortic stenosis is present.   5. The inferior vena cava is dilated in size with <50% respiratory  variability, suggesting right atrial pressure of 15 mmHg.   Patient Profile     65 y.o. female with severe COPD with chronic hypoxic respiratory failure on 4-8L, HTN, HLD, remote tobacco abuse, lung mass 04/2021 at OSH later confirmed as lung cancer by pathology 06/2021 undergoing chemoradiation admitted with AMS, concern for sepsis, meningitis, and worsening respiratory failure. Imaging shows progressive bronchogenic carcinoma with increased destruction of anterior left second rib, new perilymphatic nodularity in the left upper lobe adjacent to the mass, consistent with lymphangitic carcinomatosis, and Unchanged and left suprahilar nodal metastatic disease. Requiring BiPAP for oxygenation; palliative care consultation planned for goals of care. Cardiology consulted for atrial fibrillation,  converted on IV amiodarone.  Assessment & Plan    acute on chronic hypoxic hypercapnic respiratory failure -palliative care - high flow oxygen  - continue diuresis for small effusions  2. PAF - continue amiodarone drip - digoxin  - In NSR this am  - iv digoxin    For questions or updates, please contact [place] Please consult [email or web address] for contact info under      Signed, Jenkins Rouge, MD  09/30/2021, 7:09 AM

## 2021-09-30 NOTE — Progress Notes (Signed)
Notified that Patricia Sampson has seen her grandchildren as she requested and is now stating that she is "ready" and wants to transition for focus on comfort.  Her husband is coming back to be with her.  Multiple other family are in to spend time with her.  I discussed with her son and daughter in the hall regarding comfort medications.  We discussed stopping Bipap when she desires to do so and focusing on relieving symptoms from that point forward with medication with a focus on pain, shortness of breath, anxiety, and secretions.    Family remains emotionally distraught with situation but state that they understand her decision.  I did enter prn medications for symptom management but we did not deescalate care at this time as waiting for family to arrive.   Micheline Rough, MD [facility] [phone]

## 2021-09-30 NOTE — Progress Notes (Signed)
  Amiodarone Drug - Drug Interaction Consult Note  Recommendations: Monitor QTc Monitor electrolytes. Goal K >/= 4, Mg >/= 2 New start digoxin on 11/5. DDI may result in increase serum concentration of digoxin. Recommending checking digoxin level in 3-5 days once at steady state and monitor for signs of cardiac glycoside toxicity   Amiodarone is metabolized by the cytochrome P450 system and therefore has the potential to cause many drug interactions. Amiodarone has an average plasma half-life of 50 days (range 20 to 100 days).   There is potential for drug interactions to occur several weeks or months after stopping treatment and the onset of drug interactions may be slow after initiating amiodarone.   _0  Statins: Increased risk of myopathy. Simvastatin- restrict dose to 58m daily. Other statins: counsel patients to report any muscle pain or weakness immediately.  _1  Anticoagulants: Amiodarone can increase anticoagulant effect. Consider warfarin dose reduction. Patients should be monitored closely and the dose of anticoagulant altered accordingly, remembering that amiodarone levels take several weeks to stabilize.  _2  Antiepileptics: Amiodarone can increase plasma concentration of phenytoin, the dose should be reduced. Note that small changes in phenytoin dose can result in large changes in levels. Monitor patient and counsel on signs of toxicity.  _3  Beta blockers: increased risk of bradycardia, AV block and myocardial depression. Sotalol - avoid concomitant use.  _4   Calcium channel blockers (diltiazem and verapamil): increased risk of bradycardia, AV block and myocardial depression.  _5   Cyclosporine: Amiodarone increases levels of cyclosporine. Reduced dose of cyclosporine is recommended.  _6  Digoxin dose should be halved when amiodarone is started.  _7  Diuretics: increased risk of cardiotoxicity if hypokalemia occurs.  _8  Oral hypoglycemic agents (glyburide, glipizide,  glimepiride): increased risk of hypoglycemia. Patient's glucose levels should be monitored closely when initiating amiodarone therapy.   _9  Drugs that prolong the QT interval:  Torsades de pointes risk may be increased with concurrent use - avoid if possible.  Monitor QTc, also keep magnesium/potassium WNL if concurrent therapy can't be avoided.  Antibiotics: e.g. fluoroquinolones, erythromycin.  Antiarrhythmics: e.g. quinidine, procainamide, disopyramide, sotalol.  Antipsychotics: e.g. phenothiazines, haloperidol.   Lithium, tricyclic antidepressants, and methadone.  Currently ordered ondansetron IV PRN, last dose charted on 11/1.  Thank You,  MLenis Noon PharmD 09/30/2021 11:04 AM

## 2021-09-30 NOTE — Progress Notes (Addendum)
NAME:  Patricia Sampson, MRN:  [id number], DOB:  January 01, 1956, LOS: 72 ADMISSION DATE:  09/15/2021, CONSULTATION DATE: 09/16/2021 REFERRING MD: Dr. Marylyn Ishihara, CHIEF COMPLAINT: Encephalopathy  BRIEF  65 year old woman with very severe COPD, associated chronic hypoxemic respiratory failure (4-8 L/min), hypertension, hyperlipidemia.  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 She was noted to have altered mental status on the morning of 10/17, appeared to be normal when she went to bed the night before.  In the emergency department she was tachycardic, tachypneic with pancytopenia.  Last chemotherapy infusion appears to have been 09/05/2021.  Acute metabolic encephalopathy.  She was treated empirically for possible sepsis, source unknown. Home med list with Percocet, Neurontin.  Her husband notes that he does not believe she is been taking any of her medicines for the last 2 to 3 days.   Pertinent  Medical History   Past Medical History:  Diagnosis Date   Angioedema    COPD (chronic obstructive pulmonary disease) (HCC)    FHx: migraine headaches    Hyperlipidemia    Hypertension    Seasonal allergies     Significant Hospital Events: Including procedures, antibiotic start and stop dates in addition to other pertinent events   CT abdomen pelvis 10/17 >> no acute findings Head CT 10/17 >> normal CT-PA chest 10/17 >> no new infiltrates or significant change in the left upper lobe mass.  No PE MRI brain 10/17 >> Motion degradation compromise interpretation, no acute or subacute infarct, no mass lesion  Progressive left upper lobe primary bronchogenic carcinoma with increased destruction of the anterior left second rib. New perilymphatic nodularity in the left upper lobe adjacent to the mass, consistent with lymphangitic carcinomatosis. 3. 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  EEG 10/18 with  generalized slowing, no evidence of active seizures or epileptiform activity 10/18 acyclovir and ampicillin added to cover possible meningitis 10/19 SVT, likely A. fib/flutter noted after adenosine given.  Hypotension with diltiazem, then started amiodarone.  Back in sinus tachycardia 10/20 - SVT with what looks like A. fib/flutter after adenosine given early evening 10/19.  No real response to diltiazem infusion, then given amiodarone.  Back in sinus tachycardia, . \atient continued to be aphasic and to have agitation through the day 10/19.  Now off Precedex. I/O positive 8.7 L total. Has had hypokalemia, replaced. WBC increasing, 2.9 this morning 10/20  10/21 -husband and son at the bedside.  They tell me that after she had COVID in 2020 she has been on 6 L oxygen.  For the last 3 months before admissions she has had 35 pound weight loss approximately.  August 2022 was diagnosed with cancers in September 2022 was on chemoradiation.  Since chemoradiation there is more failure to thrive with progressive decrease in activity particularly after chemoradiation cycles. Currently on 10 L nasal cannula pulse ox 95%.  Has not been on BiPAP.  Has not been on the ventilator.  Is not on pressors.  Not on Precedex  10/22 -had severe worsening of respiratory acidosis acute on chronic with a pH of 7.22 and a PCO2 of 90 associated with worsening bilateral lower lobe atelectasis [has left upper lobe cancer] on chest x-ray compared to 09/12/2021.  Treated with BiPAP.  Blood gas subsequent improvement but did not tolerate BiPAP.  Nonetheless mental status today better.  Husband also acknowledges that she is looking better.  Cachectic elderly female.  Significant physical deconditioning .  currently on 6 L nasal cannula pulse ox 95%. Continues on IV heparin infusion and amio infusion.  On acyclovir and ampicillin and ceftriaxone for potential meningitis.  Afebrile since 09/13/2021 but leukopenic with a white count of 2.4K.   Culture negative so far Pall care consult - full code + but family meeting being arranged  09/22/21 - Today patient states she is nauseated. RN had to increase her oxygen from 6 L to 8 L . She has some skin burning/ pain from radiation on her left chest and back. Dr. Earlie Server spoke with patient 10/27 about the fact he did not feel she was a good candidate for further chemotherapy, or targeted therapy, because of her poor performance status and worsening respiratory failure. Palliation was consulted, but patient remains a Full Code.  Family are at bedside  11/2 doing a little better w/ night time BIPAP ~ 4 hrs. Oxygen requirements had been down to 6, now closer to 10  after exertion. IVFs added back on 11/1  11/4 PCCM re-consulted, pt transferred back to stepdown today after converted to Afib and was hypotensive, started on amiodarone gtt and converted back to sinus.  ABG showed respiratory acidosis, back on Bipap      Interim History / Subjective:   NIV dependent, remains in NSR. Cardiology has added digoxin.  Objective   Blood pressure 114/65, pulse 91, temperature 99 F (37.2 C), temperature source Axillary, resp. rate 16, height _0  (1.753 m), weight 85.4 kg, SpO2 95 %.    FiO2 (%):  [65 %-80 %] 65 % Estimated body mass index is 27.8 kg/m as calculated from the following:   Height as of this encounter: _1  (1.753 m).   Weight as of this encounter: 85.4 kg.   Intake/Output Summary (Last 24 hours) at 09/30/2021 1241 Last data filed at 09/30/2021 1200 Gross per 24 hour  Intake 766.25 ml  Output 575 ml  Net 191.25 ml   Filed Weights   09/16/21 1200 09/16/21 1600 09/18/21 0500  Weight: 75 kg 75 kg 85.4 kg     General:  thin, elderly F awake and sitting up wearing NIV mask HEENT: MM dry Neuro: drowsy with hypercapnic jerks, answering questions CV: s1s2 rrr, no m/r/g PULM:  diminished over left, equal chest rise GI: soft, bsx4 hypoactive, nontender Extremities: warm/dry, no  edema  Skin: no rashes or lesions       LABS    PULMONARY Recent Labs  Lab 09/25/21 1256 09/28/21 1155 09/29/21 1435 09/30/21 0327 09/30/21 0835  PHART 7.389 7.304* 7.280* 7.301* 7.330*  PCO2ART 76.9* 85.3* 92.0* 90.1* 82.8*  PO2ART 58.4* 58.1* 64.2* 99.8 134*  HCO3 45.4* 41.1* 41.8* 43.1* 42.4*  O2SAT 90.1 89.1 90.6 98.3 99.3    CBC Recent Labs  Lab 09/28/21 0357 09/29/21 1423 09/30/21 0422  HGB 8.0* 7.9* 7.6*  HCT 28.1* 27.7* 26.5*  WBC 6.1 7.2 6.0  PLT 117* 112* 120*    COAGULATION No results for input(s): INR in the last 168 hours.  CARDIAC  No results for input(s): TROPONINI in the last 168 hours. No results for input(s): PROBNP in the last 168 hours.   CHEMISTRY Recent Labs  Lab 09/25/21 0316 09/26/21 0358 09/27/21 0521 09/28/21 0357 09/29/21 1423 09/30/21 0422  NA 137 137 138 141 137 138  K 3.8 4.2 4.2 4.7 4.8 4.5  CL 85* 86* 91* 95* 90* 91*  CO2 >45* 43* 44* 42* 42* 41*  GLUCOSE 80 116* 98 90 252* 84  BUN  _0 24* 28*  CREATININE 1.42* 1.32* 1.50* 1.27* 1.28* 1.28*  CALCIUM 7.7* 8.0* 8.0* 8.0* 8.0* 8.4*  MG 1.8 2.2 2.0 2.0  --  1.8   Estimated Creatinine Clearance: 52.5 mL/min (A) (by C-G formula based on SCr of 1.28 mg/dL (H)).   LIVER Recent Labs  Lab 09/30/21 0422  AST 12*  ALT 13  ALKPHOS 50  BILITOT 0.6  PROT 6.0*  ALBUMIN 2.3*     INFECTIOUS No results for input(s): LATICACIDVEN, PROCALCITON in the last 168 hours.   ENDOCRINE CBG (last 3)  No results for input(s): GLUCAP in the last 72 hours.       IMAGING x48h  - image(s) personally visualized  -   highlighted in bold DG Chest 1 View  Result Date: 09/29/2021 CLINICAL DATA:  Shortness of breath. Left upper lobe adenocarcinoma. COPD. EXAM: CHEST  1 VIEW COMPARISON:  Multiple exams, including chest radiograph 09/28/2021 FINDINGS: Left upper lobe mass with known anterior rib destruction. Roughly stable right basilar airspace opacities with obscuration  of portions of the right hemidiaphragm and of the right heart border indicating likely right lower lobe and right middle lobe involvement. Stable retrocardiac airspace opacity with obscuration left hemidiaphragm indicating left lower lobe involvement. Minimally reduced hilar prominence. Kerley B lines are still present for example peripherally at the right lung base, but are less striking than on the prior exam. Right-sided PICC line tip: SVC. Degenerative glenohumeral arthropathy bilaterally, left greater than right. IMPRESSION: 1. Minimally reduced Kerley B lines compared to the prior exam suggesting slight improvement in the interstitial edema. 2. Stable bibasilar airspace opacities suspicious for multilobar pneumonia. 3. Stable left upper lobe mass with associated rib destruction. Electronically Signed   By: Van Clines M.D.   On: 09/29/2021 15:14      Resolved Hospital Problem list     Assessment & Plan:   1) Acute on chronic respiratory failure with hypoxia and hypercapnia 2) Acute COPD exacerbation  3) progressive lung adenocarcinoma: Stage IIIA (T4, N1, M0)  adenocarcinoma - presented with large left upper lobe lung mass with chest wall invasion in addition to left suprahilar lymphadenopathy diagnosed in August 2022.  Despite treatment she has had progression with rib destruction. No further chemo or biologic Rx  per Dr Julien Nordmann dsicussion with ccm  4) GOLD IV/class D COPD: Her baseline lung function is severely impaired (FEV1 24% predicted in 2021) Acute Encephalopathy w/ agitated Delirium (resolved) 5) SVT & PAF: currently NSR 6) Chronic anemia, stable 7) Acute thrombocytopenia 8) Acute leukopenia 9) Hyponatremia, likely hypervolemic- resolved 10) Hypokalemia, resolved 11) Mild hypomagnesmia 12 ) AKI 13) adult Failure to thrive 14) severe protein calorie malnutrition 15) cancer related pain and chronic opioid use w/ acute on chronic pain  16) physical deconditioning -  severe   Pulmonary Problem list  Acute on chronic hypoxic and hypercarbic respiratory failure. Multifactorial. Initially d/t AECOPD. Now really more chronic: advanced COPD, advanced adenocarcinoma w/ fairly significant structural damage to the left lung, and suspect some degree of secondary PH (although looks like RV size nml on recent ECHO.) Little to be done in regards to improvement.   11/2: APP  used a car engine to explain her current condition. At one time her body was running on a 6 cylinder engine and now she is functioning on 2. I am doubtful we can get those 4 cylinders back so we need to focus on how we maximize the last 2. She seemed to understand  this. We did briefly discuss trach/vent. I do not think she is a good candidate for trach and she indicates to me that she would not want long term vent or trach.   11/3 - 7L o2 need. No better. Dedonditione. ECOG4. In bed. Will benefit from trilogy QHS at home. See justficiation if they decline home hospice -  Patient's Patricia Sampson with 03-Sep-1956  has chronic hypercapnic respiratory failure due to copd, post copvid is life threatening.  Previous ABG's have documented high PCO2 and spirometry reveals severe obstructive ventilatory defect.  Patient would benefit from non-invasive ventilation.  Without this therapy, the patient is at high risk of ending up with worsening symptoms, worsened respiratory failure, need for ER visits and/or recurrent hospitalizations.  Bilevel device unable to adequately support patient's nocturnal ventilation needs.  Patient would benefit from NIV therapy with set tidal volumes and pressure.    11/4-11/5 AF/RVR added amio, digoxin. Hypotension improved with conversion to NSR. She has been NIV-dependent round the clock.   Plan - She is dependent on NIV throughout the day over the last 24h to remain alert at all. She desaturates whenever mask removed when attempting to eat or take pill - I don't realistically  see her recovering from her respiratory failure on a timeframe that would make her a candidate for treatment of her lung cancer. Even in the most optimistic of circumstances her recovery would likely involve mechanical ventilation, tracheostomy, and months during which her cancer will progress. She is facing the end of her life. I discussed all of this with Ms. Collison and her husband Abe People. I emphasized that we still have an opportunity to prioritize her comfort and allow her to die with dignity. He is planning to call family and discuss further - appreciate palliative care, cardiology, primary team involvement     Best Practice (right click and "Reselect all SmartList Selections" daily)   Per [place]   This patient is critically ill with acute on chronic hypoxic hypercapnic respiratory failure; which, requires frequent high complexity decision making, assessment, support, evaluation, and titration of therapies. This was completed through the application of advanced monitoring technologies and extensive interpretation of multiple databases. During this encounter critical care time was devoted to patient care services described in this note for 32 minutes.   [place]

## 2021-09-30 NOTE — Progress Notes (Signed)
[facility] Progress Note Patient Name: Patricia Sampson DOB: 02/11/1956 MRN: [id number]   Date of Service  09/30/2021  HPI/Events of Note  Hypoxic and Hypercarbic Respiratory Failure - ABG on 80% BiPAP = 7.301/90.1/99.8/43.1. HCO3- = 43.1 suggests chronic hypercarbia with a pCO2 of about 80. RR = 16.  eICU Interventions  Plan: Continue BiPAP.     Intervention Category Major Interventions: Other:  Patricia Sampson 09/30/2021, 5:10 AM

## 2021-10-01 DIAGNOSIS — Z7189 Other specified counseling: Secondary | ICD-10-CM | POA: Diagnosis not present

## 2021-10-01 DIAGNOSIS — C3492 Malignant neoplasm of unspecified part of left bronchus or lung: Secondary | ICD-10-CM | POA: Diagnosis not present

## 2021-10-01 DIAGNOSIS — J9621 Acute and chronic respiratory failure with hypoxia: Secondary | ICD-10-CM | POA: Diagnosis not present

## 2021-10-01 DIAGNOSIS — R6510 Systemic inflammatory response syndrome (SIRS) of non-infectious origin without acute organ dysfunction: Secondary | ICD-10-CM | POA: Diagnosis not present

## 2021-10-01 MED ORDER — MORPHINE SULFATE (PF) 2 MG/ML IV SOLN
2.0000 mg | INTRAVENOUS | Status: DC | PRN
  Administered 2021-10-01 – 2021-10-02 (×3): 4 mg via INTRAVENOUS
  Administered 2021-10-03 – 2021-10-05 (×7): 2 mg via INTRAVENOUS
  Administered 2021-10-05: 4 mg via INTRAVENOUS
  Administered 2021-10-05 – 2021-10-06 (×4): 2 mg via INTRAVENOUS
  Administered 2021-10-06 (×2): 4 mg via INTRAVENOUS
  Administered 2021-10-06 – 2021-10-09 (×9): 2 mg via INTRAVENOUS
  Administered 2021-10-09: 4 mg via INTRAVENOUS
  Administered 2021-10-09 – 2021-10-11 (×5): 2 mg via INTRAVENOUS
  Filled 2021-10-01: qty 1
  Filled 2021-10-01: qty 2
  Filled 2021-10-01 (×3): qty 1
  Filled 2021-10-01 (×2): qty 2
  Filled 2021-10-01 (×5): qty 1
  Filled 2021-10-01: qty 2
  Filled 2021-10-01: qty 1
  Filled 2021-10-01 (×2): qty 2
  Filled 2021-10-01 (×5): qty 1
  Filled 2021-10-01 (×2): qty 2
  Filled 2021-10-01 (×3): qty 1
  Filled 2021-10-01: qty 2
  Filled 2021-10-01 (×2): qty 1
  Filled 2021-10-01: qty 2
  Filled 2021-10-01: qty 1

## 2021-10-01 MED ORDER — MORPHINE SULFATE (PF) 2 MG/ML IV SOLN
2.0000 mg | Freq: Once | INTRAVENOUS | Status: AC
  Administered 2021-10-01: 2 mg via INTRAVENOUS

## 2021-10-01 NOTE — Progress Notes (Signed)
OT Cancellation Note  Patient Details Name: Patricia Sampson MRN: [id number] DOB: 23-Oct-1956   Cancelled Treatment:    Reason Eval/Treat Not Completed: Other (comment). Patient has been made full comfort care. OT will sign off. Please reorder of GOC change.   Valrie Jia L Jayra Choyce 10/01/2021, 1:58 PM

## 2021-10-01 NOTE — TOC Progression Note (Signed)
Transition of Care [facility]) - Progression Note    Patient Details  Name: Patricia Sampson MRN: [id number] Date of Birth: [birth year]  Transition of Care [facility]) CM/SW [place], [place] Phone Number: 10/01/2021, 7:47 AM  Clinical Narrative:     Patient and family met with Chaplain and Palliative Care MD yesterday 09/30/2021.  Patient decided to transition to comfort care.  Expected Discharge Plan: Home/Self Care Barriers to Discharge: Barriers Resolved  Expected Discharge Plan and Services Expected Discharge Plan: Home/Self Care   Discharge Planning Services: CM Consult   Living arrangements for the past 2 months: Single Family Home                 DME Arranged: Ventilator (TRIOLOGY FOR BIPAP) DME Agency: [employer] Date DME Agency Contacted: 09/26/21   Representative spoke with at DME Agency: [employer]             Social Determinants of Health ([place]) Interventions    Readmission Risk Interventions No flowsheet data found.

## 2021-10-01 NOTE — Progress Notes (Signed)
Palliative Care Progress note  Ms. Scovell' son and daughter stopped me in the hall to inquire about potential for home with hospice support.    We discussed hospice care they provide and logistics involved.    Plan to assess in the morning and, if stable, will work toward home with hospice if family desires.  Micheline Rough, MD [place] Team [phone]

## 2021-10-01 NOTE — Progress Notes (Signed)
PROGRESS NOTE    Patricia Sampson  MOQ:[id number] DOB: Sep 22, 1956 DOA: 09/25/2021 PCP: Greig Right, MD  Brief Narrative: Patricia Sampson is a 65 y.o. female with PMH Stage IIIa LUL adenocarcinoma, chronic hypoxic respiratory failure 6L O2 at home, HTN, HLD who presented with AMS. She underwent extensive workup but ultimately an LP was declined. She was treated empirically with abx and acyclovir and BiPAP. Her mentation improved with BiPAP around-the-clock. ABG showed acute respiratory acidosis with CO2 above 80 and severe hypoxia  Assessment & Plan:   Active Problems:   COPD (chronic obstructive pulmonary disease) (HCC)   Adenocarcinoma of left lung, stage 3 (HCC)   PAF (paroxysmal atrial fibrillation) (HCC)   Acute on chronic respiratory failure with hypoxia and hypercapnia (HCC)   Palliative care by specialist   Goals of care, counseling/discussion   Normocytic anemia   AKI (acute kidney injury) ([place])   Atrial fibrillation with rapid ventricular response ([place])  #1 acute on chronic hypoxic hypercapnic respiratory failure-patient with left upper lobe primary bronchogenic carcinoma with increased destruction of the anterior left second rib.  At baseline she is on 6 L of oxygen at home at rest.  With activity she has to go up to 10 L.   ABG with persistent hypercapnia was placed back on BiPAP overnight she remained on BiPAP all night.  She was unable to tolerate off BiPAP to eat or take pills as she desaturated rapidly. Appreciate PCCM input. Patient and the family wants to be a full code She has stage III adenocarcinoma of the left upper lobe with invasion of the chest wall and left suprahilar lymphadenopathy diagnosed in August 2022. Patient is not a candidate for any further chemotherapy and oncology has recommended palliative care/hospice  09/17/2021 CT -  Progressive left upper lobe primary bronchogenic carcinoma with increased destruction of the anterior left second rib. New  perilymphatic nodularity in the left upper lobe adjacent to the mass, consistent with lymphangitic carcinomatosis. 3. 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  ABG 11/3-7.3 0/85/58 ABG 11/4-reportedly CO2 in the 90s. Patient placed back on BiPAP ABG 09/30/2021 7.3 3/82/134 this was done after being on BiPAP all night. Chest x-ray 09/29/2021 minimally reduced curly B-lines compared to prior exam suggesting slight improvement in interstitial edema.  Stable bibasilar airspace opacities suspicious for multilobar pneumonia.  Stable left upper lobe mass with associated rib destruction.  PATIENT AND FAMILY DECIDED ON COMFORT CARE.STILL ON BIPAP.SHE WILL LET us KNOW WHEN TO TAKE OFF THE BIPAP   #2 A. fib RVR patient with history of paroxysmal A. fib. She is now in normal sinus rhythm.    #3 AKI -creatinine 1.28 which may be her new baseline.  #4 acute metabolic encephalopathy multifactorial etiology, hypoxia, hypercapnia  #5 thrombocytopenia platelets around 120 stable.   Estimated body mass index is 27.8 kg/m as calculated from the following:   Height as of this encounter: _0  (1.753 m).   Weight as of this encounter: 85.4 kg.  DVT prophylaxis: Eliquis  code Status: Full code  family Communication: None  disposition Plan:  Status is: Inpatient  Remains inpatient appropriate because: Hypoxic respiratory failure with hypercapnia    Consultants:  PCCM  Procedures: None  Subjective: She is resting in bed with BiPAP on husband by the bedside resting Overnight no significant events CODE STATUS was changed to DNR/DNI She will decide when to take off the BiPAP completely at that point she will be placed on oxygen for comfort  Objective:  Vitals:   10/01/21 0843 10/01/21 0900 10/01/21 1000 10/01/21 1200  BP:  (!) 162/86 (!) 151/60   Pulse:   88 94  Resp:  _0 Temp: 98 F (36.7 C)     TempSrc: Axillary     SpO2:   96% (!) 89%  Weight:      Height:         Intake/Output Summary (Last 24 hours) at 10/01/2021 1301 Last data filed at 10/01/2021 0900 Gross per 24 hour  Intake 21.72 ml  Output 1150 ml  Net -1128.28 ml    Filed Weights   09/16/21 1200 09/16/21 1600 09/18/21 0500  Weight: 75 kg 75 kg 85.4 kg    Examination:  General exam: Appears in mild distress on the BiPAP Respiratory system: Diminished breath sounds at the bases to auscultation. Respiratory effort normal. Cardiovascular system: S1 & S2 heard, RRR. No JVD, murmurs, rubs, gallops or clicks. No pedal edema. Gastrointestinal system: Abdomen is nondistended, soft and nontender. No organomegaly or masses felt. Normal bowel sounds heard. Central nervous system: Alert and oriented. No focal neurological deficits. Extremities: 1+ edema Skin: No rashes, lesions or ulcers Psychiatry: Judgement and insight appear normal. Mood & affect appropriate.     Data Reviewed: I have personally reviewed following labs and imaging studies  CBC: Recent Labs  Lab 09/25/21 0316 09/26/21 0358 09/27/21 0521 09/28/21 0357 09/29/21 1423 09/30/21 0422  WBC 5.5 5.2 5.3 6.1 7.2 6.0  NEUTROABS 4.5 4.2 4.5 5.2  --   --   HGB 8.0* 8.2* 8.0* 8.0* 7.9* 7.6*  HCT 27.0* 28.3* 27.5* 28.1* 27.7* 26.5*  MCV 88.2 90.1 91.4 92.7 94.2 93.3  PLT 135* 129* 117* 117* 112* 120*    Basic Metabolic Panel: Recent Labs  Lab 09/25/21 0316 09/26/21 0358 09/27/21 0521 09/28/21 0357 09/29/21 1423 09/30/21 0422  NA 137 137 138 141 137 138  K 3.8 4.2 4.2 4.7 4.8 4.5  CL 85* 86* 91* 95* 90* 91*  CO2 >45* 43* 44* 42* 42* 41*  GLUCOSE 80 116* 98 90 252* 84  BUN _1 24* 28*  CREATININE 1.42* 1.32* 1.50* 1.27* 1.28* 1.28*  CALCIUM 7.7* 8.0* 8.0* 8.0* 8.0* 8.4*  MG 1.8 2.2 2.0 2.0  --  1.8    GFR: Estimated Creatinine Clearance: 52.5 mL/min (A) (by C-G formula based on SCr of 1.28 mg/dL (H)). Liver Function Tests: Recent Labs  Lab 09/30/21 0422  AST 12*  ALT 13  ALKPHOS 50  BILITOT 0.6   PROT 6.0*  ALBUMIN 2.3*    No results for input(s): LIPASE, AMYLASE in the last 168 hours. No results for input(s): AMMONIA in the last 168 hours. Coagulation Profile: No results for input(s): INR, PROTIME in the last 168 hours. Cardiac Enzymes: No results for input(s): CKTOTAL, CKMB, CKMBINDEX, TROPONINI in the last 168 hours. BNP (last 3 results) No results for input(s): PROBNP in the last 8760 hours. HbA1C: No results for input(s): HGBA1C in the last 72 hours. CBG: No results for input(s): GLUCAP in the last 168 hours. Lipid Profile: No results for input(s): CHOL, HDL, LDLCALC, TRIG, CHOLHDL, LDLDIRECT in the last 72 hours. Thyroid Function Tests: No results for input(s): TSH, T4TOTAL, FREET4, T3FREE, THYROIDAB in the last 72 hours. Anemia Panel: No results for input(s): VITAMINB12, FOLATE, FERRITIN, TIBC, IRON, RETICCTPCT in the last 72 hours. Sepsis Labs: No results for input(s): PROCALCITON, LATICACIDVEN in the last 168 hours.  No results found for this or any previous  visit (from the past 240 hour(s)).    Radiology Studies: DG Chest 1 View  Result Date: 09/29/2021 CLINICAL DATA:  Shortness of breath. Left upper lobe adenocarcinoma. COPD. EXAM: CHEST  1 VIEW COMPARISON:  Multiple exams, including chest radiograph 09/28/2021 FINDINGS: Left upper lobe mass with known anterior rib destruction. Roughly stable right basilar airspace opacities with obscuration of portions of the right hemidiaphragm and of the right heart border indicating likely right lower lobe and right middle lobe involvement. Stable retrocardiac airspace opacity with obscuration left hemidiaphragm indicating left lower lobe involvement. Minimally reduced hilar prominence. Kerley B lines are still present for example peripherally at the right lung base, but are less striking than on the prior exam. Right-sided PICC line tip: SVC. Degenerative glenohumeral arthropathy bilaterally, left greater than right.  IMPRESSION: 1. Minimally reduced Kerley B lines compared to the prior exam suggesting slight improvement in the interstitial edema. 2. Stable bibasilar airspace opacities suspicious for multilobar pneumonia. 3. Stable left upper lobe mass with associated rib destruction. Electronically Signed   By: Van Clines M.D.   On: 09/29/2021 15:14     Scheduled Meds:  apixaban  5 mg Oral BID   Chlorhexidine Gluconate Cloth  6 each Topical Daily   digoxin  0.125 mg Intravenous Daily   feeding supplement  237 mL Oral TID BM   gabapentin  100 mg Oral TID   mouth rinse  15 mL Mouth Rinse BID   predniSONE  20 mg Oral Q breakfast   PruTect   Topical BID   sodium chloride flush  10-40 mL Intracatheter Q12H   vitamin B-12  1,000 mcg Oral Daily   Continuous Infusions:  sodium chloride 10 mL/hr at 09/22/21 0559   sodium chloride Stopped (09/18/21 0523)   amiodarone 30 mg/hr (10/01/21 0255)   sodium chloride       LOS: 20 days    Georgette Shell, MD 10/01/2021, 1:01 PM

## 2021-10-01 NOTE — Progress Notes (Signed)
Daily Progress Note   Patient Name: Patricia Sampson       Date: 10/01/2021 DOB: 1955/12/14  Age: 65 y.o. MRN#: [id number] Attending Physician: Georgette Shell, MD Primary Care Physician: Greig Right, MD Admit Date: 09/05/2021  Reason for Consultation/Follow-up: Establishing goals of care  Subjective: I saw and examined Ms. Welle shortly after she came off of BiPAP.  She had some increased shortness of breath immediately following BiPAP removal and a second dose of morphine was given with improvement in her symptoms of shortness of breath.  She said that she still felt as though she was working a little bit to breathe whenever I saw her, but she was not in significant distress at that time.  I talked with her about utilization of medication as needed for shortness of breath and that I was going to liberalize pain medication so it was readily available as she needed it.  We discussed goal moving forward of her being comfortable and being able to see family for what ever time she has left.  Length of Stay: 20  Current Medications: Scheduled Meds:  . apixaban  5 mg Oral BID  . Chlorhexidine Gluconate Cloth  6 each Topical Daily  . digoxin  0.125 mg Intravenous Daily  . feeding supplement  237 mL Oral TID BM  . gabapentin  100 mg Oral TID  . mouth rinse  15 mL Mouth Rinse BID  . predniSONE  20 mg Oral Q breakfast  . PruTect   Topical BID  . sodium chloride flush  10-40 mL Intracatheter Q12H  . vitamin B-12  1,000 mcg Oral Daily    Continuous Infusions: . sodium chloride 10 mL/hr at 09/22/21 0559  . sodium chloride Stopped (09/18/21 0523)  . amiodarone 30 mg/hr (10/01/21 0255)  . sodium chloride      PRN Meds: sodium chloride, acetaminophen **OR** acetaminophen, ammonium  lactate, fentaNYL (SUBLIMAZE) injection, glycopyrrolate, haloperidol lactate, ipratropium-albuterol, lip balm, LORazepam, magic mouthwash, morphine injection, morphine injection, ondansetron **OR** ondansetron (ZOFRAN) IV, oxyCODONE, sodium chloride, sodium chloride flush  Physical Exam         General: awake alert oriented, some increased work of breathing off BiPAP. HEENT: now on O2 [place].  heart: Regular rate and rhythm. No murmur appreciated. Lungs: Diminished air movement Ext: No significant edema Skin: Warm and  dry  Vital Signs: BP (!) 151/60   Pulse 94   Temp 98 F (36.7 C) (Axillary)   Resp 20   Ht _0  (1.753 m)   Wt 85.4 kg   SpO2 (!) 89%   BMI 27.80 kg/m  SpO2: SpO2: (!) 89 % O2 Device: O2 Device: Bi-PAP O2 Flow Rate: O2 Flow Rate (L/min): 10 L/min  Intake/output summary:  Intake/Output Summary (Last 24 hours) at 10/01/2021 1228 Last data filed at 10/01/2021 0900 Gross per 24 hour  Intake 21.72 ml  Output 1150 ml  Net -1128.28 ml    LBM: Last BM Date: 09/30/21 Baseline Weight: Weight: 75 kg Most recent weight: Weight: 85.4 kg       Palliative Assessment/Data:    Flowsheet Rows    Flowsheet Row Most Recent Value  Intake Tab   Referral Department Hospitalist  Unit at Time of Referral ICU  Palliative Care Primary Diagnosis Sepsis/Infectious Disease  Date Notified 09/15/21  Palliative Care Type New Palliative care  Reason for referral Clarify Goals of Care  Date of Admission 09/04/2021  Date first seen by Palliative Care 09/16/21  # of days Palliative referral response time 1 Day(s)  # of days IP prior to Palliative referral 4  Clinical Assessment   Palliative Performance Scale Score 40%  Psychosocial & Spiritual Assessment   Palliative Care Outcomes        Patient Active Problem List   Diagnosis Date Noted  . Atrial fibrillation with rapid ventricular response ([place])   . Normocytic anemia 09/23/2021  . AKI (acute kidney injury) ([place]) 09/23/2021  .  Palliative care by specialist   . Goals of care, counseling/discussion   . Acute on chronic respiratory failure with hypoxia and hypercapnia (HCC)   . PAF (paroxysmal atrial fibrillation) ([place])   . Microcytic anemia 08/21/2021  . Adenocarcinoma of left lung, stage 3 ([place]) 07/19/2021  . Encounter for antineoplastic chemotherapy 07/19/2021  . Cancer associated pain 07/19/2021  . Pneumonia due to COVID-19 virus 11/25/2019  . Shortness of breath 11/18/2019  . Chronic respiratory failure with hypoxia ([place]) 11/18/2019  . COPD (chronic obstructive pulmonary disease) ([place]) 01/27/2014  . Hypoxemia 01/27/2014  . Secondary pulmonary arterial hypertension ([place]) 01/27/2014    Palliative Care Assessment & Plan   Patient Profile: 65 y.o. female  with past medical history of severe COPD, chronic hypoxic respiratory failure on 4-8 L, hypertension, hyperlipidemia with recent diagnosis of stage III adenocarcinoma with large left upper lobe mass and chest wall invasion in August of this year who is currently undergoing chemoradiation admitted on 09/17/2021 with altered mental status with concern for sepsis.  Her mental status over the past several days has remained poor and far from her baseline.  Palliative consulted for goals of care.  Recommendations/Plan: DNR/DNI Goal is comfort moving forward.  She removed BiPAP and plan moving forward is to continue with medications as needed for symptoms as they arise.  I liberalized the availability of morphine to 2 to 4 mg every 10 minutes if she is having uncontrolled shortness of breath. Discussed with bedside care team and then will call if further symptoms arise.     Goals of Care and Additional Recommendations: Limitations on Scope of Treatment: Full Scope Treatment  Code Status:    Code Status Orders  (From admission, onward)           Start     Ordered   09/18/2021 2128  Full code  Continuous  09/17/2021 2127           Code Status  History     This patient has a current code status but no historical code status.       Prognosis: Likely hours  Discharge Planning: Anticipate hospital death  Care plan was discussed with patient, family, bedside interdisciplinary care team  Thank you for allowing the Palliative Medicine Team to assist in the care of this patient.   Total Time 25 Prolonged Time Billed No   Greater than 50%  of this time was spent counseling and coordinating care related to the above assessment and plan.   Micheline Rough, MD  Please contact Palliative Medicine Team phone at [phone] for questions and concerns.

## 2021-10-02 ENCOUNTER — Ambulatory Visit: Payer: 59

## 2021-10-02 DIAGNOSIS — J9621 Acute and chronic respiratory failure with hypoxia: Secondary | ICD-10-CM | POA: Diagnosis not present

## 2021-10-02 DIAGNOSIS — C3492 Malignant neoplasm of unspecified part of left bronchus or lung: Secondary | ICD-10-CM | POA: Diagnosis not present

## 2021-10-02 DIAGNOSIS — Z7189 Other specified counseling: Secondary | ICD-10-CM | POA: Diagnosis not present

## 2021-10-02 DIAGNOSIS — R6510 Systemic inflammatory response syndrome (SIRS) of non-infectious origin without acute organ dysfunction: Secondary | ICD-10-CM | POA: Diagnosis not present

## 2021-10-02 NOTE — Progress Notes (Signed)
Daily Progress Note   Patient Name: Patricia Sampson       Date: 10/02/2021 DOB: [birth year]  Age: 65 y.o. MRN#: [id number] Attending Physician: Georgette Shell, MD Primary Care Physician: Greig Right, MD Admit Date: 08/31/2021  Reason for Consultation/Follow-up: Establishing goals of care  Subjective: I saw and examined [name] today.  She opened her eyes intermittently but was otherwise unresponsive.  Multiple family members surrounding her at the bedside.  No signs of distress and I talked with him about nonverbal indications of pain or distress.  Family expresses that they think that we are past the point of being able to safely work towards transition home with hospice.  I would agree with this assessment and plan moving forward is for hospital death.  Length of Stay: 21  Current Medications: Scheduled Meds:  . Chlorhexidine Gluconate Cloth  6 each Topical Daily  . mouth rinse  15 mL Mouth Rinse BID  . predniSONE  20 mg Oral Q breakfast  . PruTect   Topical BID  . sodium chloride flush  10-40 mL Intracatheter Q12H  . vitamin B-12  1,000 mcg Oral Daily    Continuous Infusions: . sodium chloride 10 mL/hr at 09/22/21 0559  . sodium chloride Stopped (09/18/21 0523)  . amiodarone Stopped (10/01/21 2000)  . sodium chloride      PRN Meds: sodium chloride, acetaminophen **OR** acetaminophen, ammonium lactate, fentaNYL (SUBLIMAZE) injection, glycopyrrolate, haloperidol lactate, ipratropium-albuterol, lip balm, LORazepam, magic mouthwash, morphine injection, morphine injection, ondansetron **OR** ondansetron (ZOFRAN) IV, oxyCODONE, sodium chloride, sodium chloride flush  Physical Exam         General: Sleepy and minimally interactive. HEENT: now on O2 [place].  heart: Regular  rate and rhythm. No murmur appreciated. Lungs: Diminished air movement Ext: No significant edema Skin: Warm and dry  Vital Signs: BP (!) 95/58   Pulse 99   Temp 98 F (36.7 C) (Axillary)   Resp (!) 24   Ht _0  (1.753 m)   Wt 85.4 kg   SpO2 95%   BMI 27.80 kg/m  SpO2: SpO2: 95 % O2 Device: O2 Device: High Flow Nasal Cannula O2 Flow Rate: O2 Flow Rate (L/min): 10 L/min  Intake/output summary:  Intake/Output Summary (Last 24 hours) at 10/02/2021 2244 Last data filed at 10/02/2021 1900 Gross per 24 hour  Intake --  Output 1150 ml  Net -1150 ml    LBM: Last BM Date: 10/02/21 Baseline Weight: Weight: 75 kg Most recent weight: Weight: 85.4 kg       Palliative Assessment/Data:    Flowsheet Rows    Flowsheet Row Most Recent Value  Intake Tab   Referral Department Hospitalist  Unit at Time of Referral ICU  Palliative Care Primary Diagnosis Sepsis/Infectious Disease  Date Notified 09/15/21  Palliative Care Type New Palliative care  Reason for referral Clarify Goals of Care  Date of Admission 09/06/2021  Date first seen by Palliative Care 09/16/21  # of days Palliative referral response time 1 Day(s)  # of days IP prior to Palliative referral 4  Clinical Assessment   Palliative Performance Scale Score 40%  Psychosocial & Spiritual Assessment   Palliative Care Outcomes        Patient Active Problem List   Diagnosis Date Noted  . Atrial fibrillation with rapid ventricular response ([place])   . Normocytic anemia 09/23/2021  . AKI (acute kidney injury) ([place]) 09/23/2021  . Palliative care by specialist   . Goals of care, counseling/discussion   . Acute on chronic respiratory failure with hypoxia and hypercapnia (HCC)   . PAF (paroxysmal atrial fibrillation) ([place])   . Microcytic anemia 08/21/2021  . Adenocarcinoma of left lung, stage 3 ([place]) 07/19/2021  . Encounter for antineoplastic chemotherapy 07/19/2021  . Cancer associated pain 07/19/2021  . Pneumonia due to  COVID-19 virus 11/25/2019  . Shortness of breath 11/18/2019  . Chronic respiratory failure with hypoxia ([place]) 11/18/2019  . COPD (chronic obstructive pulmonary disease) ([place]) 01/27/2014  . Hypoxemia 01/27/2014  . Secondary pulmonary arterial hypertension ([employer]) 01/27/2014    Palliative Care Assessment & Plan   Patient Profile: 65 y.o. female  with past medical history of severe COPD, chronic hypoxic respiratory failure on 4-8 L, hypertension, hyperlipidemia with recent diagnosis of stage III adenocarcinoma with large left upper lobe mass and chest wall invasion in August of this year who is currently undergoing chemoradiation admitted on 08/30/2021 with altered mental status with concern for sepsis.  Her mental status over the past several days has remained poor and far from her baseline.  Palliative consulted for goals of care.  Recommendations/Plan: DNR/DNI Goal moving forward is for full comfort. No further BiPAP and plan moving forward is to continue with medications as needed for symptoms as they arise.  Discussed nonverbal signs of distress and plan for symptom management with family and staff at bedside.   Anticipate hospital death  Goals of Care and Additional Recommendations: Limitations on Scope of Treatment: Full Scope Treatment  Code Status:    Code Status Orders  (From admission, onward)           Start     Ordered   09/14/2021 2128  Full code  Continuous        08/30/2021 2127           Code Status History     This patient has a current code status but no historical code status.       Prognosis: Likely hours  Discharge Planning: Anticipate hospital death  Care plan was discussed with patient, family, bedside interdisciplinary care team  Thank you for allowing the Palliative Medicine Team to assist in the care of this patient.   Total Time 30 Prolonged Time Billed No  Greater than 50%  of this time was spent counseling and coordinating care related  to the above assessment  and plan.  Micheline Rough, MD  Please contact Palliative Medicine Team phone at [phone] for questions and concerns.

## 2021-10-02 NOTE — Progress Notes (Signed)
Chaplains provided support to family throughout the day over several visits.  Family is processing and taking turns being with Patricia Sampson.  It is difficult for them to see her when she gets agitated, but they are supporting each other and supporting her.  She was calling out the name of a cousin who has already passed.  There was some comfort taken by the family in hoping maybe she was reuniting with her cousin.  We will continue to check in, but please also page as needs arise.  [place], [place] Pager, [phone] 4:22 PM

## 2021-10-02 NOTE — Progress Notes (Signed)
SLP Cancellation Note  Patient Details Name: Patricia Sampson MRN: [id number] DOB: 05/13/1956   Cancelled treatment:       Reason Eval/Treat Not Completed: Other (comment) (Patient has been made full comfort care and current POC is for discharge home on hospice. Please reorder if change in [place].)  Sonia Baller, MA, CCC-SLP Speech Therapy

## 2021-10-02 NOTE — Progress Notes (Signed)
PROGRESS NOTE    Patricia Sampson  KGM:[id number] DOB: 07/17/1956 DOA: 09/16/2021 PCP: Greig Right, MD  Brief Narrative: Patricia Sampson is a 65 y.o. female with PMH Stage IIIa LUL adenocarcinoma, chronic hypoxic respiratory failure 6L O2 at home, HTN, HLD who presented with AMS. She underwent extensive workup but ultimately an LP was declined. She was treated empirically with abx and acyclovir and BiPAP. Her mentation improved with BiPAP around-the-clock. ABG showed acute respiratory acidosis with CO2 above 80 and severe hypoxia  Assessment & Plan:   Active Problems:   COPD (chronic obstructive pulmonary disease) (HCC)   Adenocarcinoma of left lung, stage 3 (HCC)   PAF (paroxysmal atrial fibrillation) (HCC)   Acute on chronic respiratory failure with hypoxia and hypercapnia (HCC)   Palliative care by specialist   Goals of care, counseling/discussion   Normocytic anemia   AKI (acute kidney injury) ([place])   Atrial fibrillation with rapid ventricular response ([place])  #1 acute on chronic hypoxic hypercapnic respiratory failure-patient with left upper lobe primary bronchogenic carcinoma with increased destruction of the anterior left second rib.  At baseline she is on 6 L of oxygen at home at rest.  With activity she has to go up to 10 L.   ABG with persistent hypercapnia and has been on BiPAP most of the days and nights.  PCCM and palliative care discussed with patient's family and patient and decided on DNR DNI and comfort care.  At this time patient is not much responsive. Expecting hospital death and comfort care at this point.    09/08/2021 CT -  Progressive left upper lobe primary bronchogenic carcinoma with increased destruction of the anterior left second rib. New perilymphatic nodularity in the left upper lobe adjacent to the mass, consistent with lymphangitic carcinomatosis. 3. 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  PATIENT AND FAMILY DECIDED ON COMFORT  CARE.  #2 A. fib RVR patient with history of paroxysmal A. fib. She is now in normal sinus rhythm.    #3 AKI -creatinine 1.28 which may be her new baseline.  #4 acute metabolic encephalopathy multifactorial etiology, hypoxia, hypercapnia  #5 thrombocytopenia platelets around 120 stable.  #6 goals of care patient is DNR/DNI.  With known lung cancer with mets to the ribs and with comorbidities prognosis is very grim.  Expecting hospital death.   Estimated body mass index is 27.8 kg/m as calculated from the following:   Height as of this encounter: _0  (1.753 m).   Weight as of this encounter: 85.4 kg.  DVT prophylaxis: Eliquis  code Status: Full code  family Communication: None  disposition Plan:  Status is: Inpatient  Remains inpatient appropriate because: Hypoxic respiratory failure with hypercapnia    Consultants:  PCCM  Procedures: None  Subjective: Patient resting in bed family all by the side of her bed holding hands and drain   objective: Vitals:   10/01/21 0900 10/01/21 1000 10/01/21 1200 10/02/21 0150  BP: (!) 162/86 (!) 151/60  (!) 95/58  Pulse:  88 94   Resp: _1 Temp:      TempSrc:      SpO2:  96% (!) 89%   Weight:      Height:        Intake/Output Summary (Last 24 hours) at 10/02/2021 1110 Last data filed at 10/02/2021 0300 Gross per 24 hour  Intake --  Output 500 ml  Net -500 ml    Filed Weights   09/16/21 1200 09/16/21 1600  09/18/21 0500  Weight: 75 kg 75 kg 85.4 kg    Examination:  General exam: Appears in no acute distress Respiratory system: Diminished breath sounds at the bases to auscultation. Respiratory effort normal. Cardiovascular system: S1 & S2 heard, RRR. No JVD, murmurs, rubs, gallops or clicks. No pedal edema. Gastrointestinal system: Abdomen is nondistended, soft and nontender. No organomegaly or masses felt. Normal bowel sounds heard. Central nervous system: Unresponsive  extremities: 1+ edema Skin: No rashes,  lesions or ulcers Psychiatry: Unable to assess  Data Reviewed: I have personally reviewed following labs and imaging studies  CBC: Recent Labs  Lab 09/26/21 0358 09/27/21 0521 09/28/21 0357 09/29/21 1423 09/30/21 0422  WBC 5.2 5.3 6.1 7.2 6.0  NEUTROABS 4.2 4.5 5.2  --   --   HGB 8.2* 8.0* 8.0* 7.9* 7.6*  HCT 28.3* 27.5* 28.1* 27.7* 26.5*  MCV 90.1 91.4 92.7 94.2 93.3  PLT 129* 117* 117* 112* 120*    Basic Metabolic Panel: Recent Labs  Lab 09/26/21 0358 09/27/21 0521 09/28/21 0357 09/29/21 1423 09/30/21 0422  NA 137 138 141 137 138  K 4.2 4.2 4.7 4.8 4.5  CL 86* 91* 95* 90* 91*  CO2 43* 44* 42* 42* 41*  GLUCOSE 116* 98 90 252* 84  BUN _0 24* 28*  CREATININE 1.32* 1.50* 1.27* 1.28* 1.28*  CALCIUM 8.0* 8.0* 8.0* 8.0* 8.4*  MG 2.2 2.0 2.0  --  1.8    GFR: Estimated Creatinine Clearance: 52.5 mL/min (A) (by C-G formula based on SCr of 1.28 mg/dL (H)). Liver Function Tests: Recent Labs  Lab 09/30/21 0422  AST 12*  ALT 13  ALKPHOS 50  BILITOT 0.6  PROT 6.0*  ALBUMIN 2.3*    No results for input(s): LIPASE, AMYLASE in the last 168 hours. No results for input(s): AMMONIA in the last 168 hours. Coagulation Profile: No results for input(s): INR, PROTIME in the last 168 hours. Cardiac Enzymes: No results for input(s): CKTOTAL, CKMB, CKMBINDEX, TROPONINI in the last 168 hours. BNP (last 3 results) No results for input(s): PROBNP in the last 8760 hours. HbA1C: No results for input(s): HGBA1C in the last 72 hours. CBG: No results for input(s): GLUCAP in the last 168 hours. Lipid Profile: No results for input(s): CHOL, HDL, LDLCALC, TRIG, CHOLHDL, LDLDIRECT in the last 72 hours. Thyroid Function Tests: No results for input(s): TSH, T4TOTAL, FREET4, T3FREE, THYROIDAB in the last 72 hours. Anemia Panel: No results for input(s): VITAMINB12, FOLATE, FERRITIN, TIBC, IRON, RETICCTPCT in the last 72 hours. Sepsis Labs: No results for input(s): PROCALCITON,  LATICACIDVEN in the last 168 hours.  No results found for this or any previous visit (from the past 240 hour(s)).    Radiology Studies: No results found.   Scheduled Meds:  Chlorhexidine Gluconate Cloth  6 each Topical Daily   mouth rinse  15 mL Mouth Rinse BID   predniSONE  20 mg Oral Q breakfast   PruTect   Topical BID   sodium chloride flush  10-40 mL Intracatheter Q12H   vitamin B-12  1,000 mcg Oral Daily   Continuous Infusions:  sodium chloride 10 mL/hr at 09/22/21 0559   sodium chloride Stopped (09/18/21 0523)   amiodarone Stopped (10/01/21 2000)   sodium chloride       LOS: 21 days    Georgette Shell, MD 10/02/2021, 11:10 AM

## 2021-10-02 NOTE — TOC Progression Note (Signed)
Transition of Care [facility]) - Progression Note    Patient Details  Name: Patricia Sampson MRN: [id number] Date of Birth: [birth year]  [facility] CM/SW Contact  Ross Ludwig, [place] Phone Number: 10/02/2021, 12:40 PM  Clinical Narrative:     CSW was informed by palliative that patient is now on comfort care.  Patient had a Trilogy delivered last week, CSW updated Zach at [facility] about patient's change in circumstances so they can make arrangements to collect the Trilogy.  If anything changes, CSW will update Zach at [place].   Expected Discharge Plan: Home/Self Care Barriers to Discharge: Barriers Resolved  Expected Discharge Plan and Services Expected Discharge Plan: Home/Self Care   Discharge Planning Services: CM Consult   Living arrangements for the past 2 months: Single Family Home                 DME Arranged: Ventilator (TRIOLOGY FOR BIPAP) DME Agency: [employer] Date DME Agency Contacted: 09/26/21   Representative spoke with at DME Agency: [facility]             Social Determinants of Health ([place]) Interventions    Readmission Risk Interventions No flowsheet data found.

## 2021-10-02 NOTE — Progress Notes (Signed)
Physical Therapy Discharge Patient Details Name: [name] MRN: [id number] DOB: [birth year] Today's Date: 10/02/2021 Time:  -     Patient discharged from PT services secondary to medical decline -Patient on comfort care measures.services.  Please see latest therapy progress note for current level of functioning and progress toward goals.    Progress and discharge plan discussed with patient and/or caregiver: NA  GP     Claretha Cooper 10/02/2021, 8:37 AM  [place] Pager [phone] Office [phone]

## 2021-10-03 DIAGNOSIS — R6510 Systemic inflammatory response syndrome (SIRS) of non-infectious origin without acute organ dysfunction: Secondary | ICD-10-CM | POA: Diagnosis not present

## 2021-10-03 NOTE — Progress Notes (Signed)
PMT no charge note.   Chart reviewed, patient seen briefly, noted to be resting comfortably.  Several family members are at the bedside.  Medication history noted.  Appreciate chaplain support.  Anticipate hospital death.  Continue with current comfort measures.  No further palliative specific recommendations at this point. No charge Loistine Chance MD [place] palliative.

## 2021-10-03 NOTE — Progress Notes (Signed)
Chaplain provided ongoing support to [name]'s family as they continue to sit vigil with her.  Spent time speaking with her sister and sister-in-law as they shared memories of her and as they sat with her.  [employer], [place] Pager, [phone] 2:28 PM

## 2021-10-03 NOTE — Progress Notes (Signed)
PROGRESS NOTE    Patricia Sampson  YKZ:[id number] DOB: [birth year] DOA: 09/15/2021 PCP: Greig Right, MD  Brief Narrative: Patricia Sampson is a 65 y.o. female with PMH Stage IIIa LUL adenocarcinoma, chronic hypoxic respiratory failure 6L O2 at home, HTN, HLD who presented with AMS. She underwent extensive workup but ultimately an LP was declined. She was treated empirically with abx and acyclovir and BiPAP. Her mentation improved with BiPAP around-the-clock. ABG showed acute respiratory acidosis with CO2 above 80 and severe hypoxia  Assessment & Plan:   Active Problems:   COPD (chronic obstructive pulmonary disease) (HCC)   Adenocarcinoma of left lung, stage 3 (HCC)   PAF (paroxysmal atrial fibrillation) (HCC)   Acute on chronic respiratory failure with hypoxia and hypercapnia (HCC)   Palliative care by specialist   Goals of care, counseling/discussion   Normocytic anemia   AKI (acute kidney injury) ([place])   Atrial fibrillation with rapid ventricular response ([place])  #1 acute on chronic hypoxic hypercapnic respiratory failure-patient with left upper lobe primary bronchogenic carcinoma with increased destruction of the anterior left second rib.  At baseline she is on 6 L of oxygen at home at rest.  With activity she has to go up to 10 L.   ABG with persistent hypercapnia and has been on BiPAP most of the days and nights.  PCCM and palliative care discussed with patient's family and patient and decided on DNR DNI and comfort care.  At this time patient is not much responsive. Expecting hospital death and comfort care at this point.    09/20/2021 CT -  Progressive left upper lobe primary bronchogenic carcinoma with increased destruction of the anterior left second rib. New perilymphatic nodularity in the left upper lobe adjacent to the mass, consistent with lymphangitic carcinomatosis. 3. 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  PATIENT AND FAMILY DECIDED ON COMFORT  CARE.  #2 A. fib RVR patient with history of paroxysmal A. fib. She is now in normal sinus rhythm.    #3 AKI -creatinine 1.28 which may be her new baseline.  #4 acute metabolic encephalopathy multifactorial etiology, hypoxia, hypercapnia  #5 thrombocytopenia platelets around 120 stable.  #6 goals of care patient is DNR/DNI.  With known lung cancer with mets to the ribs and with comorbidities prognosis is very grim.  Expecting hospital death.   Estimated body mass index is 27.8 kg/m as calculated from the following:   Height as of this encounter: _0  (1.753 m).   Weight as of this encounter: 85.4 kg.  DVT prophylaxis: Eliquis  code Status: Full code  family Communication: None  disposition Plan:  Status is: Inpatient  Remains inpatient appropriate because: Hypoxic respiratory failure with hypercapnia    Consultants:  PCCM  Procedures: None  Subjective: Patient resting in bed family all by the side of her bed holding hands and praying    objective: Vitals:   10/03/21 0400 10/03/21 0500 10/03/21 0600 10/03/21 0800  BP:      Pulse: (!) 102 (!) 58 (!) 101   Resp: _1 Temp:    97.6 F (36.4 C)  TempSrc:    Axillary  SpO2: (!) 89% 92% (!) 88%   Weight:      Height:        Intake/Output Summary (Last 24 hours) at 10/03/2021 1424 Last data filed at 10/03/2021 0600 Gross per 24 hour  Intake 0 ml  Output 900 ml  Net -900 ml    [employer]  09/16/21 1200 09/16/21 1600 09/18/21 0500  Weight: 75 kg 75 kg 85.4 kg    Examination:  General exam: Appears in no acute distress Respiratory system: Diminished breath sounds at the bases to auscultation. Respiratory effort normal. Cardiovascular system: S1 & S2 heard, RRR. No JVD, murmurs, rubs, gallops or clicks. No pedal edema. Gastrointestinal system: Abdomen is nondistended, soft and nontender. No organomegaly or masses felt. Normal bowel sounds heard. Central nervous system: Unresponsive  extremities: 1+  edema Skin: No rashes, lesions or ulcers Psychiatry: Unable to assess  Data Reviewed: I have personally reviewed following labs and imaging studies  CBC: Recent Labs  Lab 09/27/21 0521 09/28/21 0357 09/29/21 1423 09/30/21 0422  WBC 5.3 6.1 7.2 6.0  NEUTROABS 4.5 5.2  --   --   HGB 8.0* 8.0* 7.9* 7.6*  HCT 27.5* 28.1* 27.7* 26.5*  MCV 91.4 92.7 94.2 93.3  PLT 117* 117* 112* 120*    Basic Metabolic Panel: Recent Labs  Lab 09/27/21 0521 09/28/21 0357 09/29/21 1423 09/30/21 0422  NA 138 141 137 138  K 4.2 4.7 4.8 4.5  CL 91* 95* 90* 91*  CO2 44* 42* 42* 41*  GLUCOSE 98 90 252* 84  BUN 17 20 24* 28*  CREATININE 1.50* 1.27* 1.28* 1.28*  CALCIUM 8.0* 8.0* 8.0* 8.4*  MG 2.0 2.0  --  1.8    GFR: Estimated Creatinine Clearance: 52.5 mL/min (A) (by C-G formula based on SCr of 1.28 mg/dL (H)). Liver Function Tests: Recent Labs  Lab 09/30/21 0422  AST 12*  ALT 13  ALKPHOS 50  BILITOT 0.6  PROT 6.0*  ALBUMIN 2.3*    No results for input(s): LIPASE, AMYLASE in the last 168 hours. No results for input(s): AMMONIA in the last 168 hours. Coagulation Profile: No results for input(s): INR, PROTIME in the last 168 hours. Cardiac Enzymes: No results for input(s): CKTOTAL, CKMB, CKMBINDEX, TROPONINI in the last 168 hours. BNP (last 3 results) No results for input(s): PROBNP in the last 8760 hours. HbA1C: No results for input(s): HGBA1C in the last 72 hours. CBG: No results for input(s): GLUCAP in the last 168 hours. Lipid Profile: No results for input(s): CHOL, HDL, LDLCALC, TRIG, CHOLHDL, LDLDIRECT in the last 72 hours. Thyroid Function Tests: No results for input(s): TSH, T4TOTAL, FREET4, T3FREE, THYROIDAB in the last 72 hours. Anemia Panel: No results for input(s): VITAMINB12, FOLATE, FERRITIN, TIBC, IRON, RETICCTPCT in the last 72 hours. Sepsis Labs: No results for input(s): PROCALCITON, LATICACIDVEN in the last 168 hours.  No results found for this or any  previous visit (from the past 240 hour(s)).    Radiology Studies: No results found.   Scheduled Meds:  Chlorhexidine Gluconate Cloth  6 each Topical Daily   mouth rinse  15 mL Mouth Rinse BID   predniSONE  20 mg Oral Q breakfast   PruTect   Topical BID   sodium chloride flush  10-40 mL Intracatheter Q12H   vitamin B-12  1,000 mcg Oral Daily   Continuous Infusions:  sodium chloride 10 mL/hr at 09/22/21 0559   sodium chloride Stopped (09/18/21 0523)   amiodarone Stopped (10/01/21 2000)   sodium chloride       LOS: 22 days    Georgette Shell, MD 10/03/2021, 2:24 PM

## 2021-10-04 DIAGNOSIS — R6510 Systemic inflammatory response syndrome (SIRS) of non-infectious origin without acute organ dysfunction: Secondary | ICD-10-CM | POA: Diagnosis not present

## 2021-10-04 DIAGNOSIS — J9621 Acute and chronic respiratory failure with hypoxia: Secondary | ICD-10-CM | POA: Diagnosis not present

## 2021-10-04 DIAGNOSIS — G9341 Metabolic encephalopathy: Secondary | ICD-10-CM | POA: Diagnosis not present

## 2021-10-04 DIAGNOSIS — Z515 Encounter for palliative care: Secondary | ICD-10-CM

## 2021-10-04 DIAGNOSIS — N179 Acute kidney failure, unspecified: Secondary | ICD-10-CM | POA: Diagnosis not present

## 2021-10-04 DIAGNOSIS — I4891 Unspecified atrial fibrillation: Secondary | ICD-10-CM

## 2021-10-04 NOTE — Progress Notes (Signed)
Daily Progress Note   Patient Name: Patricia Sampson       Date: 10/04/2021 DOB: [birth year]  Age: 65 y.o. MRN#: [id number] Attending Physician: Mariel Aloe, MD Primary Care Physician: Greig Right, MD Admit Date: 09/25/2021  Reason for Consultation/Follow-up: Establishing goals of care  Subjective:  Patricia Sampson is resting in bed, has her eyes open today.      Multiple family members surrounding her at the bedside.    Length of Stay: 23  Current Medications: Scheduled Meds:  . Chlorhexidine Gluconate Cloth  6 each Topical Daily  . mouth rinse  15 mL Mouth Rinse BID  . predniSONE  20 mg Oral Q breakfast  . PruTect   Topical BID  . sodium chloride flush  10-40 mL Intracatheter Q12H  . vitamin B-12  1,000 mcg Oral Daily    Continuous Infusions: . sodium chloride 10 mL/hr at 09/22/21 0559  . sodium chloride Stopped (09/18/21 0523)  . amiodarone Stopped (10/01/21 1601)  . sodium chloride      PRN Meds: sodium chloride, acetaminophen **OR** acetaminophen, ammonium lactate, fentaNYL (SUBLIMAZE) injection, glycopyrrolate, haloperidol lactate, ipratropium-albuterol, lip balm, LORazepam, magic mouthwash, morphine injection, morphine injection, ondansetron **OR** ondansetron (ZOFRAN) IV, oxyCODONE, sodium chloride, sodium chloride flush  Physical Exam         General: awake today.  HEENT:  on O2 [place].  Appears with generalized weakness No distress.     Vital Signs: BP 100/67   Pulse 97   Temp 97.6 F (36.4 C) (Axillary)   Resp 13   Ht _0  (1.753 m)   Wt 85.4 kg   SpO2 92%   BMI 27.80 kg/m  SpO2: SpO2: 92 % O2 Device: O2 Device: High Flow Nasal Cannula O2 Flow Rate: O2 Flow Rate (L/min): 15 L/min  Intake/output summary:  Intake/Output Summary (Last 24 hours) at  10/04/2021 1647 Last data filed at 10/04/2021 0851 Gross per 24 hour  Intake 120 ml  Output 200 ml  Net -80 ml    LBM: Last BM Date: 10/01/21 Baseline Weight: Weight: 75 kg Most recent weight: Weight: 85.4 kg       Palliative Assessment/Data:    Flowsheet Rows    Flowsheet Row Most Recent Value  Intake Tab   Referral Department Hospitalist  Unit at Time of Referral ICU  Palliative Care Primary Diagnosis Sepsis/Infectious Disease  Date Notified 09/15/21  Palliative Care Type New Palliative care  Reason for referral Clarify Goals of Care  Date of Admission 09/08/2021  Date first seen by Palliative Care 09/16/21  # of days Palliative referral response time 1 Day(s)  # of days IP prior to Palliative referral 4  Clinical Assessment   Palliative Performance Scale Score 40%  Psychosocial & Spiritual Assessment   Palliative Care Outcomes        Patient Active Problem List   Diagnosis Date Noted  . Atrial fibrillation with rapid ventricular response ([place])   . Normocytic anemia 09/23/2021  . AKI (acute kidney injury) ([place]) 09/23/2021  . Palliative care by specialist   . Goals of care, counseling/discussion   . Acute on chronic respiratory failure with hypoxia and hypercapnia (HCC)   . PAF (paroxysmal atrial fibrillation) ([place])   . Microcytic anemia 08/21/2021  . Adenocarcinoma of left lung, stage 3 ([place]) 07/19/2021  . Encounter for antineoplastic chemotherapy 07/19/2021  . Cancer associated pain 07/19/2021  . Pneumonia due to COVID-19 virus 11/25/2019  . Shortness of breath 11/18/2019  . Chronic respiratory failure with hypoxia ([place]) 11/18/2019  . COPD (chronic obstructive pulmonary disease) ([place]) 01/27/2014  . Hypoxemia 01/27/2014  . Secondary pulmonary arterial hypertension ([place]) 01/27/2014    Palliative Care Assessment & Plan   Patient Profile: 65 y.o. female  with past medical history of severe COPD, chronic hypoxic respiratory failure on 4-8 L, hypertension,  hyperlipidemia with recent diagnosis of stage III adenocarcinoma with large left upper lobe mass and chest wall invasion in August of this year who is currently undergoing chemoradiation admitted on 09/20/2021 with altered mental status with concern for sepsis.  Her mental status over the past several days has remained poor and far from her baseline.  Palliative consulted for goals of care.  Recommendations/Plan: DNR/DNI Discussed with family at bedside, no distressing unmanaged symptoms at this time, continue current mode of care.   Anticipate hospital death  Goals of Care and Additional Recommendations: Limitations on Scope of Treatment: Full Scope Treatment  Code Status: is now listed as DNR.     Code Status Orders  (From admission, onward)           Start     Ordered   09/02/2021 2128  Full code  Continuous        08/27/2021 2127           Code Status History     This patient has a current code status but no historical code status.       Prognosis: Likely hours to days.   Discharge Planning: Anticipate hospital death  Care plan was discussed with patient, family, bedside interdisciplinary care team  Thank you for allowing the Palliative Medicine Team to assist in the care of this patient.   Total Time 25 Prolonged Time Billed No  Greater than 50%  of this time was spent counseling and coordinating care related to the above assessment and plan.  Patricia Chance, MD  Please contact Palliative Medicine Team phone at [phone] for questions and concerns.

## 2021-10-04 NOTE — Progress Notes (Signed)
PROGRESS NOTE    Patricia Sampson  ZTA:[id number] DOB: Mar 23, 1956 DOA: 09/01/2021 PCP: Greig Right, MD   Brief Narrative: Patricia Sampson is a 65 y.o. female with PMH Stage IIIa LUL adenocarcinoma, chronic hypoxic respiratory failure 6L O2 at home, HTN, HLD who presented with AMS. She underwent extensive workup but ultimately an LP was declined. She was treated empirically with abx and acyclovir for encephalitis and her mentation improved. Further into her hospitalization, she developed worsened respiratory failure needing BiPAP therapy and subsequently unstable atrial fibrillation requiring initiation of amiodarone IV and worsening respiratory acidosis. Patient required frequent BiPAP throughout the day to allow her to stay alert. Goals of care discussions led to transition to comfort measures.   Assessment & Plan:   * SIRS (systemic inflammatory response syndrome) (HCC)-resolved as of 09/22/2021 No clear source ever presented; evaluated by ID as well; LP not pursued per family wish. Empirically treated with Ceftriaxone and Acyclovir. Completed course.  PAF (paroxysmal atrial fibrillation) ([place]) Likely related to SIRS/hospitalization. Cardiology consulted. Patient managed on amiodarone and heparin IV. Heparin IV transitioned to Eliquis. Now comfort measures.  Atrial fibrillation with rapid ventricular response ([place]) As mentioned above.  AKI (acute kidney injury) ([place]) Baseline creatinine of 0.7. Peak of 1.50. Concern this was multifactorial in setting of hypotension, acyclovir, poor oral intake. Overall, function remained stable and mildly elevated even with IV fluids.  Normocytic anemia Multifactorial etiology from underlying chronic illness, malignancy. Patient required 2 units of PRBC.  Acute on chronic respiratory failure with hypoxia and hypercapnia (HCC) Patient's baseline is 6 L/min oxygen via [place]. Patient weaned to home oxygen dose at rest, but still required increased  oxygen rate with activity. Now comfort measures.  Adenocarcinoma of left lung, stage 3 (HCC) Stage IIIA (T4, N1, M0) non-small cell lung cancer, adenocarcinoma presented with large left upper lobe lung mass with chest wall invasion in addition to left suprahilar lymphadenopathy diagnosed in August 2022  Follows with Dr. Lisbeth Renshaw and Dr. Julien Nordmann. Patient transitioned to full comfort measures during this hospitalization.  COPD (chronic obstructive pulmonary disease) ([place]) Patient treated with oxygen therapy, steroid taper and Duoneb nebulizer treatments. BiPAP used overnight.  Acute metabolic encephalopathy-resolved as of 09/20/2021 Initially resolved. Now with encephalopathy related to hypercapnia and current comfort measures status.     DVT prophylaxis: Comfort measures Code Status:   Code Status: DNR Family Communication: Husband at bedside Disposition Plan: Anticipate hospital death   Consultants:  PCCM Cardiology Neurology Infectious disease  Procedures:    Antimicrobials: Vancomycin Cefepime Ceftriaxone Flagyl Acyclovir Ampicillin    Subjective: Patient is non-verbal.  Objective: Vitals:   10/03/21 2000 10/03/21 2100 10/03/21 2200 10/03/21 2300  BP:      Pulse: 100 99 96 (!) 112  Resp: _0 Temp:      TempSrc:      SpO2: (!) 83% (!) 87% 93% (!) 86%  Weight:      Height:        Intake/Output Summary (Last 24 hours) at 10/04/2021 0808 Last data filed at 10/04/2021 0600 Gross per 24 hour  Intake 0 ml  Output 200 ml  Net -200 ml   Filed Weights   09/16/21 1200 09/16/21 1600 09/18/21 0500  Weight: 75 kg 75 kg 85.4 kg    Examination:  General exam: Appears calm and comfortable Respiratory system: Clear to auscultation. Respiratory effort normal. Cardiovascular system: S1 & S2 heard Gastrointestinal system: Abdomen is nondistended, soft and nontender. No organomegaly or masses  felt. Normal bowel sounds heard. Central nervous system: Alert but  does not interact. Not oriented. Musculoskeletal: No edema. No calf tenderness Skin: No cyanosis. No rashes    Data Reviewed: I have personally reviewed following labs and imaging studies  CBC Lab Results  Component Value Date   WBC 6.0 09/30/2021   RBC 2.84 (L) 09/30/2021   HGB 7.6 (L) 09/30/2021   HCT 26.5 (L) 09/30/2021   MCV 93.3 09/30/2021   MCH 26.8 09/30/2021   PLT 120 (L) 09/30/2021   MCHC 28.7 (L) 09/30/2021   RDW 19.8 (H) 09/30/2021   LYMPHSABS 0.2 (L) 09/28/2021   MONOABS 0.6 09/28/2021   EOSABS 0.0 09/28/2021   BASOSABS 0.0 26/20/3559     Last metabolic panel Lab Results  Component Value Date   NA 138 09/30/2021   K 4.5 09/30/2021   CL 91 (L) 09/30/2021   CO2 41 (H) 09/30/2021   BUN 28 (H) 09/30/2021   CREATININE 1.28 (H) 09/30/2021   GLUCOSE 84 09/30/2021   GFRNONAA 47 (L) 09/30/2021   CALCIUM 8.4 (L) 09/30/2021   PHOS 2.5 09/18/2021   PROT 6.0 (L) 09/30/2021   ALBUMIN 2.3 (L) 09/30/2021   BILITOT 0.6 09/30/2021   ALKPHOS 50 09/30/2021   AST 12 (L) 09/30/2021   ALT 13 09/30/2021   ANIONGAP 6 09/30/2021    CBG (last 3)  No results for input(s): GLUCAP in the last 72 hours.   GFR: Estimated Creatinine Clearance: 52.5 mL/min (A) (by C-G formula based on SCr of 1.28 mg/dL (H)).  Coagulation Profile: No results for input(s): INR, PROTIME in the last 168 hours.  No results found for this or any previous visit (from the past 240 hour(s)).      Radiology Studies: No results found.      Scheduled Meds:  Chlorhexidine Gluconate Cloth  6 each Topical Daily   mouth rinse  15 mL Mouth Rinse BID   predniSONE  20 mg Oral Q breakfast   PruTect   Topical BID   sodium chloride flush  10-40 mL Intracatheter Q12H   vitamin B-12  1,000 mcg Oral Daily   Continuous Infusions:  sodium chloride 10 mL/hr at 09/22/21 0559   sodium chloride Stopped (09/18/21 0523)   amiodarone Stopped (10/01/21 2000)   sodium chloride       LOS: 23 days      Cordelia Poche, MD [facility] 10/04/2021, 8:08 AM  If 7PM-7AM, please contact night-coverage [email or web address]

## 2021-10-04 NOTE — Progress Notes (Signed)
Chaplain checked  in with family several times throughout the day.  They did not have any needs at the moment and are just spending time together as a family.  [place], Bcc PAger, [phone] 5:42 PM

## 2021-10-04 NOTE — Assessment & Plan Note (Signed)
As mentioned above.

## 2021-10-05 DIAGNOSIS — Z7189 Other specified counseling: Secondary | ICD-10-CM | POA: Diagnosis not present

## 2021-10-05 DIAGNOSIS — Z515 Encounter for palliative care: Secondary | ICD-10-CM | POA: Diagnosis not present

## 2021-10-05 DIAGNOSIS — G9341 Metabolic encephalopathy: Secondary | ICD-10-CM | POA: Diagnosis not present

## 2021-10-05 DIAGNOSIS — N179 Acute kidney failure, unspecified: Secondary | ICD-10-CM | POA: Diagnosis not present

## 2021-10-05 DIAGNOSIS — J9621 Acute and chronic respiratory failure with hypoxia: Secondary | ICD-10-CM | POA: Diagnosis not present

## 2021-10-05 DIAGNOSIS — R6510 Systemic inflammatory response syndrome (SIRS) of non-infectious origin without acute organ dysfunction: Secondary | ICD-10-CM | POA: Diagnosis not present

## 2021-10-05 NOTE — Progress Notes (Signed)
Chaplain engaged in a follow-up visit with Noralee's sisters.  They shared about the progress she has  been making and the ways she has continued to get better.  They are still hopeful of Letica recovering.  Anorah was able to motion hello to Chaplain when Chaplain spoke to her.  Lira continues to be surrounded by family and a lot of support.  Chaplain will continue to follow-up.     10/05/21 1100  Clinical Encounter Type  Visited With Patient and family together  Visit Type Follow-up

## 2021-10-05 NOTE — Progress Notes (Signed)
Daily Progress Note   Patient Name: Patricia Sampson       Date: 10/05/2021 DOB: 07-31-1956  Age: 65 y.o. MRN#: [id number] Attending Physician: Mariel Aloe, MD Primary Care Physician: Greig Right, MD Admit Date: 09/06/2021  Reason for Consultation/Follow-up: Establishing goals of care  Subjective:  Patricia Sampson is resting in bed, has her eyes open today. She responds some, she had chest pain earlier today, better with IV Morphine.      Multiple family members surrounding her at the bedside.    Spiritual care assisting as well.  Escalating opioid needs in the past 24 hours.   Length of Stay: 24  Current Medications: Scheduled Meds:  . Chlorhexidine Gluconate Cloth  6 each Topical Daily  . mouth rinse  15 mL Mouth Rinse BID  . predniSONE  20 mg Oral Q breakfast  . PruTect   Topical BID  . sodium chloride flush  10-40 mL Intracatheter Q12H  . vitamin B-12  1,000 mcg Oral Daily    Continuous Infusions: . sodium chloride 10 mL/hr at 09/22/21 0559  . sodium chloride Stopped (09/18/21 0523)    PRN Meds: sodium chloride, acetaminophen **OR** acetaminophen, ammonium lactate, glycopyrrolate, haloperidol lactate, ipratropium-albuterol, lip balm, LORazepam, magic mouthwash, morphine injection, ondansetron **OR** ondansetron (ZOFRAN) IV, oxyCODONE, sodium chloride flush  Physical Exam         General: awake and responds some.  HEENT:  on O2 [place].  Appears with generalized weakness No distress.     Vital Signs: BP 100/67   Pulse 92   Temp 97.7 F (36.5 C) (Axillary)   Resp 11   Ht _0  (1.753 m)   Wt 85.4 kg   SpO2 (!) 74%   BMI 27.80 kg/m  SpO2: SpO2: (!) 74 % O2 Device: O2 Device: High Flow Nasal Cannula O2 Flow Rate: O2 Flow Rate (L/min): 10 L/min  Intake/output  summary:  Intake/Output Summary (Last 24 hours) at 10/05/2021 1115 Last data filed at 10/04/2021 2000 Gross per 24 hour  Intake --  Output 450 ml  Net -450 ml    LBM: Last BM Date: 10/01/21 Baseline Weight: Weight: 75 kg Most recent weight: Weight: 85.4 kg       Palliative Assessment/Data:    Flowsheet Rows    Flowsheet Row Most Recent Value  Intake Tab  Referral Department Hospitalist  Unit at Time of Referral ICU  Palliative Care Primary Diagnosis Sepsis/Infectious Disease  Date Notified 09/15/21  Palliative Care Type New Palliative care  Reason for referral Clarify Goals of Care  Date of Admission 08/29/2021  Date first seen by Palliative Care 09/16/21  # of days Palliative referral response time 1 Day(s)  # of days IP prior to Palliative referral 4  Clinical Assessment   Palliative Performance Scale Score 40%  Psychosocial & Spiritual Assessment   Palliative Care Outcomes        Patient Active Problem List   Diagnosis Date Noted  . Comfort measures only status 10/04/2021  . Atrial fibrillation with rapid ventricular response ([place])   . Normocytic anemia 09/23/2021  . AKI (acute kidney injury) ([place]) 09/23/2021  . Palliative care by specialist   . Goals of care, counseling/discussion   . Acute on chronic respiratory failure with hypoxia and hypercapnia (HCC)   . PAF (paroxysmal atrial fibrillation) ([place])   . Acute metabolic encephalopathy   . Microcytic anemia 08/21/2021  . Adenocarcinoma of left lung, stage 3 ([place]) 07/19/2021  . Encounter for antineoplastic chemotherapy 07/19/2021  . Cancer associated pain 07/19/2021  . Pneumonia due to COVID-19 virus 11/25/2019  . Shortness of breath 11/18/2019  . Chronic respiratory failure with hypoxia ([place]) 11/18/2019  . COPD (chronic obstructive pulmonary disease) ([place]) 01/27/2014  . Hypoxemia 01/27/2014  . Secondary pulmonary arterial hypertension ([place]) 01/27/2014    Palliative Care Assessment & Plan   Patient  Profile: 65 y.o. female  with past medical history of severe COPD, chronic hypoxic respiratory failure on 4-8 L, hypertension, hyperlipidemia with recent diagnosis of stage III adenocarcinoma with large left upper lobe mass and chest wall invasion in August of this year who is currently undergoing chemoradiation admitted on 09/15/2021 with altered mental status with concern for sepsis.  Her mental status over the past several days has remained poor and far from her baseline.  Palliative consulted for goals of care.  Recommendations/Plan: DNR/DNI Discussed with family at bedside, continue IV Morphine PRN and monitor.   Anticipate hospital death as per Dr Kirstie Mirza discussions with the patient and her family, patient with high risk for ongoing decline decompensation and death.   Goals of Care and Additional Recommendations: Limitations on Scope of Treatment: comfort measures.   Code Status: is now listed as DNR.     Code Status Orders  (From admission, onward)           Start     Ordered   08/29/2021 2128  Full code  Continuous        08/31/2021 2127           Code Status History     This patient has a current code status but no historical code status.       Prognosis: Likely  hours to days.   Discharge Planning: Anticipate hospital death  Care plan was discussed with patient, family, bedside interdisciplinary care team  Thank you for allowing the Palliative Medicine Team to assist in the care of this patient.   Total Time 25 Prolonged Time Billed No  Greater than 50%  of this time was spent counseling and coordinating care related to the above assessment and plan.  Loistine Chance, MD  Please contact Palliative Medicine Team phone at [phone] for questions and concerns.

## 2021-10-05 NOTE — Progress Notes (Signed)
PROGRESS NOTE    Patricia Sampson  EZM:[id number] DOB: 1956/11/06 DOA: 09/07/2021 PCP: Greig Right, MD   Brief Narrative: Patricia Sampson is a 65 y.o. female with PMH Stage IIIa LUL adenocarcinoma, chronic hypoxic respiratory failure 6L O2 at home, HTN, HLD who presented with AMS. She underwent extensive workup but ultimately an LP was declined. She was treated empirically with abx and acyclovir for encephalitis and her mentation improved. Further into her hospitalization, she developed worsened respiratory failure needing BiPAP therapy and subsequently unstable atrial fibrillation requiring initiation of amiodarone IV and worsening respiratory acidosis. Patient required frequent BiPAP throughout the day to allow her to stay alert. Goals of care discussions led to transition to comfort measures.   Assessment & Plan:   * SIRS (systemic inflammatory response syndrome) (HCC)-resolved as of 09/22/2021 No clear source ever presented; evaluated by ID as well; LP not pursued per family wish. Empirically treated with Ceftriaxone and Acyclovir. Completed course.  PAF (paroxysmal atrial fibrillation) ([place]) Likely related to SIRS/hospitalization. Cardiology consulted. Patient managed on amiodarone and heparin IV. Heparin IV transitioned to Eliquis. Now comfort measures.  Atrial fibrillation with rapid ventricular response ([place]) As mentioned above.  AKI (acute kidney injury) ([place]) Baseline creatinine of 0.7. Peak of 1.50. Concern this was multifactorial in setting of hypotension, acyclovir, poor oral intake. Overall, function remained stable and mildly elevated even with IV fluids.  Normocytic anemia Multifactorial etiology from underlying chronic illness, malignancy. Patient required 2 units of PRBC.  Acute on chronic respiratory failure with hypoxia and hypercapnia (HCC) Patient's baseline is 6 L/min oxygen via [place]. Patient weaned to home oxygen dose at rest, but still required increased  oxygen rate with activity. Now comfort measures.  Acute metabolic encephalopathy Initially resolved. Now with encephalopathy related to hypercapnia and current comfort measures status.  Adenocarcinoma of left lung, stage 3 (HCC) Follows with Dr. Lisbeth Renshaw and Dr. Julien Nordmann. Patient transitioned to full comfort measures during this hospitalization.  COPD (chronic obstructive pulmonary disease) ([place]) Patient treated with oxygen therapy, steroid taper and Duoneb nebulizer treatments. BiPAP used overnight.     DVT prophylaxis: Comfort measures Code Status:   Code Status: DNR Family Communication: None at bedside Disposition Plan: Anticipate hospital death   Consultants:  PCCM Cardiology Neurology Infectious disease  Procedures:    Antimicrobials: Vancomycin Cefepime Ceftriaxone Flagyl Acyclovir Ampicillin    Subjective: Pain of right chest. No other concerns. Answers my questions with head nods  Objective: Vitals:   10/05/21 0200 10/05/21 0300 10/05/21 0400 10/05/21 0800  BP:      Pulse: 98 94 92   Resp: _0 Temp:      TempSrc:      SpO2: (!) 85% 90% 96% (!) 74%  Weight:      Height:        Intake/Output Summary (Last 24 hours) at 10/05/2021 1404 Last data filed at 10/04/2021 2000 Gross per 24 hour  Intake --  Output 450 ml  Net -450 ml    Filed Weights   09/16/21 1200 09/16/21 1600 09/18/21 0500  Weight: 75 kg 75 kg 85.4 kg    Examination:  General exam: Appears calm and comfortable Respiratory system: Clear to auscultation. Mild tachypnea with increased mildly increased accessory muscle usage Cardiovascular system: S1 & S2 heard, RRR. Central nervous system: Alert    Data Reviewed: I have personally reviewed following labs and imaging studies  CBC Lab Results  Component Value Date   WBC 6.0 09/30/2021  RBC 2.84 (L) 09/30/2021   HGB 7.6 (L) 09/30/2021   HCT 26.5 (L) 09/30/2021   MCV 93.3 09/30/2021   MCH 26.8 09/30/2021   PLT 120  (L) 09/30/2021   MCHC 28.7 (L) 09/30/2021   RDW 19.8 (H) 09/30/2021   LYMPHSABS 0.2 (L) 09/28/2021   MONOABS 0.6 09/28/2021   EOSABS 0.0 09/28/2021   BASOSABS 0.0 95/05/2256     Last metabolic panel Lab Results  Component Value Date   NA 138 09/30/2021   K 4.5 09/30/2021   CL 91 (L) 09/30/2021   CO2 41 (H) 09/30/2021   BUN 28 (H) 09/30/2021   CREATININE 1.28 (H) 09/30/2021   GLUCOSE 84 09/30/2021   GFRNONAA 47 (L) 09/30/2021   CALCIUM 8.4 (L) 09/30/2021   PHOS 2.5 09/18/2021   PROT 6.0 (L) 09/30/2021   ALBUMIN 2.3 (L) 09/30/2021   BILITOT 0.6 09/30/2021   ALKPHOS 50 09/30/2021   AST 12 (L) 09/30/2021   ALT 13 09/30/2021   ANIONGAP 6 09/30/2021   GFR: Estimated Creatinine Clearance: 52.5 mL/min (A) (by C-G formula based on SCr of 1.28 mg/dL (H)).   Radiology Studies: No results found.  Scheduled Meds:  Chlorhexidine Gluconate Cloth  6 each Topical Daily   mouth rinse  15 mL Mouth Rinse BID   predniSONE  20 mg Oral Q breakfast   PruTect   Topical BID   sodium chloride flush  10-40 mL Intracatheter Q12H   vitamin B-12  1,000 mcg Oral Daily   Continuous Infusions:  sodium chloride 10 mL/hr at 09/22/21 0559   sodium chloride Stopped (09/18/21 0523)     LOS: 24 days     Cordelia Poche, MD [facility] 10/05/2021, 2:04 PM  If 7PM-7AM, please contact night-coverage [email or web address]

## 2021-10-06 DIAGNOSIS — R6510 Systemic inflammatory response syndrome (SIRS) of non-infectious origin without acute organ dysfunction: Secondary | ICD-10-CM | POA: Diagnosis not present

## 2021-10-06 NOTE — Progress Notes (Signed)
PMT no charge note.    Chart reviewed, patient seen briefly, noted to be resting comfortably.  Family members at the bedside.  Discussed with [facility] MD, discussed with bedside nursing.  Medication history reviewed.  Patient has escalating opioid needs, anticipated to have markedly limited prognosis and with previous conversations comfort measures was chosen.  Continue current mode of care no additional palliative specific recommendations at this point. No charge Loistine Chance MD [place] palliative.

## 2021-10-06 NOTE — Progress Notes (Signed)
PROGRESS NOTE    [name]  WVP:[id number] DOB: Dec 02, 1955 DOA: 09/12/2021 PCP: Greig Right, MD   Brief Narrative: Patricia Sampson is a 65 y.o. female with PMH Stage IIIa LUL adenocarcinoma, chronic hypoxic respiratory failure 6L O2 at home, HTN, HLD who presented with AMS. She underwent extensive workup but ultimately an LP was declined. She was treated empirically with abx and acyclovir for encephalitis and her mentation improved. Further into her hospitalization, she developed worsened respiratory failure needing BiPAP therapy and subsequently unstable atrial fibrillation requiring initiation of amiodarone IV and worsening respiratory acidosis. Patient required frequent BiPAP throughout the day to allow her to stay alert. Goals of care discussions led to transition to comfort measures.   Assessment & Plan:   * SIRS (systemic inflammatory response syndrome) (HCC)-resolved as of 09/22/2021 No clear source ever presented; evaluated by ID as well; LP not pursued per family wish. Empirically treated with Ceftriaxone and Acyclovir. Completed course.  PAF (paroxysmal atrial fibrillation) ([place]) Likely related to SIRS/hospitalization. Cardiology consulted. Patient managed on amiodarone and heparin IV. Heparin IV transitioned to Eliquis. Now comfort measures.  Atrial fibrillation with rapid ventricular response ([place]) As mentioned above.  AKI (acute kidney injury) ([place]) Baseline creatinine of 0.7. Peak of 1.50. Concern this was multifactorial in setting of hypotension, acyclovir, poor oral intake. Overall, function remained stable and mildly elevated even with IV fluids.  Normocytic anemia Multifactorial etiology from underlying chronic illness, malignancy. Patient required 2 units of PRBC.  Acute on chronic respiratory failure with hypoxia and hypercapnia (HCC) Patient's baseline is 6 L/min oxygen via [place]. Patient weaned to home oxygen dose at rest, but still required increased  oxygen rate with activity. Now comfort measures.  Acute metabolic encephalopathy Initially resolved. Now with encephalopathy related to hypercapnia and current comfort measures status.  Adenocarcinoma of left lung, stage 3 (HCC) Follows with Dr. Lisbeth Renshaw and Dr. Julien Nordmann. Patient transitioned to full comfort measures during this hospitalization.  COPD (chronic obstructive pulmonary disease) ([place]) Patient treated with oxygen therapy, steroid taper and Duoneb nebulizer treatments. BiPAP used overnight.     DVT prophylaxis: Comfort measures Code Status:   Code Status: DNR Family Communication: Husband at bedside Disposition Plan: Anticipate hospital death   Consultants:  PCCM Cardiology Neurology Infectious disease  Procedures:    Antimicrobials: Vancomycin Cefepime Ceftriaxone Flagyl Acyclovir Ampicillin    Subjective: No issues noted overnight.  Objective: Vitals:   10/05/21 1941 10/05/21 2000 10/05/21 2312 10/06/21 0400  BP:      Pulse:  97  95  Resp:  14  10  Temp:   (!) 97.5 F (36.4 C)   TempSrc:   Axillary   SpO2: (!) 84% (!) 85%  (!) 87%  Weight:      Height:        Intake/Output Summary (Last 24 hours) at 10/06/2021 0901 Last data filed at 10/05/2021 1941 Gross per 24 hour  Intake 30 ml  Output --  Net 30 ml    Filed Weights   09/16/21 1200 09/16/21 1600 09/18/21 0500  Weight: 75 kg 75 kg 85.4 kg    Examination:  General: No distress Respiratory: Slightly labored work of breathing.   Data Reviewed: I have personally reviewed following labs and imaging studies  CBC Lab Results  Component Value Date   WBC 6.0 09/30/2021   RBC 2.84 (L) 09/30/2021   HGB 7.6 (L) 09/30/2021   HCT 26.5 (L) 09/30/2021   MCV 93.3 09/30/2021   MCH 26.8 09/30/2021  PLT 120 (L) 09/30/2021   MCHC 28.7 (L) 09/30/2021   RDW 19.8 (H) 09/30/2021   LYMPHSABS 0.2 (L) 09/28/2021   MONOABS 0.6 09/28/2021   EOSABS 0.0 09/28/2021   BASOSABS 0.0 09/28/2021      Last metabolic panel Lab Results  Component Value Date   NA 138 09/30/2021   K 4.5 09/30/2021   CL 91 (L) 09/30/2021   CO2 41 (H) 09/30/2021   BUN 28 (H) 09/30/2021   CREATININE 1.28 (H) 09/30/2021   GLUCOSE 84 09/30/2021   GFRNONAA 47 (L) 09/30/2021   CALCIUM 8.4 (L) 09/30/2021   PHOS 2.5 09/18/2021   PROT 6.0 (L) 09/30/2021   ALBUMIN 2.3 (L) 09/30/2021   BILITOT 0.6 09/30/2021   ALKPHOS 50 09/30/2021   AST 12 (L) 09/30/2021   ALT 13 09/30/2021   ANIONGAP 6 09/30/2021   GFR: Estimated Creatinine Clearance: 52.5 mL/min (A) (by C-G formula based on SCr of 1.28 mg/dL (H)).   Radiology Studies: No results found.  Scheduled Meds:  Chlorhexidine Gluconate Cloth  6 each Topical Daily   mouth rinse  15 mL Mouth Rinse BID   predniSONE  20 mg Oral Q breakfast   PruTect   Topical BID   sodium chloride flush  10-40 mL Intracatheter Q12H   vitamin B-12  1,000 mcg Oral Daily   Continuous Infusions:  sodium chloride 10 mL/hr at 09/22/21 0559   sodium chloride Stopped (09/18/21 0523)     LOS: 25 days     Cordelia Poche, MD [facility] 10/06/2021, 9:01 AM  If 7PM-7AM, please contact night-coverage [email or web address]

## 2021-10-07 DIAGNOSIS — Z515 Encounter for palliative care: Secondary | ICD-10-CM | POA: Diagnosis not present

## 2021-10-07 DIAGNOSIS — G9341 Metabolic encephalopathy: Secondary | ICD-10-CM | POA: Diagnosis not present

## 2021-10-07 DIAGNOSIS — J9621 Acute and chronic respiratory failure with hypoxia: Secondary | ICD-10-CM | POA: Diagnosis not present

## 2021-10-07 DIAGNOSIS — R6510 Systemic inflammatory response syndrome (SIRS) of non-infectious origin without acute organ dysfunction: Secondary | ICD-10-CM | POA: Diagnosis not present

## 2021-10-07 DIAGNOSIS — N179 Acute kidney failure, unspecified: Secondary | ICD-10-CM | POA: Diagnosis not present

## 2021-10-07 DIAGNOSIS — Z7189 Other specified counseling: Secondary | ICD-10-CM | POA: Diagnosis not present

## 2021-10-07 NOTE — Progress Notes (Signed)
PROGRESS NOTE    Patricia Sampson  LTE:[id number] DOB: [birth year] DOA: 09/07/2021 PCP: Greig Right, MD   Brief Narrative: Patricia Sampson is a 65 y.o. female with PMH Stage IIIa LUL adenocarcinoma, chronic hypoxic respiratory failure 6L O2 at home, HTN, HLD who presented with AMS. She underwent extensive workup but ultimately an LP was declined. She was treated empirically with abx and acyclovir for encephalitis and her mentation improved. Further into her hospitalization, she developed worsened respiratory failure needing BiPAP therapy and subsequently unstable atrial fibrillation requiring initiation of amiodarone IV and worsening respiratory acidosis. Patient required frequent BiPAP throughout the day to allow her to stay alert. Goals of care discussions led to transition to comfort measures.   Assessment & Plan:   * SIRS (systemic inflammatory response syndrome) (HCC)-resolved as of 09/22/2021 No clear source ever presented; evaluated by ID as well; LP not pursued per family wish. Empirically treated with Ceftriaxone and Acyclovir. Completed course.  PAF (paroxysmal atrial fibrillation) ([place]) Likely related to SIRS/hospitalization. Cardiology consulted. Patient managed on amiodarone and heparin IV. Heparin IV transitioned to Eliquis. Now comfort measures.  Atrial fibrillation with rapid ventricular response ([place]) As mentioned above.  AKI (acute kidney injury) ([place]) Baseline creatinine of 0.7. Peak of 1.50. Concern this was multifactorial in setting of hypotension, acyclovir, poor oral intake. Overall, function remained stable and mildly elevated even with IV fluids.  Normocytic anemia Multifactorial etiology from underlying chronic illness, malignancy. Patient required 2 units of PRBC.  Acute on chronic respiratory failure with hypoxia and hypercapnia (HCC) Patient's baseline is 6 L/min oxygen via [place]. Patient weaned to home oxygen dose at rest, but still required increased  oxygen rate with activity. Now comfort measures. Keep oxygen at (not above nor below) 6 L/min. Manage distress with Morphine/Ativan as needed.  Acute metabolic encephalopathy Initially resolved. Now with encephalopathy related to hypercapnia and current comfort measures status.  Adenocarcinoma of left lung, stage 3 (HCC) Follows with Dr. Lisbeth Renshaw and Dr. Julien Nordmann. Patient transitioned to full comfort measures during this hospitalization.  COPD (chronic obstructive pulmonary disease) ([place]) Patient treated with oxygen therapy, steroid taper and Duoneb nebulizer treatments. BiPAP used overnight. No comfort measures.    DVT prophylaxis: Comfort measures Code Status:   Code Status: DNR Family Communication: Husband at bedside Disposition Plan: Anticipate hospital death   Consultants:  PCCM Cardiology Neurology Infectious disease  Procedures:    Antimicrobials: Vancomycin Cefepime Ceftriaxone Flagyl Acyclovir Ampicillin    Subjective: Patient with some trouble breathing this morning. No other issues noted.  Objective: Vitals:   10/07/21 0630 10/07/21 0700 10/07/21 0800 10/07/21 1000  BP:      Pulse: 96 94 100 85  Resp: _0 Temp:      TempSrc:      SpO2: 93% (!) 89% (!) 89% (!) 45%  Weight:      Height:        Intake/Output Summary (Last 24 hours) at 10/07/2021 1057 Last data filed at 10/07/2021 0400 Gross per 24 hour  Intake 220 ml  Output 550 ml  Net -330 ml    Filed Weights   09/16/21 1200 09/16/21 1600 09/18/21 0500  Weight: 75 kg 75 kg 85.4 kg    Examination:  General: Mild distress. Sitting up in bed. Respiratory: Slightly labored work of breathing. Cardiovascular: Regular rate and rhythm. Normal S1 and S2. No heart murmurs present. No extra heart sounds Gastrointestinal: Soft, non-tender, non-distended, no guarding, no rebound, no masses felt  Data Reviewed: I have personally reviewed following labs and imaging studies  CBC Lab Results   Component Value Date   WBC 6.0 09/30/2021   RBC 2.84 (L) 09/30/2021   HGB 7.6 (L) 09/30/2021   HCT 26.5 (L) 09/30/2021   MCV 93.3 09/30/2021   MCH 26.8 09/30/2021   PLT 120 (L) 09/30/2021   MCHC 28.7 (L) 09/30/2021   RDW 19.8 (H) 09/30/2021   LYMPHSABS 0.2 (L) 09/28/2021   MONOABS 0.6 09/28/2021   EOSABS 0.0 09/28/2021   BASOSABS 0.0 12/26/4386     Last metabolic panel Lab Results  Component Value Date   NA 138 09/30/2021   K 4.5 09/30/2021   CL 91 (L) 09/30/2021   CO2 41 (H) 09/30/2021   BUN 28 (H) 09/30/2021   CREATININE 1.28 (H) 09/30/2021   GLUCOSE 84 09/30/2021   GFRNONAA 47 (L) 09/30/2021   CALCIUM 8.4 (L) 09/30/2021   PHOS 2.5 09/18/2021   PROT 6.0 (L) 09/30/2021   ALBUMIN 2.3 (L) 09/30/2021   BILITOT 0.6 09/30/2021   ALKPHOS 50 09/30/2021   AST 12 (L) 09/30/2021   ALT 13 09/30/2021   ANIONGAP 6 09/30/2021   GFR: Estimated Creatinine Clearance: 52.5 mL/min (A) (by C-G formula based on SCr of 1.28 mg/dL (H)).   Radiology Studies: No results found.  Scheduled Meds:  Chlorhexidine Gluconate Cloth  6 each Topical Daily   mouth rinse  15 mL Mouth Rinse BID   predniSONE  20 mg Oral Q breakfast   PruTect   Topical BID   sodium chloride flush  10-40 mL Intracatheter Q12H   vitamin B-12  1,000 mcg Oral Daily   Continuous Infusions:  sodium chloride 10 mL/hr at 09/22/21 0559   sodium chloride Stopped (09/18/21 0523)     LOS: 26 days     Cordelia Poche, MD [facility] 10/07/2021, 10:57 AM  If 7PM-7AM, please contact night-coverage [email or web address]

## 2021-10-07 NOTE — Progress Notes (Signed)
Followed up with family who were in the waiting room.  They are doing okay for now and declined talking further today.  [employer], [place] Pager, [phone] 2:02 PM

## 2021-10-07 NOTE — Progress Notes (Signed)
Daily Progress Note   Patient Name: Patricia Sampson       Date: 10/07/2021 DOB: Apr 22, 1956  Age: 65 y.o. MRN#: [id number] Attending Physician: Mariel Aloe, MD Primary Care Physician: Greig Right, MD Admit Date: 09/14/2021  Reason for Consultation/Follow-up: Establishing goals of care  Subjective:  Patricia Sampson is resting in bed, her sister Patricia Sampson is at the bedside.  Discussed with nursing colleague, patient with ongoing symptom needs, low O2 sats, dyspnea and some anxiety earlier today, received several doses of IV Morphine as well as Ativan.     Length of Stay: 26  Current Medications: Scheduled Meds:  . Chlorhexidine Gluconate Cloth  6 each Topical Daily  . mouth rinse  15 mL Mouth Rinse BID  . predniSONE  20 mg Oral Q breakfast  . PruTect   Topical BID  . sodium chloride flush  10-40 mL Intracatheter Q12H  . vitamin B-12  1,000 mcg Oral Daily    Continuous Infusions: . sodium chloride 10 mL/hr at 09/22/21 0559  . sodium chloride Stopped (09/18/21 0523)    PRN Meds: sodium chloride, acetaminophen **OR** acetaminophen, ammonium lactate, glycopyrrolate, haloperidol lactate, ipratropium-albuterol, lip balm, LORazepam, magic mouthwash, morphine injection, ondansetron **OR** ondansetron (ZOFRAN) IV, oxyCODONE, sodium chloride flush  Physical Exam         General: not awake today, has shallow breathing.  HEENT:  on O2 [place].  Appears with generalized weakness No distress.     Vital Signs: BP (!) 89/58 (BP Location: Right Arm)   Pulse 85   Temp (!) 97.5 F (36.4 C) (Axillary)   Resp 19   Ht 5' 9" (1.753 m)   Wt 85.4 kg   SpO2 (!) 45%   BMI 27.80 kg/m  SpO2: SpO2: (!) 45 % O2 Device: O2 Device: High Flow Nasal Cannula O2 Flow Rate: O2 Flow Rate (L/min): 6  L/min  Intake/output summary:  Intake/Output Summary (Last 24 hours) at 10/07/2021 1146 Last data filed at 10/07/2021 0400 Gross per 24 hour  Intake 200 ml  Output 550 ml  Net -350 ml    LBM: Last BM Date: 10/01/21 Baseline Weight: Weight: 75 kg Most recent weight: Weight: 85.4 kg       Palliative Assessment/Data:    Flowsheet Rows    Flowsheet Row Most Recent Value  Intake Tab  Referral Department Hospitalist  Unit at Time of Referral ICU  Palliative Care Primary Diagnosis Sepsis/Infectious Disease  Date Notified 09/15/21  Palliative Care Type New Palliative care  Reason for referral Clarify Goals of Care  Date of Admission 09/18/2021  Date first seen by Palliative Care 09/16/21  # of days Palliative referral response time 1 Day(s)  # of days IP prior to Palliative referral 4  Clinical Assessment   Palliative Performance Scale Score 40%  Psychosocial & Spiritual Assessment   Palliative Care Outcomes        Patient Active Problem List   Diagnosis Date Noted  . Comfort measures only status 10/04/2021  . Atrial fibrillation with rapid ventricular response ([place])   . Normocytic anemia 09/23/2021  . AKI (acute kidney injury) ([place]) 09/23/2021  . Palliative care by specialist   . Goals of care, counseling/discussion   . Acute on chronic respiratory failure with hypoxia and hypercapnia (HCC)   . PAF (paroxysmal atrial fibrillation) ([place])   . Acute metabolic encephalopathy   . Microcytic anemia 08/21/2021  . Adenocarcinoma of left lung, stage 3 ([place]) 07/19/2021  . Encounter for antineoplastic chemotherapy 07/19/2021  . Cancer associated pain 07/19/2021  . Pneumonia due to COVID-19 virus 11/25/2019  . Shortness of breath 11/18/2019  . Chronic respiratory failure with hypoxia ([place]) 11/18/2019  . COPD (chronic obstructive pulmonary disease) ([place]) 01/27/2014  . Hypoxemia 01/27/2014  . Secondary pulmonary arterial hypertension ([place]) 01/27/2014    Palliative Care  Assessment & Plan   Patient Profile: 65 y.o. female  with past medical history of severe COPD, chronic hypoxic respiratory failure on 4-8 L, hypertension, hyperlipidemia with recent diagnosis of stage III adenocarcinoma with large left upper lobe mass and chest wall invasion in August of this year who is currently undergoing chemoradiation admitted on 09/14/2021 with altered mental status with concern for sepsis.  Her mental status over the past several days has remained poor and far from her baseline.  Palliative consulted for goals of care.  Recommendations/Plan: DNR/DNI Discussed with sister Patricia Sampson at bedside, continue IV Morphine PRN and monitor. End of life signs and symptoms discussed with sister at bedside, she is supporting the son and daughter as well.   Anticipate hospital death as per Dr Kirstie Mirza discussions with the patient and her family, patient with high risk for ongoing decline decompensation and death.   Goals of Care and Additional Recommendations: Limitations on Scope of Treatment: comfort measures.   Code Status: is now listed as DNR.     Code Status Orders  (From admission, onward)           Start     Ordered   09/24/2021 2128  Full code  Continuous        09/18/2021 2127           Code Status History     This patient has a current code status but no historical code status.       Prognosis: Likely  hours to days.   Discharge Planning: Anticipate hospital death  Care plan was discussed with patient, family, bedside interdisciplinary care team  Thank you for allowing the Palliative Medicine Team to assist in the care of this patient.   Total Time 25 Prolonged Time Billed No  Greater than 50%  of this time was spent counseling and coordinating care related to the above assessment and plan.  Loistine Chance, MD  Please contact Palliative Medicine Team phone at [phone] for questions and concerns.

## 2021-10-08 DIAGNOSIS — R6510 Systemic inflammatory response syndrome (SIRS) of non-infectious origin without acute organ dysfunction: Secondary | ICD-10-CM | POA: Diagnosis not present

## 2021-10-08 NOTE — Progress Notes (Signed)
PROGRESS NOTE    Patricia Sampson  BTD:[id number] DOB: May 25, 1956 DOA: 09/12/2021 PCP: Greig Right, MD   Brief Narrative: Patricia Sampson is a 65 y.o. female with PMH Stage IIIa LUL adenocarcinoma, chronic hypoxic respiratory failure 6L O2 at home, HTN, HLD who presented with AMS. She underwent extensive workup but ultimately an LP was declined. She was treated empirically with abx and acyclovir for encephalitis and her mentation improved. Further into her hospitalization, she developed worsened respiratory failure needing BiPAP therapy and subsequently unstable atrial fibrillation requiring initiation of amiodarone IV and worsening respiratory acidosis. Patient required frequent BiPAP throughout the day to allow her to stay alert. Goals of care discussions led to transition to comfort measures.   Assessment & Plan:   * SIRS (systemic inflammatory response syndrome) (HCC)-resolved as of 09/22/2021 No clear source ever presented; evaluated by ID as well; LP not pursued per family wish. Empirically treated with Ceftriaxone and Acyclovir. Completed course.  PAF (paroxysmal atrial fibrillation) ([place]) Likely related to SIRS/hospitalization. Cardiology consulted. Patient managed on amiodarone and heparin IV. Heparin IV transitioned to Eliquis. Now comfort measures.  Atrial fibrillation with rapid ventricular response ([place]) As mentioned above.  AKI (acute kidney injury) ([place]) Baseline creatinine of 0.7. Peak of 1.50. Concern this was multifactorial in setting of hypotension, acyclovir, poor oral intake. Overall, function remained stable and mildly elevated even with IV fluids.  Normocytic anemia Multifactorial etiology from underlying chronic illness, malignancy. Patient required 2 units of PRBC.  Acute on chronic respiratory failure with hypoxia and hypercapnia (HCC) Patient's baseline is 6 L/min oxygen via [place]. Patient weaned to home oxygen dose at rest, but still required increased  oxygen rate with activity. Now comfort measures. Keep oxygen at (not above nor below) 6 L/min. Manage distress with Morphine/Ativan as needed.  Acute metabolic encephalopathy Initially resolved. Now with encephalopathy related to hypercapnia and current comfort measures status.  Adenocarcinoma of left lung, stage 3 (HCC) Follows with Dr. Lisbeth Renshaw and Dr. Julien Nordmann. Patient transitioned to full comfort measures during this hospitalization.  COPD (chronic obstructive pulmonary disease) ([place]) Patient treated with oxygen therapy, steroid taper and Duoneb nebulizer treatments. BiPAP used overnight. No comfort measures.     DVT prophylaxis: Comfort measures Code Status:   Code Status: DNR Family Communication: Husband at bedside Disposition Plan: Anticipate hospital death   Consultants:  PCCM Cardiology Neurology Infectious disease  Procedures:    Antimicrobials: Vancomycin Cefepime Ceftriaxone Flagyl Acyclovir Ampicillin    Subjective: No issues noted overnight.  Objective: Vitals:   10/07/21 2000 10/07/21 2300 10/08/21 0000 10/08/21 0100  BP:      Pulse:  96 97 100  Resp:  _0 Temp:      TempSrc:      SpO2: 97% 96% 96% 95%  Weight:      Height:        Intake/Output Summary (Last 24 hours) at 10/08/2021 0721 Last data filed at 10/08/2021 0700 Gross per 24 hour  Intake --  Output 450 ml  Net -450 ml    Filed Weights   09/16/21 1200 09/16/21 1600 09/18/21 0500  Weight: 75 kg 75 kg 85.4 kg    Examination:  General: Rested appearing, no distress Respiratory: Unlabored work of breathing. No tachypnea.   Data Reviewed: I have personally reviewed following labs and imaging studies  CBC Lab Results  Component Value Date   WBC 6.0 09/30/2021   RBC 2.84 (L) 09/30/2021   HGB 7.6 (L) 09/30/2021   HCT  26.5 (L) 09/30/2021   MCV 93.3 09/30/2021   MCH 26.8 09/30/2021   PLT 120 (L) 09/30/2021   MCHC 28.7 (L) 09/30/2021   RDW 19.8 (H) 09/30/2021    LYMPHSABS 0.2 (L) 09/28/2021   MONOABS 0.6 09/28/2021   EOSABS 0.0 09/28/2021   BASOSABS 0.0 66/44/0347     Last metabolic panel Lab Results  Component Value Date   NA 138 09/30/2021   K 4.5 09/30/2021   CL 91 (L) 09/30/2021   CO2 41 (H) 09/30/2021   BUN 28 (H) 09/30/2021   CREATININE 1.28 (H) 09/30/2021   GLUCOSE 84 09/30/2021   GFRNONAA 47 (L) 09/30/2021   CALCIUM 8.4 (L) 09/30/2021   PHOS 2.5 09/18/2021   PROT 6.0 (L) 09/30/2021   ALBUMIN 2.3 (L) 09/30/2021   BILITOT 0.6 09/30/2021   ALKPHOS 50 09/30/2021   AST 12 (L) 09/30/2021   ALT 13 09/30/2021   ANIONGAP 6 09/30/2021   GFR: Estimated Creatinine Clearance: 52.5 mL/min (A) (by C-G formula based on SCr of 1.28 mg/dL (H)).   Radiology Studies: No results found.  Scheduled Meds:  Chlorhexidine Gluconate Cloth  6 each Topical Daily   mouth rinse  15 mL Mouth Rinse BID   predniSONE  20 mg Oral Q breakfast   PruTect   Topical BID   sodium chloride flush  10-40 mL Intracatheter Q12H   vitamin B-12  1,000 mcg Oral Daily   Continuous Infusions:  sodium chloride 10 mL/hr at 09/22/21 0559   sodium chloride Stopped (09/18/21 0523)     LOS: 27 days     Cordelia Poche, MD [facility] 10/08/2021, 7:21 AM  If 7PM-7AM, please contact night-coverage [email or web address]

## 2021-10-08 NOTE — TOC Progression Note (Signed)
Transition of Care [facility]) - Progression Note    Patient Details  Name: TYKIA MELLONE MRN: [id number] Date of Birth: 1956-02-25  [facility]) CM/SW Contact  Ross Ludwig, [place] Phone Number: 10/08/2021, 2:06 PM  Clinical Narrative:     CSW continuing to follow patient's progress.  Per physician, patient still on comfort care, and likely hospital death.  Expected Discharge Plan: Home/Self Care Barriers to Discharge: Barriers Resolved  Expected Discharge Plan and Services Expected Discharge Plan: Home/Self Care   Discharge Planning Services: CM Consult   Living arrangements for the past 2 months: Single Family Home                 DME Arranged: Ventilator (TRIOLOGY FOR BIPAP) DME Agency: [employer] Date DME Agency Contacted: 09/26/21   Representative spoke with at DME Agency: [email or web address]             Social Determinants of Health ([place]) Interventions    Readmission Risk Interventions No flowsheet data found.

## 2021-10-09 DIAGNOSIS — J9621 Acute and chronic respiratory failure with hypoxia: Secondary | ICD-10-CM | POA: Diagnosis not present

## 2021-10-09 DIAGNOSIS — R6510 Systemic inflammatory response syndrome (SIRS) of non-infectious origin without acute organ dysfunction: Secondary | ICD-10-CM | POA: Diagnosis not present

## 2021-10-09 DIAGNOSIS — G9341 Metabolic encephalopathy: Secondary | ICD-10-CM | POA: Diagnosis not present

## 2021-10-09 DIAGNOSIS — I4891 Unspecified atrial fibrillation: Secondary | ICD-10-CM | POA: Diagnosis not present

## 2021-10-09 DIAGNOSIS — N179 Acute kidney failure, unspecified: Secondary | ICD-10-CM | POA: Diagnosis not present

## 2021-10-09 NOTE — Progress Notes (Signed)
PROGRESS NOTE    Patricia Sampson  TLX:[id number] DOB: 05-Jun-1956 DOA: 09/13/2021 PCP: Greig Right, MD   Brief Narrative: Patricia Sampson is a 65 y.o. female with PMH Stage IIIa LUL adenocarcinoma, chronic hypoxic respiratory failure 6L O2 at home, HTN, HLD who presented with AMS. She underwent extensive workup but ultimately an LP was declined. She was treated empirically with abx and acyclovir for encephalitis and her mentation improved. Further into her hospitalization, she developed worsened respiratory failure needing BiPAP therapy and subsequently unstable atrial fibrillation requiring initiation of amiodarone IV and worsening respiratory acidosis. Patient required frequent BiPAP throughout the day to allow her to stay alert. Goals of care discussions led to transition to comfort measures.   Assessment & Plan:   * SIRS (systemic inflammatory response syndrome) (HCC)-resolved as of 09/22/2021 No clear source ever presented; evaluated by ID as well; LP not pursued per family wish. Empirically treated with Ceftriaxone and Acyclovir. Completed course.  PAF (paroxysmal atrial fibrillation) ([place]) Likely related to SIRS/hospitalization. Cardiology consulted. Patient managed on amiodarone and heparin IV. Heparin IV transitioned to Eliquis. Now comfort measures.  Atrial fibrillation with rapid ventricular response ([place]) As mentioned above.  AKI (acute kidney injury) ([place]) Baseline creatinine of 0.7. Peak of 1.50. Concern this was multifactorial in setting of hypotension, acyclovir, poor oral intake. Overall, function remained stable and mildly elevated even with IV fluids.  Normocytic anemia Multifactorial etiology from underlying chronic illness, malignancy. Patient required 2 units of PRBC.  Acute on chronic respiratory failure with hypoxia and hypercapnia (HCC) Patient's baseline is 6 L/min oxygen via [place]. Patient weaned to home oxygen dose at rest, but still required increased  oxygen rate with activity. Now comfort measures. Keep oxygen at (not above nor below) 6 L/min. Manage distress with Morphine/Ativan as needed.  Acute metabolic encephalopathy Initially resolved. Now with encephalopathy related to hypercapnia and current comfort measures status.  Adenocarcinoma of left lung, stage 3 (HCC) Follows with Dr. Lisbeth Renshaw and Dr. Julien Nordmann. Patient transitioned to full comfort measures during this hospitalization.  COPD (chronic obstructive pulmonary disease) ([place]) Patient treated with oxygen therapy, steroid taper and Duoneb nebulizer treatments. BiPAP used overnight. No comfort measures.     DVT prophylaxis: Comfort measures Code Status:   Code Status: DNR Family Communication: Husband at bedside Disposition Plan: Anticipate hospital death   Consultants:  PCCM Cardiology Neurology Infectious disease  Procedures:    Antimicrobials: Vancomycin Cefepime Ceftriaxone Flagyl Acyclovir Ampicillin    Subjective: Patient reports some dyspnea. No other concerns. On visual inspection, O2 set up to 10 L/min.  Objective: Vitals:   10/08/21 1600 10/08/21 1700 10/08/21 1800 10/09/21 0840  BP:      Pulse: 99   94  Resp: (!) _0 Temp:    (!) 97.1 F (36.2 C)  TempSrc:    Axillary  SpO2: (!) 78%   (!) 69%  Weight:      Height:        Intake/Output Summary (Last 24 hours) at 10/09/2021 1309 Last data filed at 10/09/2021 0600 Gross per 24 hour  Intake --  Output 300 ml  Net -300 ml    Filed Weights   09/16/21 1200 09/16/21 1600 09/18/21 0500  Weight: 75 kg 75 kg 85.4 kg    Examination:  General exam: Appears calm and comfortable Respiratory system: Diminished. Respiratory effort normal. Cardiovascular system: S1 & S2 heard, RRR. No murmurs, rubs, gallops or clicks. Gastrointestinal system: Abdomen is nondistended, soft and nontender. No  organomegaly or masses felt. Normal bowel sounds heard. Central nervous system: Alert   Data  Reviewed: I have personally reviewed following labs and imaging studies  CBC Lab Results  Component Value Date   WBC 6.0 09/30/2021   RBC 2.84 (L) 09/30/2021   HGB 7.6 (L) 09/30/2021   HCT 26.5 (L) 09/30/2021   MCV 93.3 09/30/2021   MCH 26.8 09/30/2021   PLT 120 (L) 09/30/2021   MCHC 28.7 (L) 09/30/2021   RDW 19.8 (H) 09/30/2021   LYMPHSABS 0.2 (L) 09/28/2021   MONOABS 0.6 09/28/2021   EOSABS 0.0 09/28/2021   BASOSABS 0.0 73/42/8768     Last metabolic panel Lab Results  Component Value Date   NA 138 09/30/2021   K 4.5 09/30/2021   CL 91 (L) 09/30/2021   CO2 41 (H) 09/30/2021   BUN 28 (H) 09/30/2021   CREATININE 1.28 (H) 09/30/2021   GLUCOSE 84 09/30/2021   GFRNONAA 47 (L) 09/30/2021   CALCIUM 8.4 (L) 09/30/2021   PHOS 2.5 09/18/2021   PROT 6.0 (L) 09/30/2021   ALBUMIN 2.3 (L) 09/30/2021   BILITOT 0.6 09/30/2021   ALKPHOS 50 09/30/2021   AST 12 (L) 09/30/2021   ALT 13 09/30/2021   ANIONGAP 6 09/30/2021   GFR: Estimated Creatinine Clearance: 52.5 mL/min (A) (by C-G formula based on SCr of 1.28 mg/dL (H)).   Radiology Studies: No results found.  Scheduled Meds:  Chlorhexidine Gluconate Cloth  6 each Topical Daily   mouth rinse  15 mL Mouth Rinse BID   predniSONE  20 mg Oral Q breakfast   PruTect   Topical BID   sodium chloride flush  10-40 mL Intracatheter Q12H   vitamin B-12  1,000 mcg Oral Daily   Continuous Infusions:  sodium chloride 10 mL/hr at 09/22/21 0559   sodium chloride Stopped (09/18/21 0523)     LOS: 28 days     Cordelia Poche, MD [facility] 10/09/2021, 1:09 PM  If 7PM-7AM, please contact night-coverage [email or web address]

## 2021-10-09 NOTE — Progress Notes (Signed)
Chaplain engaged in a follow-up visit with Lorelie, her best friend, and her sister. Chaplain spent a significant amount of time getting to know them and [place] through their perspective.  Chaplain learned of their own healthcare journeys, how Jolana was growing up, and about their family.    Chaplain offered, presence, listening and support.     10/09/21 1500  Clinical Encounter Type  Visited With Patient and family together  Visit Type Follow-up

## 2021-10-09 NOTE — Progress Notes (Signed)
Daily Progress Note   Patient Name: Patricia Sampson       Date: 11/13 DOB: 10/01/1956  Age: 65 y.o. MRN#: [id number] Attending Physician: Mariel Aloe, MD Primary Care Physician: Greig Right, MD Admit Date: 08/27/2021  Reason for Consultation/Follow-up: Establishing goals of care  Subjective: MS. Henegar is actively dying. She is comfortable. Has received 55m of morphine in tge oast 24 hours and prn ativan. No needs from family or nursing.    Length of Stay: 28  Current Medications: Scheduled Meds:  . Chlorhexidine Gluconate Cloth  6 each Topical Daily  . mouth rinse  15 mL Mouth Rinse BID  . predniSONE  20 mg Oral Q breakfast  . PruTect   Topical BID  . sodium chloride flush  10-40 mL Intracatheter Q12H  . vitamin B-12  1,000 mcg Oral Daily    Continuous Infusions: . sodium chloride 10 mL/hr at 09/22/21 0559  . sodium chloride Stopped (09/18/21 0523)    PRN Meds: sodium chloride, acetaminophen **OR** acetaminophen, ammonium lactate, glycopyrrolate, haloperidol lactate, ipratropium-albuterol, lip balm, LORazepam, magic mouthwash, morphine injection, ondansetron **OR** ondansetron (ZOFRAN) IV, oxyCODONE, sodium chloride flush  Physical Exam         General: not awake today, has shallow breathing.  HEENT:  on O2 [place].  Appears with generalized weakness No distress.     Vital Signs: BP (!) 89/58 (BP Location: Right Arm)   Pulse 99   Temp (!) 97.5 F (36.4 C) (Axillary)   Resp 15   Ht 5' 9" (1.753 m)   Wt 85.4 kg   SpO2 (!) 78%   BMI 27.80 kg/m  SpO2: SpO2: (!) 78 % O2 Device: O2 Device: High Flow Nasal Cannula O2 Flow Rate: O2 Flow Rate (L/min): 6 L/min  Intake/output summary:  Intake/Output Summary (Last 24 hours) at 10/09/2021 0757 Last data filed at  10/09/2021 0600 Gross per 24 hour  Intake --  Output 300 ml  Net -300 ml    LBM: Last BM Date: 10/09/21 Baseline Weight: Weight: 75 kg Most recent weight: Weight: 85.4 kg       Palliative Assessment/Data:    Flowsheet Rows    Flowsheet Row Most Recent Value  Intake Tab   Referral Department Hospitalist  Unit at Time of Referral ICU  Palliative Care Primary Diagnosis Sepsis/Infectious  Disease  Date Notified 09/15/21  Palliative Care Type New Palliative care  Reason for referral Clarify Goals of Care  Date of Admission 09/07/2021  Date first seen by Palliative Care 09/16/21  # of days Palliative referral response time 1 Day(s)  # of days IP prior to Palliative referral 4  Clinical Assessment   Palliative Performance Scale Score 40%  Psychosocial & Spiritual Assessment   Palliative Care Outcomes        Patient Active Problem List   Diagnosis Date Noted  . Comfort measures only status 10/04/2021  . Atrial fibrillation with rapid ventricular response ([place])   . Normocytic anemia 09/23/2021  . AKI (acute kidney injury) ([place]) 09/23/2021  . Palliative care by specialist   . Goals of care, counseling/discussion   . Acute on chronic respiratory failure with hypoxia and hypercapnia (HCC)   . PAF (paroxysmal atrial fibrillation) ([place])   . Acute metabolic encephalopathy   . Microcytic anemia 08/21/2021  . Adenocarcinoma of left lung, stage 3 ([place]) 07/19/2021  . Encounter for antineoplastic chemotherapy 07/19/2021  . Cancer associated pain 07/19/2021  . Pneumonia due to COVID-19 virus 11/25/2019  . Shortness of breath 11/18/2019  . Chronic respiratory failure with hypoxia ([place]) 11/18/2019  . COPD (chronic obstructive pulmonary disease) ([place]) 01/27/2014  . Hypoxemia 01/27/2014  . Secondary pulmonary arterial hypertension ([place]) 01/27/2014    Palliative Care Assessment & Plan   Patient Profile: 65 y.o. female  with past medical history of severe COPD, chronic hypoxic  respiratory failure on 4-8 L, hypertension, hyperlipidemia with recent diagnosis of stage III adenocarcinoma with large left upper lobe mass and chest wall invasion in August of this year who is currently undergoing chemoradiation admitted on 09/24/2021 with altered mental status with concern for sepsis.  Her mental status over the past several days has remained poor and far from her baseline.  Palliative consulted for goals of care.  Recommendations/Plan: DNR/DNI Goals are comfort care. Continue IV Morphine PRN and monitor. Anticipate hospital death- not stable for transfer to hospice facility. Goals of Care and Additional Recommendations: Limitations on Scope of Treatment: comfort measures.   Code Status: is now listed as DNR.     Code Status Orders  (From admission, onward)           Start     Ordered   09/08/2021 2128  Full code  Continuous        09/25/2021 2127           Code Status History     This patient has a current code status but no historical code status.       Prognosis: Likely  hours to days.   Discharge Planning: Anticipate hospital death  Care plan was discussed with patient, family, bedside interdisciplinary care team  Thank you for allowing the Palliative Medicine Team to assist in the care of this patient.   Total Time 25 Prolonged Time Billed No  Greater than 50%  of this time was spent counseling and coordinating care related to the above assessment and plan.  Lane Hacker, DO  Please contact Palliative Medicine Team phone at [phone] for questions and concerns.

## 2021-10-10 DIAGNOSIS — R6510 Systemic inflammatory response syndrome (SIRS) of non-infectious origin without acute organ dysfunction: Secondary | ICD-10-CM | POA: Diagnosis not present

## 2021-10-10 DIAGNOSIS — J9621 Acute and chronic respiratory failure with hypoxia: Secondary | ICD-10-CM | POA: Diagnosis not present

## 2021-10-10 DIAGNOSIS — Z515 Encounter for palliative care: Secondary | ICD-10-CM | POA: Diagnosis not present

## 2021-10-10 DIAGNOSIS — J9622 Acute and chronic respiratory failure with hypercapnia: Secondary | ICD-10-CM | POA: Diagnosis not present

## 2021-10-10 MED ORDER — ALTEPLASE 2 MG IJ SOLR
2.0000 mg | Freq: Once | INTRAMUSCULAR | Status: DC
  Filled 2021-10-10: qty 2

## 2021-10-10 NOTE — Progress Notes (Signed)
Daily Progress Note   Patient Name: Patricia Sampson       Date: 11/14 DOB: [birth year]  Age: 65 y.o. MRN#: [id number] Attending Physician: Mariel Aloe, MD Primary Care Physician: Greig Right, MD Admit Date: 08/27/2021  Reason for Consultation/Follow-up: Establishing goals of care  Subjective: MS. [place] is actively dying. She is comfortable. Family at bedside. They expressed concerns for administration of morphine and for level of alertness.   Length of Stay: 28  Current Medications: Scheduled Meds:  . Chlorhexidine Gluconate Cloth  6 each Topical Daily  . mouth rinse  15 mL Mouth Rinse BID  . predniSONE  20 mg Oral Q breakfast  . PruTect   Topical BID  . sodium chloride flush  10-40 mL Intracatheter Q12H  . vitamin B-12  1,000 mcg Oral Daily    Continuous Infusions: . sodium chloride 10 mL/hr at 09/22/21 0559  . sodium chloride Stopped (09/18/21 0523)    PRN Meds: sodium chloride, acetaminophen **OR** acetaminophen, ammonium lactate, glycopyrrolate, haloperidol lactate, ipratropium-albuterol, lip balm, LORazepam, magic mouthwash, morphine injection, ondansetron **OR** ondansetron (ZOFRAN) IV, oxyCODONE, sodium chloride flush  Physical Exam         General: not awake today, has shallow breathing.  HEENT:  on O2 [place].  Appears with generalized weakness No distress.     Vital Signs: BP (!) 89/58 (BP Location: Right Arm)   Pulse 99   Temp (!) 97.5 F (36.4 C) (Axillary)   Resp 15   Ht _0  (1.753 m)   Wt 85.4 kg   SpO2 (!) 78%   BMI 27.80 kg/m  SpO2: SpO2: (!) 78 % O2 Device: O2 Device: High Flow Nasal Cannula O2 Flow Rate: O2 Flow Rate (L/min): 6 L/min  Intake/output summary:  Intake/Output Summary (Last 24 hours) at 10/09/2021 0757 Last data filed at  10/09/2021 0600 Gross per 24 hour  Intake --  Output 300 ml  Net -300 ml    LBM: Last BM Date: 10/09/21 Baseline Weight: Weight: 75 kg Most recent weight: Weight: 85.4 kg       Palliative Assessment/Data:    Flowsheet Rows    Flowsheet Row Most Recent Value  Intake Tab   Referral Department Hospitalist  Unit at Time of Referral ICU  Palliative Care Primary Diagnosis Sepsis/Infectious Disease  Date Notified 09/15/21  Palliative Care Type New Palliative care  Reason for referral Clarify Goals of Care  Date of Admission 09/16/2021  Date first seen by Palliative Care 09/16/21  # of days Palliative referral response time 1 Day(s)  # of days IP prior to Palliative referral 4  Clinical Assessment   Palliative Performance Scale Score 40%  Psychosocial & Spiritual Assessment   Palliative Care Outcomes        Patient Active Problem List   Diagnosis Date Noted  . Comfort measures only status 10/04/2021  . Atrial fibrillation with rapid ventricular response ([place])   . Normocytic anemia 09/23/2021  . AKI (acute kidney injury) ([place]) 09/23/2021  . Palliative care by specialist   . Goals of care, counseling/discussion   . Acute on chronic respiratory failure with hypoxia and hypercapnia (HCC)   . PAF (paroxysmal atrial fibrillation) ([place])   . Acute metabolic encephalopathy   . Microcytic anemia 08/21/2021  . Adenocarcinoma of left lung, stage 3 ([place]) 07/19/2021  . Encounter for antineoplastic chemotherapy 07/19/2021  . Cancer associated pain 07/19/2021  . Pneumonia due to COVID-19 virus 11/25/2019  . Shortness of breath 11/18/2019  . Chronic respiratory failure with hypoxia ([place]) 11/18/2019  . COPD (chronic obstructive pulmonary disease) ([place]) 01/27/2014  . Hypoxemia 01/27/2014  . Secondary pulmonary arterial hypertension ([place]) 01/27/2014    Palliative Care Assessment & Plan   Patient Profile: 65 y.o. female  with past medical history of severe COPD, chronic hypoxic  respiratory failure on 4-8 L, hypertension, hyperlipidemia with recent diagnosis of stage III adenocarcinoma with large left upper lobe mass and chest wall invasion in August of this year who is currently undergoing chemoradiation admitted on 09/16/2021 with altered mental status with concern for sepsis.  Her mental status over the past several days has remained poor and far from her baseline.  Palliative consulted for goals of care.  Recommendations/Plan:  I spoke with family at length today re: comfort care and her current condition. Her son expressed concerns for over medication with morphine and states the he "doesn't want his mother doped up". I explained the dying process and specifically respiratory failure impact on her mentation- ie: rising CO2 levels and narcosis. I provided reassurance that she would only be given the amount of medicaton required to control her suffering -no more or no less. I discussed the challenging line between maintaining her ability to communicate and also controlling symptoms. I reassured them that her cause of death was her underlying medical problems and that morphine does not hasten the dying process. I provided empathetic presence.   She is experiencing prolonged dying but having periods of apnea and hypoxia with saturations in the 60's.   DNR/DNI Goals are comfort care. Continue IV Morphine PRN and monitor. Anticipate hospital death- not stable for transfer to hospice facility. Goals of Care and Additional Recommendations: Limitations on Scope of Treatment: comfort measures.   Code Status: is now listed as DNR.     Code Status Orders  (From admission, onward)           Start     Ordered   08/31/2021 2128  Full code  Continuous        08/27/2021 2127           Code Status History     This patient has a current code status but no historical code status.       Prognosis: Likely  hours to days.   Discharge Planning: Anticipate hospital  death  Care  plan was discussed with patient, family, bedside interdisciplinary care team  Thank you for allowing the Palliative Medicine Team to assist in the care of this patient.   Total Time 35 Prolonged Time Billed No  Greater than 50%  of this time was spent counseling and coordinating care related to the above assessment and plan.  Lane Hacker, DO  Please contact Palliative Medicine Team phone at [phone] for questions and concerns.

## 2021-10-10 NOTE — Progress Notes (Addendum)
PROGRESS NOTE    Patricia Sampson  TIW:[id number] DOB: 1956-04-09 DOA: 09/21/2021 PCP: Greig Right, MD   Brief Narrative: Patricia Sampson is a 65 y.o. female with PMH Stage IIIa LUL adenocarcinoma, chronic hypoxic respiratory failure 6L O2 at home, HTN, HLD who presented with AMS. She underwent extensive workup but ultimately an LP was declined. She was treated empirically with abx and acyclovir for encephalitis and her mentation improved. Further into her hospitalization, she developed worsened respiratory failure needing BiPAP therapy and subsequently unstable atrial fibrillation requiring initiation of amiodarone IV and worsening respiratory acidosis. Patient required frequent BiPAP throughout the day to allow her to stay alert. Goals of care discussions led to transition to comfort measures.   Assessment & Plan:   * SIRS (systemic inflammatory response syndrome) (HCC)-resolved as of 09/22/2021 No clear source ever presented; evaluated by ID as well; LP not pursued per family wish. Empirically treated with Ceftriaxone and Acyclovir. Completed course.  PAF (paroxysmal atrial fibrillation) ([place]) Likely related to SIRS/hospitalization. Cardiology consulted. Patient managed on amiodarone and heparin IV. Heparin IV transitioned to Eliquis. Now comfort measures.  Atrial fibrillation with rapid ventricular response ([place]) As mentioned above.  AKI (acute kidney injury) ([place]) Baseline creatinine of 0.7. Peak of 1.50. Concern this was multifactorial in setting of hypotension, acyclovir, poor oral intake. Overall, function remained stable and mildly elevated even with IV fluids.  Normocytic anemia Multifactorial etiology from underlying chronic illness, malignancy. Patient required 2 units of PRBC.  Acute on chronic respiratory failure with hypoxia and hypercapnia (HCC) Patient's baseline is 6 L/min oxygen via [place]. Patient weaned to home oxygen dose at rest, but still required increased  oxygen rate with activity. Now comfort measures. Keep oxygen at (not above nor below) 6 L/min. Manage distress with Morphine/Ativan as needed.  Acute metabolic encephalopathy Initially resolved. Now with encephalopathy related to hypercapnia and current comfort measures status.  Adenocarcinoma of left lung, stage 3 (HCC) Follows with Dr. Lisbeth Renshaw and Dr. Julien Nordmann. Patient transitioned to full comfort measures during this hospitalization.  COPD (chronic obstructive pulmonary disease) ([place]) Patient treated with oxygen therapy, steroid taper and Duoneb nebulizer treatments. BiPAP used overnight. Now comfort measures.    DVT prophylaxis: Comfort measures Code Status:   Code Status: DNR Family Communication: Unknown family member sleeping at bedside Disposition Plan: Anticipate hospital death   Consultants:  PCCM Cardiology Neurology Infectious disease Palliative care medicine  Procedures:    Antimicrobials: Vancomycin Cefepime Ceftriaxone Flagyl Acyclovir Ampicillin    Subjective: Resting peacefully. No issues noted overnight.  Objective: Vitals:   10/10/21 0500 10/10/21 0600 10/10/21 0700 10/10/21 0701  BP:      Pulse: 95 95 95 94  Resp: _0 (!) 9  Temp:      TempSrc:      SpO2: (!) 88% 91% 90% 90%  Weight:      Height:        Intake/Output Summary (Last 24 hours) at 10/10/2021 0817 Last data filed at 10/10/2021 0500 Gross per 24 hour  Intake --  Output 300 ml  Net -300 ml    Filed Weights   09/16/21 1200 09/16/21 1600 09/18/21 0500  Weight: 75 kg 75 kg 85.4 kg    Examination:  General: Rested. no distress   Data Reviewed: I have personally reviewed following labs and imaging studies  CBC Lab Results  Component Value Date   WBC 6.0 09/30/2021   RBC 2.84 (L) 09/30/2021   HGB 7.6 (L) 09/30/2021  HCT 26.5 (L) 09/30/2021   MCV 93.3 09/30/2021   MCH 26.8 09/30/2021   PLT 120 (L) 09/30/2021   MCHC 28.7 (L) 09/30/2021   RDW 19.8 (H)  09/30/2021   LYMPHSABS 0.2 (L) 09/28/2021   MONOABS 0.6 09/28/2021   EOSABS 0.0 09/28/2021   BASOSABS 0.0 62/56/3893     Last metabolic panel Lab Results  Component Value Date   NA 138 09/30/2021   K 4.5 09/30/2021   CL 91 (L) 09/30/2021   CO2 41 (H) 09/30/2021   BUN 28 (H) 09/30/2021   CREATININE 1.28 (H) 09/30/2021   GLUCOSE 84 09/30/2021   GFRNONAA 47 (L) 09/30/2021   CALCIUM 8.4 (L) 09/30/2021   PHOS 2.5 09/18/2021   PROT 6.0 (L) 09/30/2021   ALBUMIN 2.3 (L) 09/30/2021   BILITOT 0.6 09/30/2021   ALKPHOS 50 09/30/2021   AST 12 (L) 09/30/2021   ALT 13 09/30/2021   ANIONGAP 6 09/30/2021   GFR: Estimated Creatinine Clearance: 52.5 mL/min (A) (by C-G formula based on SCr of 1.28 mg/dL (H)).   Radiology Studies: No results found.  Scheduled Meds:  Chlorhexidine Gluconate Cloth  6 each Topical Daily   mouth rinse  15 mL Mouth Rinse BID   predniSONE  20 mg Oral Q breakfast   PruTect   Topical BID   sodium chloride flush  10-40 mL Intracatheter Q12H   vitamin B-12  1,000 mcg Oral Daily   Continuous Infusions:  sodium chloride 10 mL/hr at 09/22/21 0559   sodium chloride Stopped (09/18/21 0523)     LOS: 29 days     Cordelia Poche, MD [facility] 10/10/2021, 8:17 AM  If 7PM-7AM, please contact night-coverage [email or web address]

## 2021-10-10 NOTE — TOC Progression Note (Signed)
Transition of Care [facility]) - Progression Note    Patient Details  Name: ENRIKA AGUADO MRN: [id number] Date of Birth: [birth year]  [facility] CM/SW Contact  Leeroy Cha, RN Phone Number: 10/10/2021, 7:56 AM  Clinical Narrative:    Patient is comfort care.  Hospital death is expected.  Next birthday is [birth year]. Family has been in to see.   Expected Discharge Plan: Home/Self Care Barriers to Discharge: Barriers Resolved  Expected Discharge Plan and Services Expected Discharge Plan: Home/Self Care   Discharge Planning Services: CM Consult   Living arrangements for the past 2 months: Single Family Home                 DME Arranged: Ventilator (TRIOLOGY FOR BIPAP) DME Agency: [employer] Date DME Agency Contacted: 09/26/21   Representative spoke with at DME Agency: [employer]             Social Determinants of Health ([place]) Interventions    Readmission Risk Interventions No flowsheet data found.

## 2021-10-10 NOTE — Progress Notes (Signed)
Daily Progress Note   Patient Name: Patricia Sampson       Date: 11/14 DOB: 08/25/1956  Age: 65 y.o. MRN#: [id number] Attending Physician: Mariel Aloe, MD Primary Care Physician: Greig Right, MD Admit Date: 08/28/2021  Reason for Consultation/Follow-up: Establishing goals of care  Subjective: I saw and examined Ms. Menden.  She is actively dying.  Eyes are open but she does not speak or interact with me.  Her sister is present at the bedside.  Reports she had some coughing earlier today when moving in bed, but this is improved.  Length of Stay: 28  Current Medications: Scheduled Meds:  . Chlorhexidine Gluconate Cloth  6 each Topical Daily  . mouth rinse  15 mL Mouth Rinse BID  . predniSONE  20 mg Oral Q breakfast  . PruTect   Topical BID  . sodium chloride flush  10-40 mL Intracatheter Q12H  . vitamin B-12  1,000 mcg Oral Daily    Continuous Infusions: . sodium chloride 10 mL/hr at 09/22/21 0559  . sodium chloride Stopped (09/18/21 0523)    PRN Meds: sodium chloride, acetaminophen **OR** acetaminophen, ammonium lactate, glycopyrrolate, haloperidol lactate, ipratropium-albuterol, lip balm, LORazepam, magic mouthwash, morphine injection, ondansetron **OR** ondansetron (ZOFRAN) IV, oxyCODONE, sodium chloride flush  Physical Exam         General: not awake today, has shallow breathing.  HEENT:  on O2 .  Appears with generalized weakness No distress.     Vital Signs: BP (!) 89/58 (BP Location: Right Arm)   Pulse 99   Temp (!) 97.5 F (36.4 C) (Axillary)   Resp 15   Ht _0  (1.753 m)   Wt 85.4 kg   SpO2 (!) 78%   BMI 27.80 kg/m  SpO2: SpO2: (!) 78 % O2 Device: O2 Device: High Flow Nasal Cannula O2 Flow Rate: O2 Flow Rate (L/min): 6 L/min  Intake/output  summary:  Intake/Output Summary (Last 24 hours) at 10/09/2021 0757 Last data filed at 10/09/2021 0600 Gross per 24 hour  Intake --  Output 300 ml  Net -300 ml    LBM: Last BM Date: 10/09/21 Baseline Weight: Weight: 75 kg Most recent weight: Weight: 85.4 kg       Palliative Assessment/Data:    Flowsheet Rows    Flowsheet Row Most Recent Value  Intake  Tab   Referral Department Hospitalist  Unit at Time of Referral ICU  Palliative Care Primary Diagnosis Sepsis/Infectious Disease  Date Notified 09/15/21  Palliative Care Type New Palliative care  Reason for referral Clarify Goals of Care  Date of Admission 09/08/2021  Date first seen by Palliative Care 09/16/21  # of days Palliative referral response time 1 Day(s)  # of days IP prior to Palliative referral 4  Clinical Assessment   Palliative Performance Scale Score 40%  Psychosocial & Spiritual Assessment   Palliative Care Outcomes        Patient Active Problem List   Diagnosis Date Noted  . Comfort measures only status 10/04/2021  . Atrial fibrillation with rapid ventricular response ([place])   . Normocytic anemia 09/23/2021  . AKI (acute kidney injury) ([place]) 09/23/2021  . Palliative care by specialist   . Goals of care, counseling/discussion   . Acute on chronic respiratory failure with hypoxia and hypercapnia (HCC)   . PAF (paroxysmal atrial fibrillation) ([place])   . Acute metabolic encephalopathy   . Microcytic anemia 08/21/2021  . Adenocarcinoma of left lung, stage 3 ([place]) 07/19/2021  . Encounter for antineoplastic chemotherapy 07/19/2021  . Cancer associated pain 07/19/2021  . Pneumonia due to COVID-19 virus 11/25/2019  . Shortness of breath 11/18/2019  . Chronic respiratory failure with hypoxia ([place]) 11/18/2019  . COPD (chronic obstructive pulmonary disease) ([place]) 01/27/2014  . Hypoxemia 01/27/2014  . Secondary pulmonary arterial hypertension ([place]) 01/27/2014    Palliative Care Assessment & Plan   Patient  Profile: 65 y.o. female  with past medical history of severe COPD, chronic hypoxic respiratory failure on 4-8 L, hypertension, hyperlipidemia with recent diagnosis of stage III adenocarcinoma with large left upper lobe mass and chest wall invasion in August of this year who is currently undergoing chemoradiation admitted on 09/08/2021 with altered mental status with concern for sepsis.  Her mental status over the past several days has remained poor and far from her baseline.  Palliative consulted for goals of care.  Recommendations/Plan: DNR/DNI Goals are comfort care. Continue IV morphine as needed.  Anticipate hospital death.  She is not stable to transfer from the hospital at this point.   Goals of Care and Additional Recommendations: Limitations on Scope of Treatment: comfort measures.   Code Status: is now listed as DNR.     Code Status Orders  (From admission, onward)           Start     Ordered   09/10/2021 2128  Full code  Continuous        09/18/2021 2127           Code Status History     This patient has a current code status but no historical code status.       Prognosis: Likely  hours to days.   Discharge Planning: Anticipate hospital death  Care plan was discussed with patient, family, bedside interdisciplinary care team  Thank you for allowing the Palliative Medicine Team to assist in the care of this patient.   Total Time 20 Prolonged Time Billed No  Greater than 50%  of this time was spent counseling and coordinating care related to the above assessment and plan.  Micheline Rough, MD [place] Team [phone]  Please contact Palliative Medicine Team phone at [phone] for questions and concerns.

## 2021-10-26 NOTE — Progress Notes (Signed)
Chaplain learned of Catheline passing and went to follow up with family.  Family was gathered around her, but they preferred privacy with her.  [address] [place] Pager, [phone]

## 2021-10-26 NOTE — Progress Notes (Signed)
Noted without respirations, and heart rate at 0950, family at bedside.  MD made aware

## 2021-10-26 NOTE — Progress Notes (Signed)
Palliative Care Brief Note  I met today with patient's family in the hallway following her passing this morning.  Expressed condolences and offered support.  Family denied any other questions or needs at this time.  Please call if we can be of further support to family.  Micheline Rough, MD [place] Team [phone]  No charge note

## 2021-10-26 NOTE — Death Summary Note (Signed)
DEATH SUMMARY   Patient Details  Name: Patricia Sampson MRN: [id number] DOB: December 11, 1955  Admission/Discharge Information   Admit Date:  2021/09/25  Date of Death: Date of Death: 10-25-2021  Time of Death: Time of Death: 0950  Length of Stay: 28  Referring Physician: Greig Right, MD   Reason(s) for Hospitalization  Altered mental status   Diagnoses  Preliminary cause of death: acute hypoxic respiratory failure  Secondary Diagnoses (including complications and co-morbidities):  Active Problems:   COPD (chronic obstructive pulmonary disease) (HCC)   Adenocarcinoma of left lung, stage 3 (HCC)   Acute metabolic encephalopathy   PAF (paroxysmal atrial fibrillation) (HCC)   Acute on chronic respiratory failure with hypoxia and hypercapnia (HCC)   Palliative care by specialist   Goals of care, counseling/discussion   Normocytic anemia   AKI (acute kidney injury) ([place])   Atrial fibrillation with rapid ventricular response (HCC)   Comfort measures only status   Brief Hospital Course (including significant findings, care, treatment, and services provided and events leading to death)  Patricia Sampson is Patricia Sampson 65 y.o. female with PMH Stage IIIa LUL adenocarcinoma, chronic hypoxic respiratory failure 6L O2 at home, HTN, HLD who presented with AMS.  She underwent extensive workup but ultimately an LP was declined. She was treated empirically with abx and acyclovir for encephalitis and her mentation improved. Further into her hospitalization, she developed worsened respiratory failure needing BiPAP therapy and subsequently unstable atrial fibrillation requiring initiation of amiodarone IV and worsening respiratory acidosis. Patient required frequent BiPAP throughout the day to allow her to stay alert. Goals of care discussions led to transition to comfort measures.  She passed away on 2023-10-26 prior to me seeing her.  I expressed condolences to the family.  See prior notes for additional  details  Pertinent Labs and Studies  Significant Diagnostic Studies DG Chest 1 View  Result Date: 09/29/2021 CLINICAL DATA:  Shortness of breath. Left upper lobe adenocarcinoma. COPD. EXAM: CHEST  1 VIEW COMPARISON:  Multiple exams, including chest radiograph 09/28/2021 FINDINGS: Left upper lobe mass with known anterior rib destruction. Roughly stable right basilar airspace opacities with obscuration of portions of the right hemidiaphragm and of the right heart border indicating likely right lower lobe and right middle lobe involvement. Stable retrocardiac airspace opacity with obscuration left hemidiaphragm indicating left lower lobe involvement. Minimally reduced hilar prominence. Kerley B lines are still present for example peripherally at the right lung base, but are less striking than on the prior exam. Right-sided PICC line tip: SVC. Degenerative glenohumeral arthropathy bilaterally, left greater than right. IMPRESSION: 1. Minimally reduced Kerley B lines compared to the prior exam suggesting slight improvement in the interstitial edema. 2. Stable bibasilar airspace opacities suspicious for multilobar pneumonia. 3. Stable left upper lobe mass with associated rib destruction. Electronically Signed   By: Van Clines M.D.   On: 09/29/2021 15:14   DG Chest Port 1 View  Result Date: 09/28/2021 CLINICAL DATA:  65 year old female with respiratory failure. EXAM: PORTABLE CHEST - 1 VIEW COMPARISON:  09/22/2021, Sep 25, 2021 FINDINGS: Slightly worsened mild diffuse obscuration of the mediastinal silhouette. No cardiomegaly. Right upper extremity PICC in place with the tip near the cavoatrial junction. Interval increased bilateral hilar opacities. Similar appearing left upper lobe diffuse opacity. Increased bibasilar hazy opacities. Similar appearing obscuration of the bilateral costophrenic angles. No acute osseous abnormality. IMPRESSION: Interval increased perihilar and bibasilar opacities as could be  seen with worsening atelectasis, pulmonary edema, or infectious/inflammatory etiology superimposed upon left upper  lobe neoplasm. Similar appearing trace bilateral pleural effusions. Electronically Signed   By: Ruthann Cancer M.D.   On: 09/28/2021 08:17   DG CHEST PORT 1 VIEW  Result Date: 09/22/2021 CLINICAL DATA:  Dyspnea.  History of lung cancer. EXAM: PORTABLE CHEST 1 VIEW COMPARISON:  September 17, 2021.  September 11, 2021. FINDINGS: The heart size and mediastinal contours are within normal limits. Right-sided PICC line is unchanged in position. Stable left upper lobe opacity is noted consistent with neoplasm. Stable bibasilar atelectasis or edema is noted with small pleural effusions. No pneumothorax is noted. The visualized skeletal structures are unremarkable. IMPRESSION: Stable left upper lobe opacity is noted consistent with neoplasm. Stable bibasilar edema or atelectasis is noted with small pleural effusions. Electronically Signed   By: Marijo Conception M.D.   On: 09/22/2021 14:32   DG CHEST PORT 1 VIEW  Result Date: 09/17/2021 CLINICAL DATA:  Stage IIIA adenocarcinoma with large left upper lobe mass and chest wall invasion, undergoing chemotherapy. EXAM: PORTABLE CHEST 1 VIEW COMPARISON:  Radiograph 09/15/2021; CT chest 08/26/2021 FINDINGS: Right upper extremity PICC tip projects at the level of the upper SVC. Nodular opacity overlying the left upper lobe, consistent with left lung mass seen on CT 09/02/2021. Nodular opacity at the left hilum corresponds to the enlarged node seen on cross-sectional imaging. Diffuse interstitial thickening, similar to 09/15/2021. Small bilateral pleural effusions and increasing patchy airspace opacities in the lung bases. No pneumothorax. Heart size is difficult to assess due to adjacent consolidations but appears stable. IMPRESSION: 1. Right upper extremity PICC tip projects at the level of the upper SVC. 2. Increasing bilateral small pleural effusions and bibasilar  airspace opacities, favored to represent atelectasis, though infection is not excluded. 3. Diffuse hazy interstitial thickening is favored to reflect pulmonary edema but could also represent lymphangitic carcinomatosis. 4. No significant change in the nodular opacity in the left upper lung, corresponding to the left upper lobe mass better appreciated on prior cross-sectional imaging. Electronically Signed   By: Ileana Roup M.D.   On: 09/17/2021 11:09   DG CHEST PORT 1 VIEW  Result Date: 09/15/2021 CLINICAL DATA:  Dyspnea and respiratory abnormality EXAM: PORTABLE CHEST 1 VIEW COMPARISON:  CT chest angiography 09/13/2021 FINDINGS: The heart and mediastinal contours are within normal limits. Bibasilar patchy airspace opacities. Left upper lobe airspace opacity. Increased and coarsened interstitial markings. No pulmonary edema. Trace right pleural effusion. Possible trace left pleural effusion. No pneumothorax. No acute osseous abnormality. Degenerative changes of bilateral shoulders. IMPRESSION: 1. Interval development of bibasilar patchy airspace opacities which may represent Kahlani Graber combination of infection/inflammation and/or atelectasis. 2. Trace right pleural effusion. Possible trace left pleural effusion. 3. Left upper lobe airspace opacity consistent with known primary bronchogenic carcinoma better evaluated on CT angiography chest 09/23/2021. 4.  Emphysema (ICD10-J43.9). Electronically Signed   By: Iven Finn M.D.   On: 09/15/2021 15:08   EEG adult  Result Date: 09/12/2021 Lora Havens, MD     09/12/2021  8:19 PM Patient Name: Patricia Sampson MRN: [id number] Epilepsy Attending: Lora Havens Referring Physician/Provider: Dr Cherylann Ratel Date: 09/12/2021 Duration: 26.41 mins Patient history: 65 year old female with altered mental status.  EEG to evaluate for seizure. Level of alertness: Awake AEDs during EEG study: None Technical aspects: This EEG study was done with scalp electrodes positioned  according to the 10-20 International system of electrode placement. Electrical activity was acquired at Tiane Szydlowski sampling rate of _0  and reviewed with Tonianne Fine high frequency filter of _1   and Lacora Folmer low frequency filter of _0 . EEG data were recorded continuously and digitally stored. Description: No posterior dominant rhythm was seen. EEG showed continuous generalized sharply contoured 3 to 6 Hz theta-delta slowing, at times with triphasic morphology. Hyperventilation and photic stimulation were not performed.   ABNORMALITY - Continuous slow, generalized -Triphasic waves, generalized IMPRESSION: This study is suggestive of moderate diffuse encephalopathy, nonspecific etiology but could be secondary to toxic-metabolic causes, cefepime toxicity. No seizures or definite epileptiform discharges were seen throughout the recording. Lora Havens   ECHOCARDIOGRAM COMPLETE  Result Date: 09/14/2021    ECHOCARDIOGRAM REPORT   Patient Name:   EVETT KASSA Date of Exam: 09/14/2021 Medical Rec #:  [id number]        Height:       70.0 in Accession #:    [id number]       Weight:       164.5 lb Date of Birth:  [birth year]       BSA:          1.921 m Patient Age:    88 years         BP:           121/65 mmHg Patient Gender: F                HR:           104 bpm. Exam Location:  Inpatient Procedure: 2D Echo, Cardiac Doppler and Color Doppler Indications:    Abnormal Ekg  History:        Patient has prior history of Echocardiogram examinations, most                 recent 01/06/2017. Risk Factors:Hypertension.  Sonographer:    Helmut Muster Referring Phys: [place]  1. Left ventricular ejection fraction, by estimation, is 55 to 60%. The left ventricle has normal function. The left ventricle has no regional wall motion abnormalities. Left ventricular diastolic parameters are consistent with Grade I diastolic dysfunction (impaired relaxation).  2. There is mild respirophasic variation of interventricular septum  position, possibly due to the patient's underlying lung disease. Right ventricular systolic function is normal. The right ventricular size is normal.  3. The mitral valve is normal in structure. No evidence of mitral valve regurgitation. No evidence of mitral stenosis.  4. The aortic valve is tricuspid. Aortic valve regurgitation is not visualized. No aortic stenosis is present.  5. The inferior vena cava is dilated in size with <50% respiratory variability, suggesting right atrial pressure of 15 mmHg. FINDINGS  Left Ventricle: Left ventricular ejection fraction, by estimation, is 55 to 60%. The left ventricle has normal function. The left ventricle has no regional wall motion abnormalities. The left ventricular internal cavity size was normal in size. There is  no left ventricular hypertrophy. Left ventricular diastolic parameters are consistent with Grade I diastolic dysfunction (impaired relaxation). Right Ventricle: There is mild respirophasic variation of interventricular septum position, possibly due to the patient's underlying lung disease. The right ventricular size is normal. No increase in right ventricular wall thickness. Right ventricular systolic function is normal. Left Atrium: Left atrial size was normal in size. Right Atrium: Right atrial size was normal in size. Pericardium: There is no evidence of pericardial effusion. Mitral Valve: The mitral valve is normal in structure. No evidence of mitral valve regurgitation. No evidence of mitral valve stenosis. MV peak gradient, 4.8 mmHg. The mean mitral valve gradient is 3.0 mmHg. Tricuspid Valve: The  tricuspid valve is normal in structure. Tricuspid valve regurgitation is not demonstrated. Aortic Valve: The aortic valve is tricuspid. Aortic valve regurgitation is not visualized. No aortic stenosis is present. Pulmonic Valve: The pulmonic valve was normal in structure. Pulmonic valve regurgitation is not visualized. Aorta: The aortic root is normal in size  and structure. Venous: The inferior vena cava is dilated in size with less than 50% respiratory variability, suggesting right atrial pressure of 15 mmHg. IAS/Shunts: No atrial level shunt detected by color flow Doppler.  LEFT VENTRICLE PLAX 2D LVIDd:         5.00 cm     Diastology LVIDs:         3.40 cm     LV e' medial:    10.90 cm/s LV PW:         0.70 cm     LV E/e' medial:  8.5 LV IVS:        0.70 cm     LV e' lateral:   12.70 cm/s LVOT diam:     2.00 cm     LV E/e' lateral: 7.3 LV SV:         72 LV SV Index:   37 LVOT Area:     3.14 cm  LV Volumes (MOD) LV vol d, MOD A2C: 70.3 ml LV vol d, MOD A4C: 57.6 ml LV vol s, MOD A2C: 28.1 ml LV vol s, MOD A4C: 24.0 ml LV SV MOD A2C:     42.2 ml LV SV MOD A4C:     57.6 ml LV SV MOD BP:      39.6 ml RIGHT VENTRICLE             IVC RV S prime:     20.90 cm/s  IVC diam: 2.60 cm TAPSE (M-mode): 3.0 cm LEFT ATRIUM             Index        RIGHT ATRIUM           Index LA diam:        2.70 cm 1.41 cm/m   RA Area:     11.10 cm LA Vol (A2C):   40.0 ml 20.82 ml/m  RA Volume:   25.00 ml  13.01 ml/m LA Vol (A4C):   57.4 ml 29.88 ml/m LA Biplane Vol: 52.8 ml 27.49 ml/m  AORTIC VALVE LVOT Vmax:   146.00 cm/s LVOT Vmean:  89.300 cm/s LVOT VTI:    0.229 m  AORTA Ao Root diam: 3.40 cm Ao Asc diam:  3.50 cm MITRAL VALVE MV Area (PHT): 5.13 cm    SHUNTS MV Area VTI:   3.38 cm    Systemic VTI:  0.23 m MV Peak grad:  4.8 mmHg    Systemic Diam: 2.00 cm MV Mean grad:  3.0 mmHg MV Vmax:       1.09 m/s MV Vmean:      83.9 cm/s MV Decel Time: 148 msec MV E velocity: 92.40 cm/s MV Lenka Zhao velocity: 98.50 cm/s MV E/Finesse Fielder ratio:  0.94 Dalton McleanMD Electronically signed by Franki Monte Signature Date/Time: 09/14/2021/6:10:19 PM    Final    [place] EKG SITE RITE  Result Date: 09/15/2021 If Site Rite image not attached, placement could not be confirmed due to current cardiac rhythm.   Microbiology No results found for this or any previous visit (from the past 240 hour(s)).  Lab Basic  Metabolic Panel: No results for input(s): NA, K, CL, CO2, GLUCOSE, BUN, CREATININE, CALCIUM,  MG, PHOS in the last 168 hours. Liver Function Tests: No results for input(s): AST, ALT, ALKPHOS, BILITOT, PROT, ALBUMIN in the last 168 hours. No results for input(s): LIPASE, AMYLASE in the last 168 hours. No results for input(s): AMMONIA in the last 168 hours. CBC: No results for input(s): WBC, NEUTROABS, HGB, HCT, MCV, PLT in the last 168 hours. Cardiac Enzymes: No results for input(s): CKTOTAL, CKMB, CKMBINDEX, TROPONINI in the last 168 hours. Sepsis Labs: No results for input(s): PROCALCITON, WBC, LATICACIDVEN in the last 168 hours.  Procedures/Operations  See previous notes   Fayrene Helper 10/12/2021, 6:59 AM

## 2021-10-26 DEATH — deceased

## 2021-11-09 ENCOUNTER — Ambulatory Visit: Payer: 59 | Admitting: Cardiology

## 2021-12-05 NOTE — Progress Notes (Signed)
Radiation Oncology         [phone] ________________________________  Name: Patricia Sampson MRN: [id number]  Date: 09/28/2021  DOB: 06-Feb-1956  End of Treatment Note  Diagnosis:   Stage IIIA, cT4N1M0, NSCLC, adenocarcinoma of the LUL.     Indication for treatment: curative    Radiation treatment dates:   08/09/21-09/28/21  Site/planned dose:   The target in the LUL and regional nodes were treated to 66 Gy in 33 fractions.  Narrative: The patient's status declined during her course of radiation, from respiratory failure and she completed her radiotherapy but discussions have been for possible change in goals of care due to her respiratory function.  Plan: If the patient's situation improves we will follow up with her about a month from now.      Carola Rhine, PAC
